# Patient Record
Sex: Female | Born: 1945
Health system: Southern US, Community
[De-identification: ages and names within clinical notes are randomized; demographics above are authoritative.]

## PROBLEM LIST (undated history)

## (undated) DIAGNOSIS — M1990 Unspecified osteoarthritis, unspecified site: Secondary | ICD-10-CM

## (undated) DIAGNOSIS — T8859XA Other complications of anesthesia, initial encounter: Secondary | ICD-10-CM

## (undated) DIAGNOSIS — G4730 Sleep apnea, unspecified: Secondary | ICD-10-CM

## (undated) DIAGNOSIS — F419 Anxiety disorder, unspecified: Secondary | ICD-10-CM

## (undated) DIAGNOSIS — N183 Chronic kidney disease, stage 3 unspecified: Secondary | ICD-10-CM

## (undated) DIAGNOSIS — I10 Essential (primary) hypertension: Secondary | ICD-10-CM

## (undated) DIAGNOSIS — Z923 Personal history of irradiation: Secondary | ICD-10-CM

## (undated) DIAGNOSIS — M545 Low back pain, unspecified: Secondary | ICD-10-CM

## (undated) DIAGNOSIS — M109 Gout, unspecified: Secondary | ICD-10-CM

## (undated) DIAGNOSIS — K219 Gastro-esophageal reflux disease without esophagitis: Secondary | ICD-10-CM

## (undated) DIAGNOSIS — H409 Unspecified glaucoma: Secondary | ICD-10-CM

## (undated) DIAGNOSIS — E785 Hyperlipidemia, unspecified: Secondary | ICD-10-CM

## (undated) DIAGNOSIS — R82998 Other abnormal findings in urine: Secondary | ICD-10-CM

## (undated) DIAGNOSIS — T4145XA Adverse effect of unspecified anesthetic, initial encounter: Secondary | ICD-10-CM

## (undated) DIAGNOSIS — E119 Type 2 diabetes mellitus without complications: Secondary | ICD-10-CM

## (undated) DIAGNOSIS — Z87442 Personal history of urinary calculi: Secondary | ICD-10-CM

## (undated) DIAGNOSIS — Z9289 Personal history of other medical treatment: Secondary | ICD-10-CM

## (undated) DIAGNOSIS — L0390 Cellulitis, unspecified: Secondary | ICD-10-CM

## (undated) HISTORY — DX: Personal history of irradiation: Z92.3

## (undated) HISTORY — PX: KIDNEY STONE SURGERY: SHX686

---

## 1970-04-01 HISTORY — PX: APPENDECTOMY: SHX54

## 1970-04-01 HISTORY — PX: CHOLECYSTECTOMY: SHX55

## 1998-12-25 ENCOUNTER — Encounter: Admission: RE | Admit: 1998-12-25 | Discharge: 1999-03-25 | Payer: Self-pay | Admitting: Family Medicine

## 2000-03-21 ENCOUNTER — Encounter: Admission: RE | Admit: 2000-03-21 | Discharge: 2000-06-19 | Payer: Self-pay | Admitting: Family Medicine

## 2003-02-03 ENCOUNTER — Ambulatory Visit (HOSPITAL_COMMUNITY): Admission: RE | Admit: 2003-02-03 | Discharge: 2003-02-03 | Payer: Self-pay | Admitting: Gastroenterology

## 2003-02-03 ENCOUNTER — Encounter (INDEPENDENT_AMBULATORY_CARE_PROVIDER_SITE_OTHER): Payer: Self-pay | Admitting: *Deleted

## 2003-11-29 ENCOUNTER — Other Ambulatory Visit: Admission: RE | Admit: 2003-11-29 | Discharge: 2003-11-29 | Payer: Self-pay | Admitting: Family Medicine

## 2005-01-16 ENCOUNTER — Other Ambulatory Visit: Admission: RE | Admit: 2005-01-16 | Discharge: 2005-01-16 | Payer: Self-pay | Admitting: Family Medicine

## 2007-02-02 ENCOUNTER — Other Ambulatory Visit: Admission: RE | Admit: 2007-02-02 | Discharge: 2007-02-02 | Payer: Self-pay | Admitting: Family Medicine

## 2010-01-11 ENCOUNTER — Encounter: Admission: RE | Admit: 2010-01-11 | Discharge: 2010-01-11 | Payer: Self-pay | Admitting: Family Medicine

## 2010-01-15 ENCOUNTER — Encounter: Admission: RE | Admit: 2010-01-15 | Discharge: 2010-01-15 | Payer: Self-pay | Admitting: Family Medicine

## 2010-01-26 ENCOUNTER — Encounter: Admission: RE | Admit: 2010-01-26 | Discharge: 2010-01-26 | Payer: Self-pay | Admitting: Family Medicine

## 2010-08-17 NOTE — Op Note (Signed)
NAME:  Elizabeth Crosby, Elizabeth Crosby                          ACCOUNT NO.:  [id number]   MEDICAL RECORD NO.:  [id number]                   PATIENT TYPE:  AMB   LOCATION:  ENDO                                 FACILITY:  [facility]   PHYSICIAN:  Earle Gell, M.D.                DATE OF BIRTH:  June 16, 1945   DATE OF PROCEDURE:  02/03/2003  DATE OF DISCHARGE:                                 OPERATIVE REPORT   PROCEDURE PERFORMED:  Screening colonoscopy.   ENDOSCOPIST:  Garlan Fair, M.D.   INDICATIONS FOR PROCEDURE:  Ms. [name] is a 65 year old female born  Dec 21, 1945.  Ms. [name] is scheduled to undergo her first screening  colonoscopy with polypectomy to prevent colon cancer.   PREMEDICATION:  Versed 7.5 mg, Demerol 75 mg.   DESCRIPTION OF PROCEDURE:  After obtaining informed consent, Ms. [name] was  placed in the left lateral decubitus position.  I administered intravenous  Demerol and intravenous Versed to achieve conscious sedation for the  procedure.  The patient's blood pressure, oxygen saturations and cardiac  rhythm were monitored throughout the procedure and documented in the medical  record.   Anal inspection was normal.  Digital rectal exam was normal.  The Olympus  adjustable pediatric colonoscope was introduced into the rectum and advanced  to the cecum.  Colonic preparation for the exam today was excellent.   Rectum:  Normal.   Sigmoid colon and descending colon:  From the distal sigmoid colon at  approximately 20 to 25 cm from the anal verge, a 2 cm pedunculated polyp was  removed with the electrocautery snare and two 1 mm sessile polyps were  removed with the hot biopsy forceps.  At the pedunculated polypectomy site,  two Endoclips were applied to prevent bleeding.   Splenic flexure:  Normal.   Transverse colon:  Normal.   Hepatic flexure:  Normal.   Ascending colon:  Normal.   Cecum and ileocecal valve:  From the proximal cecum, adjacent to the  appendiceal orifice, a 2 to 3 mm sessile polyp was removed with the  electrocautery snare and submitted for pathological interpretation.    ASSESSMENT:  1. From the proximal cecum, a 2 to 3 mm sessile polyp was removed with     electrocautery snare.  2. From the distal sigmoid colon, a large pedunculated polyp was removed     with the electrocautery snare and two small sessile polyps were removed     with the hot biopsy forceps.                                               Earle Gell, M.D.    MJ/MEDQ  D:  02/03/2003  T:  02/03/2003  Job:  [id number]   cc:   Lenon Curt  Roderick Pee, M.D.  [address] [place]  [place] [address]  Fax: [phone]

## 2010-09-04 ENCOUNTER — Ambulatory Visit: Payer: Self-pay | Admitting: Physical Therapy

## 2010-12-17 ENCOUNTER — Other Ambulatory Visit: Payer: Self-pay | Admitting: Family Medicine

## 2010-12-17 ENCOUNTER — Ambulatory Visit
Admission: RE | Admit: 2010-12-17 | Discharge: 2010-12-17 | Disposition: A | Discharge status: Home / Self Care | Payer: Managed Care, Other (non HMO) | Source: Ambulatory Visit | Attending: Family Medicine | Admitting: Family Medicine

## 2010-12-17 DIAGNOSIS — M25579 Pain in unspecified ankle and joints of unspecified foot: Secondary | ICD-10-CM

## 2011-05-16 DIAGNOSIS — E782 Mixed hyperlipidemia: Secondary | ICD-10-CM | POA: Diagnosis not present

## 2011-05-16 DIAGNOSIS — R252 Cramp and spasm: Secondary | ICD-10-CM | POA: Diagnosis not present

## 2011-05-16 DIAGNOSIS — E119 Type 2 diabetes mellitus without complications: Secondary | ICD-10-CM | POA: Diagnosis not present

## 2011-05-20 DIAGNOSIS — S93409A Sprain of unspecified ligament of unspecified ankle, initial encounter: Secondary | ICD-10-CM | POA: Diagnosis not present

## 2011-06-04 DIAGNOSIS — J309 Allergic rhinitis, unspecified: Secondary | ICD-10-CM | POA: Diagnosis not present

## 2011-08-23 DIAGNOSIS — J209 Acute bronchitis, unspecified: Secondary | ICD-10-CM | POA: Diagnosis not present

## 2011-11-11 DIAGNOSIS — E782 Mixed hyperlipidemia: Secondary | ICD-10-CM | POA: Diagnosis not present

## 2011-11-11 DIAGNOSIS — IMO0001 Reserved for inherently not codable concepts without codable children: Secondary | ICD-10-CM | POA: Diagnosis not present

## 2011-11-11 DIAGNOSIS — I10 Essential (primary) hypertension: Secondary | ICD-10-CM | POA: Diagnosis not present

## 2011-12-10 DIAGNOSIS — J209 Acute bronchitis, unspecified: Secondary | ICD-10-CM | POA: Diagnosis not present

## 2012-02-17 DIAGNOSIS — Z1331 Encounter for screening for depression: Secondary | ICD-10-CM | POA: Diagnosis not present

## 2012-02-17 DIAGNOSIS — E1129 Type 2 diabetes mellitus with other diabetic kidney complication: Secondary | ICD-10-CM | POA: Diagnosis not present

## 2012-02-17 DIAGNOSIS — R809 Proteinuria, unspecified: Secondary | ICD-10-CM | POA: Diagnosis not present

## 2012-02-17 DIAGNOSIS — R071 Chest pain on breathing: Secondary | ICD-10-CM | POA: Diagnosis not present

## 2012-02-17 DIAGNOSIS — N183 Chronic kidney disease, stage 3 unspecified: Secondary | ICD-10-CM | POA: Diagnosis not present

## 2012-07-22 ENCOUNTER — Other Ambulatory Visit: Payer: Self-pay | Admitting: Gastroenterology

## 2012-08-13 ENCOUNTER — Encounter (HOSPITAL_COMMUNITY): Payer: Self-pay | Admitting: *Deleted

## 2012-08-17 ENCOUNTER — Encounter (HOSPITAL_COMMUNITY): Payer: Self-pay | Admitting: Pharmacy Technician

## 2012-08-18 DIAGNOSIS — R350 Frequency of micturition: Secondary | ICD-10-CM | POA: Diagnosis not present

## 2012-09-01 ENCOUNTER — Encounter (HOSPITAL_COMMUNITY): Payer: Self-pay | Admitting: Anesthesiology

## 2012-09-01 ENCOUNTER — Ambulatory Visit (HOSPITAL_COMMUNITY): Payer: BC Managed Care – PPO | Admitting: Anesthesiology

## 2012-09-01 ENCOUNTER — Encounter (HOSPITAL_COMMUNITY): Payer: Self-pay

## 2012-09-01 ENCOUNTER — Encounter (HOSPITAL_COMMUNITY)
Admission: RE | Disposition: A | Discharge status: Home / Self Care | Payer: Self-pay | Source: Ambulatory Visit | Attending: Gastroenterology

## 2012-09-01 ENCOUNTER — Ambulatory Visit (HOSPITAL_COMMUNITY)
Admission: RE | Admit: 2012-09-01 | Discharge: 2012-09-01 | Disposition: A | Discharge status: Home / Self Care | Payer: BC Managed Care – PPO | Source: Ambulatory Visit | Attending: Gastroenterology | Admitting: Gastroenterology

## 2012-09-01 DIAGNOSIS — R197 Diarrhea, unspecified: Secondary | ICD-10-CM | POA: Diagnosis not present

## 2012-09-01 DIAGNOSIS — G4733 Obstructive sleep apnea (adult) (pediatric): Secondary | ICD-10-CM | POA: Insufficient documentation

## 2012-09-01 DIAGNOSIS — D126 Benign neoplasm of colon, unspecified: Secondary | ICD-10-CM | POA: Diagnosis not present

## 2012-09-01 DIAGNOSIS — Z79899 Other long term (current) drug therapy: Secondary | ICD-10-CM | POA: Diagnosis not present

## 2012-09-01 DIAGNOSIS — E119 Type 2 diabetes mellitus without complications: Secondary | ICD-10-CM | POA: Insufficient documentation

## 2012-09-01 DIAGNOSIS — K5730 Diverticulosis of large intestine without perforation or abscess without bleeding: Secondary | ICD-10-CM | POA: Insufficient documentation

## 2012-09-01 DIAGNOSIS — I10 Essential (primary) hypertension: Secondary | ICD-10-CM | POA: Insufficient documentation

## 2012-09-01 DIAGNOSIS — E7800 Pure hypercholesterolemia, unspecified: Secondary | ICD-10-CM | POA: Diagnosis not present

## 2012-09-01 DIAGNOSIS — K219 Gastro-esophageal reflux disease without esophagitis: Secondary | ICD-10-CM | POA: Insufficient documentation

## 2012-09-01 HISTORY — DX: Personal history of other medical treatment: Z92.89

## 2012-09-01 HISTORY — DX: Type 2 diabetes mellitus without complications: E11.9

## 2012-09-01 HISTORY — DX: Gastro-esophageal reflux disease without esophagitis: K21.9

## 2012-09-01 HISTORY — PX: COLONOSCOPY WITH PROPOFOL: SHX5780

## 2012-09-01 HISTORY — DX: Essential (primary) hypertension: I10

## 2012-09-01 SURGERY — COLONOSCOPY WITH PROPOFOL
Anesthesia: Monitor Anesthesia Care

## 2012-09-01 MED ORDER — SODIUM CHLORIDE 0.9 % IV SOLN
INTRAVENOUS | Status: DC
  Administered 2012-09-01: 1000 mL via INTRAVENOUS

## 2012-09-01 MED ORDER — KETAMINE HCL 50 MG/ML IJ SOLN
INTRAMUSCULAR | Status: DC | PRN
  Administered 2012-09-01: 10 mg via INTRAMUSCULAR
  Administered 2012-09-01: 15 mg via INTRAMUSCULAR

## 2012-09-01 MED ORDER — PROPOFOL INFUSION 10 MG/ML OPTIME
INTRAVENOUS | Status: DC | PRN
  Administered 2012-09-01: 75 ug/kg/min via INTRAVENOUS

## 2012-09-01 MED ORDER — PROPOFOL 10 MG/ML IV BOLUS
INTRAVENOUS | Status: DC | PRN
  Administered 2012-09-01 (×4): 20 mg via INTRAVENOUS

## 2012-09-01 SURGICAL SUPPLY — 21 items

## 2012-09-01 NOTE — Anesthesia Preprocedure Evaluation (Addendum)
Anesthesia Evaluation  Patient identified by MRN, date of birth, ID band Patient awake    Reviewed: Allergy & Precautions, H&P , NPO status , Patient's Chart, lab work & pertinent test results  Airway Mallampati: II TM Distance: >3 FB Neck ROM: Full    Dental  (+) Teeth Intact, Caps and Dental Advisory Given   Pulmonary neg pulmonary ROS,  breath sounds clear to auscultation        Cardiovascular hypertension, Pt. on medications negative cardio ROS  Rhythm:Regular Rate:Normal + Systolic murmurs    Neuro/Psych negative neurological ROS  negative psych ROS   GI/Hepatic negative GI ROS, Neg liver ROS, GERD-  Medicated,  Endo/Other  negative endocrine ROSdiabetes, Type 2, Insulin Dependent and Oral Hypoglycemic Agents  Renal/GU negative Renal ROS  negative genitourinary   Musculoskeletal negative musculoskeletal ROS (+)   Abdominal   Peds negative pediatric ROS (+)  Hematology negative hematology ROS (+)   Anesthesia Other Findings   Reproductive/Obstetrics negative OB ROS                          Anesthesia Physical Anesthesia Plan  ASA: III  Anesthesia Plan: MAC   Post-op Pain Management:    Induction: Intravenous  Airway Management Planned: Simple Face Mask  Additional Equipment:   Intra-op Plan:   Post-operative Plan:   Informed Consent: I have reviewed the patients History and Physical, chart, labs and discussed the procedure including the risks, benefits and alternatives for the proposed anesthesia with the patient or authorized representative who has indicated his/her understanding and acceptance.   Dental advisory given  Plan Discussed with: CRNA  Anesthesia Plan Comments:         Anesthesia Quick Evaluation

## 2012-09-01 NOTE — Op Note (Signed)
Procedure: Surveillance colonoscopy with random colon biopsies to rule out microscopic colitis to evaluate chronic diarrhea on metformin. A  1.5 cm tubulovillous adenomatous polyp removed from the sigmoid colon 2004.  Endoscopist: Earle Gell  Premedication: Propofol administered by anesthesia  Procedure: The patient was placed in the left lateral decubitus position. Anal inspection and digital rectal exam were normal. The Pentax pediatric colonoscope was introduced into the rectum and advanced to the cecum. A normal-appearing ileocecal valve and appendiceal orifice were identified. Colonic preparation for the exam today was good.  Rectum. Normal. Retroflex view of the distal rectum normal.  Sigmoid colon and descending colon. Left colonic diverticulosis. From the proximal descending colon, a 4 mm sessile polyp was removed with the cold snare and cold biopsy forceps and submitted for pathologic interpretation.  Splenic flexure. Normal.  Transverse colon. Normal.  Hepatic flexure. Normal.  Ascending colon. Normal.  Cecum and ileocecal valve. Normal.  Random colon biopsies. Biopsies were performed from the right colon and left colon to look for microscopic colitis.  Assessment:  #1. From the proximal descending colon, a 4 mm sessile polyp was removed with the cold snare and cold biopsy forceps.  #2. Random colon biopsies were performed from the right colon and left colon to rule out microscopic colitis.  #3. Schedule repeat surveillance colonoscopy in 5 years.

## 2012-09-01 NOTE — H&P (Signed)
  Procedure: Surveillance colonoscopy.  History: The patient is a 67 year old female born 08-11-1945. In November 2004, a 1.5 cm tubulovillous adenomatous polyp was removed from the sigmoid colon colonoscopically. In December 2008, the patient underwent a normal surveillance colonoscopy.  The patient is scheduled to undergo a surveillance colonoscopy today.  The patient has intermittent watery, nonbloody diarrhea. I will perform random colon biopsies to rule out microscopic colitis.  Chronic medications: Atorvastatin. Metformin. Lantus insulin. NovoLog insulin. Ventolin. Zyrtec. Fluticasone. Fenofibrate. Omeprazole. Losartan. Hydrochlorothiazide. Aspirin. Amlodipine.  Past medical and surgical history: Appendectomy. Cholecystectomy. Type 2 diabetes mellitus with proteinuria. Hypertension. Hypercholesterolemia. Gastroesophageal reflux. Obesity. Anxiety. Glaucoma. Obstructive sleep apnea syndrome.  Medication allergies: Sulfa. Lipitor. Lexapro.  Exam: The patient is alert and lying comfortably on the endoscopy stretcher. Abdomen is soft and nontender to palpation. Lungs are clear to auscultation. Cardiac exam reveals a regular rhythm.  Plan: Proceed with surveillance colonoscopy.

## 2012-09-01 NOTE — Transfer of Care (Signed)
Immediate Anesthesia Transfer of Care Note  Patient: Elizabeth Crosby  Procedure(s) Performed: Procedure(s) (LRB): COLONOSCOPY WITH PROPOFOL (N/A)  Patient Location: PACU  Anesthesia Type: MAC  Level of Consciousness: sedated, patient cooperative and responds to stimulaton  Airway & Oxygen Therapy: Patient Spontanous Breathing and Patient connected to face mask oxgen  Post-op Assessment: Report given to PACU RN and Post -op Vital signs reviewed and stable  Post vital signs: Reviewed and stable  Complications: No apparent anesthesia complications

## 2012-09-02 ENCOUNTER — Encounter (HOSPITAL_COMMUNITY): Payer: Self-pay | Admitting: Gastroenterology

## 2012-09-02 NOTE — Anesthesia Postprocedure Evaluation (Signed)
Anesthesia Post Note  Patient: Elizabeth Crosby  Procedure(s) Performed: Procedure(s) (LRB): COLONOSCOPY WITH PROPOFOL (N/A)  Anesthesia type: MAC  Patient location: PACU  Post pain: Pain level controlled  Post assessment: Post-op Vital signs reviewed  Last Vitals:  Filed Vitals:   09/01/12 1030  BP: 134/62  Temp:   Resp: 14    Post vital signs: Reviewed  Level of consciousness: sedated  Complications: No apparent anesthesia complications

## 2012-12-17 DIAGNOSIS — E782 Mixed hyperlipidemia: Secondary | ICD-10-CM | POA: Diagnosis not present

## 2012-12-17 DIAGNOSIS — N183 Chronic kidney disease, stage 3 unspecified: Secondary | ICD-10-CM | POA: Diagnosis not present

## 2012-12-17 DIAGNOSIS — I129 Hypertensive chronic kidney disease with stage 1 through stage 4 chronic kidney disease, or unspecified chronic kidney disease: Secondary | ICD-10-CM | POA: Diagnosis not present

## 2012-12-17 DIAGNOSIS — Z6841 Body Mass Index (BMI) 40.0 and over, adult: Secondary | ICD-10-CM | POA: Diagnosis not present

## 2012-12-17 DIAGNOSIS — F411 Generalized anxiety disorder: Secondary | ICD-10-CM | POA: Diagnosis not present

## 2012-12-17 DIAGNOSIS — E1129 Type 2 diabetes mellitus with other diabetic kidney complication: Secondary | ICD-10-CM | POA: Diagnosis not present

## 2012-12-17 DIAGNOSIS — G5600 Carpal tunnel syndrome, unspecified upper limb: Secondary | ICD-10-CM | POA: Diagnosis not present

## 2012-12-30 DIAGNOSIS — Z23 Encounter for immunization: Secondary | ICD-10-CM | POA: Diagnosis not present

## 2013-02-09 ENCOUNTER — Other Ambulatory Visit: Payer: Self-pay | Admitting: Physician Assistant

## 2013-02-09 ENCOUNTER — Ambulatory Visit
Admission: RE | Admit: 2013-02-09 | Discharge: 2013-02-09 | Disposition: A | Discharge status: Home / Self Care | Payer: BC Managed Care – PPO | Source: Ambulatory Visit | Attending: Physician Assistant | Admitting: Physician Assistant

## 2013-02-09 DIAGNOSIS — J40 Bronchitis, not specified as acute or chronic: Secondary | ICD-10-CM | POA: Diagnosis not present

## 2013-02-09 DIAGNOSIS — R05 Cough: Secondary | ICD-10-CM | POA: Diagnosis not present

## 2013-02-09 DIAGNOSIS — R059 Cough, unspecified: Secondary | ICD-10-CM

## 2013-05-06 ENCOUNTER — Encounter: Payer: Self-pay | Admitting: Internal Medicine

## 2013-05-06 ENCOUNTER — Ambulatory Visit (INDEPENDENT_AMBULATORY_CARE_PROVIDER_SITE_OTHER): Payer: BC Managed Care – PPO | Admitting: Internal Medicine

## 2013-05-06 ENCOUNTER — Other Ambulatory Visit: Payer: BC Managed Care – PPO

## 2013-05-06 VITALS — BP 126/78 | HR 91 | Ht 62.0 in | Wt 230.6 lb

## 2013-05-06 DIAGNOSIS — J40 Bronchitis, not specified as acute or chronic: Secondary | ICD-10-CM | POA: Diagnosis not present

## 2013-05-06 DIAGNOSIS — J209 Acute bronchitis, unspecified: Secondary | ICD-10-CM | POA: Diagnosis not present

## 2013-05-06 DIAGNOSIS — K219 Gastro-esophageal reflux disease without esophagitis: Secondary | ICD-10-CM

## 2013-05-06 MED ORDER — FLUTICASONE FUROATE-VILANTEROL 100-25 MCG/INH IN AEPB: 1.0000 | INHALATION_SPRAY | Freq: Once | RESPIRATORY_TRACT | Status: DC

## 2013-05-06 NOTE — Progress Notes (Signed)
05/06/13- 59 yoF never smoker referred courtesy of Dr Earle Gell to rule out asthmatic bronchitis Patient describes bronchitis almost every year. This past year has been unusual in that cough with bronchitis began in November and has persisted. Describes dry cough with little wheeze. She took Z-Pak and amoxicillin in December. No fever or purulent sputum. Takes Zyrtec every day around the year for sneezing and watery eyes. Albuterol inhaler not much help. Dyspnea on exertion with hills and stairs. Never diagnosed asthma. Has had pneumonia and pneumonia vaccine.  Much reflux in the past now treated with omeprazole twice daily. Dr. Wynetta Emery does not feel reflux is likely to be causing the cough. Dexilant poorly tolerated. 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 CXR 02/09/13 IMPRESSION:  Slight bronchitic changes.  Electronically Signed  By: Rozetta Nunnery M.D.  On: 02/09/2013 15:49  Prior to Admission medications   Medication Sig Start Date End Date Taking? Authorizing Provider  acetaminophen (TYLENOL) 500 MG tablet Take 1,000 mg by mouth every 6 (six) hours as needed for pain.   Yes Historical Provider, MD  amLODipine (NORVASC) 5 MG tablet Take 5 mg by mouth every morning.    Yes Historical Provider, MD  aspirin EC 81 MG tablet Take 81 mg by mouth every morning.   Yes Historical Provider, MD  atorvastatin (LIPITOR) 80 MG tablet Take 80 mg by mouth every morning.   Yes Historical Provider, MD  Canagliflozin (INVOKANA) 300 MG TABS Take 300 mg by mouth every morning.    Yes Historical Provider, MD  cetirizine (ZYRTEC) 10 MG tablet Take 10 mg by mouth at bedtime.   Yes Historical Provider, MD  Cholecalciferol (VITAMIN D3) 2000 UNITS TABS Take 2,000 Units by mouth every morning.   Yes Historical Provider, MD  diazepam (VALIUM) 2 MG tablet Take 2 mg by mouth every 8 (eight) hours as needed for anxiety.   Yes Historical Provider, MD  fenofibrate 160 MG  tablet Take 160 mg by mouth every morning.    Yes Historical Provider, MD  insulin aspart (NOVOLOG) 100 UNIT/ML injection Inject into the skin 3 (three) times daily before meals. Sliding scale based on 1/3 of toal carbs eaten with meals   Yes Historical Provider, MD  insulin glargine (LANTUS) 100 UNIT/ML injection Inject 40 Units into the skin at bedtime.   Yes Historical Provider, MD  Liraglutide (VICTOZA) 18 MG/3ML SOPN Inject 0.6 Units into the skin daily.   Yes Historical Provider, MD  losartan-hydrochlorothiazide (HYZAAR) 100-25 MG per tablet Take 0.5 tablets by mouth every morning.   Yes Historical Provider, MD  metFORMIN (GLUCOPHAGE) 850 MG tablet Take 850 mg by mouth daily with breakfast.    Yes Historical Provider, MD  Multiple Vitamin (MULTIVITAMIN) tablet Take 1 tablet by mouth every morning.    Yes Historical Provider, MD  omeprazole (PRILOSEC) 20 MG capsule Take 20 mg by mouth 2 (two) times daily.   Yes Historical Provider, MD  Polyethyl Glycol-Propyl Glycol (SYSTANE) 0.4-0.3 % SOLN Place 1 drop into both eyes daily as needed (For dry eyes.).   Yes Historical Provider, MD  SYMBICORT 80-4.5 MCG/ACT inhaler Inhale 2 puffs into the lungs 2 (two) times daily. 03/03/13  Yes Historical Provider, MD  vitamin C (ASCORBIC ACID) 500 MG tablet Take 500 mg by mouth every morning.    Yes Historical Provider, MD  albuterol (PROVENTIL HFA;VENTOLIN HFA) 108 (90 BASE) MCG/ACT inhaler Inhale 2 puffs into the lungs every 4 (four) hours  as needed for wheezing.    Historical Provider, MD  Fluticasone Furoate-Vilanterol (BREO ELLIPTA) 100-25 MCG/INH AEPB Inhale 1 puff into the lungs once. 05/06/13   Elizabeth Lever, MD   Past Medical History  Diagnosis Date  . Hypertension   . GERD (gastroesophageal reflux disease)   . History of blood transfusion     as child after accident, after childbirth/hemorroid surgery  . Diabetes mellitus without complication    Past Surgical History  Procedure Laterality Date   . Cholecystectomy    . Appendectomy    . Colonoscopy with propofol N/A 09/01/2012    Procedure: COLONOSCOPY WITH PROPOFOL;  Surgeon: Garlan Fair, MD;  Location: [facility];  Service: Endoscopy;  Laterality: N/A;   Family History  Problem Relation Age of Onset  . Stroke Mother   . Asthma Father   . Colon cancer Father    History   Social History  . Marital Status: Married    Spouse Name: N/A    Number of Children: N/A  . Years of Education: N/A   Occupational History  . Not on file.   Social History Main Topics  . Smoking status: Never Smoker   . Smokeless tobacco: Never Used  . Alcohol Use: No  . Drug Use: No  . Sexual Activity: Not on file   Other Topics Concern  . Not on file   Social History Narrative  . No narrative on file   ROS-see HPI Constitutional:   No-   weight loss, night sweats, fevers, chills, fatigue, lassitude. HEENT:   No-  headaches, difficulty swallowing, tooth/dental problems, sore throat,       No-  sneezing, itching, ear ache, nasal congestion, post nasal drip,  CV:  No-   chest pain, orthopnea, PND, swelling in lower extremities, anasarca,                                  dizziness, palpitations Resp: +  shortness of breath with exertion or at rest.              + productive cough,  + non-productive cough,  No- coughing up of blood.              No-   change in color of mucus.  No- wheezing.   Skin: No-   rash or lesions. GI:  +  heartburn, +indigestion, no-abdominal pain, nausea, vomiting, diarrhea,                 change in bowel habits, loss of appetite GU: No-   dysuria, change in color of urine, no urgency or frequency.  No- flank pain. MS:  + joint pain or swelling.  No- decreased range of motion.  No- back pain. Neuro-     nothing unusual Psych:  No- change in mood or affect. No depression or anxiety.  No memory loss.  OBJ- Physical Exam General- Alert, Oriented, Affect-appropriate, Distress- none acute, overweight Skin-  rash-none, lesions- none, excoriation- none Lymphadenopathy- none Head- atraumatic            Eyes- Gross vision intact, PERRLA, conjunctivae and secretions clear            Ears- Hearing, canals-normal            Nose- Clear, no-Septal dev, mucus, polyps, erosion, perforation             Throat- Mallampati III , mucosa  clear , drainage- none, tonsils- atrophic Neck- flexible , trachea midline, no stridor , thyroid nl, carotid no bruit Chest - symmetrical excursion , unlabored           Heart/CV- RRR , no murmur heard today , no gallop  , no rub, nl s1 s2                           - JVD- none , edema- none, stasis changes- none, varices- none           Lung- clear to P&A, wheeze- none, cough+dry with deep breath , dullness-none, rub- none           Chest wall-  Abd- tender-no, distended-no, bowel sounds-present, HSM- no Br/ Gen/ Rectal- Not done, not indicated Extrem- cyanosis- none, clubbing, none, atrophy- none, strength- nl Neuro- grossly intact to observation

## 2013-05-06 NOTE — Patient Instructions (Signed)
Order- schedule PFT dx Bronchitis  Order lab- Allergy profile  Sample Breo Ellipta inhaler  1 puff, then rinse mouth, twice daily

## 2013-05-07 LAB — ALLERGY FULL PROFILE
Allergen, D pternoyssinus,d7: <0.1 kU/L
Bahia Grass: <0.1 kU/L
Box Elder IgE: <0.1 kU/L
Cat Dander: 0.22 kU/L — ABNORMAL HIGH
Common Ragweed: <0.1 kU/L
Dog Dander: <0.1 kU/L
G005 Rye, Perennial: <0.1 kU/L
Goldenrod: <0.1 kU/L
Helminthosporium halodes: <0.1 kU/L
House Dust Hollister: <0.1 kU/L
IGE (IMMUNOGLOBULIN E), SERUM: 14.5 [IU]/mL (ref 0.0–180.0)
Sycamore Tree: <0.1 kU/L

## 2013-05-29 ENCOUNTER — Encounter: Payer: Self-pay | Admitting: Internal Medicine

## 2013-05-29 DIAGNOSIS — K219 Gastro-esophageal reflux disease without esophagitis: Secondary | ICD-10-CM | POA: Insufficient documentation

## 2013-05-29 DIAGNOSIS — J209 Acute bronchitis, unspecified: Secondary | ICD-10-CM | POA: Insufficient documentation

## 2013-05-29 NOTE — Assessment & Plan Note (Signed)
History of recurrent acute bronchitis, more persistent this year. Known reflux symptomatically controlled. Relation to cough not clear. Plan-sample Breo Ellipta, schedule PFT, lab allergy profile

## 2013-05-29 NOTE — Assessment & Plan Note (Signed)
controlled 

## 2013-06-17 ENCOUNTER — Ambulatory Visit (INDEPENDENT_AMBULATORY_CARE_PROVIDER_SITE_OTHER): Payer: BC Managed Care – PPO | Admitting: Internal Medicine

## 2013-06-17 ENCOUNTER — Encounter: Payer: Self-pay | Admitting: Internal Medicine

## 2013-06-17 VITALS — BP 140/72 | HR 102 | Ht 61.0 in | Wt 232.0 lb

## 2013-06-17 DIAGNOSIS — J40 Bronchitis, not specified as acute or chronic: Secondary | ICD-10-CM | POA: Diagnosis not present

## 2013-06-17 DIAGNOSIS — J00 Acute nasopharyngitis [common cold]: Secondary | ICD-10-CM

## 2013-06-17 DIAGNOSIS — J209 Acute bronchitis, unspecified: Secondary | ICD-10-CM | POA: Diagnosis not present

## 2013-06-17 LAB — PULMONARY FUNCTION TEST
DL/VA % pred: 127 %
DL/VA: 5.63 ml/min/mmHg/L
DLCO UNC: 17.27 ml/min/mmHg
DLCO unc % pred: 85 %
FEF 25-75 Post: 2.98 L/sec
FEF 25-75 Pre: 3.1 L/sec
FEF2575-%Change-Post: -3 %
FEF2575-%Pred-Post: 160 %
FEF2575-%Pred-Pre: 167 %
FEV1-%Change-Post: 2 %
FEV1-%PRED-POST: 90 %
FEV1-%Pred-Pre: 88 %
FEV1-POST: 1.88 L
FEV1-Pre: 1.82 L
FEV1FVC-%CHANGE-POST: 2 %
FEV1FVC-%Pred-Pre: 117 %
FEV6-%Change-Post: 0 %
FEV6-%PRED-PRE: 78 %
FEV6-%Pred-Post: 79 %
FEV6-POST: 2.06 L
FEV6-PRE: 2.04 L
FEV6FVC-%CHANGE-POST: 0 %
FEV6FVC-%PRED-POST: 104 %
FEV6FVC-%PRED-PRE: 103 %
FVC-%Change-Post: 0 %
FVC-%PRED-PRE: 75 %
FVC-%Pred-Post: 75 %
FVC-PRE: 2.05 L
FVC-Post: 2.06 L
POST FEV6/FVC RATIO: 100 %
PRE FEV6/FVC RATIO: 100 %
Post FEV1/FVC ratio: 91 %
Pre FEV1/FVC ratio: 89 %
RV % PRED: 54 %
RV: 1.09 L
TLC % PRED: 72 %
TLC: 3.36 L

## 2013-06-17 MED ORDER — ALBUTEROL SULFATE HFA 108 (90 BASE) MCG/ACT IN AERS: 2.0000 | INHALATION_SPRAY | RESPIRATORY_TRACT | Status: DC | PRN

## 2013-06-17 NOTE — Patient Instructions (Signed)
Ok to take an otc antihistamine for allergic nose symptoms if needed  Script sent for albuterol rescue inhaler to use if needed  Ok to continue Symbicort while needed  Consider the environmental dust and pollen precaution information we gave you, including possibly trying a HEPA filter air-cleaner in your bedroom.

## 2013-06-17 NOTE — Progress Notes (Signed)
PFT done today. 

## 2013-06-17 NOTE — Progress Notes (Signed)
05/06/13- 40 yoF never smoker referred courtesy of Dr Earle Gell to rule out asthmatic bronchitis Patient describes bronchitis almost every year. This past year has been unusual in that cough with bronchitis began in November and has persisted. Describes dry cough with little wheeze. She took Z-Pak and amoxicillin in December. No fever or purulent sputum. Takes Zyrtec every day around the year for sneezing and watery eyes. Albuterol inhaler not much help. Dyspnea on exertion with hills and stairs. Never diagnosed asthma. Has had pneumonia and pneumonia vaccine.  Much reflux in the past now treated with omeprazole twice daily. Dr. Wynetta Emery does not feel reflux is likely to be causing the cough. Dexilant poorly tolerated. 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 CXR 02/09/13 IMPRESSION:  Slight bronchitic changes.  Electronically Signed  By: Rozetta Nunnery M.D.  On: 02/09/2013 15:49  06/17/13- 54 yoF never smoker followed for recurrent bronchitis, complicated by GERD FOLLOWS FOR: review PFT and allergy lab results with patient. Pollen causing spring allergy complaints with watery eyes, periorbital edema, sneezing, minor cough. Allergy Profile 05/06/13- total IgE 14.5, elevation only for cat (has 2 cats) PFT: 06/17/2013- minimal restriction, normal flows within significant response to bronchodilator, normal diffusion. FEC 2.06/75%, FEV1 1.88/90%, FEV1/FVC 0.91, FEF 25-75% 2.98/160%, TLC 72%, DLCO 85%.  ROS-see HPI Constitutional:   No-   weight loss, night sweats, fevers, chills, fatigue, lassitude. HEENT:   No-  headaches, difficulty swallowing, tooth/dental problems, sore throat,       + sneezing, itching, ear ache, +nasal congestion, post nasal drip,  CV:  No-   chest pain, orthopnea, PND, swelling in lower extremities, anasarca,                                  dizziness, palpitations Resp: +  shortness of breath with exertion or at rest.         No- productive cough,  + non-productive cough,  No- coughing up of blood.              No-   change in color of mucus.  No- wheezing.   Skin: No-   rash or lesions. GI:  +  heartburn, no-indigestion, no-abdominal pain, nausea, vomiting,  GU: No-   dysuria, change in color of urine, no urgency or frequency.  No- flank pain. MS:  + joint pain or swelling.  No- decreased range of motion.  No- back pain. Neuro-     nothing unusual Psych:  No- change in mood or affect. No depression or anxiety.  No memory loss.  OBJ- Physical Exam General- Alert, Oriented, Affect-appropriate, Distress- none acute, +overweight Skin- rash-none, lesions- none, excoriation- none Lymphadenopathy- none Head- atraumatic            Eyes- Gross vision intact, PERRLA, conjunctivae and secretions clear            Ears- Hearing, canals-normal            Nose- Clear, no-Septal dev, mucus, polyps, erosion, perforation             Throat- Mallampati III , mucosa clear , drainage- none, tonsils- atrophic Neck- flexible , trachea midline, no stridor , thyroid nl, carotid no bruit Chest - symmetrical excursion , unlabored           Heart/CV- RRR , no murmur heard today , no gallop  , no rub, nl s1  s2                           - JVD- none , edema- none, stasis changes- none, varices- none           Lung- clear to P&A, wheeze- none, cough+dry with deep breath , dullness-none, rub- none           Chest wall-  Abd-  Br/ Gen/ Rectal- Not done, not indicated Extrem- cyanosis- none, clubbing, none, atrophy- none, +cane Neuro- grossly intact to observation

## 2013-07-04 DIAGNOSIS — J00 Acute nasopharyngitis [common cold]: Secondary | ICD-10-CM | POA: Insufficient documentation

## 2013-07-04 NOTE — Assessment & Plan Note (Signed)
Mild allergic asthmatic bronchitis at most

## 2013-07-04 NOTE — Assessment & Plan Note (Signed)
She has 2 cats but we would expect this to be a perennial trigger. Acute symptoms now suggest she is responding to current tree pollens, although these were not elevated on her allergy profile. Plan-antihistamines, discussed available nasal sprays, consider HEPA air cleaner

## 2013-12-13 ENCOUNTER — Ambulatory Visit: Payer: BC Managed Care – PPO | Admitting: Internal Medicine

## 2013-12-29 DIAGNOSIS — Z6841 Body Mass Index (BMI) 40.0 and over, adult: Secondary | ICD-10-CM | POA: Diagnosis not present

## 2013-12-29 DIAGNOSIS — E782 Mixed hyperlipidemia: Secondary | ICD-10-CM | POA: Diagnosis not present

## 2013-12-29 DIAGNOSIS — N183 Chronic kidney disease, stage 3 unspecified: Secondary | ICD-10-CM | POA: Diagnosis not present

## 2013-12-29 DIAGNOSIS — I129 Hypertensive chronic kidney disease with stage 1 through stage 4 chronic kidney disease, or unspecified chronic kidney disease: Secondary | ICD-10-CM | POA: Diagnosis not present

## 2013-12-29 DIAGNOSIS — F411 Generalized anxiety disorder: Secondary | ICD-10-CM | POA: Diagnosis not present

## 2013-12-29 DIAGNOSIS — R55 Syncope and collapse: Secondary | ICD-10-CM | POA: Diagnosis not present

## 2013-12-29 DIAGNOSIS — Z23 Encounter for immunization: Secondary | ICD-10-CM | POA: Diagnosis not present

## 2013-12-29 DIAGNOSIS — N058 Unspecified nephritic syndrome with other morphologic changes: Secondary | ICD-10-CM | POA: Diagnosis not present

## 2013-12-29 DIAGNOSIS — E1129 Type 2 diabetes mellitus with other diabetic kidney complication: Secondary | ICD-10-CM | POA: Diagnosis not present

## 2013-12-30 ENCOUNTER — Ambulatory Visit (HOSPITAL_COMMUNITY): Payer: BC Managed Care – PPO | Attending: Family Medicine | Admitting: Radiology

## 2013-12-30 ENCOUNTER — Other Ambulatory Visit (HOSPITAL_COMMUNITY): Payer: Self-pay | Admitting: Family Medicine

## 2013-12-30 DIAGNOSIS — I10 Essential (primary) hypertension: Secondary | ICD-10-CM | POA: Insufficient documentation

## 2013-12-30 DIAGNOSIS — R55 Syncope and collapse: Secondary | ICD-10-CM

## 2013-12-30 DIAGNOSIS — E119 Type 2 diabetes mellitus without complications: Secondary | ICD-10-CM | POA: Diagnosis not present

## 2013-12-30 DIAGNOSIS — E785 Hyperlipidemia, unspecified: Secondary | ICD-10-CM | POA: Diagnosis not present

## 2013-12-30 NOTE — Progress Notes (Signed)
Echocardiogram performed.  

## 2014-01-03 DIAGNOSIS — E119 Type 2 diabetes mellitus without complications: Secondary | ICD-10-CM | POA: Diagnosis not present

## 2014-01-03 DIAGNOSIS — H25813 Combined forms of age-related cataract, bilateral: Secondary | ICD-10-CM | POA: Diagnosis not present

## 2014-01-04 ENCOUNTER — Encounter (HOSPITAL_COMMUNITY): Payer: Self-pay | Admitting: Pharmacy Technician

## 2014-01-05 ENCOUNTER — Other Ambulatory Visit: Payer: Self-pay

## 2014-01-05 ENCOUNTER — Encounter (HOSPITAL_COMMUNITY): Payer: Self-pay

## 2014-01-05 ENCOUNTER — Encounter (HOSPITAL_COMMUNITY)
Admission: RE | Admit: 2014-01-05 | Discharge: 2014-01-05 | Disposition: A | Discharge status: Home / Self Care | Payer: BC Managed Care – PPO | Source: Ambulatory Visit | Attending: Ophthalmology | Admitting: Ophthalmology

## 2014-01-05 DIAGNOSIS — E785 Hyperlipidemia, unspecified: Secondary | ICD-10-CM | POA: Insufficient documentation

## 2014-01-05 DIAGNOSIS — H268 Other specified cataract: Secondary | ICD-10-CM | POA: Insufficient documentation

## 2014-01-05 DIAGNOSIS — Z888 Allergy status to other drugs, medicaments and biological substances status: Secondary | ICD-10-CM | POA: Diagnosis not present

## 2014-01-05 DIAGNOSIS — I10 Essential (primary) hypertension: Secondary | ICD-10-CM | POA: Insufficient documentation

## 2014-01-05 DIAGNOSIS — K219 Gastro-esophageal reflux disease without esophagitis: Secondary | ICD-10-CM | POA: Insufficient documentation

## 2014-01-05 DIAGNOSIS — E119 Type 2 diabetes mellitus without complications: Secondary | ICD-10-CM | POA: Insufficient documentation

## 2014-01-05 DIAGNOSIS — F419 Anxiety disorder, unspecified: Secondary | ICD-10-CM | POA: Diagnosis not present

## 2014-01-05 DIAGNOSIS — Z882 Allergy status to sulfonamides status: Secondary | ICD-10-CM | POA: Insufficient documentation

## 2014-01-05 DIAGNOSIS — Z0000 Encounter for general adult medical examination without abnormal findings: Secondary | ICD-10-CM | POA: Insufficient documentation

## 2014-01-05 HISTORY — DX: Hyperlipidemia, unspecified: E78.5

## 2014-01-05 HISTORY — DX: Anxiety disorder, unspecified: F41.9

## 2014-01-05 LAB — BASIC METABOLIC PANEL
ANION GAP: 16 — AB (ref 5–15)
BUN: 26 mg/dL — ABNORMAL HIGH (ref 6–23)
CHLORIDE: 106 meq/L (ref 96–112)
CO2: 23 mEq/L (ref 19–32)
CREATININE: 1.02 mg/dL (ref 0.50–1.10)
Calcium: 10 mg/dL (ref 8.4–10.5)
GFR calc Af Amer: 64 mL/min — ABNORMAL LOW (ref >90)
GFR calc non Af Amer: 55 mL/min — ABNORMAL LOW (ref >90)
GLUCOSE: 112 mg/dL — AB (ref 70–99)
Potassium: 4.2 mEq/L (ref 3.7–5.3)
Sodium: 145 mEq/L (ref 137–147)

## 2014-01-05 LAB — HEMOGLOBIN AND HEMATOCRIT, BLOOD
HEMATOCRIT: 37.2 % (ref 36.0–46.0)
Hemoglobin: 12.3 g/dL (ref 12.0–15.0)

## 2014-01-05 NOTE — Patient Instructions (Signed)
Elizabeth Crosby  01/05/2014   Your procedure is scheduled on:  01/10/2014  Report to [facility] at 9:00 AM.  Call this number if you have problems the morning of surgery: [phone]   Remember:   Do not eat food or drink liquids after midnight.   Take these medicines the morning of surgery with A SIP OF WATER:   Albuterol & Symbicort inhalers (bring with you am of procedure, as well), Norvasc, Valium,  Zyrtec, Prilosec   NO METFORMIN DAY OF PROCEDURE             1/2 OF LANTUS DOSE NIGHT BEFORE AND YOU MAY COVER WITH NIGHT DOSE OF  HUMALOG     Do not wear jewelry, make-up or nail polish.  Do not wear lotions, powders, or perfumes. You may wear deodorant.  Do not shave 48 hours prior to surgery. Men may shave face and neck.  Do not bring valuables to the hospital.  [facility] is not responsible for any belongings or valuables.               Contacts, dentures or bridgework may not be worn into surgery.  Leave suitcase in the car. After surgery it may be brought to your room.  For patients admitted to the hospital, discharge time is determined by your treatment team.               Patients discharged the day of surgery will not be allowed to drive home.  Name and phone number of your driver:   Special Instructions: N/A   Please read over the following fact sheets that you were given: Anesthesia Post-op Instructions   PATIENT INSTRUCTIONS POST-ANESTHESIA  IMMEDIATELY FOLLOWING SURGERY:  Do not drive or operate machinery for the first twenty four hours after surgery.  Do not make any important decisions for twenty four hours after surgery or while taking narcotic pain medications or sedatives.  If you develop intractable nausea and vomiting or a severe headache please notify your doctor immediately.  FOLLOW-UP:  Please make an appointment with your surgeon as instructed. You do not need to follow up with anesthesia unless specifically instructed to do so.  WOUND CARE INSTRUCTIONS  (if applicable):  Keep a dry clean dressing on the anesthesia/puncture wound site if there is drainage.  Once the wound has quit draining you may leave it open to air.  Generally you should leave the bandage intact for twenty four hours unless there is drainage.  If the epidural site drains for more than 36-48 hours please call the anesthesia department.  QUESTIONS?:  Please feel free to call your physician or the hospital operator if you have any questions, and they will be happy to assist you.      Cataract A cataract is a clouding of the lens of the eye. When a lens becomes cloudy, vision is reduced based on the degree and nature of the clouding. Many cataracts reduce vision to some degree. Some cataracts make people more near-sighted as they develop. Other cataracts increase glare. Cataracts that are ignored and become worse can sometimes look white. The white color can be seen through the pupil. CAUSES   Aging. However, cataracts may occur at any age, even in newborns.  Certain drugs.  Trauma to the eye.  Certain diseases such as diabetes.  Specific eye diseases such as chronic inflammation inside the eye or a sudden attack of a rare form of glaucoma.  Inherited or acquired medical problems. SYMPTOMS  Gradual, progressive drop in vision in the affected eye.  Severe, rapid visual loss. This most often happens when trauma is the cause. DIAGNOSIS  To detect a cataract, an eye doctor examines the lens. Cataracts are best diagnosed with an exam of the eyes with the pupils enlarged (dilated) by drops.  TREATMENT  For an early cataract, vision may improve by using different eyeglasses or stronger lighting. If that does not help your vision, surgery is the only effective treatment. A cataract needs to be surgically removed when vision loss interferes with your everyday activities, such as driving, reading, or watching TV. A cataract may also have to be removed if it prevents examination or  treatment of another eye problem. Surgery removes the cloudy lens and usually replaces it with a substitute lens (intraocular lens, IOL).  At a time when both you and your doctor agree, the cataract will be surgically removed. If you have cataracts in both eyes, only one is usually removed at a time. This allows the operated eye to heal and be out of danger from any possible problems after surgery (such as infection or poor wound healing). In rare cases, a cataract may be doing damage to your eye. In these cases, your caregiver may advise surgical removal right away. The vast majority of people who have cataract surgery have better vision afterward. HOME CARE INSTRUCTIONS  If you are not planning surgery, you may be asked to do the following:  Use different eyeglasses.  Use stronger or brighter lighting.  Ask your eye doctor about reducing your medicine dose or changing medicines if it is thought that a medicine caused your cataract. Changing medicines does not make the cataract go away on its own.  Become familiar with your surroundings. Poor vision can lead to injury. Avoid bumping into things on the affected side. You are at a higher risk for tripping or falling.  Exercise extreme care when driving or operating machinery.  Wear sunglasses if you are sensitive to bright light or experiencing problems with glare. SEEK IMMEDIATE MEDICAL CARE IF:   You have a worsening or sudden vision loss.  You notice redness, swelling, or increasing pain in the eye.  You have a fever. Document Released: 03/18/2005 Document Revised: 06/10/2011 Document Reviewed: 11/09/2010 [facility] Patient Information 2015 [place], [place]. This information is not intended to replace advice given to you by your health care provider. Make sure you discuss any questions you have with your health care provider.

## 2014-01-07 MED ORDER — LIDOCAINE HCL 3.5 % OP GEL
OPHTHALMIC | Status: AC
  Filled 2014-01-07: qty 1

## 2014-01-07 MED ORDER — PHENYLEPHRINE HCL 2.5 % OP SOLN
OPHTHALMIC | Status: AC
  Filled 2014-01-07: qty 15

## 2014-01-07 MED ORDER — TETRACAINE HCL 0.5 % OP SOLN
OPHTHALMIC | Status: AC
  Filled 2014-01-07: qty 2

## 2014-01-07 MED ORDER — NEOMYCIN-POLYMYXIN-DEXAMETH 3.5-10000-0.1 OP SUSP
OPHTHALMIC | Status: AC
  Filled 2014-01-07: qty 5

## 2014-01-07 MED ORDER — CYCLOPENTOLATE-PHENYLEPHRINE OP SOLN OPTIME - NO CHARGE
OPHTHALMIC | Status: AC
  Filled 2014-01-07: qty 2

## 2014-01-07 MED ORDER — LIDOCAINE HCL (PF) 1 % IJ SOLN
INTRAMUSCULAR | Status: AC
Start: 2014-01-07 — End: 2014-01-07
  Filled 2014-01-07: qty 2

## 2014-01-10 ENCOUNTER — Ambulatory Visit (HOSPITAL_COMMUNITY): Payer: BC Managed Care – PPO | Admitting: Anesthesiology

## 2014-01-10 ENCOUNTER — Encounter (HOSPITAL_COMMUNITY): Payer: Self-pay

## 2014-01-10 ENCOUNTER — Encounter (HOSPITAL_COMMUNITY)
Admission: RE | Disposition: A | Discharge status: Home / Self Care | Payer: Self-pay | Source: Ambulatory Visit | Attending: Ophthalmology

## 2014-01-10 ENCOUNTER — Encounter (HOSPITAL_COMMUNITY): Payer: BC Managed Care – PPO | Admitting: Anesthesiology

## 2014-01-10 ENCOUNTER — Ambulatory Visit (HOSPITAL_COMMUNITY)
Admission: RE | Admit: 2014-01-10 | Discharge: 2014-01-10 | Disposition: A | Discharge status: Home / Self Care | Payer: BC Managed Care – PPO | Source: Ambulatory Visit | Attending: Ophthalmology | Admitting: Ophthalmology

## 2014-01-10 DIAGNOSIS — E119 Type 2 diabetes mellitus without complications: Secondary | ICD-10-CM | POA: Insufficient documentation

## 2014-01-10 DIAGNOSIS — F419 Anxiety disorder, unspecified: Secondary | ICD-10-CM | POA: Diagnosis not present

## 2014-01-10 DIAGNOSIS — H25811 Combined forms of age-related cataract, right eye: Secondary | ICD-10-CM | POA: Insufficient documentation

## 2014-01-10 DIAGNOSIS — I10 Essential (primary) hypertension: Secondary | ICD-10-CM | POA: Diagnosis not present

## 2014-01-10 DIAGNOSIS — K219 Gastro-esophageal reflux disease without esophagitis: Secondary | ICD-10-CM | POA: Diagnosis not present

## 2014-01-10 DIAGNOSIS — Z794 Long term (current) use of insulin: Secondary | ICD-10-CM | POA: Insufficient documentation

## 2014-01-10 DIAGNOSIS — Z79899 Other long term (current) drug therapy: Secondary | ICD-10-CM | POA: Diagnosis not present

## 2014-01-10 DIAGNOSIS — Z7982 Long term (current) use of aspirin: Secondary | ICD-10-CM | POA: Insufficient documentation

## 2014-01-10 DIAGNOSIS — H269 Unspecified cataract: Secondary | ICD-10-CM | POA: Diagnosis not present

## 2014-01-10 HISTORY — DX: Other complications of anesthesia, initial encounter: T88.59XA

## 2014-01-10 HISTORY — PX: CATARACT EXTRACTION W/PHACO: SHX586

## 2014-01-10 HISTORY — DX: Adverse effect of unspecified anesthetic, initial encounter: T41.45XA

## 2014-01-10 LAB — GLUCOSE, CAPILLARY: Glucose-Capillary: 119 mg/dL — ABNORMAL HIGH (ref 70–99)

## 2014-01-10 SURGERY — PHACOEMULSIFICATION, CATARACT, WITH IOL INSERTION
Anesthesia: Monitor Anesthesia Care | Site: Eye | Laterality: Right

## 2014-01-10 MED ORDER — KETOROLAC TROMETHAMINE 0.5 % OP SOLN: 1.0000 [drp] | OPHTHALMIC | Status: AC

## 2014-01-10 MED ORDER — CYCLOPENTOLATE-PHENYLEPHRINE 0.2-1 % OP SOLN
1.0000 [drp] | OPHTHALMIC | Status: AC
  Administered 2014-01-10 (×3): 1 [drp] via OPHTHALMIC

## 2014-01-10 MED ORDER — NEOMYCIN-POLYMYXIN-DEXAMETH 3.5-10000-0.1 OP SUSP
OPHTHALMIC | Status: DC | PRN
  Administered 2014-01-10: 2 [drp] via OPHTHALMIC

## 2014-01-10 MED ORDER — ONDANSETRON HCL 4 MG/2ML IJ SOLN
INTRAMUSCULAR | Status: AC
  Filled 2014-01-10: qty 2

## 2014-01-10 MED ORDER — FENTANYL CITRATE 0.05 MG/ML IJ SOLN
25.0000 ug | INTRAMUSCULAR | Status: AC
  Administered 2014-01-10 (×2): 25 ug via INTRAVENOUS

## 2014-01-10 MED ORDER — SODIUM CHLORIDE 0.9 % IV SOLN
INTRAVENOUS | Status: DC
  Administered 2014-01-10: 10:00:00 via INTRAVENOUS

## 2014-01-10 MED ORDER — MIDAZOLAM HCL 2 MG/2ML IJ SOLN
INTRAMUSCULAR | Status: AC
  Filled 2014-01-10: qty 2

## 2014-01-10 MED ORDER — FENTANYL CITRATE 0.05 MG/ML IJ SOLN: 25.0000 ug | INTRAMUSCULAR | Status: DC | PRN

## 2014-01-10 MED ORDER — LIDOCAINE HCL (PF) 1 % IJ SOLN
INTRAMUSCULAR | Status: DC | PRN
  Administered 2014-01-10: .6 mL

## 2014-01-10 MED ORDER — LACTATED RINGERS IV SOLN: INTRAVENOUS | Status: DC | PRN

## 2014-01-10 MED ORDER — EPINEPHRINE HCL 1 MG/ML IJ SOLN
INTRAMUSCULAR | Status: AC
  Filled 2014-01-10: qty 1

## 2014-01-10 MED ORDER — FENTANYL CITRATE 0.05 MG/ML IJ SOLN
INTRAMUSCULAR | Status: AC
  Filled 2014-01-10: qty 2

## 2014-01-10 MED ORDER — LIDOCAINE HCL 3.5 % OP GEL: 1.0000 "application " | OPHTHALMIC | Status: AC

## 2014-01-10 MED ORDER — SODIUM CHLORIDE 0.9 % IV SOLN
INTRAVENOUS | Status: DC | PRN
  Administered 2014-01-10: 09:00:00 via INTRAVENOUS

## 2014-01-10 MED ORDER — ONDANSETRON HCL 4 MG/2ML IJ SOLN
4.0000 mg | Freq: Once | INTRAMUSCULAR | Status: AC
  Administered 2014-01-10: 4 mg via INTRAVENOUS

## 2014-01-10 MED ORDER — MIDAZOLAM HCL 2 MG/2ML IJ SOLN
1.0000 mg | INTRAMUSCULAR | Status: DC | PRN
  Administered 2014-01-10 (×2): 2 mg via INTRAVENOUS
  Filled 2014-01-10: qty 2

## 2014-01-10 MED ORDER — POVIDONE-IODINE 5 % OP SOLN
OPHTHALMIC | Status: DC | PRN
  Administered 2014-01-10: 1 via OPHTHALMIC

## 2014-01-10 MED ORDER — TETRACAINE HCL 0.5 % OP SOLN
1.0000 [drp] | OPHTHALMIC | Status: AC
  Administered 2014-01-10 (×3): 1 [drp] via OPHTHALMIC

## 2014-01-10 MED ORDER — ONDANSETRON HCL 4 MG/2ML IJ SOLN: 4.0000 mg | Freq: Once | INTRAMUSCULAR | Status: DC | PRN

## 2014-01-10 MED ORDER — PROVISC 10 MG/ML IO SOLN
INTRAOCULAR | Status: DC | PRN
  Administered 2014-01-10: 0.85 mL via INTRAOCULAR

## 2014-01-10 MED ORDER — EPINEPHRINE HCL 1 MG/ML IJ SOLN
INTRAOCULAR | Status: DC | PRN
  Administered 2014-01-10: 10:00:00

## 2014-01-10 MED ORDER — PHENYLEPHRINE HCL 2.5 % OP SOLN
1.0000 [drp] | OPHTHALMIC | Status: AC
  Administered 2014-01-10 (×3): 1 [drp] via OPHTHALMIC

## 2014-01-10 MED ORDER — BSS IO SOLN
INTRAOCULAR | Status: DC | PRN
  Administered 2014-01-10: 15 mL via INTRAOCULAR

## 2014-01-10 SURGICAL SUPPLY — 33 items
CAPSULAR TENSION RING-AMO (OPHTHALMIC RELATED) IMPLANT
CLOTH BEACON ORANGE TIMEOUT ST (SAFETY) ×2 IMPLANT
EYE SHIELD UNIVERSAL CLEAR (GAUZE/BANDAGES/DRESSINGS) ×2 IMPLANT
GLOVE BIO SURGEON STRL SZ 6.5 (GLOVE) IMPLANT
GLOVE BIOGEL PI IND STRL 6.5 (GLOVE) IMPLANT
GLOVE BIOGEL PI IND STRL 7.0 (GLOVE) ×1 IMPLANT
GLOVE BIOGEL PI IND STRL 7.5 (GLOVE) IMPLANT
GLOVE BIOGEL PI INDICATOR 6.5 (GLOVE)
GLOVE BIOGEL PI INDICATOR 7.0 (GLOVE) ×1
GLOVE BIOGEL PI INDICATOR 7.5 (GLOVE)
GLOVE ECLIPSE 6.5 STRL STRAW (GLOVE) IMPLANT
GLOVE ECLIPSE 7.0 STRL STRAW (GLOVE) IMPLANT
GLOVE ECLIPSE 7.5 STRL STRAW (GLOVE) IMPLANT
GLOVE EXAM NITRILE LRG STRL (GLOVE) IMPLANT
GLOVE EXAM NITRILE MD LF STRL (GLOVE) ×2 IMPLANT
GLOVE SKINSENSE NS SZ6.5 (GLOVE)
GLOVE SKINSENSE NS SZ7.0 (GLOVE)
GLOVE SKINSENSE STRL SZ6.5 (GLOVE) IMPLANT
GLOVE SKINSENSE STRL SZ7.0 (GLOVE) IMPLANT
KIT VITRECTOMY (OPHTHALMIC RELATED) IMPLANT
PAD ARMBOARD 7.5X6 YLW CONV (MISCELLANEOUS) ×2 IMPLANT
PROC W NO LENS (INTRAOCULAR LENS)
PROC W SPEC LENS (INTRAOCULAR LENS)
PROCESS W NO LENS (INTRAOCULAR LENS) IMPLANT
PROCESS W SPEC LENS (INTRAOCULAR LENS) IMPLANT
RETRACTOR IRIS SIGHTPATH (OPHTHALMIC RELATED) IMPLANT
RING MALYGIN (MISCELLANEOUS) IMPLANT
SIGHTPATH CAT PROC W REG LENS (Ophthalmic Related) ×2 IMPLANT
SYRINGE LUER LOK 1CC (MISCELLANEOUS) ×2 IMPLANT
TAPE SURG TRANSPORE 1 IN (GAUZE/BANDAGES/DRESSINGS) ×1 IMPLANT
TAPE SURGICAL TRANSPORE 1 IN (GAUZE/BANDAGES/DRESSINGS) ×1
VISCOELASTIC ADDITIONAL (OPHTHALMIC RELATED) IMPLANT
WATER STERILE IRR 250ML POUR (IV SOLUTION) ×2 IMPLANT

## 2014-01-10 NOTE — Anesthesia Preprocedure Evaluation (Signed)
Anesthesia Evaluation  Patient identified by MRN, date of birth, ID band Patient awake    Reviewed: Allergy & Precautions, H&P , NPO status , Patient's Chart, lab work & pertinent test results  History of Anesthesia Complications (+) PONV and history of anesthetic complications  Airway Mallampati: II TM Distance: >3 FB Neck ROM: Full    Dental  (+) Teeth Intact, Caps, Dental Advisory Given   Pulmonary neg pulmonary ROS,  breath sounds clear to auscultation        Cardiovascular hypertension, Pt. on medications negative cardio ROS  Rhythm:Regular Rate:Normal + Systolic murmurs    Neuro/Psych PSYCHIATRIC DISORDERS Anxiety negative neurological ROS     GI/Hepatic negative GI ROS, Neg liver ROS, GERD-  Medicated,  Endo/Other  negative endocrine ROSdiabetes, Type 2, Insulin Dependent, Oral Hypoglycemic Agents  Renal/GU negative Renal ROS  negative genitourinary   Musculoskeletal negative musculoskeletal ROS (+)   Abdominal   Peds negative pediatric ROS (+)  Hematology negative hematology ROS (+)   Anesthesia Other Findings   Reproductive/Obstetrics negative OB ROS                           Anesthesia Physical Anesthesia Plan  ASA: III  Anesthesia Plan: MAC   Post-op Pain Management:    Induction: Intravenous  Airway Management Planned: Nasal Cannula  Additional Equipment:   Intra-op Plan:   Post-operative Plan:   Informed Consent: I have reviewed the patients History and Physical, chart, labs and discussed the procedure including the risks, benefits and alternatives for the proposed anesthesia with the patient or authorized representative who has indicated his/her understanding and acceptance.   Dental advisory given  Plan Discussed with: CRNA  Anesthesia Plan Comments:         Anesthesia Quick Evaluation

## 2014-01-10 NOTE — H&P (Signed)
I have reviewed the H&P, the patient was re-examined, and I have identified no interval changes in medical condition and plan of care since the history and physical of record  

## 2014-01-10 NOTE — Discharge Instructions (Signed)

## 2014-01-10 NOTE — Transfer of Care (Signed)
Immediate Anesthesia Transfer of Care Note  Patient: Elizabeth Crosby  Procedure(s) Performed: Procedure(s) with comments: CATARACT EXTRACTION PHACO AND INTRAOCULAR LENS PLACEMENT (IOC) (Right) - CDE:5.78  Patient Location: PACU  Anesthesia Type:MAC  Level of Consciousness: awake  Airway & Oxygen Therapy: Patient Spontanous Breathing  Post-op Assessment: Report given to PACU RN  Post vital signs: Reviewed  Complications: No apparent anesthesia complications

## 2014-01-10 NOTE — Op Note (Signed)
Date of Admission: 01/10/2014  Date of Surgery: 01/10/2014   Pre-Op Dx: Cataract Right Eye  Post-Op Dx: Senile Combined Cataract Right  Eye,  Dx Code [id number]  Surgeon: Tonny Branch, M.D.  Assistants: None  Anesthesia: Topical with MAC  Indications: Painless, progressive loss of vision with compromise of daily activities.  Surgery: Cataract Extraction with Intraocular lens Implant Right Eye  Discription: The patient had dilating drops and viscous lidocaine placed into the Right eye in the pre-op holding area. After transfer to the operating room, a time out was performed. The patient was then prepped and draped. Beginning with a 34 degree blade a paracentesis port was made at the surgeon's 2 o'clock position. The anterior chamber was then filled with 1% non-preserved lidocaine. This was followed by filling the anterior chamber with Provisc.  A 2.73mm keratome blade was used to make a clear corneal incision at the temporal limbus.  A bent cystatome needle was used to create a continuous tear capsulotomy. Hydrodissection was performed with balanced salt solution on a Fine canula. The lens nucleus was then removed using the phacoemulsification handpiece. Residual cortex was removed with the I&A handpiece. The anterior chamber and capsular bag were refilled with Provisc. A posterior chamber intraocular lens was placed into the capsular bag with it's injector. The implant was positioned with the Kuglan hook. The Provisc was then removed from the anterior chamber and capsular bag with the I&A handpiece. Stromal hydration of the main incision and paracentesis port was performed with BSS on a Fine canula. The wounds were tested for leak which was negative. The patient tolerated the procedure well. There were no operative complications. The patient was then transferred to the recovery room in stable condition.  Complications: None  Specimen: None  EBL: None  Prosthetic device: Hoya iSert 250, power 20.0  D, SN [id number].

## 2014-01-10 NOTE — Anesthesia Procedure Notes (Signed)
Procedure Name: MAC Date/Time: 01/10/2014 10:17 AM Performed by: Andree Elk, Ariauna Farabee A Pre-anesthesia Checklist: Patient identified, Timeout performed, Emergency Drugs available, Suction available and Patient being monitored Oxygen Delivery Method: Nasal cannula

## 2014-01-10 NOTE — Anesthesia Postprocedure Evaluation (Signed)
  Anesthesia Post-op Note  Patient: Elizabeth Crosby  Procedure(s) Performed: Procedure(s) with comments: CATARACT EXTRACTION PHACO AND INTRAOCULAR LENS PLACEMENT (IOC) (Right) - CDE:5.78  Patient Location: PACU  Anesthesia Type:MAC  Level of Consciousness: awake, alert  and oriented  Airway and Oxygen Therapy: Patient Spontanous Breathing  Post-op Pain: none  Post-op Assessment: Post-op Vital signs reviewed, Patient's Cardiovascular Status Stable, Respiratory Function Stable, Patent Airway and No signs of Nausea or vomiting  Post-op Vital Signs: Reviewed and stable  Last Vitals:  Filed Vitals:   01/10/14 0955  BP: 169/67  Pulse:   Temp:   Resp: 23    Complications: No apparent anesthesia complications

## 2014-01-11 ENCOUNTER — Encounter (HOSPITAL_COMMUNITY): Payer: Self-pay | Admitting: Ophthalmology

## 2014-01-20 ENCOUNTER — Encounter (HOSPITAL_COMMUNITY): Payer: Self-pay | Admitting: Pharmacy Technician

## 2014-01-20 DIAGNOSIS — H25812 Combined forms of age-related cataract, left eye: Secondary | ICD-10-CM | POA: Diagnosis not present

## 2014-01-21 ENCOUNTER — Encounter (HOSPITAL_COMMUNITY)
Admission: RE | Admit: 2014-01-21 | Discharge: 2014-01-21 | Disposition: A | Discharge status: Home / Self Care | Payer: BC Managed Care – PPO | Source: Ambulatory Visit | Attending: Ophthalmology | Admitting: Ophthalmology

## 2014-01-26 MED ORDER — LIDOCAINE HCL (PF) 1 % IJ SOLN
INTRAMUSCULAR | Status: AC
  Filled 2014-01-26: qty 2

## 2014-01-26 MED ORDER — TETRACAINE HCL 0.5 % OP SOLN
OPHTHALMIC | Status: AC
  Filled 2014-01-26: qty 2

## 2014-01-26 MED ORDER — NEOMYCIN-POLYMYXIN-DEXAMETH 3.5-10000-0.1 OP SUSP
OPHTHALMIC | Status: AC
  Filled 2014-01-26: qty 5

## 2014-01-26 MED ORDER — LIDOCAINE HCL 3.5 % OP GEL
OPHTHALMIC | Status: AC
  Filled 2014-01-26: qty 1

## 2014-01-26 MED ORDER — CYCLOPENTOLATE-PHENYLEPHRINE OP SOLN OPTIME - NO CHARGE
OPHTHALMIC | Status: AC
  Filled 2014-01-26: qty 2

## 2014-01-26 MED ORDER — PHENYLEPHRINE HCL 2.5 % OP SOLN
OPHTHALMIC | Status: AC
Start: 2014-01-26 — End: 2014-01-26
  Filled 2014-01-26: qty 15

## 2014-01-27 ENCOUNTER — Encounter (HOSPITAL_COMMUNITY): Payer: BC Managed Care – PPO | Admitting: Anesthesiology

## 2014-01-27 ENCOUNTER — Ambulatory Visit (HOSPITAL_COMMUNITY)
Admission: RE | Admit: 2014-01-27 | Discharge: 2014-01-27 | Disposition: A | Discharge status: Home / Self Care | Payer: BC Managed Care – PPO | Source: Ambulatory Visit | Attending: Ophthalmology | Admitting: Ophthalmology

## 2014-01-27 ENCOUNTER — Encounter (HOSPITAL_COMMUNITY)
Admission: RE | Disposition: A | Discharge status: Home / Self Care | Payer: Self-pay | Source: Ambulatory Visit | Attending: Ophthalmology

## 2014-01-27 ENCOUNTER — Encounter (HOSPITAL_COMMUNITY): Payer: Self-pay | Admitting: *Deleted

## 2014-01-27 ENCOUNTER — Ambulatory Visit (HOSPITAL_COMMUNITY): Payer: BC Managed Care – PPO | Admitting: Anesthesiology

## 2014-01-27 DIAGNOSIS — F419 Anxiety disorder, unspecified: Secondary | ICD-10-CM | POA: Diagnosis not present

## 2014-01-27 DIAGNOSIS — I10 Essential (primary) hypertension: Secondary | ICD-10-CM | POA: Diagnosis not present

## 2014-01-27 DIAGNOSIS — K219 Gastro-esophageal reflux disease without esophagitis: Secondary | ICD-10-CM | POA: Diagnosis not present

## 2014-01-27 DIAGNOSIS — Z7951 Long term (current) use of inhaled steroids: Secondary | ICD-10-CM | POA: Insufficient documentation

## 2014-01-27 DIAGNOSIS — Z79899 Other long term (current) drug therapy: Secondary | ICD-10-CM | POA: Insufficient documentation

## 2014-01-27 DIAGNOSIS — Z7982 Long term (current) use of aspirin: Secondary | ICD-10-CM | POA: Diagnosis not present

## 2014-01-27 DIAGNOSIS — Z794 Long term (current) use of insulin: Secondary | ICD-10-CM | POA: Diagnosis not present

## 2014-01-27 DIAGNOSIS — H25812 Combined forms of age-related cataract, left eye: Secondary | ICD-10-CM | POA: Diagnosis not present

## 2014-01-27 DIAGNOSIS — E119 Type 2 diabetes mellitus without complications: Secondary | ICD-10-CM | POA: Diagnosis not present

## 2014-01-27 DIAGNOSIS — H259 Unspecified age-related cataract: Secondary | ICD-10-CM | POA: Diagnosis not present

## 2014-01-27 HISTORY — PX: CATARACT EXTRACTION W/PHACO: SHX586

## 2014-01-27 LAB — GLUCOSE, CAPILLARY: Glucose-Capillary: 120 mg/dL — ABNORMAL HIGH (ref 70–99)

## 2014-01-27 SURGERY — PHACOEMULSIFICATION, CATARACT, WITH IOL INSERTION
Anesthesia: Monitor Anesthesia Care | Site: Eye | Laterality: Left

## 2014-01-27 MED ORDER — FENTANYL CITRATE 0.05 MG/ML IJ SOLN
25.0000 ug | INTRAMUSCULAR | Status: AC
  Administered 2014-01-27 (×2): 25 ug via INTRAVENOUS

## 2014-01-27 MED ORDER — CYCLOPENTOLATE-PHENYLEPHRINE 0.2-1 % OP SOLN
1.0000 [drp] | OPHTHALMIC | Status: AC
  Administered 2014-01-27 (×3): 1 [drp] via OPHTHALMIC

## 2014-01-27 MED ORDER — MIDAZOLAM HCL 2 MG/2ML IJ SOLN
INTRAMUSCULAR | Status: AC
  Filled 2014-01-27: qty 2

## 2014-01-27 MED ORDER — LIDOCAINE 3.5 % OP GEL OPTIME - NO CHARGE
OPHTHALMIC | Status: DC | PRN
  Administered 2014-01-27: 2 [drp] via OPHTHALMIC

## 2014-01-27 MED ORDER — PHENYLEPHRINE HCL 2.5 % OP SOLN
1.0000 [drp] | OPHTHALMIC | Status: AC
  Administered 2014-01-27 (×3): 1 [drp] via OPHTHALMIC

## 2014-01-27 MED ORDER — NEOMYCIN-POLYMYXIN-DEXAMETH 3.5-10000-0.1 OP SUSP
OPHTHALMIC | Status: DC | PRN
  Administered 2014-01-27: 2 [drp] via OPHTHALMIC

## 2014-01-27 MED ORDER — FENTANYL CITRATE 0.05 MG/ML IJ SOLN
INTRAMUSCULAR | Status: AC
  Filled 2014-01-27: qty 2

## 2014-01-27 MED ORDER — PROVISC 10 MG/ML IO SOLN
INTRAOCULAR | Status: DC | PRN
  Administered 2014-01-27: 0.85 mL via INTRAOCULAR

## 2014-01-27 MED ORDER — EPINEPHRINE HCL 1 MG/ML IJ SOLN
INTRAMUSCULAR | Status: AC
  Filled 2014-01-27: qty 1

## 2014-01-27 MED ORDER — POVIDONE-IODINE 5 % OP SOLN
OPHTHALMIC | Status: DC | PRN
  Administered 2014-01-27: 1 via OPHTHALMIC

## 2014-01-27 MED ORDER — TETRACAINE HCL 0.5 % OP SOLN
1.0000 [drp] | OPHTHALMIC | Status: AC
  Administered 2014-01-27 (×3): 1 [drp] via OPHTHALMIC

## 2014-01-27 MED ORDER — MIDAZOLAM HCL 2 MG/2ML IJ SOLN
1.0000 mg | INTRAMUSCULAR | Status: AC | PRN
  Administered 2014-01-27: 2 mg via INTRAVENOUS
  Administered 2014-01-27 (×2): 1 mg via INTRAVENOUS
  Filled 2014-01-27: qty 2

## 2014-01-27 MED ORDER — LIDOCAINE HCL 3.5 % OP GEL
1.0000 "application " | Freq: Once | OPHTHALMIC | Status: AC
  Administered 2014-01-27: 1 via OPHTHALMIC

## 2014-01-27 MED ORDER — EPINEPHRINE HCL 1 MG/ML IJ SOLN
INTRAOCULAR | Status: DC | PRN
  Administered 2014-01-27: 10:00:00

## 2014-01-27 MED ORDER — BSS IO SOLN
INTRAOCULAR | Status: DC | PRN
  Administered 2014-01-27: 15 mL

## 2014-01-27 MED ORDER — SODIUM CHLORIDE 0.9 % IV SOLN
INTRAVENOUS | Status: DC
  Administered 2014-01-27: 09:00:00 via INTRAVENOUS

## 2014-01-27 MED ORDER — LIDOCAINE HCL (PF) 1 % IJ SOLN
INTRAMUSCULAR | Status: DC | PRN
  Administered 2014-01-27: .8 mL

## 2014-01-27 SURGICAL SUPPLY — 11 items
CLOTH BEACON ORANGE TIMEOUT ST (SAFETY) ×2 IMPLANT
EYE SHIELD UNIVERSAL CLEAR (GAUZE/BANDAGES/DRESSINGS) ×2 IMPLANT
GLOVE BIOGEL PI IND STRL 7.0 (GLOVE) ×1 IMPLANT
GLOVE BIOGEL PI INDICATOR 7.0 (GLOVE) ×1
GLOVE EXAM NITRILE MD LF STRL (GLOVE) ×2 IMPLANT
PAD ARMBOARD 7.5X6 YLW CONV (MISCELLANEOUS) ×2 IMPLANT
SIGHTPATH CAT PROC W REG LENS (Ophthalmic Related) ×2 IMPLANT
SYRINGE LUER LOK 1CC (MISCELLANEOUS) ×2 IMPLANT
TAPE SURG TRANSPORE 1 IN (GAUZE/BANDAGES/DRESSINGS) ×1 IMPLANT
TAPE SURGICAL TRANSPORE 1 IN (GAUZE/BANDAGES/DRESSINGS) ×1
WATER STERILE IRR 250ML POUR (IV SOLUTION) ×2 IMPLANT

## 2014-01-27 NOTE — Anesthesia Preprocedure Evaluation (Signed)
Anesthesia Evaluation  Patient identified by MRN, date of birth, ID band Patient awake    Reviewed: Allergy & Precautions, H&P , NPO status , Patient's Chart, lab work & pertinent test results  History of Anesthesia Complications (+) PONV and history of anesthetic complications  Airway Mallampati: II  TM Distance: >3 FB Neck ROM: Full    Dental  (+) Teeth Intact, Caps, Dental Advisory Given   Pulmonary neg pulmonary ROS,  breath sounds clear to auscultation        Cardiovascular hypertension, Pt. on medications negative cardio ROS  Rhythm:Regular Rate:Normal + Systolic murmurs    Neuro/Psych Anxiety negative neurological ROS  negative psych ROS   GI/Hepatic negative GI ROS, Neg liver ROS, GERD-  Medicated,  Endo/Other  negative endocrine ROSdiabetes, Type 2, Insulin Dependent, Oral Hypoglycemic Agents  Renal/GU negative Renal ROS  negative genitourinary   Musculoskeletal negative musculoskeletal ROS (+)   Abdominal   Peds negative pediatric ROS (+)  Hematology negative hematology ROS (+)   Anesthesia Other Findings   Reproductive/Obstetrics negative OB ROS                             Anesthesia Physical Anesthesia Plan  ASA: III  Anesthesia Plan: MAC   Post-op Pain Management:    Induction: Intravenous  Airway Management Planned: Nasal Cannula  Additional Equipment:   Intra-op Plan:   Post-operative Plan:   Informed Consent: I have reviewed the patients History and Physical, chart, labs and discussed the procedure including the risks, benefits and alternatives for the proposed anesthesia with the patient or authorized representative who has indicated his/her understanding and acceptance.     Plan Discussed with:   Anesthesia Plan Comments:         Anesthesia Quick Evaluation

## 2014-01-27 NOTE — Transfer of Care (Signed)
Immediate Anesthesia Transfer of Care Note  Patient: Elizabeth Crosby  Procedure(s) Performed: Procedure(s) (LRB): CATARACT EXTRACTION PHACO AND INTRAOCULAR LENS PLACEMENT LEFT EYE CDE=5.75 (Left)  Patient Location: Shortstay  Anesthesia Type: MAC  Level of Consciousness: awake  Airway & Oxygen Therapy: Patient Spontanous Breathing   Post-op Assessment: Report given to PACU RN, Post -op Vital signs reviewed and stable and Patient moving all extremities  Post vital signs: Reviewed and stable  Complications: No apparent anesthesia complications

## 2014-01-27 NOTE — Op Note (Signed)
Date of Admission: 01/27/2014  Date of Surgery: 01/27/2014   Pre-Op Dx: Cataract Left Eye  Post-Op Dx: Senile Combined Cataract Left  Eye,  Dx Code [id number]  Surgeon: Tonny Branch, M.D.  Assistants: None  Anesthesia: Topical with MAC  Indications: Painless, progressive loss of vision with compromise of daily activities.  Surgery: Cataract Extraction with Intraocular lens Implant Left Eye  Discription: The patient had dilating drops and viscous lidocaine placed into the Left eye in the pre-op holding area. After transfer to the operating room, a time out was performed. The patient was then prepped and draped. Beginning with a 36 degree blade a paracentesis port was made at the surgeon's 2 o'clock position. The anterior chamber was then filled with 1% non-preserved lidocaine. This was followed by filling the anterior chamber with Provisc.  A 2.45mm keratome blade was used to make a clear corneal incision at the temporal limbus.  A bent cystatome needle was used to create a continuous tear capsulotomy. Hydrodissection was performed with balanced salt solution on a Fine canula. The lens nucleus was then removed using the phacoemulsification handpiece. Residual cortex was removed with the I&A handpiece. The anterior chamber and capsular bag were refilled with Provisc. A posterior chamber intraocular lens was placed into the capsular bag with it's injector. The implant was positioned with the Kuglan hook. The Provisc was then removed from the anterior chamber and capsular bag with the I&A handpiece. Stromal hydration of the main incision and paracentesis port was performed with BSS on a Fine canula. The wounds were tested for leak which was negative. The patient tolerated the procedure well. There were no operative complications. The patient was then transferred to the recovery room in stable condition.  Complications: None  Specimen: None  EBL: None  Prosthetic device: Hoya iSert 250, power 19.5 D,  SN [id number].

## 2014-01-27 NOTE — Anesthesia Procedure Notes (Signed)
Procedure Name: MAC Date/Time: 01/27/2014 9:25 AM Performed by: Vista Deck Pre-anesthesia Checklist: Patient identified, Emergency Drugs available, Suction available, Timeout performed and Patient being monitored Patient Re-evaluated:Patient Re-evaluated prior to inductionOxygen Delivery Method: Nasal Cannula

## 2014-01-27 NOTE — H&P (Signed)
I have reviewed the H&P, the patient was re-examined, and I have identified no interval changes in medical condition and plan of care since the history and physical of record  

## 2014-01-27 NOTE — Discharge Instructions (Signed)

## 2014-01-27 NOTE — Anesthesia Postprocedure Evaluation (Signed)
  Anesthesia Post-op Note  Patient: Elizabeth Crosby  Procedure(s) Performed: Procedure(s) (LRB): CATARACT EXTRACTION PHACO AND INTRAOCULAR LENS PLACEMENT LEFT EYE CDE=5.75 (Left)  Patient Location:  Short Stay  Anesthesia Type: MAC  Level of Consciousness: awake  Airway and Oxygen Therapy: Patient Spontanous Breathing  Post-op Pain: none  Post-op Assessment: Post-op Vital signs reviewed, Patient's Cardiovascular Status Stable, Respiratory Function Stable, Patent Airway, No signs of Nausea or vomiting and Pain level controlled  Post-op Vital Signs: Reviewed and stable  Complications: No apparent anesthesia complications

## 2014-01-28 ENCOUNTER — Encounter (HOSPITAL_COMMUNITY): Payer: Self-pay | Admitting: Ophthalmology

## 2014-02-22 DIAGNOSIS — J019 Acute sinusitis, unspecified: Secondary | ICD-10-CM | POA: Diagnosis not present

## 2014-02-22 DIAGNOSIS — R05 Cough: Secondary | ICD-10-CM | POA: Diagnosis not present

## 2014-03-04 ENCOUNTER — Encounter (HOSPITAL_BASED_OUTPATIENT_CLINIC_OR_DEPARTMENT_OTHER): Payer: Self-pay

## 2014-03-04 ENCOUNTER — Emergency Department (HOSPITAL_BASED_OUTPATIENT_CLINIC_OR_DEPARTMENT_OTHER)
Admission: EM | Admit: 2014-03-04 | Discharge: 2014-03-05 | Disposition: A | Discharge status: Home / Self Care | Payer: BC Managed Care – PPO | Attending: Emergency Medicine | Admitting: Emergency Medicine

## 2014-03-04 DIAGNOSIS — Z79899 Other long term (current) drug therapy: Secondary | ICD-10-CM | POA: Insufficient documentation

## 2014-03-04 DIAGNOSIS — Z7952 Long term (current) use of systemic steroids: Secondary | ICD-10-CM | POA: Diagnosis not present

## 2014-03-04 DIAGNOSIS — E785 Hyperlipidemia, unspecified: Secondary | ICD-10-CM | POA: Insufficient documentation

## 2014-03-04 DIAGNOSIS — Z792 Long term (current) use of antibiotics: Secondary | ICD-10-CM | POA: Insufficient documentation

## 2014-03-04 DIAGNOSIS — E119 Type 2 diabetes mellitus without complications: Secondary | ICD-10-CM | POA: Diagnosis not present

## 2014-03-04 DIAGNOSIS — I10 Essential (primary) hypertension: Secondary | ICD-10-CM | POA: Insufficient documentation

## 2014-03-04 DIAGNOSIS — L03115 Cellulitis of right lower limb: Secondary | ICD-10-CM | POA: Diagnosis not present

## 2014-03-04 DIAGNOSIS — J019 Acute sinusitis, unspecified: Secondary | ICD-10-CM | POA: Diagnosis not present

## 2014-03-04 DIAGNOSIS — K219 Gastro-esophageal reflux disease without esophagitis: Secondary | ICD-10-CM | POA: Insufficient documentation

## 2014-03-04 DIAGNOSIS — Z794 Long term (current) use of insulin: Secondary | ICD-10-CM | POA: Diagnosis not present

## 2014-03-04 DIAGNOSIS — R509 Fever, unspecified: Secondary | ICD-10-CM | POA: Diagnosis not present

## 2014-03-04 DIAGNOSIS — Z7982 Long term (current) use of aspirin: Secondary | ICD-10-CM | POA: Insufficient documentation

## 2014-03-04 DIAGNOSIS — M10071 Idiopathic gout, right ankle and foot: Secondary | ICD-10-CM | POA: Diagnosis not present

## 2014-03-04 DIAGNOSIS — M25571 Pain in right ankle and joints of right foot: Secondary | ICD-10-CM | POA: Diagnosis present

## 2014-03-04 MED ORDER — HYDROCODONE-ACETAMINOPHEN 5-325 MG PO TABS: 1.0000 | ORAL_TABLET | Freq: Four times a day (QID) | ORAL | Status: DC | PRN

## 2014-03-04 MED ORDER — LEVOFLOXACIN 750 MG PO TABS
750.0000 mg | ORAL_TABLET | Freq: Once | ORAL | Status: AC
  Administered 2014-03-04: 750 mg via ORAL

## 2014-03-04 MED ORDER — HYDROCODONE-ACETAMINOPHEN 5-325 MG PO TABS
1.0000 | ORAL_TABLET | Freq: Once | ORAL | Status: AC
  Administered 2014-03-04: 1 via ORAL

## 2014-03-04 MED ORDER — LEVOFLOXACIN 750 MG PO TABS: 750.0000 mg | ORAL_TABLET | Freq: Every day | ORAL | Status: DC

## 2014-03-04 NOTE — ED Notes (Signed)
Pt reports has had some cough and congestion, started on antibiotics but got a red swollen area on L ankle area.  [facility] and told she may be having gout flare.  Area on exam appears slightly cellulitic with small dark spot at center. Warm to touch.

## 2014-03-04 NOTE — ED Provider Notes (Signed)
CSN: [id number]     Arrival date & time 03/04/14  2058 History  This chart was scribed for Wynetta Fines, MD by Chester Holstein, ED Scribe. This patient was seen in room MH02/MH02 and the patient's care was started at 11:20 PM.    Chief Complaint  Patient presents with  . Ankle Pain     The history is provided by the patient. No language interpreter was used.    HPI Comments: Elizabeth Crosby is a 68 y.o. female with a PMHx of gout who presents to the Emergency Department complaining of right ankle pain with onset 2 days ago. Pt notes associated subjective fever, swelling, redness, and warmth. Pt notes pain is 3/10 but worsens to 8/10 when standing. Pt was seen for a sore throat and cough on 02/26/2014 and was administered Augmentin, Benzonatate, fluticasone nasal spray, and an albuterol inhaler.  Pt stopped taking the medications for her cold with concern of an adverse reaction to the medication. Pt was seen earlier today and told she may have gout. Pt has not had a flare up of gout in 4 years. Pt denies vomiting or nausea. She has had some occasional shortness of breath but has not been using her albuterol inhaler. Pt's PCP is Dr. Brigitte Pulse.   Past Medical History  Diagnosis Date  . Hypertension   . GERD (gastroesophageal reflux disease)   . History of blood transfusion     as child after accident, after childbirth/hemorroid surgery  . Diabetes mellitus without complication   . Anxiety   . Hyperlipidemia   . Complication of anesthesia    Past Surgical History  Procedure Laterality Date  . Cholecystectomy    . Appendectomy    . Colonoscopy with propofol N/A 09/01/2012    Procedure: COLONOSCOPY WITH PROPOFOL;  Surgeon: Garlan Fair, MD;  Location: [facility];  Service: Endoscopy;  Laterality: N/A;  . Cataract extraction w/phaco Right 01/10/2014    Procedure: CATARACT EXTRACTION PHACO AND INTRAOCULAR LENS PLACEMENT ([facility]);  Surgeon: Tonny Branch, MD;  Location: [facility];  Service:  Ophthalmology;  Laterality: Right;  CDE:5.78  . Cataract extraction w/phaco Left 01/27/2014    Procedure: CATARACT EXTRACTION PHACO AND INTRAOCULAR LENS PLACEMENT LEFT EYE CDE=5.75;  Surgeon: Tonny Branch, MD;  Location: [facility];  Service: Ophthalmology;  Laterality: Left;   Family History  Problem Relation Age of Onset  . Stroke Mother   . Asthma Father   . Colon cancer Father    History  Substance Use Topics  . Smoking status: Never Smoker   . Smokeless tobacco: Never Used  . Alcohol Use: No   OB History    No data available     Review of Systems  All other systems reviewed and are negative.   Allergies  Lipitor; Sulfa antibiotics; and Potassium-containing compounds  Home Medications   Prior to Admission medications   Medication Sig Start Date End Date Taking? Authorizing Provider  benzonatate (TESSALON) 100 MG capsule Take by mouth 3 (three) times daily as needed for cough.   Yes Historical Provider, MD  fluticasone (FLONASE) 50 MCG/ACT nasal spray Place into both nostrils daily.   Yes Historical Provider, MD  loratadine (CLARITIN) 10 MG tablet Take 10 mg by mouth daily.   Yes Historical Provider, MD  acetaminophen (TYLENOL) 500 MG tablet Take 1,000 mg by mouth every 6 (six) hours as needed for pain.    Historical Provider, MD  albuterol (PROVENTIL HFA;VENTOLIN HFA) 108 (90 BASE) MCG/ACT inhaler Inhale 2 puffs  into the lungs every 4 (four) hours as needed for wheezing. 06/17/13   Deneise Lever, MD  amLODipine (NORVASC) 5 MG tablet Take 5 mg by mouth every morning.     Historical Provider, MD  aspirin EC 81 MG tablet Take 81 mg by mouth every morning.    Historical Provider, MD  atorvastatin (LIPITOR) 80 MG tablet Take 80 mg by mouth every morning.    Historical Provider, MD  cetirizine (ZYRTEC) 10 MG tablet Take 10 mg by mouth at bedtime.    Historical Provider, MD  Cholecalciferol (VITAMIN D3) 2000 UNITS TABS Take 2,000 Units by mouth every morning.    Historical Provider,  MD  diazepam (VALIUM) 2 MG tablet Take 2 mg by mouth every 8 (eight) hours as needed for anxiety.    Historical Provider, MD  Dulaglutide (TRULICITY) 1.5 0000000 SOPN Inject 1.5 Units into the skin once a week.    Historical Provider, MD  fenofibrate 160 MG tablet Take 160 mg by mouth every morning.     Historical Provider, MD  HYDROcodone-acetaminophen (NORCO) 5-325 MG per tablet Take 1-2 tablets by mouth every 6 (six) hours as needed (for pain). 03/04/14   Merril Nagy L Ulices Maack, MD  insulin aspart (NOVOLOG) 100 UNIT/ML injection Inject into the skin 3 (three) times daily before meals. Sliding scale based on 1/3 of toal carbs eaten with meals    Historical Provider, MD  insulin glargine (LANTUS) 100 UNIT/ML injection Inject 40 Units into the skin 2 (two) times daily.     Historical Provider, MD  levofloxacin (LEVAQUIN) 750 MG tablet Take 1 tablet (750 mg total) by mouth daily. X 7 days 03/04/14   Karen Chafe Ricki Vanhandel, MD  losartan-hydrochlorothiazide (HYZAAR) 100-25 MG per tablet Take 0.5 tablets by mouth every morning.    Historical Provider, MD  metFORMIN (GLUCOPHAGE) 850 MG tablet Take 850 mg by mouth daily with breakfast.     Historical Provider, MD  Multiple Vitamin (MULTIVITAMIN) tablet Take 1 tablet by mouth every morning.     Historical Provider, MD  omeprazole (PRILOSEC) 20 MG capsule Take 20 mg by mouth 2 (two) times daily.    Historical Provider, MD  Polyethyl Glycol-Propyl Glycol (SYSTANE) 0.4-0.3 % SOLN Place 1 drop into both eyes daily as needed (For dry eyes.).    Historical Provider, MD  SYMBICORT 80-4.5 MCG/ACT inhaler Inhale 2 puffs into the lungs 2 (two) times daily. 03/03/13   Historical Provider, MD  vitamin C (ASCORBIC ACID) 500 MG tablet Take 500 mg by mouth every morning.     Historical Provider, MD   BP 148/93 mmHg  Pulse 93  Temp(Src) 98.4 F (36.9 C)  Resp 20  Ht 5\' 1"  (1.549 m)  Wt 227 lb (102.967 kg)  BMI 42.91 kg/m2  SpO2 96% Physical Exam  Nursing note and vitals reviewed.     General: Well-developed, well-nourished female in no acute distress; appearance consistent with age of record HENT: normocephalic; atraumatic Eyes: pupils equal, round and reactive to light; extraocular muscles intact Neck: supple Heart: regular rate and rhythm; no murmurs, rubs or gallops Lungs: clear to auscultation bilaterally Abdomen: soft; obese; nontender; bowel sounds present Extremities: No deformity; full range of motion; pulses normal; erythema and tenderness to the distal aspect of right lower leg and ankle, worse with ROM, TTP:   Neurologic: Awake, alert and oriented; motor function intact in all extremities and symmetric; no facial droop Skin: Warm and dry Psychiatric: Normal mood and affect    ED Course  Procedures (including critical care time) DIAGNOSTIC STUDIES: Oxygen Saturation is 96% on room air, normal by my interpretation.    COORDINATION OF CARE: 11:35 PM Discussed treatment plan with patient at beside, the patient agrees with the plan and has no further questions at this time.     MDM  Examination consistent with cellulitis not gout. There is not severe pain with movement of the right ankle joint. The erythema and warmth and located proximal to the joint. We will have her discontinue her Augmentin and started on Levaquin. She is already taking yogurt as a probiotic.   Final diagnoses:  Cellulitis of right lower leg   I personally performed the services described in this documentation, which was scribed in my presence. The recorded information has been reviewed and is accurate.    Wynetta Fines, MD 03/04/14 2350

## 2014-04-20 DIAGNOSIS — M109 Gout, unspecified: Secondary | ICD-10-CM | POA: Diagnosis not present

## 2014-04-20 DIAGNOSIS — F411 Generalized anxiety disorder: Secondary | ICD-10-CM | POA: Diagnosis not present

## 2014-04-20 DIAGNOSIS — J309 Allergic rhinitis, unspecified: Secondary | ICD-10-CM | POA: Diagnosis not present

## 2014-04-20 DIAGNOSIS — E1122 Type 2 diabetes mellitus with diabetic chronic kidney disease: Secondary | ICD-10-CM | POA: Diagnosis not present

## 2014-04-20 DIAGNOSIS — I129 Hypertensive chronic kidney disease with stage 1 through stage 4 chronic kidney disease, or unspecified chronic kidney disease: Secondary | ICD-10-CM | POA: Diagnosis not present

## 2014-04-20 DIAGNOSIS — G4733 Obstructive sleep apnea (adult) (pediatric): Secondary | ICD-10-CM | POA: Diagnosis not present

## 2014-04-20 DIAGNOSIS — H409 Unspecified glaucoma: Secondary | ICD-10-CM | POA: Diagnosis not present

## 2014-04-20 DIAGNOSIS — E781 Pure hyperglyceridemia: Secondary | ICD-10-CM | POA: Diagnosis not present

## 2014-04-20 DIAGNOSIS — Z6841 Body Mass Index (BMI) 40.0 and over, adult: Secondary | ICD-10-CM | POA: Diagnosis not present

## 2014-04-20 DIAGNOSIS — N183 Chronic kidney disease, stage 3 (moderate): Secondary | ICD-10-CM | POA: Diagnosis not present

## 2014-04-20 DIAGNOSIS — I517 Cardiomegaly: Secondary | ICD-10-CM | POA: Diagnosis not present

## 2014-06-29 DIAGNOSIS — Z0001 Encounter for general adult medical examination with abnormal findings: Secondary | ICD-10-CM | POA: Diagnosis not present

## 2014-06-29 DIAGNOSIS — G4733 Obstructive sleep apnea (adult) (pediatric): Secondary | ICD-10-CM | POA: Diagnosis not present

## 2014-06-29 DIAGNOSIS — E782 Mixed hyperlipidemia: Secondary | ICD-10-CM | POA: Diagnosis not present

## 2014-06-29 DIAGNOSIS — E1122 Type 2 diabetes mellitus with diabetic chronic kidney disease: Secondary | ICD-10-CM | POA: Diagnosis not present

## 2014-06-29 DIAGNOSIS — Z6841 Body Mass Index (BMI) 40.0 and over, adult: Secondary | ICD-10-CM | POA: Diagnosis not present

## 2014-06-29 DIAGNOSIS — F411 Generalized anxiety disorder: Secondary | ICD-10-CM | POA: Diagnosis not present

## 2014-06-29 DIAGNOSIS — M109 Gout, unspecified: Secondary | ICD-10-CM | POA: Diagnosis not present

## 2014-06-29 DIAGNOSIS — I129 Hypertensive chronic kidney disease with stage 1 through stage 4 chronic kidney disease, or unspecified chronic kidney disease: Secondary | ICD-10-CM | POA: Diagnosis not present

## 2014-06-29 DIAGNOSIS — N183 Chronic kidney disease, stage 3 (moderate): Secondary | ICD-10-CM | POA: Diagnosis not present

## 2014-06-29 DIAGNOSIS — H409 Unspecified glaucoma: Secondary | ICD-10-CM | POA: Diagnosis not present

## 2014-07-08 DIAGNOSIS — M25561 Pain in right knee: Secondary | ICD-10-CM | POA: Diagnosis not present

## 2014-07-08 DIAGNOSIS — M25562 Pain in left knee: Secondary | ICD-10-CM | POA: Diagnosis not present

## 2014-07-08 DIAGNOSIS — M170 Bilateral primary osteoarthritis of knee: Secondary | ICD-10-CM | POA: Diagnosis not present

## 2014-07-08 DIAGNOSIS — M1711 Unilateral primary osteoarthritis, right knee: Secondary | ICD-10-CM | POA: Diagnosis not present

## 2014-07-08 DIAGNOSIS — M1712 Unilateral primary osteoarthritis, left knee: Secondary | ICD-10-CM | POA: Diagnosis not present

## 2014-07-29 DIAGNOSIS — N183 Chronic kidney disease, stage 3 (moderate): Secondary | ICD-10-CM | POA: Diagnosis not present

## 2014-07-29 DIAGNOSIS — E1122 Type 2 diabetes mellitus with diabetic chronic kidney disease: Secondary | ICD-10-CM | POA: Diagnosis not present

## 2014-07-29 DIAGNOSIS — R195 Other fecal abnormalities: Secondary | ICD-10-CM | POA: Diagnosis not present

## 2014-07-29 DIAGNOSIS — R1013 Epigastric pain: Secondary | ICD-10-CM | POA: Diagnosis not present

## 2014-07-29 DIAGNOSIS — R142 Eructation: Secondary | ICD-10-CM | POA: Diagnosis not present

## 2014-07-29 DIAGNOSIS — M1990 Unspecified osteoarthritis, unspecified site: Secondary | ICD-10-CM | POA: Diagnosis not present

## 2014-07-29 DIAGNOSIS — R110 Nausea: Secondary | ICD-10-CM | POA: Diagnosis not present

## 2014-07-29 DIAGNOSIS — K219 Gastro-esophageal reflux disease without esophagitis: Secondary | ICD-10-CM | POA: Diagnosis not present

## 2014-09-29 DIAGNOSIS — I129 Hypertensive chronic kidney disease with stage 1 through stage 4 chronic kidney disease, or unspecified chronic kidney disease: Secondary | ICD-10-CM | POA: Diagnosis not present

## 2014-09-29 DIAGNOSIS — E1122 Type 2 diabetes mellitus with diabetic chronic kidney disease: Secondary | ICD-10-CM | POA: Diagnosis not present

## 2014-09-29 DIAGNOSIS — Z794 Long term (current) use of insulin: Secondary | ICD-10-CM | POA: Diagnosis not present

## 2014-09-29 DIAGNOSIS — N183 Chronic kidney disease, stage 3 (moderate): Secondary | ICD-10-CM | POA: Diagnosis not present

## 2014-09-29 DIAGNOSIS — Z6841 Body Mass Index (BMI) 40.0 and over, adult: Secondary | ICD-10-CM | POA: Diagnosis not present

## 2014-09-29 DIAGNOSIS — E782 Mixed hyperlipidemia: Secondary | ICD-10-CM | POA: Diagnosis not present

## 2014-09-29 DIAGNOSIS — J309 Allergic rhinitis, unspecified: Secondary | ICD-10-CM | POA: Diagnosis not present

## 2014-10-08 DIAGNOSIS — L03115 Cellulitis of right lower limb: Secondary | ICD-10-CM | POA: Diagnosis not present

## 2014-10-12 DIAGNOSIS — E1122 Type 2 diabetes mellitus with diabetic chronic kidney disease: Secondary | ICD-10-CM | POA: Diagnosis not present

## 2014-10-12 DIAGNOSIS — R55 Syncope and collapse: Secondary | ICD-10-CM | POA: Diagnosis not present

## 2014-10-12 DIAGNOSIS — Z794 Long term (current) use of insulin: Secondary | ICD-10-CM | POA: Diagnosis not present

## 2014-10-26 DIAGNOSIS — M1711 Unilateral primary osteoarthritis, right knee: Secondary | ICD-10-CM | POA: Diagnosis not present

## 2014-11-29 DIAGNOSIS — M1711 Unilateral primary osteoarthritis, right knee: Secondary | ICD-10-CM | POA: Diagnosis not present

## 2014-12-06 DIAGNOSIS — M1711 Unilateral primary osteoarthritis, right knee: Secondary | ICD-10-CM | POA: Diagnosis not present

## 2014-12-13 DIAGNOSIS — M1711 Unilateral primary osteoarthritis, right knee: Secondary | ICD-10-CM | POA: Diagnosis not present

## 2014-12-19 DIAGNOSIS — Z23 Encounter for immunization: Secondary | ICD-10-CM | POA: Diagnosis not present

## 2015-03-01 DIAGNOSIS — J209 Acute bronchitis, unspecified: Secondary | ICD-10-CM | POA: Diagnosis not present

## 2015-03-01 DIAGNOSIS — E1122 Type 2 diabetes mellitus with diabetic chronic kidney disease: Secondary | ICD-10-CM | POA: Diagnosis not present

## 2015-03-01 DIAGNOSIS — E782 Mixed hyperlipidemia: Secondary | ICD-10-CM | POA: Diagnosis not present

## 2015-03-01 DIAGNOSIS — I129 Hypertensive chronic kidney disease with stage 1 through stage 4 chronic kidney disease, or unspecified chronic kidney disease: Secondary | ICD-10-CM | POA: Diagnosis not present

## 2015-03-01 DIAGNOSIS — N183 Chronic kidney disease, stage 3 (moderate): Secondary | ICD-10-CM | POA: Diagnosis not present

## 2015-03-01 DIAGNOSIS — Z7984 Long term (current) use of oral hypoglycemic drugs: Secondary | ICD-10-CM | POA: Diagnosis not present

## 2015-03-01 DIAGNOSIS — Z794 Long term (current) use of insulin: Secondary | ICD-10-CM | POA: Diagnosis not present

## 2015-03-01 DIAGNOSIS — Z6841 Body Mass Index (BMI) 40.0 and over, adult: Secondary | ICD-10-CM | POA: Diagnosis not present

## 2015-03-15 DIAGNOSIS — R8290 Unspecified abnormal findings in urine: Secondary | ICD-10-CM | POA: Diagnosis not present

## 2015-07-12 DIAGNOSIS — E1122 Type 2 diabetes mellitus with diabetic chronic kidney disease: Secondary | ICD-10-CM | POA: Diagnosis not present

## 2015-07-12 DIAGNOSIS — Z0000 Encounter for general adult medical examination without abnormal findings: Secondary | ICD-10-CM | POA: Diagnosis not present

## 2015-07-17 DIAGNOSIS — Z1231 Encounter for screening mammogram for malignant neoplasm of breast: Secondary | ICD-10-CM | POA: Diagnosis not present

## 2015-07-21 DIAGNOSIS — E870 Hyperosmolality and hypernatremia: Secondary | ICD-10-CM | POA: Diagnosis not present

## 2015-08-03 DIAGNOSIS — Z780 Asymptomatic menopausal state: Secondary | ICD-10-CM | POA: Diagnosis not present

## 2015-08-06 DIAGNOSIS — M79671 Pain in right foot: Secondary | ICD-10-CM | POA: Diagnosis not present

## 2015-10-26 DIAGNOSIS — L0390 Cellulitis, unspecified: Secondary | ICD-10-CM | POA: Diagnosis not present

## 2015-10-26 DIAGNOSIS — M792 Neuralgia and neuritis, unspecified: Secondary | ICD-10-CM | POA: Diagnosis not present

## 2015-11-22 ENCOUNTER — Ambulatory Visit
Admission: RE | Admit: 2015-11-22 | Discharge: 2015-11-22 | Disposition: A | Discharge status: Home / Self Care | Payer: BLUE CROSS/BLUE SHIELD | Source: Ambulatory Visit | Attending: Family Medicine | Admitting: Family Medicine

## 2015-11-22 ENCOUNTER — Other Ambulatory Visit: Payer: Self-pay | Admitting: Family Medicine

## 2015-11-22 DIAGNOSIS — E1122 Type 2 diabetes mellitus with diabetic chronic kidney disease: Secondary | ICD-10-CM | POA: Diagnosis not present

## 2015-11-22 DIAGNOSIS — S92352D Displaced fracture of fifth metatarsal bone, left foot, subsequent encounter for fracture with routine healing: Secondary | ICD-10-CM | POA: Diagnosis not present

## 2015-11-22 DIAGNOSIS — Z7984 Long term (current) use of oral hypoglycemic drugs: Secondary | ICD-10-CM | POA: Diagnosis not present

## 2015-11-22 DIAGNOSIS — R52 Pain, unspecified: Secondary | ICD-10-CM

## 2015-11-22 DIAGNOSIS — M79672 Pain in left foot: Secondary | ICD-10-CM | POA: Diagnosis not present

## 2015-11-23 DIAGNOSIS — M1711 Unilateral primary osteoarthritis, right knee: Secondary | ICD-10-CM | POA: Diagnosis not present

## 2015-12-26 DIAGNOSIS — M1711 Unilateral primary osteoarthritis, right knee: Secondary | ICD-10-CM | POA: Diagnosis not present

## 2015-12-27 DIAGNOSIS — J309 Allergic rhinitis, unspecified: Secondary | ICD-10-CM | POA: Diagnosis not present

## 2016-01-02 DIAGNOSIS — M1711 Unilateral primary osteoarthritis, right knee: Secondary | ICD-10-CM | POA: Diagnosis not present

## 2016-01-09 DIAGNOSIS — M1711 Unilateral primary osteoarthritis, right knee: Secondary | ICD-10-CM | POA: Diagnosis not present

## 2016-01-12 DIAGNOSIS — E782 Mixed hyperlipidemia: Secondary | ICD-10-CM | POA: Diagnosis not present

## 2016-01-12 DIAGNOSIS — E1122 Type 2 diabetes mellitus with diabetic chronic kidney disease: Secondary | ICD-10-CM | POA: Diagnosis not present

## 2016-01-12 DIAGNOSIS — I129 Hypertensive chronic kidney disease with stage 1 through stage 4 chronic kidney disease, or unspecified chronic kidney disease: Secondary | ICD-10-CM | POA: Diagnosis not present

## 2016-01-12 DIAGNOSIS — Z7984 Long term (current) use of oral hypoglycemic drugs: Secondary | ICD-10-CM | POA: Diagnosis not present

## 2016-01-12 DIAGNOSIS — Z23 Encounter for immunization: Secondary | ICD-10-CM | POA: Diagnosis not present

## 2016-01-12 DIAGNOSIS — G4733 Obstructive sleep apnea (adult) (pediatric): Secondary | ICD-10-CM | POA: Diagnosis not present

## 2016-01-12 DIAGNOSIS — N183 Chronic kidney disease, stage 3 (moderate): Secondary | ICD-10-CM | POA: Diagnosis not present

## 2016-01-26 DIAGNOSIS — I129 Hypertensive chronic kidney disease with stage 1 through stage 4 chronic kidney disease, or unspecified chronic kidney disease: Secondary | ICD-10-CM | POA: Diagnosis not present

## 2016-02-15 DIAGNOSIS — R350 Frequency of micturition: Secondary | ICD-10-CM | POA: Diagnosis not present

## 2016-03-18 DIAGNOSIS — E119 Type 2 diabetes mellitus without complications: Secondary | ICD-10-CM | POA: Diagnosis not present

## 2016-03-18 DIAGNOSIS — H40033 Anatomical narrow angle, bilateral: Secondary | ICD-10-CM | POA: Diagnosis not present

## 2016-05-06 DIAGNOSIS — B349 Viral infection, unspecified: Secondary | ICD-10-CM | POA: Diagnosis not present

## 2016-05-22 DIAGNOSIS — K529 Noninfective gastroenteritis and colitis, unspecified: Secondary | ICD-10-CM | POA: Diagnosis not present

## 2016-05-22 DIAGNOSIS — J069 Acute upper respiratory infection, unspecified: Secondary | ICD-10-CM | POA: Diagnosis not present

## 2016-08-13 ENCOUNTER — Other Ambulatory Visit: Payer: Self-pay | Admitting: Family Medicine

## 2016-08-13 DIAGNOSIS — E1122 Type 2 diabetes mellitus with diabetic chronic kidney disease: Secondary | ICD-10-CM | POA: Diagnosis not present

## 2016-08-13 DIAGNOSIS — R1011 Right upper quadrant pain: Secondary | ICD-10-CM

## 2016-08-13 DIAGNOSIS — Z0000 Encounter for general adult medical examination without abnormal findings: Secondary | ICD-10-CM | POA: Diagnosis not present

## 2016-08-13 DIAGNOSIS — R8299 Other abnormal findings in urine: Secondary | ICD-10-CM | POA: Diagnosis not present

## 2016-08-13 DIAGNOSIS — Z1231 Encounter for screening mammogram for malignant neoplasm of breast: Secondary | ICD-10-CM | POA: Diagnosis not present

## 2016-08-19 ENCOUNTER — Other Ambulatory Visit: Payer: Self-pay | Admitting: Family Medicine

## 2016-08-19 ENCOUNTER — Ambulatory Visit
Admission: RE | Admit: 2016-08-19 | Discharge: 2016-08-19 | Disposition: A | Discharge status: Home / Self Care | Payer: BLUE CROSS/BLUE SHIELD | Source: Ambulatory Visit | Attending: Family Medicine | Admitting: Family Medicine

## 2016-08-19 DIAGNOSIS — M25511 Pain in right shoulder: Secondary | ICD-10-CM | POA: Diagnosis not present

## 2016-08-19 DIAGNOSIS — L0390 Cellulitis, unspecified: Secondary | ICD-10-CM | POA: Diagnosis not present

## 2016-08-19 DIAGNOSIS — K8020 Calculus of gallbladder without cholecystitis without obstruction: Secondary | ICD-10-CM | POA: Diagnosis not present

## 2016-08-23 ENCOUNTER — Other Ambulatory Visit: Payer: BLUE CROSS/BLUE SHIELD

## 2016-08-28 ENCOUNTER — Ambulatory Visit
Admission: RE | Admit: 2016-08-28 | Discharge: 2016-08-28 | Disposition: A | Discharge status: Home / Self Care | Payer: BLUE CROSS/BLUE SHIELD | Source: Ambulatory Visit | Attending: Family Medicine | Admitting: Family Medicine

## 2016-08-28 DIAGNOSIS — Z9049 Acquired absence of other specified parts of digestive tract: Secondary | ICD-10-CM | POA: Diagnosis not present

## 2016-08-28 DIAGNOSIS — R1011 Right upper quadrant pain: Secondary | ICD-10-CM

## 2016-12-04 DIAGNOSIS — I129 Hypertensive chronic kidney disease with stage 1 through stage 4 chronic kidney disease, or unspecified chronic kidney disease: Secondary | ICD-10-CM | POA: Diagnosis not present

## 2016-12-04 DIAGNOSIS — Z794 Long term (current) use of insulin: Secondary | ICD-10-CM | POA: Diagnosis not present

## 2016-12-04 DIAGNOSIS — G4733 Obstructive sleep apnea (adult) (pediatric): Secondary | ICD-10-CM | POA: Diagnosis not present

## 2016-12-04 DIAGNOSIS — N183 Chronic kidney disease, stage 3 (moderate): Secondary | ICD-10-CM | POA: Diagnosis not present

## 2016-12-04 DIAGNOSIS — F411 Generalized anxiety disorder: Secondary | ICD-10-CM | POA: Diagnosis not present

## 2016-12-04 DIAGNOSIS — E1122 Type 2 diabetes mellitus with diabetic chronic kidney disease: Secondary | ICD-10-CM | POA: Diagnosis not present

## 2016-12-04 DIAGNOSIS — M109 Gout, unspecified: Secondary | ICD-10-CM | POA: Diagnosis not present

## 2016-12-04 DIAGNOSIS — E782 Mixed hyperlipidemia: Secondary | ICD-10-CM | POA: Diagnosis not present

## 2016-12-04 DIAGNOSIS — J069 Acute upper respiratory infection, unspecified: Secondary | ICD-10-CM | POA: Diagnosis not present

## 2016-12-10 DIAGNOSIS — M170 Bilateral primary osteoarthritis of knee: Secondary | ICD-10-CM | POA: Diagnosis not present

## 2016-12-10 DIAGNOSIS — M1712 Unilateral primary osteoarthritis, left knee: Secondary | ICD-10-CM | POA: Diagnosis not present

## 2016-12-10 DIAGNOSIS — M1711 Unilateral primary osteoarthritis, right knee: Secondary | ICD-10-CM | POA: Diagnosis not present

## 2017-01-16 DIAGNOSIS — Z23 Encounter for immunization: Secondary | ICD-10-CM | POA: Diagnosis not present

## 2017-01-21 DIAGNOSIS — M1712 Unilateral primary osteoarthritis, left knee: Secondary | ICD-10-CM | POA: Diagnosis not present

## 2017-01-21 DIAGNOSIS — M1711 Unilateral primary osteoarthritis, right knee: Secondary | ICD-10-CM | POA: Diagnosis not present

## 2017-01-21 DIAGNOSIS — M170 Bilateral primary osteoarthritis of knee: Secondary | ICD-10-CM | POA: Diagnosis not present

## 2017-01-28 DIAGNOSIS — M170 Bilateral primary osteoarthritis of knee: Secondary | ICD-10-CM | POA: Diagnosis not present

## 2017-02-04 DIAGNOSIS — M170 Bilateral primary osteoarthritis of knee: Secondary | ICD-10-CM | POA: Diagnosis not present

## 2017-02-04 DIAGNOSIS — M1711 Unilateral primary osteoarthritis, right knee: Secondary | ICD-10-CM | POA: Diagnosis not present

## 2017-02-07 DIAGNOSIS — J069 Acute upper respiratory infection, unspecified: Secondary | ICD-10-CM | POA: Diagnosis not present

## 2017-02-07 DIAGNOSIS — R05 Cough: Secondary | ICD-10-CM | POA: Diagnosis not present

## 2017-02-07 DIAGNOSIS — R062 Wheezing: Secondary | ICD-10-CM | POA: Diagnosis not present

## 2017-03-14 DIAGNOSIS — E782 Mixed hyperlipidemia: Secondary | ICD-10-CM | POA: Diagnosis not present

## 2017-03-14 DIAGNOSIS — Z6841 Body Mass Index (BMI) 40.0 and over, adult: Secondary | ICD-10-CM | POA: Diagnosis not present

## 2017-03-14 DIAGNOSIS — N183 Chronic kidney disease, stage 3 (moderate): Secondary | ICD-10-CM | POA: Diagnosis not present

## 2017-03-14 DIAGNOSIS — E1122 Type 2 diabetes mellitus with diabetic chronic kidney disease: Secondary | ICD-10-CM | POA: Diagnosis not present

## 2017-03-14 DIAGNOSIS — I129 Hypertensive chronic kidney disease with stage 1 through stage 4 chronic kidney disease, or unspecified chronic kidney disease: Secondary | ICD-10-CM | POA: Diagnosis not present

## 2017-03-14 DIAGNOSIS — Z794 Long term (current) use of insulin: Secondary | ICD-10-CM | POA: Diagnosis not present

## 2017-03-14 DIAGNOSIS — Z7984 Long term (current) use of oral hypoglycemic drugs: Secondary | ICD-10-CM | POA: Diagnosis not present

## 2017-04-15 DIAGNOSIS — H1130 Conjunctival hemorrhage, unspecified eye: Secondary | ICD-10-CM | POA: Diagnosis not present

## 2017-05-13 ENCOUNTER — Ambulatory Visit: Payer: Self-pay

## 2017-05-13 ENCOUNTER — Other Ambulatory Visit: Payer: Self-pay | Admitting: Occupational Medicine

## 2017-05-13 DIAGNOSIS — M79671 Pain in right foot: Secondary | ICD-10-CM

## 2017-07-23 DIAGNOSIS — I8310 Varicose veins of unspecified lower extremity with inflammation: Secondary | ICD-10-CM | POA: Diagnosis not present

## 2017-07-23 DIAGNOSIS — R609 Edema, unspecified: Secondary | ICD-10-CM | POA: Diagnosis not present

## 2017-08-19 DIAGNOSIS — E1122 Type 2 diabetes mellitus with diabetic chronic kidney disease: Secondary | ICD-10-CM | POA: Diagnosis not present

## 2017-08-19 DIAGNOSIS — Z0000 Encounter for general adult medical examination without abnormal findings: Secondary | ICD-10-CM | POA: Diagnosis not present

## 2017-08-20 DIAGNOSIS — N939 Abnormal uterine and vaginal bleeding, unspecified: Secondary | ICD-10-CM | POA: Diagnosis not present

## 2017-08-20 DIAGNOSIS — N950 Postmenopausal bleeding: Secondary | ICD-10-CM | POA: Diagnosis not present

## 2017-09-03 ENCOUNTER — Other Ambulatory Visit: Payer: Self-pay | Admitting: Obstetrics and Gynecology

## 2017-09-03 ENCOUNTER — Other Ambulatory Visit (HOSPITAL_COMMUNITY)
Admission: RE | Admit: 2017-09-03 | Discharge: 2017-09-03 | Disposition: A | Discharge status: Home / Self Care | Payer: BLUE CROSS/BLUE SHIELD | Source: Ambulatory Visit | Attending: Obstetrics and Gynecology | Admitting: Obstetrics and Gynecology

## 2017-09-03 DIAGNOSIS — N858 Other specified noninflammatory disorders of uterus: Secondary | ICD-10-CM | POA: Diagnosis not present

## 2017-09-03 DIAGNOSIS — Z01411 Encounter for gynecological examination (general) (routine) with abnormal findings: Secondary | ICD-10-CM | POA: Insufficient documentation

## 2017-09-03 DIAGNOSIS — N83291 Other ovarian cyst, right side: Secondary | ICD-10-CM | POA: Diagnosis not present

## 2017-09-03 DIAGNOSIS — R9389 Abnormal findings on diagnostic imaging of other specified body structures: Secondary | ICD-10-CM | POA: Diagnosis not present

## 2017-09-03 DIAGNOSIS — N8501 Benign endometrial hyperplasia: Secondary | ICD-10-CM | POA: Diagnosis not present

## 2017-09-03 DIAGNOSIS — Z01419 Encounter for gynecological examination (general) (routine) without abnormal findings: Secondary | ICD-10-CM | POA: Diagnosis not present

## 2017-09-09 LAB — CYTOLOGY - PAP: HPV (WINDOPATH): NOT DETECTED

## 2017-09-15 ENCOUNTER — Telehealth: Payer: Self-pay | Admitting: *Deleted

## 2017-09-15 NOTE — Telephone Encounter (Signed)
Called and left the patient a message to call the office back. Need to try and move appt to this week

## 2017-09-16 ENCOUNTER — Telehealth: Payer: Self-pay | Admitting: *Deleted

## 2017-09-16 NOTE — Telephone Encounter (Signed)
Returned the patient's call and moved her new patient appt from 6/28 to 6/19

## 2017-09-17 ENCOUNTER — Telehealth: Payer: Self-pay

## 2017-09-17 ENCOUNTER — Encounter: Payer: Self-pay | Admitting: Gynecologic Oncology

## 2017-09-17 ENCOUNTER — Inpatient Hospital Stay: Payer: BLUE CROSS/BLUE SHIELD

## 2017-09-17 ENCOUNTER — Inpatient Hospital Stay: Payer: BLUE CROSS/BLUE SHIELD | Attending: Gynecologic Oncology | Admitting: Gynecologic Oncology

## 2017-09-17 VITALS — BP 135/56 | HR 93 | Temp 98.4°F | Resp 20 | Ht 61.0 in | Wt 228.0 lb

## 2017-09-17 DIAGNOSIS — N9489 Other specified conditions associated with female genital organs and menstrual cycle: Secondary | ICD-10-CM

## 2017-09-17 DIAGNOSIS — R971 Elevated cancer antigen 125 [CA 125]: Secondary | ICD-10-CM | POA: Diagnosis not present

## 2017-09-17 DIAGNOSIS — N950 Postmenopausal bleeding: Secondary | ICD-10-CM | POA: Diagnosis not present

## 2017-09-17 DIAGNOSIS — R1909 Other intra-abdominal and pelvic swelling, mass and lump: Secondary | ICD-10-CM

## 2017-09-17 DIAGNOSIS — D4959 Neoplasm of unspecified behavior of other genitourinary organ: Secondary | ICD-10-CM | POA: Diagnosis not present

## 2017-09-17 DIAGNOSIS — N949 Unspecified condition associated with female genital organs and menstrual cycle: Secondary | ICD-10-CM

## 2017-09-17 LAB — BASIC METABOLIC PANEL
ANION GAP: 11 (ref 3–11)
BUN: 36 mg/dL — ABNORMAL HIGH (ref 7–26)
CALCIUM: 10 mg/dL (ref 8.4–10.4)
CHLORIDE: 112 mmol/L — AB (ref 98–109)
CO2: 23 mmol/L (ref 22–29)
Creatinine, Ser: 1.55 mg/dL — ABNORMAL HIGH (ref 0.60–1.10)
GFR calc non Af Amer: 33 mL/min — ABNORMAL LOW (ref >60)
GFR, EST AFRICAN AMERICAN: 38 mL/min — AB (ref >60)
Glucose, Bld: 191 mg/dL — ABNORMAL HIGH (ref 70–140)
Potassium: 4.8 mmol/L (ref 3.5–5.1)
Sodium: 146 mmol/L — ABNORMAL HIGH (ref 136–145)

## 2017-09-17 NOTE — Telephone Encounter (Signed)
Pt walked over to lab for appt- Joylene John NP wanted me to remind pt "No Asprin-stop taking".  Pt voiced understanding. I let pt know after her surgery, we would let her know when she could resume.

## 2017-09-17 NOTE — Progress Notes (Signed)
Consult Note: Gyn-Onc  Consult was requested by Dr. Simona Huh for the evaluation of SEAIRRA OTANI 72 y.o. female  CC:  Chief Complaint  Patient presents with  . Adnexal mass    Assessment/Plan:  Ms. ZAHIRA BRUMMOND  is a 72 y.o.  year old with a 4cm complex right adnexal cystic mass, elevated CA 125 and postmenopausal bleeding with AGUS pap (HPV negative).  I discussed with Ms Horkey that there is some concerning data for possible malignancy of the ovary or uterus. It is reassuring that the endometrial biopsy was negative, however, this can be sampling error.   I am recommending CEA given the right sided nature of the cystic mass (though she has a history of appendectomy).  I am ordering a CT abd/pelvis to evaluate the upper abdomen and other visceral structures given the elevation in CA 125.  I gave Ms Natzke the option of either close follow-up with repeat imaging and labs in 3 months, or proceeding with definitive surgery (robotic hysterectomy, BSO, possible staging) now in order to obtain definitive pathology. I explained that she is at a particularly high surgical risk given her morbid obesity which she carries predominantly in her abdomen. I explained surgical risks are  bleeding, infection, damage to internal organs (such as bladder,ureters, bowels), blood clot, reoperation and rehospitalization. However, these risks are higher for her given her obesity.  After hearing her options she is electing to proceed with surgery. We have this scheduled for July 2nd. I explained the procedure, its risks and anticipated recovery.    HPI: Ms Nissa Stannard is a 72 year old P2 who is seen in consultation at the request of Dr Simona Huh for a right adnexal mass, elevated CA 125, postmenopausal bleeding, AGUS pap.  The patient is morbidly obese and has a history of 2 prior vaginal deliveries remotely. She is postmenopausal for approximately 20 years. She has always had normal paps (though last pap was  more than 5 years ago).  In May, 2019 she developed vaginal bleeding. She was seen by Dr Simona Huh on 09/03/17 and a TVUS was performed which revealed a uterus measuring 7.1 x 4.4x3.1cm with a thickened endometrium on 2cm. The endometrium was heterogeneous and had internal areas of associated vascularity.  The right ovary was not well delineated and contacted a complex cystic lesion measuring 4x3.7x3.3cm. There were a few scattered internal septations without appreciable vascularity.   Pap smear from 09/03/17 showed AGUS with HPV not detected.  CA 125 on 6/4//19 was 74.1  Endometrial biopsy on 09/03/17 showed fragments of benign endometrial tissue with eosinophilic change and papillary metaplasia and no atypia, hyperplasia or malignancy.    She reports a history of morbid obesity, hypertension (with associated LVH, last echo in 2015 showed EF of 60%). She has hypertensive nephropathy with stage 3 CKD. She hs severe lower extremity arthritis. She cannot walk up a flight of stairs due to knee pain and shortness of breath. She has no chest pain.  She has had an open cholecystectomy and appendectomy.  She takes baby ASA daily.   Her father and brother both died from pancreatic cancer. Her maternal aunt has a history of breast cancer.   Current Meds:  Outpatient Encounter Medications as of 09/17/2017  Medication Sig  . albuterol (PROVENTIL HFA;VENTOLIN HFA) 108 (90 BASE) MCG/ACT inhaler Inhale 2 puffs into the lungs every 4 (four) hours as needed for wheezing.  Marland Kitchen amLODipine (NORVASC) 5 MG tablet Take 5 mg by mouth every morning.   Marland Kitchen  aspirin EC 81 MG tablet Take 81 mg by mouth every morning.  . cetirizine (ZYRTEC) 10 MG tablet Take 10 mg by mouth at bedtime.  . fenofibrate 160 MG tablet Take 160 mg by mouth every morning.   . insulin aspart (NOVOLOG) 100 UNIT/ML injection Inject into the skin 3 (three) times daily before meals. Sliding scale based on 1/3 of toal carbs eaten with meals  . Insulin  Degludec (TRESIBA FLEXTOUCH) 200 UNIT/ML SOPN Inject into the skin. 200unit/ml 85 units as directed Subcutaneous once a day in am  . loratadine (CLARITIN) 10 MG tablet Take 10 mg by mouth daily.  Marland Kitchen losartan-hydrochlorothiazide (HYZAAR) 100-25 MG per tablet Take 0.5 tablets by mouth every morning.  . metFORMIN (GLUCOPHAGE) 850 MG tablet Take 850 mg by mouth daily with breakfast.   . omeprazole (PRILOSEC) 20 MG capsule Take 20 mg by mouth 2 (two) times daily.  . SYMBICORT 80-4.5 MCG/ACT inhaler Inhale 2 puffs into the lungs 2 (two) times daily.  . [DISCONTINUED] acetaminophen (TYLENOL) 500 MG tablet Take 1,000 mg by mouth every 6 (six) hours as needed for pain.  . [DISCONTINUED] atorvastatin (LIPITOR) 80 MG tablet Take 80 mg by mouth every morning.  . [DISCONTINUED] benzonatate (TESSALON) 100 MG capsule Take by mouth 3 (three) times daily as needed for cough.  . [DISCONTINUED] Cholecalciferol (VITAMIN D3) 2000 UNITS TABS Take 2,000 Units by mouth every morning.  . [DISCONTINUED] diazepam (VALIUM) 2 MG tablet Take 2 mg by mouth every 8 (eight) hours as needed for anxiety.  . [DISCONTINUED] Dulaglutide (TRULICITY) 1.5 YQ/6.5HQ SOPN Inject 1.5 Units into the skin once a week.  . [DISCONTINUED] fluticasone (FLONASE) 50 MCG/ACT nasal spray Place into both nostrils daily.  . [DISCONTINUED] HYDROcodone-acetaminophen (NORCO) 5-325 MG per tablet Take 1-2 tablets by mouth every 6 (six) hours as needed (for pain). (Patient not taking: Reported on 09/17/2017)  . [DISCONTINUED] insulin glargine (LANTUS) 100 UNIT/ML injection Inject 40 Units into the skin 2 (two) times daily.   . [DISCONTINUED] levofloxacin (LEVAQUIN) 750 MG tablet Take 1 tablet (750 mg total) by mouth daily. X 7 days (Patient not taking: Reported on 09/17/2017)  . [DISCONTINUED] Multiple Vitamin (MULTIVITAMIN) tablet Take 1 tablet by mouth every morning.   . [DISCONTINUED] Polyethyl Glycol-Propyl Glycol (SYSTANE) 0.4-0.3 % SOLN Place 1 drop into  both eyes daily as needed (For dry eyes.).  . [DISCONTINUED] vitamin C (ASCORBIC ACID) 500 MG tablet Take 500 mg by mouth every morning.    No facility-administered encounter medications on file as of 09/17/2017.     Allergy:  Allergies  Allergen Reactions  . Lipitor [Atorvastatin] Other (See Comments)    Leg cramps  . Sulfa Antibiotics     diarrhea  . Potassium-Containing Compounds Rash    Social Hx:   Social History   Socioeconomic History  . Marital status: Married    Spouse name: Not on file  . Number of children: Not on file  . Years of education: Not on file  . Highest education level: Not on file  Occupational History  . Not on file  Social Needs  . Financial resource strain: Not on file  . Food insecurity:    Worry: Not on file    Inability: Not on file  . Transportation needs:    Medical: Not on file    Non-medical: Not on file  Tobacco Use  . Smoking status: Never Smoker  . Smokeless tobacco: Never Used  Substance and Sexual Activity  . Alcohol use: No  .  Drug use: No  . Sexual activity: Yes  Lifestyle  . Physical activity:    Days per week: Not on file    Minutes per session: Not on file  . Stress: Not on file  Relationships  . Social connections:    Talks on phone: Not on file    Gets together: Not on file    Attends religious service: Not on file    Active member of club or organization: Not on file    Attends meetings of clubs or organizations: Not on file    Relationship status: Not on file  . Intimate partner violence:    Fear of current or ex partner: Not on file    Emotionally abused: Not on file    Physically abused: Not on file    Forced sexual activity: Not on file  Other Topics Concern  . Not on file  Social History Narrative  . Not on file    Past Surgical Hx:  Past Surgical History:  Procedure Laterality Date  . APPENDECTOMY    . CATARACT EXTRACTION W/PHACO Right 01/10/2014   Procedure: CATARACT EXTRACTION PHACO AND  INTRAOCULAR LENS PLACEMENT ([place]);  Surgeon: Tonny Branch, MD;  Location: [facility];  Service: Ophthalmology;  Laterality: Right;  CDE:5.78  . CATARACT EXTRACTION W/PHACO Left 01/27/2014   Procedure: CATARACT EXTRACTION PHACO AND INTRAOCULAR LENS PLACEMENT LEFT EYE CDE=5.75;  Surgeon: Tonny Branch, MD;  Location: [facility];  Service: Ophthalmology;  Laterality: Left;  . CHOLECYSTECTOMY    . COLONOSCOPY WITH PROPOFOL N/A 09/01/2012   Procedure: COLONOSCOPY WITH PROPOFOL;  Surgeon: Garlan Fair, MD;  Location: [facility];  Service: Endoscopy;  Laterality: N/A;    Past Medical Hx:  Past Medical History:  Diagnosis Date  . Anxiety   . Complication of anesthesia   . Diabetes mellitus without complication ([place])   . GERD (gastroesophageal reflux disease)   . History of blood transfusion    as child after accident, after childbirth/hemorroid surgery  . Hyperlipidemia   . Hypertension     Past Gynecological History:  SVD x 2 No LMP recorded. Patient is postmenopausal.  Family Hx:  Family History  Problem Relation Age of Onset  . Stroke Mother   . Asthma Father   . Colon cancer Father     Review of Systems:  Constitutional  Feels well,    ENT Normal appearing ears and nares bilaterally Skin/Breast  No rash, sores, jaundice, itching, dryness Cardiovascular  No chest pain, shortness of breath, or edema  Pulmonary  + SOB on exertion Gastro Intestinal  No nausea, vomitting, or diarrhoea. No bright red blood per rectum, no abdominal pain, change in bowel movement, or constipation.  Genito Urinary  No frequency, urgency, dysuria, + postmenopausal bleeding Musculo Skeletal  + lower extremity joint pains Neurologic   No weakness, numbness, change in gait,  Psychology  No depression, anxiety, insomnia.   Vitals:  Blood pressure (!) 135/56, pulse 93, temperature 98.4 F (36.9 C), temperature source Oral, resp. rate 20, height 5\' 1"  (1.549 m), weight 228 lb (103.4 kg), SpO2 98  %.  Physical Exam: WD in NAD Neck  Supple NROM, without any enlargements.  Lymph Node Survey No cervical supraclavicular or inguinal adenopathy Cardiovascular  Pulse normal rate, regularity and rhythm. S1 and S2 normal.  Lungs  Clear to auscultation bilateraly, without wheezes/crackles/rhonchi. Good air movement.  Skin  No rash/lesions/breakdown  Psychiatry  Alert and oriented to person, place, and time  Abdomen  Normoactive bowel sounds,  abdomen soft, non-tender and obese without evidence of hernia.  Back No CVA tenderness Genito Urinary  Vulva/vagina: Normal external female genitalia.  No lesions. No discharge or bleeding.  Bladder/urethra:  No lesions or masses, well supported bladder  Vagina: no lesions  Cervix: Normal appearing, no lesions. Atrophic and small and flush with vagina.  Uterus:  Small, mobile, no parametrial involvement or nodularity.  Adnexa: no palpable masses though exam limited by body habitus. Rectal  Good tone, no masses no cul de sac nodularity.  Extremities  No bilateral cyanosis, clubbing or edema.   Thereasa Solo, MD  09/17/2017, 3:46 PM

## 2017-09-17 NOTE — H&P (View-Only) (Signed)
Consult Note: Gyn-Onc  Consult was requested by Dr. Simona Huh for the evaluation of SERENAH MILL 72 y.o. female  CC:  Chief Complaint  Patient presents with  . Adnexal mass    Assessment/Plan:  Ms. INDYA OLIVERIA  is a 72 y.o.  year old with a 4cm complex right adnexal cystic mass, elevated CA 125 and postmenopausal bleeding with AGUS pap (HPV negative).  I discussed with Ms Lint that there is some concerning data for possible malignancy of the ovary or uterus. It is reassuring that the endometrial biopsy was negative, however, this can be sampling error.   I am recommending CEA given the right sided nature of the cystic mass (though she has a history of appendectomy).  I am ordering a CT abd/pelvis to evaluate the upper abdomen and other visceral structures given the elevation in CA 125.  I gave Ms Holik the option of either close follow-up with repeat imaging and labs in 3 months, or proceeding with definitive surgery (robotic hysterectomy, BSO, possible staging) now in order to obtain definitive pathology. I explained that she is at a particularly high surgical risk given her morbid obesity which she carries predominantly in her abdomen. I explained surgical risks are  bleeding, infection, damage to internal organs (such as bladder,ureters, bowels), blood clot, reoperation and rehospitalization. However, these risks are higher for her given her obesity.  After hearing her options she is electing to proceed with surgery. We have this scheduled for July 2nd. I explained the procedure, its risks and anticipated recovery.    HPI: Ms Janita Camberos is a 72 year old P2 who is seen in consultation at the request of Dr Simona Huh for a right adnexal mass, elevated CA 125, postmenopausal bleeding, AGUS pap.  The patient is morbidly obese and has a history of 2 prior vaginal deliveries remotely. She is postmenopausal for approximately 20 years. She has always had normal paps (though last pap was  more than 5 years ago).  In May, 2019 she developed vaginal bleeding. She was seen by Dr Simona Huh on 09/03/17 and a TVUS was performed which revealed a uterus measuring 7.1 x 4.4x3.1cm with a thickened endometrium on 2cm. The endometrium was heterogeneous and had internal areas of associated vascularity.  The right ovary was not well delineated and contacted a complex cystic lesion measuring 4x3.7x3.3cm. There were a few scattered internal septations without appreciable vascularity.   Pap smear from 09/03/17 showed AGUS with HPV not detected.  CA 125 on 6/4//19 was 74.1  Endometrial biopsy on 09/03/17 showed fragments of benign endometrial tissue with eosinophilic change and papillary metaplasia and no atypia, hyperplasia or malignancy.    She reports a history of morbid obesity, hypertension (with associated LVH, last echo in 2015 showed EF of 60%). She has hypertensive nephropathy with stage 3 CKD. She hs severe lower extremity arthritis. She cannot walk up a flight of stairs due to knee pain and shortness of breath. She has no chest pain.  She has had an open cholecystectomy and appendectomy.  She takes baby ASA daily.   Her father and brother both died from pancreatic cancer. Her maternal aunt has a history of breast cancer.   Current Meds:  Outpatient Encounter Medications as of 09/17/2017  Medication Sig  . albuterol (PROVENTIL HFA;VENTOLIN HFA) 108 (90 BASE) MCG/ACT inhaler Inhale 2 puffs into the lungs every 4 (four) hours as needed for wheezing.  Marland Kitchen amLODipine (NORVASC) 5 MG tablet Take 5 mg by mouth every morning.   Marland Kitchen  aspirin EC 81 MG tablet Take 81 mg by mouth every morning.  . cetirizine (ZYRTEC) 10 MG tablet Take 10 mg by mouth at bedtime.  . fenofibrate 160 MG tablet Take 160 mg by mouth every morning.   . insulin aspart (NOVOLOG) 100 UNIT/ML injection Inject into the skin 3 (three) times daily before meals. Sliding scale based on 1/3 of toal carbs eaten with meals  . Insulin  Degludec (TRESIBA FLEXTOUCH) 200 UNIT/ML SOPN Inject into the skin. 200unit/ml 85 units as directed Subcutaneous once a day in am  . loratadine (CLARITIN) 10 MG tablet Take 10 mg by mouth daily.  Marland Kitchen losartan-hydrochlorothiazide (HYZAAR) 100-25 MG per tablet Take 0.5 tablets by mouth every morning.  . metFORMIN (GLUCOPHAGE) 850 MG tablet Take 850 mg by mouth daily with breakfast.   . omeprazole (PRILOSEC) 20 MG capsule Take 20 mg by mouth 2 (two) times daily.  . SYMBICORT 80-4.5 MCG/ACT inhaler Inhale 2 puffs into the lungs 2 (two) times daily.  . [DISCONTINUED] acetaminophen (TYLENOL) 500 MG tablet Take 1,000 mg by mouth every 6 (six) hours as needed for pain.  . [DISCONTINUED] atorvastatin (LIPITOR) 80 MG tablet Take 80 mg by mouth every morning.  . [DISCONTINUED] benzonatate (TESSALON) 100 MG capsule Take by mouth 3 (three) times daily as needed for cough.  . [DISCONTINUED] Cholecalciferol (VITAMIN D3) 2000 UNITS TABS Take 2,000 Units by mouth every morning.  . [DISCONTINUED] diazepam (VALIUM) 2 MG tablet Take 2 mg by mouth every 8 (eight) hours as needed for anxiety.  . [DISCONTINUED] Dulaglutide (TRULICITY) 1.5 WF/0.9NA SOPN Inject 1.5 Units into the skin once a week.  . [DISCONTINUED] fluticasone (FLONASE) 50 MCG/ACT nasal spray Place into both nostrils daily.  . [DISCONTINUED] HYDROcodone-acetaminophen (NORCO) 5-325 MG per tablet Take 1-2 tablets by mouth every 6 (six) hours as needed (for pain). (Patient not taking: Reported on 09/17/2017)  . [DISCONTINUED] insulin glargine (LANTUS) 100 UNIT/ML injection Inject 40 Units into the skin 2 (two) times daily.   . [DISCONTINUED] levofloxacin (LEVAQUIN) 750 MG tablet Take 1 tablet (750 mg total) by mouth daily. X 7 days (Patient not taking: Reported on 09/17/2017)  . [DISCONTINUED] Multiple Vitamin (MULTIVITAMIN) tablet Take 1 tablet by mouth every morning.   . [DISCONTINUED] Polyethyl Glycol-Propyl Glycol (SYSTANE) 0.4-0.3 % SOLN Place 1 drop into  both eyes daily as needed (For dry eyes.).  . [DISCONTINUED] vitamin C (ASCORBIC ACID) 500 MG tablet Take 500 mg by mouth every morning.    No facility-administered encounter medications on file as of 09/17/2017.     Allergy:  Allergies  Allergen Reactions  . Lipitor [Atorvastatin] Other (See Comments)    Leg cramps  . Sulfa Antibiotics     diarrhea  . Potassium-Containing Compounds Rash    Social Hx:   Social History   Socioeconomic History  . Marital status: Married    Spouse name: Not on file  . Number of children: Not on file  . Years of education: Not on file  . Highest education level: Not on file  Occupational History  . Not on file  Social Needs  . Financial resource strain: Not on file  . Food insecurity:    Worry: Not on file    Inability: Not on file  . Transportation needs:    Medical: Not on file    Non-medical: Not on file  Tobacco Use  . Smoking status: Never Smoker  . Smokeless tobacco: Never Used  Substance and Sexual Activity  . Alcohol use: No  .  Drug use: No  . Sexual activity: Yes  Lifestyle  . Physical activity:    Days per week: Not on file    Minutes per session: Not on file  . Stress: Not on file  Relationships  . Social connections:    Talks on phone: Not on file    Gets together: Not on file    Attends religious service: Not on file    Active member of club or organization: Not on file    Attends meetings of clubs or organizations: Not on file    Relationship status: Not on file  . Intimate partner violence:    Fear of current or ex partner: Not on file    Emotionally abused: Not on file    Physically abused: Not on file    Forced sexual activity: Not on file  Other Topics Concern  . Not on file  Social History Narrative  . Not on file    Past Surgical Hx:  Past Surgical History:  Procedure Laterality Date  . APPENDECTOMY    . CATARACT EXTRACTION W/PHACO Right 01/10/2014   Procedure: CATARACT EXTRACTION PHACO AND  INTRAOCULAR LENS PLACEMENT ([place]);  Surgeon: Tonny Branch, MD;  Location: [facility];  Service: Ophthalmology;  Laterality: Right;  CDE:5.78  . CATARACT EXTRACTION W/PHACO Left 01/27/2014   Procedure: CATARACT EXTRACTION PHACO AND INTRAOCULAR LENS PLACEMENT LEFT EYE CDE=5.75;  Surgeon: Tonny Branch, MD;  Location: [facility];  Service: Ophthalmology;  Laterality: Left;  . CHOLECYSTECTOMY    . COLONOSCOPY WITH PROPOFOL N/A 09/01/2012   Procedure: COLONOSCOPY WITH PROPOFOL;  Surgeon: Garlan Fair, MD;  Location: [facility];  Service: Endoscopy;  Laterality: N/A;    Past Medical Hx:  Past Medical History:  Diagnosis Date  . Anxiety   . Complication of anesthesia   . Diabetes mellitus without complication ([place])   . GERD (gastroesophageal reflux disease)   . History of blood transfusion    as child after accident, after childbirth/hemorroid surgery  . Hyperlipidemia   . Hypertension     Past Gynecological History:  SVD x 2 No LMP recorded. Patient is postmenopausal.  Family Hx:  Family History  Problem Relation Age of Onset  . Stroke Mother   . Asthma Father   . Colon cancer Father     Review of Systems:  Constitutional  Feels well,    ENT Normal appearing ears and nares bilaterally Skin/Breast  No rash, sores, jaundice, itching, dryness Cardiovascular  No chest pain, shortness of breath, or edema  Pulmonary  + SOB on exertion Gastro Intestinal  No nausea, vomitting, or diarrhoea. No bright red blood per rectum, no abdominal pain, change in bowel movement, or constipation.  Genito Urinary  No frequency, urgency, dysuria, + postmenopausal bleeding Musculo Skeletal  + lower extremity joint pains Neurologic   No weakness, numbness, change in gait,  Psychology  No depression, anxiety, insomnia.   Vitals:  Blood pressure (!) 135/56, pulse 93, temperature 98.4 F (36.9 C), temperature source Oral, resp. rate 20, height 5\' 1"  (1.549 m), weight 228 lb (103.4 kg), SpO2 98  %.  Physical Exam: WD in NAD Neck  Supple NROM, without any enlargements.  Lymph Node Survey No cervical supraclavicular or inguinal adenopathy Cardiovascular  Pulse normal rate, regularity and rhythm. S1 and S2 normal.  Lungs  Clear to auscultation bilateraly, without wheezes/crackles/rhonchi. Good air movement.  Skin  No rash/lesions/breakdown  Psychiatry  Alert and oriented to person, place, and time  Abdomen  Normoactive bowel sounds,  abdomen soft, non-tender and obese without evidence of hernia.  Back No CVA tenderness Genito Urinary  Vulva/vagina: Normal external female genitalia.  No lesions. No discharge or bleeding.  Bladder/urethra:  No lesions or masses, well supported bladder  Vagina: no lesions  Cervix: Normal appearing, no lesions. Atrophic and small and flush with vagina.  Uterus:  Small, mobile, no parametrial involvement or nodularity.  Adnexa: no palpable masses though exam limited by body habitus. Rectal  Good tone, no masses no cul de sac nodularity.  Extremities  No bilateral cyanosis, clubbing or edema.   Thereasa Solo, MD  09/17/2017, 3:46 PM

## 2017-09-17 NOTE — Patient Instructions (Signed)
Preparing for your Surgery  Plan for surgery on September 30, 2017 with Dr. [name] at [place] will be scheduled for a robotic assisted total hysterectomy, bilateral salpingo-oophorectomy, possible staging.   Pre-operative Testing -You will receive a phone call from presurgical testing at [facility] to arrange for a pre-operative testing appointment before your surgery.  This appointment normally occurs one to two weeks before your scheduled surgery.   -Bring your insurance card, copy of an advanced directive if applicable, medication list  -At that visit, you will be asked to sign a consent for a possible blood transfusion in case a transfusion becomes necessary during surgery.  The need for a blood transfusion is rare but having consent is a necessary part of your care.     -You should not be taking blood thinners or aspirin at least ten days prior to surgery unless instructed by your surgeon.  Day Before Surgery at [facility] will be asked to take in a light diet the day before surgery.  Avoid carbonated beverages.  You will be advised to have nothing to eat or drink after midnight the evening before.    Eat a light diet the day before surgery.  Examples including soups, broths, toast, yogurt, mashed potatoes.  Things to avoid include carbonated beverages (fizzy beverages), raw fruits and raw vegetables, or beans.   If your bowels are filled with gas, your surgeon will have difficulty visualizing your pelvic organs which increases your surgical risks.  Your role in recovery Your role is to become active as soon as directed by your doctor, while still giving yourself time to heal.  Rest when you feel tired. You will be asked to do the following in order to speed your recovery:  - Cough and breathe deeply. This helps toclear and expand your lungs and can prevent pneumonia. You may be given a spirometer to practice deep breathing. A staff member will show  you how to use the spirometer. - Do mild physical activity. Walking or moving your legs help your circulation and body functions return to normal. A staff member will help you when you try to walk and will provide you with simple exercises. Do not try to get up or walk alone the first time. - Actively manage your pain. Managing your pain lets you move in comfort. We will ask you to rate your pain on a scale of zero to 10. It is your responsibility to tell your doctor or nurse where and how much you hurt so your pain can be treated.  Special Considerations -If you are diabetic, you may be placed on insulin after surgery to have closer control over your blood sugars to promote healing and recovery.  This does not mean that you will be discharged on insulin.  If applicable, your oral antidiabetics will be resumed when you are tolerating a solid diet.  -Your final pathology results from surgery should be available by the Friday after surgery and the results will be relayed to you when available.  -Dr. [name] is the Surgeon that assists your GYN Oncologist with surgery.  The next day after your surgery you will either see your GYN Oncologist or Dr. [name].   Blood Transfusion Information WHAT IS A BLOOD TRANSFUSION? A transfusion is the replacement of blood or some of its parts. Blood is made up of multiple cells which provide different functions.  Red blood cells carry oxygen and are used for blood loss  replacement.  White blood cells fight against infection.  Platelets control bleeding.  Plasma helps clot blood.  Other blood products are available for specialized needs, such as hemophilia or other clotting disorders. BEFORE THE TRANSFUSION  Who gives blood for transfusions?   You may be able to donate blood to be used at a later date on yourself (autologous donation).  Relatives can be asked to donate blood. This is generally not any safer than if you have received  blood from a stranger. The same precautions are taken to ensure safety when a relative's blood is donated.  Healthy volunteers who are fully evaluated to make sure their blood is safe. This is blood bank blood. Transfusion therapy is the safest it has ever been in the practice of medicine. Before blood is taken from a donor, a complete history is taken to make sure that person has no history of diseases nor engages in risky social behavior (examples are intravenous drug use or sexual activity with multiple partners). The donor's travel history is screened to minimize risk of transmitting infections, such as malaria. The donated blood is tested for signs of infectious diseases, such as HIV and hepatitis. The blood is then tested to be sure it is compatible with you in order to minimize the chance of a transfusion reaction. If you or a relative donates blood, this is often done in anticipation of surgery and is not appropriate for emergency situations. It takes many days to process the donated blood. RISKS AND COMPLICATIONS Although transfusion therapy is very safe and saves many lives, the main dangers of transfusion include:   Getting an infectious disease.  Developing a transfusion reaction. This is an allergic reaction to something in the blood you were given. Every precaution is taken to prevent this. The decision to have a blood transfusion has been considered carefully by your caregiver before blood is given. Blood is not given unless the benefits outweigh the risks.

## 2017-09-18 ENCOUNTER — Telehealth: Payer: Self-pay

## 2017-09-18 LAB — CEA (IN HOUSE-CHCC): CEA (CHCC-IN HOUSE): 2.05 ng/mL (ref 0.00–5.00)

## 2017-09-18 NOTE — Telephone Encounter (Signed)
Outgoing call to patient per Joylene John NP regarding pt's recent CEA tumor marker results are "normal", pt voiced understanding. And I let her know per Lenna Sciara NP - that her other blood work 'BMET' results would be routed to her PCP- pt said PCP is Dr Serita Grammes at [facility] at [place]. No other needs per pt at this time. Called [facility] "Santiago Glad faxed BMET to their office per [facility] NP.

## 2017-09-22 ENCOUNTER — Ambulatory Visit (HOSPITAL_COMMUNITY)
Admission: RE | Admit: 2017-09-22 | Discharge: 2017-09-22 | Disposition: A | Discharge status: Home / Self Care | Payer: BLUE CROSS/BLUE SHIELD | Source: Ambulatory Visit | Attending: Gynecologic Oncology | Admitting: Gynecologic Oncology

## 2017-09-22 DIAGNOSIS — K5730 Diverticulosis of large intestine without perforation or abscess without bleeding: Secondary | ICD-10-CM | POA: Insufficient documentation

## 2017-09-22 DIAGNOSIS — R1909 Other intra-abdominal and pelvic swelling, mass and lump: Secondary | ICD-10-CM | POA: Diagnosis not present

## 2017-09-22 DIAGNOSIS — K760 Fatty (change of) liver, not elsewhere classified: Secondary | ICD-10-CM | POA: Diagnosis not present

## 2017-09-22 MED ORDER — IOHEXOL 300 MG/ML  SOLN
75.0000 mL | Freq: Once | INTRAMUSCULAR | Status: AC | PRN
  Administered 2017-09-22: 75 mL via INTRAVENOUS

## 2017-09-23 ENCOUNTER — Encounter (HOSPITAL_COMMUNITY): Payer: Self-pay

## 2017-09-24 NOTE — Patient Instructions (Addendum)
Elizabeth Crosby  09/24/2017   Your procedure is scheduled on: 09-30-17   Report to [facility] Main  Entrance    Report to admitting at 7:00AM    Call this number if you have problems the morning of surgery [phone]     Remember: Eat a light diet the day before surgery. Examples including soups, broths, toast, yogurt, mashed potatoes. Things to avoid include carbonated beverages (fizzy beverages), raw fruits and raw vegetables, or beans. If your bowels are filled with gas, your surgeon will have difficulty visualizing your pelvic organs which increases your surgical risks.  Do not eat food or drink liquids :After Midnight.     Take these medicines the morning of surgery with A SIP OF WATER: AMLODIPINE, CETRIZINE, FENOFIBRATE, OMEPRAZOLE                                 You may not have any metal on your body including hair pins and              piercings  Do not wear jewelry, make-up, lotions, powders or perfumes, deodorant             Do not wear nail polish.  Do not shave  48 hours prior to surgery.     Do not bring valuables to the hospital. [place].  Contacts, dentures or bridgework may not be worn into surgery.  Leave suitcase in the car. After surgery it may be brought to your room.                   Please read over the following fact sheets you were given: _____________________________________________________________________             [facility] - Preparing for Surgery Before surgery, you can play an important role.  Because skin is not sterile, your skin needs to be as free of germs as possible.  You can reduce the number of germs on your skin by washing with CHG (chlorahexidine gluconate) soap before surgery.  CHG is an antiseptic cleaner which kills germs and bonds with the skin to continue killing germs even after washing. Please DO NOT use if you have an allergy to CHG or  antibacterial soaps.  If your skin becomes reddened/irritated stop using the CHG and inform your nurse when you arrive at Short Stay. Do not shave (including legs and underarms) for at least 48 hours prior to the first CHG shower.  You may shave your face/neck. Please follow these instructions carefully:  1.  Shower with CHG Soap the night before surgery and the  morning of Surgery.  2.  If you choose to wash your hair, wash your hair first as usual with your  normal  shampoo.  3.  After you shampoo, rinse your hair and body thoroughly to remove the  shampoo.                           4.  Use CHG as you would any other liquid soap.  You can apply chg directly  to the skin and wash  Gently with a scrungie or clean washcloth.  5.  Apply the CHG Soap to your body ONLY FROM THE NECK DOWN.   Do not use on face/ open                           Wound or open sores. Avoid contact with eyes, ears mouth and genitals (private parts).                       Wash face,  Genitals (private parts) with your normal soap.             6.  Wash thoroughly, paying special attention to the area where your surgery  will be performed.  7.  Thoroughly rinse your body with warm water from the neck down.  8.  DO NOT shower/wash with your normal soap after using and rinsing off  the CHG Soap.                9.  Pat yourself dry with a clean towel.            10.  Wear clean pajamas.            11.  Place clean sheets on your bed the night of your first shower and do not  sleep with pets. Day of Surgery : Do not apply any lotions/deodorants the morning of surgery.  Please wear clean clothes to the hospital/surgery center.  FAILURE TO FOLLOW THESE INSTRUCTIONS MAY RESULT IN THE CANCELLATION OF YOUR SURGERY PATIENT SIGNATURE_________________________________  NURSE SIGNATURE__________________________________  ________________________________________________________________________   Adam Phenix  An incentive spirometer is a tool that can help keep your lungs clear and active. This tool measures how well you are filling your lungs with each breath. Taking long deep breaths may help reverse or decrease the chance of developing breathing (pulmonary) problems (especially infection) following:  A long period of time when you are unable to move or be active. BEFORE THE PROCEDURE   If the spirometer includes an indicator to show your best effort, your nurse or respiratory therapist will set it to a desired goal.  If possible, sit up straight or lean slightly forward. Try not to slouch.  Hold the incentive spirometer in an upright position. INSTRUCTIONS FOR USE  1. Sit on the edge of your bed if possible, or sit up as far as you can in bed or on a chair. 2. Hold the incentive spirometer in an upright position. 3. Breathe out normally. 4. Place the mouthpiece in your mouth and seal your lips tightly around it. 5. Breathe in slowly and as deeply as possible, raising the piston or the ball toward the top of the column. 6. Hold your breath for 3-5 seconds or for as long as possible. Allow the piston or ball to fall to the bottom of the column. 7. Remove the mouthpiece from your mouth and breathe out normally. 8. Rest for a few seconds and repeat Steps 1 through 7 at least 10 times every 1-2 hours when you are awake. Take your time and take a few normal breaths between deep breaths. 9. The spirometer may include an indicator to show your best effort. Use the indicator as a goal to work toward during each repetition. 10. After each set of 10 deep breaths, practice coughing to be sure your lungs are clear. If you have an incision (the cut made at the time of surgery),  support your incision when coughing by placing a pillow or rolled up towels firmly against it. Once you are able to get out of bed, walk around indoors and cough well. You may stop using the incentive spirometer when  instructed by your caregiver.  RISKS AND COMPLICATIONS  Take your time so you do not get dizzy or light-headed.  If you are in pain, you may need to take or ask for pain medication before doing incentive spirometry. It is harder to take a deep breath if you are having pain. AFTER USE  Rest and breathe slowly and easily.  It can be helpful to keep track of a log of your progress. Your caregiver can provide you with a simple table to help with this. If you are using the spirometer at home, follow these instructions: [place] IF:   You are having difficultly using the spirometer.  You have trouble using the spirometer as often as instructed.  Your pain medication is not giving enough relief while using the spirometer.  You develop fever of 100.5 F (38.1 C) or higher. SEEK IMMEDIATE MEDICAL CARE IF:   You cough up bloody sputum that had not been present before.  You develop fever of 102 F (38.9 C) or greater.  You develop worsening pain at or near the incision site. MAKE SURE YOU:   Understand these instructions.  Will watch your condition.  Will get help right away if you are not doing well or get worse. Document Released: 07/29/2006 Document Revised: 06/10/2011 Document Reviewed: 09/29/2006 ExitCare Patient Information 2014 ExitCare, [place].   ________________________________________________________________________  WHAT IS A BLOOD TRANSFUSION? Blood Transfusion Information  A transfusion is the replacement of blood or some of its parts. Blood is made up of multiple cells which provide different functions.  Red blood cells carry oxygen and are used for blood loss replacement.  White blood cells fight against infection.  Platelets control bleeding.  Plasma helps clot blood.  Other blood products are available for specialized needs, such as hemophilia or other clotting disorders. BEFORE THE TRANSFUSION  Who gives blood for transfusions?   Healthy  volunteers who are fully evaluated to make sure their blood is safe. This is blood bank blood. Transfusion therapy is the safest it has ever been in the practice of medicine. Before blood is taken from a donor, a complete history is taken to make sure that person has no history of diseases nor engages in risky social behavior (examples are intravenous drug use or sexual activity with multiple partners). The donor's travel history is screened to minimize risk of transmitting infections, such as malaria. The donated blood is tested for signs of infectious diseases, such as HIV and hepatitis. The blood is then tested to be sure it is compatible with you in order to minimize the chance of a transfusion reaction. If you or a relative donates blood, this is often done in anticipation of surgery and is not appropriate for emergency situations. It takes many days to process the donated blood. RISKS AND COMPLICATIONS Although transfusion therapy is very safe and saves many lives, the main dangers of transfusion include:   Getting an infectious disease.  Developing a transfusion reaction. This is an allergic reaction to something in the blood you were given. Every precaution is taken to prevent this. The decision to have a blood transfusion has been considered carefully by your caregiver before blood is given. Blood is not given unless the benefits outweigh the risks. AFTER THE TRANSFUSION  Right after receiving a blood transfusion, you will usually feel much better and more energetic. This is especially true if your red blood cells have gotten low (anemic). The transfusion raises the level of the red blood cells which carry oxygen, and this usually causes an energy increase.  The nurse administering the transfusion will monitor you carefully for complications. HOME CARE INSTRUCTIONS  No special instructions are needed after a transfusion. You may find your energy is better. Speak with your caregiver about any  limitations on activity for underlying diseases you may have. SEEK MEDICAL CARE IF:   Your condition is not improving after your transfusion.  You develop redness or irritation at the intravenous (IV) site. SEEK IMMEDIATE MEDICAL CARE IF:  Any of the following symptoms occur over the next 12 hours:  Shaking chills.  You have a temperature by mouth above 102 F (38.9 C), not controlled by medicine.  Chest, back, or muscle pain.  People around you feel you are not acting correctly or are confused.  Shortness of breath or difficulty breathing.  Dizziness and fainting.  You get a rash or develop hives.  You have a decrease in urine output.  Your urine turns a dark color or changes to pink, red, or brown. Any of the following symptoms occur over the next 10 days:  You have a temperature by mouth above 102 F (38.9 C), not controlled by medicine.  Shortness of breath.  Weakness after normal activity.  The white part of the eye turns yellow (jaundice).  You have a decrease in the amount of urine or are urinating less often.  Your urine turns a dark color or changes to pink, red, or brown. Document Released: 03/15/2000 Document Revised: 06/10/2011 Document Reviewed: 11/02/2007 [facility] Patient Information 2014 [place], [place].  _______________________________________________________________________

## 2017-09-24 NOTE — Progress Notes (Signed)
LOV DR. Maudie Mercury SHAW 08-19-17 ON [facility]

## 2017-09-25 ENCOUNTER — Encounter (HOSPITAL_COMMUNITY): Payer: Self-pay

## 2017-09-25 ENCOUNTER — Other Ambulatory Visit: Payer: Self-pay

## 2017-09-25 ENCOUNTER — Encounter (HOSPITAL_COMMUNITY)
Admission: RE | Admit: 2017-09-25 | Discharge: 2017-09-25 | Disposition: A | Discharge status: Home / Self Care | Payer: BLUE CROSS/BLUE SHIELD | Source: Ambulatory Visit | Attending: Gynecologic Oncology | Admitting: Gynecologic Oncology

## 2017-09-25 DIAGNOSIS — I10 Essential (primary) hypertension: Secondary | ICD-10-CM | POA: Insufficient documentation

## 2017-09-25 DIAGNOSIS — E119 Type 2 diabetes mellitus without complications: Secondary | ICD-10-CM | POA: Diagnosis not present

## 2017-09-25 DIAGNOSIS — Z01810 Encounter for preprocedural cardiovascular examination: Secondary | ICD-10-CM | POA: Diagnosis not present

## 2017-09-25 DIAGNOSIS — Z01812 Encounter for preprocedural laboratory examination: Secondary | ICD-10-CM | POA: Diagnosis not present

## 2017-09-25 HISTORY — DX: Chronic kidney disease, stage 3 unspecified: N18.30

## 2017-09-25 HISTORY — DX: Gout, unspecified: M10.9

## 2017-09-25 HISTORY — DX: Other abnormal findings in urine: R82.998

## 2017-09-25 HISTORY — DX: Low back pain: M54.5

## 2017-09-25 HISTORY — DX: Unspecified osteoarthritis, unspecified site: M19.90

## 2017-09-25 HISTORY — DX: Cellulitis, unspecified: L03.90

## 2017-09-25 HISTORY — DX: Sleep apnea, unspecified: G47.30

## 2017-09-25 HISTORY — DX: Low back pain, unspecified: M54.50

## 2017-09-25 HISTORY — DX: Personal history of urinary calculi: Z87.442

## 2017-09-25 HISTORY — DX: Chronic kidney disease, stage 3 (moderate): N18.3

## 2017-09-25 LAB — COMPREHENSIVE METABOLIC PANEL
ALBUMIN: 3.7 g/dL (ref 3.5–5.0)
ALT: 17 U/L (ref 0–44)
ANION GAP: 6 (ref 5–15)
AST: 29 U/L (ref 15–41)
Alkaline Phosphatase: 43 U/L (ref 38–126)
BUN: 30 mg/dL — AB (ref 8–23)
CO2: 28 mmol/L (ref 22–32)
Calcium: 8.9 mg/dL (ref 8.9–10.3)
Chloride: 112 mmol/L — ABNORMAL HIGH (ref 98–111)
Creatinine, Ser: 1.25 mg/dL — ABNORMAL HIGH (ref 0.44–1.00)
GFR calc Af Amer: 49 mL/min — ABNORMAL LOW (ref >60)
GFR calc non Af Amer: 42 mL/min — ABNORMAL LOW (ref >60)
GLUCOSE: 268 mg/dL — AB (ref 70–99)
POTASSIUM: 4.9 mmol/L (ref 3.5–5.1)
SODIUM: 146 mmol/L — AB (ref 135–145)
TOTAL PROTEIN: 7.1 g/dL (ref 6.5–8.1)
Total Bilirubin: 0.5 mg/dL (ref 0.3–1.2)

## 2017-09-25 LAB — GLUCOSE, CAPILLARY: Glucose-Capillary: 244 mg/dL — ABNORMAL HIGH (ref 70–99)

## 2017-09-25 LAB — CBC
HCT: 37.8 % (ref 36.0–46.0)
Hemoglobin: 12.1 g/dL (ref 12.0–15.0)
MCH: 28.5 pg (ref 26.0–34.0)
MCHC: 32 g/dL (ref 30.0–36.0)
MCV: 89.2 fL (ref 78.0–100.0)
Platelets: 220 10*3/uL (ref 150–400)
RBC: 4.24 MIL/uL (ref 3.87–5.11)
RDW: 14.2 % (ref 11.5–15.5)
WBC: 6 10*3/uL (ref 4.0–10.5)

## 2017-09-25 LAB — ABO/RH: ABO/RH(D): A POS

## 2017-09-25 LAB — HEMOGLOBIN A1C
HEMOGLOBIN A1C: 8.2 % — AB (ref 4.8–5.6)
MEAN PLASMA GLUCOSE: 188.64 mg/dL

## 2017-09-25 NOTE — Progress Notes (Signed)
Patient pre-op EKG today abnormal. RN took today's EKG and prior EKG to DR Hatchett anesthesia for consult as well as prior ECHO study . Per Dr Jillyn Hidden, apparent changes noted in EKG when compared with prior EKG; recommends patient been seen by cardiology before proceeding with surgery , suggests a Stress Test would be satisfactory.   RN called and spoke with surgeon DR [name] to make aware. Dr [name] request RN contact patient to notify that they will reach out to her if they can secure a cardiology appt and if not , surgery may be rescheduled.   RN LVMM on patient mobile phone to notify that Dr [name] office will be in touch to with more information about  Any cardiology appts or if surgery will be rescheduled and advised patient to call Dr [name] office with any further questions .

## 2017-09-25 NOTE — Progress Notes (Addendum)
CHART LEFT WITH VERNA, RN TO F/U AND CHECK LABS.

## 2017-09-26 ENCOUNTER — Ambulatory Visit: Payer: Self-pay | Admitting: Gynecologic Oncology

## 2017-09-26 ENCOUNTER — Other Ambulatory Visit: Payer: Self-pay | Admitting: Oncology

## 2017-09-26 ENCOUNTER — Ambulatory Visit (HOSPITAL_COMMUNITY)
Admission: RE | Admit: 2017-09-26 | Discharge: 2017-09-26 | Disposition: A | Discharge status: Home / Self Care | Payer: BLUE CROSS/BLUE SHIELD | Source: Ambulatory Visit | Attending: Cardiovascular Disease | Admitting: Cardiovascular Disease

## 2017-09-26 ENCOUNTER — Telehealth: Payer: Self-pay | Admitting: Oncology

## 2017-09-26 DIAGNOSIS — R9431 Abnormal electrocardiogram [ECG] [EKG]: Secondary | ICD-10-CM | POA: Insufficient documentation

## 2017-09-26 DIAGNOSIS — I4510 Unspecified right bundle-branch block: Secondary | ICD-10-CM | POA: Diagnosis not present

## 2017-09-26 DIAGNOSIS — N9489 Other specified conditions associated with female genital organs and menstrual cycle: Secondary | ICD-10-CM

## 2017-09-26 LAB — EXERCISE TOLERANCE TEST
CHL RATE OF PERCEIVED EXERTION: 19
CSEPED: 2 min
CSEPEW: 4.6 METS
CSEPPHR: 141 {beats}/min
Exercise duration (sec): 8 s
MPHR: 149 {beats}/min
Percent HR: 94 %
Rest HR: 88 {beats}/min

## 2017-09-26 NOTE — Progress Notes (Signed)
09-25-17 CMP result routed to Dr. Harrington Challenger for review.

## 2017-09-26 NOTE — Telephone Encounter (Signed)
Called patient and asked if she has a cardiologist.  She said she did have one, Dr. Chase Picket but he has retired.  Advised her that we have placed a referral to cardiology for a stress test and that we will be calling her back for an appointment.    Referral placed to [place].  Called the [facility] location and left a message for the scheduler.

## 2017-09-26 NOTE — Telephone Encounter (Signed)
Appointment scheduled for [facility] for exercise stress test at 3:30.  Notified Patient of time and location.

## 2017-09-29 ENCOUNTER — Telehealth: Payer: Self-pay | Admitting: Oncology

## 2017-09-29 NOTE — Telephone Encounter (Signed)
Called Elizabeth Crosby and advised her that she has been cleared for surgery tomorrow and that her stress test was negative.  She verbalized understanding and agreement.

## 2017-09-29 NOTE — Progress Notes (Signed)
Stress Test Result brought to PAT unit by Joylene John with note per Dr [name] requesting that case be reviewed by anesthesia to "see if ok to do surgery.  RN took Stress test results to anesthesia for review. Results reviewed by Dr Candida Peeling. Per Dr Sabra Heck patient ok to proceed. Rn called and spoke with NP Joylene John to make aware of anesthesia's conclusion

## 2017-09-29 NOTE — Progress Notes (Signed)
F/u to this RN progress note on 09-25-17.  Patient underwent  Treadmill Stress Test on 09-26-17. See result in epic under cv procedures.

## 2017-09-30 ENCOUNTER — Encounter (HOSPITAL_COMMUNITY): Payer: Self-pay | Admitting: Gynecologic Oncology

## 2017-09-30 ENCOUNTER — Other Ambulatory Visit: Payer: Self-pay

## 2017-09-30 ENCOUNTER — Ambulatory Visit (HOSPITAL_COMMUNITY): Payer: BLUE CROSS/BLUE SHIELD | Admitting: Certified Registered Nurse Anesthetist

## 2017-09-30 ENCOUNTER — Ambulatory Visit (HOSPITAL_COMMUNITY)
Admission: RE | Admit: 2017-09-30 | Discharge: 2017-10-01 | Disposition: A | Discharge status: Home / Self Care | Payer: BLUE CROSS/BLUE SHIELD | Source: Ambulatory Visit | Attending: Gynecologic Oncology | Admitting: Gynecologic Oncology

## 2017-09-30 ENCOUNTER — Encounter (HOSPITAL_COMMUNITY)
Admission: RE | Disposition: A | Discharge status: Home / Self Care | Payer: Self-pay | Source: Ambulatory Visit | Attending: Gynecologic Oncology

## 2017-09-30 DIAGNOSIS — Z794 Long term (current) use of insulin: Secondary | ICD-10-CM | POA: Diagnosis not present

## 2017-09-30 DIAGNOSIS — Z888 Allergy status to other drugs, medicaments and biological substances status: Secondary | ICD-10-CM | POA: Insufficient documentation

## 2017-09-30 DIAGNOSIS — C541 Malignant neoplasm of endometrium: Secondary | ICD-10-CM | POA: Diagnosis not present

## 2017-09-30 DIAGNOSIS — E785 Hyperlipidemia, unspecified: Secondary | ICD-10-CM | POA: Insufficient documentation

## 2017-09-30 DIAGNOSIS — N736 Female pelvic peritoneal adhesions (postinfective): Secondary | ICD-10-CM | POA: Insufficient documentation

## 2017-09-30 DIAGNOSIS — F419 Anxiety disorder, unspecified: Secondary | ICD-10-CM | POA: Insufficient documentation

## 2017-09-30 DIAGNOSIS — R971 Elevated cancer antigen 125 [CA 125]: Secondary | ICD-10-CM | POA: Insufficient documentation

## 2017-09-30 DIAGNOSIS — Z800 Family history of malignant neoplasm of digestive organs: Secondary | ICD-10-CM | POA: Insufficient documentation

## 2017-09-30 DIAGNOSIS — Z882 Allergy status to sulfonamides status: Secondary | ICD-10-CM | POA: Insufficient documentation

## 2017-09-30 DIAGNOSIS — Z803 Family history of malignant neoplasm of breast: Secondary | ICD-10-CM | POA: Insufficient documentation

## 2017-09-30 DIAGNOSIS — N950 Postmenopausal bleeding: Secondary | ICD-10-CM

## 2017-09-30 DIAGNOSIS — G4730 Sleep apnea, unspecified: Secondary | ICD-10-CM | POA: Insufficient documentation

## 2017-09-30 DIAGNOSIS — D271 Benign neoplasm of left ovary: Secondary | ICD-10-CM | POA: Insufficient documentation

## 2017-09-30 DIAGNOSIS — Z7982 Long term (current) use of aspirin: Secondary | ICD-10-CM | POA: Diagnosis not present

## 2017-09-30 DIAGNOSIS — R9389 Abnormal findings on diagnostic imaging of other specified body structures: Secondary | ICD-10-CM | POA: Diagnosis not present

## 2017-09-30 DIAGNOSIS — Z79899 Other long term (current) drug therapy: Secondary | ICD-10-CM | POA: Insufficient documentation

## 2017-09-30 DIAGNOSIS — Z6841 Body Mass Index (BMI) 40.0 and over, adult: Secondary | ICD-10-CM | POA: Diagnosis not present

## 2017-09-30 DIAGNOSIS — K219 Gastro-esophageal reflux disease without esophagitis: Secondary | ICD-10-CM | POA: Insufficient documentation

## 2017-09-30 DIAGNOSIS — I129 Hypertensive chronic kidney disease with stage 1 through stage 4 chronic kidney disease, or unspecified chronic kidney disease: Secondary | ICD-10-CM | POA: Insufficient documentation

## 2017-09-30 DIAGNOSIS — E1121 Type 2 diabetes mellitus with diabetic nephropathy: Secondary | ICD-10-CM | POA: Insufficient documentation

## 2017-09-30 DIAGNOSIS — R1900 Intra-abdominal and pelvic swelling, mass and lump, unspecified site: Secondary | ICD-10-CM | POA: Diagnosis present

## 2017-09-30 DIAGNOSIS — N83201 Unspecified ovarian cyst, right side: Secondary | ICD-10-CM

## 2017-09-30 DIAGNOSIS — N183 Chronic kidney disease, stage 3 (moderate): Secondary | ICD-10-CM | POA: Diagnosis not present

## 2017-09-30 DIAGNOSIS — D270 Benign neoplasm of right ovary: Secondary | ICD-10-CM | POA: Diagnosis not present

## 2017-09-30 DIAGNOSIS — I10 Essential (primary) hypertension: Secondary | ICD-10-CM | POA: Diagnosis not present

## 2017-09-30 HISTORY — PX: OMENTECTOMY: SHX5985

## 2017-09-30 HISTORY — DX: Unspecified glaucoma: H40.9

## 2017-09-30 HISTORY — PX: ROBOTIC ASSISTED TOTAL HYSTERECTOMY WITH BILATERAL SALPINGO OOPHERECTOMY: SHX6086

## 2017-09-30 LAB — GLUCOSE, CAPILLARY
GLUCOSE-CAPILLARY: 95 mg/dL (ref 70–99)
GLUCOSE-CAPILLARY: 160 mg/dL — AB (ref 70–99)
GLUCOSE-CAPILLARY: 184 mg/dL — AB (ref 70–99)
GLUCOSE-CAPILLARY: 225 mg/dL — AB (ref 70–99)
Glucose-Capillary: 149 mg/dL — ABNORMAL HIGH (ref 70–99)

## 2017-09-30 LAB — TYPE AND SCREEN
ABO/RH(D): A POS
Antibody Screen: NEGATIVE

## 2017-09-30 SURGERY — ROBOTIC ASSISTED TOTAL HYSTERECTOMY WITH BILATERAL SALPINGO OOPHORECTOMY
Anesthesia: General | Laterality: Bilateral

## 2017-09-30 MED ORDER — ENOXAPARIN SODIUM 40 MG/0.4ML ~~LOC~~ SOLN
40.0000 mg | SUBCUTANEOUS | Status: DC
  Administered 2017-10-01: 40 mg via SUBCUTANEOUS
  Filled 2017-09-30: qty 0.4

## 2017-09-30 MED ORDER — FENTANYL CITRATE (PF) 250 MCG/5ML IJ SOLN
INTRAMUSCULAR | Status: AC
  Filled 2017-09-30: qty 5

## 2017-09-30 MED ORDER — ONDANSETRON HCL 4 MG/2ML IJ SOLN
INTRAMUSCULAR | Status: AC
  Filled 2017-09-30: qty 2

## 2017-09-30 MED ORDER — KETOROLAC TROMETHAMINE 15 MG/ML IJ SOLN
INTRAMUSCULAR | Status: AC
  Administered 2017-09-30: 15 mg
  Filled 2017-09-30: qty 1

## 2017-09-30 MED ORDER — EPHEDRINE SULFATE 50 MG/ML IJ SOLN
INTRAMUSCULAR | Status: DC | PRN
  Administered 2017-09-30: 10 mg via INTRAVENOUS

## 2017-09-30 MED ORDER — PROPOFOL 10 MG/ML IV BOLUS
INTRAVENOUS | Status: DC | PRN
  Administered 2017-09-30: 140 mg via INTRAVENOUS

## 2017-09-30 MED ORDER — DIPHENHYDRAMINE HCL 50 MG/ML IJ SOLN
INTRAMUSCULAR | Status: DC | PRN
  Administered 2017-09-30: 12.5 mg via INTRAVENOUS

## 2017-09-30 MED ORDER — KETOROLAC TROMETHAMINE 30 MG/ML IJ SOLN
15.0000 mg | Freq: Once | INTRAMUSCULAR | Status: AC
  Administered 2017-09-30: 15 mg via INTRAVENOUS

## 2017-09-30 MED ORDER — ROCURONIUM BROMIDE 50 MG/5ML IV SOSY
PREFILLED_SYRINGE | INTRAVENOUS | Status: DC | PRN
  Administered 2017-09-30: 100 mg via INTRAVENOUS

## 2017-09-30 MED ORDER — PHENYLEPHRINE 40 MCG/ML (10ML) SYRINGE FOR IV PUSH (FOR BLOOD PRESSURE SUPPORT)
PREFILLED_SYRINGE | INTRAVENOUS | Status: AC
  Filled 2017-09-30: qty 20

## 2017-09-30 MED ORDER — SUGAMMADEX SODIUM 500 MG/5ML IV SOLN
INTRAVENOUS | Status: AC
  Filled 2017-09-30: qty 5

## 2017-09-30 MED ORDER — CEFAZOLIN SODIUM-DEXTROSE 2-4 GM/100ML-% IV SOLN
2.0000 g | INTRAVENOUS | Status: AC
  Administered 2017-09-30: 2 g via INTRAVENOUS
  Filled 2017-09-30: qty 100

## 2017-09-30 MED ORDER — SUGAMMADEX SODIUM 500 MG/5ML IV SOLN
INTRAVENOUS | Status: DC | PRN
  Administered 2017-09-30: 400 mg via INTRAVENOUS

## 2017-09-30 MED ORDER — PHENYLEPHRINE HCL 10 MG/ML IJ SOLN
INTRAMUSCULAR | Status: DC | PRN
  Administered 2017-09-30 (×2): 120 ug via INTRAVENOUS
  Administered 2017-09-30 (×2): 80 ug via INTRAVENOUS

## 2017-09-30 MED ORDER — ASPIRIN EC 81 MG PO TBEC
81.0000 mg | DELAYED_RELEASE_TABLET | Freq: Every morning | ORAL | Status: DC
  Administered 2017-10-01: 81 mg via ORAL
  Filled 2017-09-30: qty 1

## 2017-09-30 MED ORDER — STERILE WATER FOR IRRIGATION IR SOLN
Status: DC | PRN
  Administered 2017-09-30: 1000 mL

## 2017-09-30 MED ORDER — IBUPROFEN 400 MG PO TABS
600.0000 mg | ORAL_TABLET | Freq: Four times a day (QID) | ORAL | Status: DC
  Administered 2017-10-01: 600 mg via ORAL
  Filled 2017-09-30: qty 1

## 2017-09-30 MED ORDER — SCOPOLAMINE 1 MG/3DAYS TD PT72
1.0000 | MEDICATED_PATCH | TRANSDERMAL | Status: DC
  Filled 2017-09-30: qty 1

## 2017-09-30 MED ORDER — HYDROMORPHONE HCL 1 MG/ML IJ SOLN
0.2000 mg | INTRAMUSCULAR | Status: DC | PRN
  Administered 2017-09-30: 0.6 mg via INTRAVENOUS

## 2017-09-30 MED ORDER — ONDANSETRON HCL 4 MG/2ML IJ SOLN: 4.0000 mg | Freq: Four times a day (QID) | INTRAMUSCULAR | Status: DC | PRN

## 2017-09-30 MED ORDER — INSULIN ASPART 100 UNIT/ML ~~LOC~~ SOLN
0.0000 [IU] | Freq: Three times a day (TID) | SUBCUTANEOUS | Status: DC
  Administered 2017-09-30: 4 [IU] via SUBCUTANEOUS
  Administered 2017-10-01 (×2): 7 [IU] via SUBCUTANEOUS

## 2017-09-30 MED ORDER — OXYCODONE HCL 5 MG PO TABS
ORAL_TABLET | ORAL | Status: AC
  Filled 2017-09-30: qty 1

## 2017-09-30 MED ORDER — DEXAMETHASONE SODIUM PHOSPHATE 10 MG/ML IJ SOLN
INTRAMUSCULAR | Status: AC
  Filled 2017-09-30: qty 1

## 2017-09-30 MED ORDER — LIDOCAINE 2% (20 MG/ML) 5 ML SYRINGE
INTRAMUSCULAR | Status: AC
  Filled 2017-09-30: qty 5

## 2017-09-30 MED ORDER — ACETAMINOPHEN 500 MG PO TABS
1000.0000 mg | ORAL_TABLET | Freq: Two times a day (BID) | ORAL | Status: DC
  Administered 2017-09-30 – 2017-10-01 (×2): 1000 mg via ORAL
  Filled 2017-09-30 (×2): qty 2

## 2017-09-30 MED ORDER — OXYCODONE HCL 5 MG PO TABS
5.0000 mg | ORAL_TABLET | ORAL | Status: DC | PRN
  Administered 2017-09-30 – 2017-10-01 (×2): 5 mg via ORAL
  Filled 2017-09-30: qty 1

## 2017-09-30 MED ORDER — HYDROMORPHONE HCL 1 MG/ML IJ SOLN
INTRAMUSCULAR | Status: AC
  Administered 2017-09-30: 0.6 mg via INTRAVENOUS
  Filled 2017-09-30: qty 1

## 2017-09-30 MED ORDER — SCOPOLAMINE 1 MG/3DAYS TD PT72: 1.0000 | MEDICATED_PATCH | TRANSDERMAL | Status: DC

## 2017-09-30 MED ORDER — GABAPENTIN 300 MG PO CAPS
300.0000 mg | ORAL_CAPSULE | ORAL | Status: AC
  Administered 2017-09-30: 300 mg via ORAL
  Filled 2017-09-30: qty 1

## 2017-09-30 MED ORDER — INSULIN GLARGINE 100 UNIT/ML ~~LOC~~ SOLN
30.0000 [IU] | Freq: Every day | SUBCUTANEOUS | Status: DC
  Administered 2017-09-30: 30 [IU] via SUBCUTANEOUS
  Filled 2017-09-30 (×2): qty 0.3

## 2017-09-30 MED ORDER — LACTATED RINGERS IR SOLN
Status: DC | PRN
  Administered 2017-09-30: 1000 mL

## 2017-09-30 MED ORDER — LOSARTAN POTASSIUM-HCTZ 100-25 MG PO TABS: 1.0000 | ORAL_TABLET | Freq: Every day | ORAL | Status: DC

## 2017-09-30 MED ORDER — FENTANYL CITRATE (PF) 100 MCG/2ML IJ SOLN
INTRAMUSCULAR | Status: AC
  Filled 2017-09-30: qty 2

## 2017-09-30 MED ORDER — PANTOPRAZOLE SODIUM 40 MG PO TBEC
40.0000 mg | DELAYED_RELEASE_TABLET | Freq: Every day | ORAL | Status: DC
  Administered 2017-10-01: 40 mg via ORAL
  Filled 2017-09-30: qty 1

## 2017-09-30 MED ORDER — FENTANYL CITRATE (PF) 100 MCG/2ML IJ SOLN
25.0000 ug | INTRAMUSCULAR | Status: DC | PRN
  Administered 2017-09-30: 50 ug via INTRAVENOUS
  Administered 2017-09-30 (×2): 25 ug via INTRAVENOUS

## 2017-09-30 MED ORDER — ACETAMINOPHEN 500 MG PO TABS
1000.0000 mg | ORAL_TABLET | ORAL | Status: AC
  Administered 2017-09-30: 1000 mg via ORAL
  Filled 2017-09-30: qty 2

## 2017-09-30 MED ORDER — DEXTROSE 5 % IV SOLN
INTRAVENOUS | Status: DC | PRN
  Administered 2017-09-30: 50 ug/min via INTRAVENOUS

## 2017-09-30 MED ORDER — SENNOSIDES-DOCUSATE SODIUM 8.6-50 MG PO TABS
2.0000 | ORAL_TABLET | Freq: Every day | ORAL | Status: DC
  Administered 2017-09-30: 2 via ORAL
  Filled 2017-09-30: qty 2

## 2017-09-30 MED ORDER — LACTATED RINGERS IV SOLN
INTRAVENOUS | Status: DC
  Administered 2017-09-30: 08:00:00 via INTRAVENOUS

## 2017-09-30 MED ORDER — TRAMADOL HCL 50 MG PO TABS: 100.0000 mg | ORAL_TABLET | Freq: Two times a day (BID) | ORAL | Status: DC | PRN

## 2017-09-30 MED ORDER — KCL IN DEXTROSE-NACL 20-5-0.45 MEQ/L-%-% IV SOLN
INTRAVENOUS | Status: DC
  Administered 2017-09-30: 18:00:00 via INTRAVENOUS
  Filled 2017-09-30: qty 1000

## 2017-09-30 MED ORDER — LIDOCAINE 2% (20 MG/ML) 5 ML SYRINGE
INTRAMUSCULAR | Status: DC | PRN
  Administered 2017-09-30: 100 mg via INTRAVENOUS

## 2017-09-30 MED ORDER — ONDANSETRON HCL 4 MG PO TABS: 4.0000 mg | ORAL_TABLET | Freq: Four times a day (QID) | ORAL | Status: DC | PRN

## 2017-09-30 MED ORDER — ONDANSETRON HCL 4 MG/2ML IJ SOLN
INTRAMUSCULAR | Status: DC | PRN
  Administered 2017-09-30: 4 mg via INTRAVENOUS

## 2017-09-30 MED ORDER — DEXAMETHASONE SODIUM PHOSPHATE 10 MG/ML IJ SOLN
INTRAMUSCULAR | Status: DC | PRN
  Administered 2017-09-30: 10 mg via INTRAVENOUS

## 2017-09-30 MED ORDER — DEXAMETHASONE SODIUM PHOSPHATE 4 MG/ML IJ SOLN: 4.0000 mg | INTRAMUSCULAR | Status: DC

## 2017-09-30 MED ORDER — INSULIN ASPART 100 UNIT/ML ~~LOC~~ SOLN
10.0000 [IU] | Freq: Three times a day (TID) | SUBCUTANEOUS | Status: DC
  Administered 2017-09-30 – 2017-10-01 (×3): 10 [IU] via SUBCUTANEOUS

## 2017-09-30 MED ORDER — HYDROCHLOROTHIAZIDE 25 MG PO TABS
25.0000 mg | ORAL_TABLET | Freq: Every day | ORAL | Status: DC
  Administered 2017-09-30: 25 mg via ORAL
  Filled 2017-09-30 (×2): qty 1

## 2017-09-30 MED ORDER — FENTANYL CITRATE (PF) 100 MCG/2ML IJ SOLN
INTRAMUSCULAR | Status: DC | PRN
  Administered 2017-09-30: 50 ug via INTRAVENOUS

## 2017-09-30 MED ORDER — PROMETHAZINE HCL 25 MG/ML IJ SOLN: 6.2500 mg | INTRAMUSCULAR | Status: DC | PRN

## 2017-09-30 MED ORDER — AMLODIPINE BESYLATE 5 MG PO TABS
5.0000 mg | ORAL_TABLET | Freq: Every morning | ORAL | Status: DC
  Filled 2017-09-30: qty 1

## 2017-09-30 MED ORDER — PROPOFOL 10 MG/ML IV BOLUS
INTRAVENOUS | Status: AC
  Filled 2017-09-30: qty 20

## 2017-09-30 MED ORDER — LOSARTAN POTASSIUM 50 MG PO TABS
100.0000 mg | ORAL_TABLET | Freq: Every day | ORAL | Status: DC
  Administered 2017-09-30: 100 mg via ORAL
  Filled 2017-09-30 (×2): qty 2

## 2017-09-30 MED ORDER — ENOXAPARIN SODIUM 40 MG/0.4ML ~~LOC~~ SOLN
40.0000 mg | SUBCUTANEOUS | Status: AC
  Administered 2017-09-30: 40 mg via SUBCUTANEOUS
  Filled 2017-09-30: qty 0.4

## 2017-09-30 SURGICAL SUPPLY — 47 items
APPLICATOR SURGIFLO ENDO (HEMOSTASIS) IMPLANT
BAG LAPAROSCOPIC 12 15 PORT 16 (BASKET) IMPLANT
BAG RETRIEVAL 12/15 (BASKET)
COVER BACK TABLE 60X90IN (DRAPES) ×3 IMPLANT
COVER TIP SHEARS 8 DVNC (MISCELLANEOUS) ×2 IMPLANT
COVER TIP SHEARS 8MM DA VINCI (MISCELLANEOUS) ×1
DERMABOND ADVANCED (GAUZE/BANDAGES/DRESSINGS) ×1
DERMABOND ADVANCED .7 DNX12 (GAUZE/BANDAGES/DRESSINGS) ×2 IMPLANT
DRAPE ARM DVNC X/XI (DISPOSABLE) ×8 IMPLANT
DRAPE COLUMN DVNC XI (DISPOSABLE) ×2 IMPLANT
DRAPE DA VINCI XI ARM (DISPOSABLE) ×4
DRAPE DA VINCI XI COLUMN (DISPOSABLE) ×1
DRAPE SHEET LG 3/4 BI-LAMINATE (DRAPES) ×3 IMPLANT
DRAPE SURG IRRIG POUCH 19X23 (DRAPES) ×3 IMPLANT
DRSG TEGADERM 8X12 (GAUZE/BANDAGES/DRESSINGS) ×3 IMPLANT
ELECT REM PT RETURN 15FT ADLT (MISCELLANEOUS) ×3 IMPLANT
GLOVE BIO SURGEON STRL SZ 6 (GLOVE) ×12 IMPLANT
GLOVE BIO SURGEON STRL SZ 6.5 (GLOVE) ×6 IMPLANT
GOWN STRL REUS W/ TWL LRG LVL3 (GOWN DISPOSABLE) ×6 IMPLANT
GOWN STRL REUS W/TWL LRG LVL3 (GOWN DISPOSABLE) ×3
HOLDER FOLEY CATH W/STRAP (MISCELLANEOUS) ×3 IMPLANT
IRRIG SUCT STRYKERFLOW 2 WTIP (MISCELLANEOUS) ×3
IRRIGATION SUCT STRKRFLW 2 WTP (MISCELLANEOUS) ×2 IMPLANT
KIT PROCEDURE DA VINCI SI (MISCELLANEOUS)
KIT PROCEDURE DVNC SI (MISCELLANEOUS) IMPLANT
MANIPULATOR UTERINE 4.5 ZUMI (MISCELLANEOUS) ×3 IMPLANT
NEEDLE SPNL 18GX3.5 QUINCKE PK (NEEDLE) IMPLANT
OBTURATOR OPTICAL STANDARD 8MM (TROCAR) ×1
OBTURATOR OPTICAL STND 8 DVNC (TROCAR) ×2
OBTURATOR OPTICALSTD 8 DVNC (TROCAR) ×2 IMPLANT
PACK ROBOT GYN CUSTOM WL (TRAY / TRAY PROCEDURE) ×3 IMPLANT
PAD POSITIONING PINK XL (MISCELLANEOUS) ×3 IMPLANT
PORT ACCESS TROCAR AIRSEAL 12 (TROCAR) ×2 IMPLANT
PORT ACCESS TROCAR AIRSEAL 5M (TROCAR) ×1
POUCH SPECIMEN RETRIEVAL 10MM (ENDOMECHANICALS) ×3 IMPLANT
SEAL CANN UNIV 5-8 DVNC XI (MISCELLANEOUS) ×8 IMPLANT
SEAL XI 5MM-8MM UNIVERSAL (MISCELLANEOUS) ×4
SET TRI-LUMEN FLTR TB AIRSEAL (TUBING) ×3 IMPLANT
SURGIFLO W/THROMBIN 8M KIT (HEMOSTASIS) IMPLANT
SUT VIC AB 0 CT1 27 (SUTURE)
SUT VIC AB 0 CT1 27XBRD ANTBC (SUTURE) IMPLANT
SYR 10ML LL (SYRINGE) IMPLANT
TOWEL OR NON WOVEN STRL DISP B (DISPOSABLE) ×3 IMPLANT
TRAP SPECIMEN MUCOUS 40CC (MISCELLANEOUS) ×3 IMPLANT
TRAY FOLEY MTR SLVR 16FR STAT (SET/KITS/TRAYS/PACK) ×3 IMPLANT
UNDERPAD 30X30 (UNDERPADS AND DIAPERS) ×3 IMPLANT
WATER STERILE IRR 1000ML POUR (IV SOLUTION) IMPLANT

## 2017-09-30 NOTE — Anesthesia Procedure Notes (Signed)
Procedure Name: Intubation Date/Time: 09/30/2017 9:47 AM Performed by: West Pugh, CRNA Pre-anesthesia Checklist: Patient identified, Emergency Drugs available, Suction available, Patient being monitored and Timeout performed Patient Re-evaluated:Patient Re-evaluated prior to induction Oxygen Delivery Method: Circle system utilized Preoxygenation: Pre-oxygenation with 100% oxygen Induction Type: IV induction Ventilation: Mask ventilation without difficulty Laryngoscope Size: Mac and 3 Grade View: Grade II Tube type: Oral Tube size: 7.0 mm Number of attempts: 1 Airway Equipment and Method: Stylet Placement Confirmation: ETT inserted through vocal cords under direct vision,  positive ETCO2,  CO2 detector and breath sounds checked- equal and bilateral Secured at: 22 cm Tube secured with: Tape Dental Injury: Teeth and Oropharynx as per pre-operative assessment  Comments: EMS student intubated. AOI. Mask ventilation throughout procedure

## 2017-09-30 NOTE — Transfer of Care (Signed)
Immediate Anesthesia Transfer of Care Note  Patient: Elizabeth Crosby  Procedure(s) Performed: ROBOTIC ASSISTED TOTAL HYSTERECTOMY WITH BILATERAL SALPINGO OOPHORECTOMY (Bilateral ) OMENTECTOMY  Patient Location: PACU  Anesthesia Type:General  Level of Consciousness: awake, alert , oriented and patient cooperative  Airway & Oxygen Therapy: Patient Spontanous Breathing and Patient connected to face mask oxygen  Post-op Assessment: Report given to RN and Post -op Vital signs reviewed and stable  Post vital signs: Reviewed and stable  Last Vitals:  Vitals Value Taken Time  BP    Temp    Pulse 68 09/30/2017 12:11 PM  Resp 18 09/30/2017 12:11 PM  SpO2 100 % 09/30/2017 12:11 PM  Vitals shown include unvalidated device data.  Last Pain:  Vitals:   09/30/17 0732  TempSrc:   PainSc: 3       Patients Stated Pain Goal: 4 (50/56/97 9480)  Complications: No apparent anesthesia complications

## 2017-09-30 NOTE — Anesthesia Postprocedure Evaluation (Signed)
Anesthesia Post Note  Patient: Elizabeth Crosby  Procedure(s) Performed: ROBOTIC ASSISTED TOTAL HYSTERECTOMY WITH BILATERAL SALPINGO OOPHORECTOMY (Bilateral ) OMENTECTOMY     Patient location during evaluation: PACU Anesthesia Type: General Level of consciousness: sedated Pain management: pain level controlled Vital Signs Assessment: post-procedure vital signs reviewed and stable Respiratory status: spontaneous breathing and respiratory function stable Cardiovascular status: stable Postop Assessment: no apparent nausea or vomiting Anesthetic complications: no    Last Vitals:  Vitals:   09/30/17 1206 09/30/17 1300  BP: (!) 121/51 (!) 110/48  Pulse: 68 70  Resp: 15 11  Temp: 36.7 C   SpO2: 100% 99%    Last Pain:  Vitals:   09/30/17 1206  TempSrc:   PainSc: 5                  Dhamar Gregory DANIEL

## 2017-09-30 NOTE — Anesthesia Preprocedure Evaluation (Addendum)
Anesthesia Evaluation  Patient identified by MRN, date of birth, ID band Patient awake    Reviewed: Allergy & Precautions, NPO status , Patient's Chart, lab work & pertinent test results  History of Anesthesia Complications (+) PONV  Airway Mallampati: II  TM Distance: >3 FB Neck ROM: Full    Dental no notable dental hx. (+) Poor Dentition, Dental Advisory Given   Pulmonary sleep apnea ,    Pulmonary exam normal        Cardiovascular hypertension, Pt. on medications Normal cardiovascular exam     Neuro/Psych Anxiety negative neurological ROS     GI/Hepatic Neg liver ROS, GERD  ,  Endo/Other  diabetes, Insulin Dependent  Renal/GU Renal InsufficiencyRenal disease     Musculoskeletal   Abdominal   Peds  Hematology   Anesthesia Other Findings   Reproductive/Obstetrics                            Anesthesia Physical Anesthesia Plan  ASA: III  Anesthesia Plan: General   Post-op Pain Management:    Induction: Intravenous  PONV Risk Score and Plan: 4 or greater and Ondansetron, Dexamethasone, Diphenhydramine and Treatment may vary due to age or medical condition  Airway Management Planned: Oral ETT  Additional Equipment:   Intra-op Plan:   Post-operative Plan: Extubation in OR  Informed Consent: I have reviewed the patients History and Physical, chart, labs and discussed the procedure including the risks, benefits and alternatives for the proposed anesthesia with the patient or authorized representative who has indicated his/her understanding and acceptance.   Dental advisory given  Plan Discussed with: CRNA and Anesthesiologist  Anesthesia Plan Comments:       Anesthesia Quick Evaluation

## 2017-09-30 NOTE — Progress Notes (Signed)
Inpatient Diabetes Program Recommendations  AACE/ADA: New Consensus Statement on Inpatient Glycemic Control (2015)  Target Ranges:  Prepandial:   less than 140 mg/dL      Peak postprandial:   less than 180 mg/dL (1-2 hours)      Critically ill patients:  140 - 180 mg/dL   Results for Elizabeth Crosby, Elizabeth Crosby (MRN [id number]) as of 09/30/2017 12:47  Ref. Range 09/30/2017 06:59 09/30/2017 11:34 09/30/2017 12:09  Glucose-Capillary Latest Ref Range: 70 - 99 mg/dL 95 149 (H)  10 mg Decadron at 10am 160 (H)     Admit For Hysterectomy/Oopherectomy.   History: DM  Home DM Meds: Tresiba 90 units daily        Novolog 10 units Breakfast/ 10 units Lunch/ 20 units Dinner        Metformin 850 mg daily  Current Orders: Lantus 30 units QHS (Signed and Held)      Novolog Resistant Correction Scale/ SSI (0-20 units) TID AC (Signed and Held)      Novolog 10 units TID with meals (Signed and Held)       Note patient received 10 mg Decadron X 1 dose this AM.  Has signed and held orders for Lantus and Novolog to start once patient transferred to room.  Agree with current Insulin Orders.     --Will follow patient during hospitalization--  Wyn Quaker RN, MSN, CDE Diabetes Coordinator Inpatient Glycemic Control Team Team Pager: [phone] (8a-5p)

## 2017-09-30 NOTE — Interval H&P Note (Signed)
History and Physical Interval Note:  09/30/2017 7:01 AM  Elizabeth Crosby  has presented today for surgery, with the diagnosis of RIGHT OVARIAN CYST AND THICKENED ENDOMETRIUM  The various methods of treatment have been discussed with the patient and family. After consideration of risks, benefits and other options for treatment, the patient has consented to  Procedure(s): ROBOTIC ASSISTED TOTAL HYSTERECTOMY WITH BILATERAL SALPINGO OOPHORECTOMY WITH POSSIBLE STAGING (Bilateral) as a surgical intervention .  The patient's history has been reviewed, patient examined, no change in status, stable for surgery.  I have reviewed the patient's chart and labs.  Questions were answered to the patient's satisfaction.     Thereasa Solo

## 2017-09-30 NOTE — Op Note (Signed)
OPERATIVE NOTE 09/30/17  Surgeon: Donaciano Eva   Assistants: Dr [name] (an MD assistant was necessary for tissue manipulation, management of robotic instrumentation, retraction and positioning due to the complexity of the case and hospital policies).   Anesthesia: General endotracheal anesthesia  ASA Class: 3   Pre-operative Diagnosis: postmenopausal bleeding, elevated CA 125, right ovarian mass, extreme morbid obesity (BMI >60kg/m2)  Post-operative Diagnosis: grade 1 endometrial cancer, benign right ovarian cyst, pelvic adhesions.  Operation: Robotic-assisted laparoscopic total hysterectomy with bilateral salpingoophorectomy, omentectomy.    Surgeon: Donaciano Eva  Assistant Surgeon: [name] MD  Anesthesia: GET  Urine Output: 300cc  Operative Findings:  : extreme morbid obesity (BMI >60) which increased the duration of the procedure by 1 hour and increased the number of personnel necessary for patient positioning and visualization assistance.  Adhesions between omentum and uterus, tubes and ovaries. FIrm, slightly enlarged right ovary, grossly normal omentum (no palpable masses). Frozen section revealed deeply invasive grade 1 endometrial cancer. Unable to perform lymphadenectomy due to extreme adiposiy and retroperitoneal adiposity.   Estimated Blood Loss:  less than 100 mL      Total IV Fluids: 800 ml         Specimens: omentum, washings, uterus, cervix bilateral tubes and ovaries.          Complications:  None; patient tolerated the procedure well.         Disposition: PACU - hemodynamically stable.  Procedure Details  The patient was seen in the Holding Room. The risks, benefits, complications, treatment options, and expected outcomes were discussed with the patient.  The patient concurred with the proposed plan, giving informed consent.  The site of surgery properly noted/marked. The patient was identified as Elizabeth Crosby and the  procedure verified as a Robotic-assisted hysterectomy with bilateral salpingo oophorectomy. A Time Out was held and the above information confirmed.  After induction of anesthesia, the patient was draped and prepped in the usual sterile manner. Additional personnel was necessary for this step. The abdominal pannus needed to be taped down to prevent shifting during the procedure. The patient was placed in supine position after anesthesia and draped and prepped in the usual sterile manner. The abdominal drape was placed after the CholoraPrep had been allowed to dry for 3 minutes.  Her arms were tucked to her side with all appropriate precautions.  The shoulders were stabilized with padded shoulder blocks applied to the acromium processes.  The patient was placed in the semi-lithotomy position in [place].  The perineum was prepped with Betadine. The patient was then prepped. Foley catheter was placed.  A sterile speculum was placed in the vagina.  The cervix was grasped with a single-tooth tenaculum and dilated with Kennon Rounds dilators.  The ZUMI uterine manipulator with a small colpotomizer ring was placed with difficulty due to a narrow vagina and extreme adiposity.  A pneum occluder balloon was placed over the manipulator.  OG tube placement was confirmed and to suction.   Next, a 5 mm skin incision was made 1 cm below the subcostal margin in the midclavicular line.  The 5 mm Optiview port and scope was used for direct entry.  Opening pressure was under 10 mm CO2.  The abdomen was insufflated and the findings were noted as above.   At this point and all points during the procedure, the patient's intra-abdominal pressure did not exceed 15 mmHg. Next, a 10 mm skin incision was made a handspan above the umbilicus and  a right and left port was placed about 10 cm lateral to the robot port on the right and left side.  A fourth arm was placed in the left lower quadrant 2 cm above and superior and medial to the  anterior superior iliac spine.  All ports were placed under direct visualization.  The patient was placed in steep Trendelenburg.  Bowel was folded away into the upper abdomen.  The robot was docked in the normal manner.  Sharp and bipolar dissection was employed to liberate the distal pedicle of omentum from its attachments to the uterus. It was transected and removed and placed in an endocatch bag. The omentum was highly vascular but hemostatic. During placement of other instruments during the procedure there was some abrasion of the omentum whch was unavoidable due to the size of the omentum.This was made hemostatic with careful use of bipolar cautery.  Additional adhesions between the sigmoid colon and left tube and ovary and uterine fundus were then carefully dissected.  The hysterectomy was started after the round ligament on the right side was incised and the retroperitoneum was entered and the pararectal space was developed.  The ureter was noted to be on the medial leaf of the broad ligament.  The peritoneum above the ureter was incised and stretched and the infundibulopelvic ligament was skeletonized, cauterized and cut.  The posterior peritoneum was taken down to the level of the KOH ring.  The anterior peritoneum was also taken down.  The bladder flap was created to the level of the KOH ring.  The uterine artery on the right side was skeletonized, cauterized and cut in the normal manner.  A similar procedure was performed on the left.  The colpotomy was made and the uterus, cervix, bilateral ovaries and tubes were amputated and delivered through the vagina.  Pedicles were inspected and excellent hemostasis was achieved.    The colpotomy at the vaginal cuff was closed with Vicryl on a CT1 needle in a running manner.  There were sulcal extensions of the colpotomy bilaterally which were also closed with 0-vicryl suture and made hemostatic. Irrigation was used and excellent hemostasis was achieved.   At this point in the procedure was completed.  Robotic instruments were removed under direct visulaization.  Bipolar sealing was employed at the peritoneum of the right lateral robotic port as this appeared to traverse the epigastric vessels. No bleeding was identified. The robot was undocked. The 10 mm ports were closed with Vicryl on a UR-5 needle and the fascia was closed with 0 Vicryl on a UR-5 needle.  The skin was closed with 4-0 Vicryl in a subcuticular manner.  Dermabond was applied.  Sponge, lap and needle counts correct x 2.  The patient was taken to the recovery room in stable condition.  The vagina was swabbed with  minimal bleeding noted.   All instrument and needle counts were correct x  3.   The patient was transferred to the recovery room in a stable condition.  Donaciano Eva, MD

## 2017-10-01 ENCOUNTER — Encounter (HOSPITAL_COMMUNITY): Payer: Self-pay | Admitting: Gynecologic Oncology

## 2017-10-01 DIAGNOSIS — R971 Elevated cancer antigen 125 [CA 125]: Secondary | ICD-10-CM | POA: Diagnosis not present

## 2017-10-01 DIAGNOSIS — Z7982 Long term (current) use of aspirin: Secondary | ICD-10-CM | POA: Diagnosis not present

## 2017-10-01 DIAGNOSIS — I129 Hypertensive chronic kidney disease with stage 1 through stage 4 chronic kidney disease, or unspecified chronic kidney disease: Secondary | ICD-10-CM | POA: Diagnosis not present

## 2017-10-01 DIAGNOSIS — N736 Female pelvic peritoneal adhesions (postinfective): Secondary | ICD-10-CM | POA: Diagnosis not present

## 2017-10-01 DIAGNOSIS — D271 Benign neoplasm of left ovary: Secondary | ICD-10-CM | POA: Diagnosis not present

## 2017-10-01 DIAGNOSIS — F419 Anxiety disorder, unspecified: Secondary | ICD-10-CM | POA: Diagnosis not present

## 2017-10-01 DIAGNOSIS — N950 Postmenopausal bleeding: Secondary | ICD-10-CM | POA: Diagnosis not present

## 2017-10-01 DIAGNOSIS — E1121 Type 2 diabetes mellitus with diabetic nephropathy: Secondary | ICD-10-CM | POA: Diagnosis not present

## 2017-10-01 DIAGNOSIS — N183 Chronic kidney disease, stage 3 (moderate): Secondary | ICD-10-CM | POA: Diagnosis not present

## 2017-10-01 DIAGNOSIS — G4730 Sleep apnea, unspecified: Secondary | ICD-10-CM | POA: Diagnosis not present

## 2017-10-01 DIAGNOSIS — C541 Malignant neoplasm of endometrium: Secondary | ICD-10-CM | POA: Diagnosis not present

## 2017-10-01 DIAGNOSIS — Z794 Long term (current) use of insulin: Secondary | ICD-10-CM | POA: Diagnosis not present

## 2017-10-01 DIAGNOSIS — K219 Gastro-esophageal reflux disease without esophagitis: Secondary | ICD-10-CM | POA: Diagnosis not present

## 2017-10-01 DIAGNOSIS — Z6841 Body Mass Index (BMI) 40.0 and over, adult: Secondary | ICD-10-CM | POA: Diagnosis not present

## 2017-10-01 DIAGNOSIS — D270 Benign neoplasm of right ovary: Secondary | ICD-10-CM | POA: Diagnosis not present

## 2017-10-01 LAB — BASIC METABOLIC PANEL
Anion gap: 6 (ref 5–15)
BUN: 28 mg/dL — AB (ref 8–23)
CHLORIDE: 107 mmol/L (ref 98–111)
CO2: 24 mmol/L (ref 22–32)
Calcium: 8.5 mg/dL — ABNORMAL LOW (ref 8.9–10.3)
Creatinine, Ser: 1.28 mg/dL — ABNORMAL HIGH (ref 0.44–1.00)
GFR, EST AFRICAN AMERICAN: 48 mL/min — AB (ref >60)
GFR, EST NON AFRICAN AMERICAN: 41 mL/min — AB (ref >60)
Glucose, Bld: 284 mg/dL — ABNORMAL HIGH (ref 70–99)
Potassium: 5.4 mmol/L — ABNORMAL HIGH (ref 3.5–5.1)
SODIUM: 137 mmol/L (ref 135–145)

## 2017-10-01 LAB — CBC
HCT: 31.3 % — ABNORMAL LOW (ref 36.0–46.0)
HEMOGLOBIN: 10.3 g/dL — AB (ref 12.0–15.0)
MCH: 29 pg (ref 26.0–34.0)
MCHC: 32.9 g/dL (ref 30.0–36.0)
MCV: 88.2 fL (ref 78.0–100.0)
PLATELETS: 185 10*3/uL (ref 150–400)
RBC: 3.55 MIL/uL — AB (ref 3.87–5.11)
RDW: 14 % (ref 11.5–15.5)
WBC: 10.6 10*3/uL — ABNORMAL HIGH (ref 4.0–10.5)

## 2017-10-01 LAB — GLUCOSE, CAPILLARY
GLUCOSE-CAPILLARY: 244 mg/dL — AB (ref 70–99)
GLUCOSE-CAPILLARY: 283 mg/dL — AB (ref 70–99)
Glucose-Capillary: 201 mg/dL — ABNORMAL HIGH (ref 70–99)

## 2017-10-01 MED ORDER — SENNOSIDES-DOCUSATE SODIUM 8.6-50 MG PO TABS: 2.0000 | ORAL_TABLET | Freq: Every day | ORAL | 0 refills | Status: DC

## 2017-10-01 MED ORDER — IBUPROFEN 600 MG PO TABS: 600.0000 mg | ORAL_TABLET | Freq: Four times a day (QID) | ORAL | 0 refills | Status: DC

## 2017-10-01 MED ORDER — ACETAMINOPHEN 500 MG PO TABS: 1000.0000 mg | ORAL_TABLET | Freq: Two times a day (BID) | ORAL | 0 refills | Status: DC

## 2017-10-01 MED ORDER — OXYCODONE HCL 5 MG PO TABS: 5.0000 mg | ORAL_TABLET | ORAL | 0 refills | Status: DC | PRN

## 2017-10-01 NOTE — Progress Notes (Signed)
Patient discharged to home with family. Given all belongings, instructions, prescriptions, equipment. Patient verbalized understanding of instructions. Escorted to pov via w/c.

## 2017-10-01 NOTE — Discharge Instructions (Signed)
10/01/2017  Return to work: 4 weeks  Activity: 1. Be up and out of the bed during the day.  Take a nap if needed.  You may walk up steps but be careful and use the hand rail.  Stair climbing will tire you more than you think, you may need to stop part way and rest.   2. No lifting or straining for 6 weeks.  3. No driving for 1 weeks.  Do Not drive if you are taking narcotic pain medicine.  4. Shower daily.  Use soap and water on your incision and pat dry; don't rub.   5. No sexual activity and nothing in the vagina for 8 weeks.  Medications:  - Take ibuprofen and tylenol first line for pain control. Take these regularly (every 6 hours) to decrease the build up of pain.  - If necessary, for severe pain not relieved by ibuprofen, take percocet.  - While taking percocet you should take sennakot every night to reduce the likelihood of constipation. If this causes diarrhea, stop its use.  Diet: 1. Low sodium Heart Healthy Diet is recommended.  2. It is safe to use a laxative if you have difficulty moving your bowels.   Wound Care: 1. Keep clean and dry.  Shower daily.  Reasons to call the Doctor:   Fever - Oral temperature greater than 100.4 degrees Fahrenheit  Foul-smelling vaginal discharge  Difficulty urinating  Nausea and vomiting  Increased pain at the site of the incision that is unrelieved with pain medicine.  Difficulty breathing with or without chest pain  New calf pain especially if only on one side  Sudden, continuing increased vaginal bleeding with or without clots.   Follow-up: 1. See [name] in 3 weeks.  Contacts: For questions or concerns you should contact:  Dr. [name] at [phone] After hours and on week-ends call [phone] and ask to speak to the physician on call for Gynecologic Oncology

## 2017-10-01 NOTE — Progress Notes (Signed)
Inpatient Diabetes Program Recommendations  AACE/ADA: New Consensus Statement on Inpatient Glycemic Control (2015)  Target Ranges:  Prepandial:   less than 140 mg/dL      Peak postprandial:   less than 180 mg/dL (1-2 hours)      Critically ill patients:  140 - 180 mg/dL   Results for Elizabeth Crosby, Elizabeth Crosby (MRN [id number]) as of 10/01/2017 09:00  Ref. Range 09/30/2017 06:59 09/30/2017 11:34 09/30/2017 12:09 09/30/2017 16:58 09/30/2017 20:24  Glucose-Capillary Latest Ref Range: 70 - 99 mg/dL 95 149 (H) 160 (H) 184 (H)  14 units NOVOLOG  225 (H)    30 units LANTUS   Results for Elizabeth Crosby, Elizabeth Crosby (MRN [id number]) as of 10/01/2017 09:00  Ref. Range 10/01/2017 00:08 10/01/2017 07:43  Glucose-Capillary Latest Ref Range: 70 - 99 mg/dL 283 (H) 244 (H)  17 units NOVOLOG     Admit For Hysterectomy/Oopherectomy.   History: DM  Home DM Meds: Tresiba 90 units daily                              Novolog 10 units Breakfast/ 10 units Lunch/ 20 units Dinner                              Metformin 850 mg daily  Current Orders: Lantus 30 units QHS                            Novolog Resistant Correction Scale/ SSI (0-20 units) TID AC       Novolog 10 units TID with meals       Note Lantus and Novolog started last PM.  Patient received 10 mg Decadron X 1 dose yesterday at ~10am.    MD- Please consider the following in-hospital insulin adjustments:  1. Increase Lantus to 45 units QHS (50% total home dose)  2. Increase Novolog Meal Coverage to: Novolog 12 units TID with meals  (Please add the following Hold Parameters: Hold if pt eats <50% of meal, Hold if pt NPO)      --Will follow patient during hospitalization--  Wyn Quaker RN, MSN, CDE Diabetes Coordinator Inpatient Glycemic Control Team Team Pager: [phone] (8a-5p)

## 2017-10-01 NOTE — Discharge Summary (Signed)
Physician Discharge Summary  Patient ID: Elizabeth Crosby MRN: [id number] DOB/AGE: 06-20-1945 72 y.o.  Admit date: 09/30/2017 Discharge date: 10/01/2017  Admission Diagnoses: Endometrial cancer [facility])  Discharge Diagnoses:  Principal Problem:   Endometrial cancer [facility]) Active Problems:   Postmenopausal bleeding   Pelvic mass   Morbid obesity ([place])   Pelvic mass in female   Discharged Condition: good  Hospital Course:  1/ patient was admitted on 09/30/17 for robotic assisted total hysterectomy, BSO for ovarian mass and postmenopausal bleeding.    2/ surgery was uncomplicated  3/ on postoperative day 1 the patient was meeting discharge criteria: tolerating PO, voiding urine, ambulating, pain well controlled on oral medications.  4/ new medications on discharge include ibuprofen, tylenol, senakot and percocet.  Aspirin is held for 10 days. She has hyperglycemia (known to have poor glycemic control preop). Not DKA levels. Counseled patient regarding the importance of glycemic control.  Consults: None  Significant Diagnostic Studies: labs:  CBC    Component Value Date/Time   WBC 10.6 (H) 10/01/2017 0527   RBC 3.55 (L) 10/01/2017 0527   HGB 10.3 (L) 10/01/2017 0527   HCT 31.3 (L) 10/01/2017 0527   PLT 185 10/01/2017 0527   MCV 88.2 10/01/2017 0527   MCH 29.0 10/01/2017 0527   MCHC 32.9 10/01/2017 0527   RDW 14.0 10/01/2017 0527   BMET    Component Value Date/Time   NA 137 10/01/2017 0527   K 5.4 (H) 10/01/2017 0527   CL 107 10/01/2017 0527   CO2 24 10/01/2017 0527   GLUCOSE 284 (H) 10/01/2017 0527   BUN 28 (H) 10/01/2017 0527   CREATININE 1.28 (H) 10/01/2017 0527   CALCIUM 8.5 (L) 10/01/2017 0527   GFRNONAA 41 (L) 10/01/2017 0527   GFRAA 48 (L) 10/01/2017 0527     Treatments: surgery: see above  Discharge Exam: Blood pressure 99/60, pulse 80, temperature 97.9 F (36.6 C), temperature source Oral, resp. rate 18, height 5\' 1"  (1.549 m), weight 228 lb (103.4 kg), SpO2  99 %. General appearance: alert and cooperative GI: soft, non-tender; bowel sounds normal; no masses,  no organomegaly Incision/Wound: clean and intact x 5 (mild surrounding erythema)   Disposition:    Allergies as of 10/01/2017      Reactions   Lipitor [atorvastatin] Other (See Comments)   Leg cramps   Other Nausea And Vomiting   Pt states does not know drug but anesthesia drugs have made her have nausea and vomiting.    Sulfa Antibiotics    diarrhea   Potassium-containing Compounds Rash      Medication List    TAKE these medications   acetaminophen 500 MG tablet Commonly known as:  TYLENOL Take 2 tablets (1,000 mg total) by mouth every 12 (twelve) hours.   amLODipine 5 MG tablet Commonly known as:  NORVASC Take 5 mg by mouth every morning.   aspirin EC 81 MG tablet Take 81 mg by mouth every morning.   cetirizine 10 MG tablet Commonly known as:  ZYRTEC Take 10 mg by mouth daily.   fenofibrate 160 MG tablet Take 160 mg by mouth every morning.   hydrocortisone cream 1 % Apply 1 application topically daily as needed for itching.   ibuprofen 600 MG tablet Commonly known as:  ADVIL,MOTRIN Take 1 tablet (600 mg total) by mouth every 6 (six) hours.   insulin aspart 100 UNIT/ML injection Commonly known as:  novoLOG Inject 10-20 Units into the skin See admin instructions. Inject 10 units at breakfast  and lunch and 20 units with supper   loperamide 2 MG tablet Commonly known as:  IMODIUM A-D Take 4 mg by mouth as needed for diarrhea or loose stools.   losartan-hydrochlorothiazide 100-25 MG tablet Commonly known as:  HYZAAR Take 1 tablet by mouth daily.   LUBRICATING EYE DROPS OP Place 1 drop into both eyes daily as needed (irritation).   Melatonin 5 MG Tabs Take 5-10 mg by mouth at bedtime as needed (sleep).   metFORMIN 850 MG tablet Commonly known as:  GLUCOPHAGE Take 850 mg by mouth every evening.   omeprazole 20 MG capsule Commonly known as:   PRILOSEC Take 20 mg by mouth daily.   oxyCODONE 5 MG immediate release tablet Commonly known as:  Oxy IR/ROXICODONE Take 1 tablet (5 mg total) by mouth every 4 (four) hours as needed for severe pain or breakthrough pain.   senna-docusate 8.6-50 MG tablet Commonly known as:  Senokot-S Take 2 tablets by mouth at bedtime.   simvastatin 20 MG tablet Commonly known as:  ZOCOR Take 20 mg by mouth every evening.   traMADol 50 MG tablet Commonly known as:  ULTRAM Take 25-50 mg by mouth 2 (two) times daily as needed for severe pain.   TRESIBA FLEXTOUCH 200 UNIT/ML Sopn Generic drug:  Insulin Degludec Inject 90 Units into the skin daily with breakfast. On a normal day I take 90 units, if it gets in the 70s , I take 5 or 6 units less than the normal        Signed: Thereasa Solo 10/01/2017, 9:09 AM

## 2017-10-06 ENCOUNTER — Telehealth: Payer: Self-pay

## 2017-10-06 NOTE — Telephone Encounter (Signed)
Told Ms Heffernan that the surgical path showed the endometrial cancer as a Grade 1.  Dr. [name] said that no further follow up is needed.  Keep the post op appointment on 10-24-17 as scheduled.

## 2017-10-24 ENCOUNTER — Encounter: Payer: Self-pay | Admitting: Gynecologic Oncology

## 2017-10-24 ENCOUNTER — Encounter: Payer: Self-pay | Admitting: Oncology

## 2017-10-24 ENCOUNTER — Inpatient Hospital Stay: Payer: BLUE CROSS/BLUE SHIELD | Attending: Gynecologic Oncology | Admitting: Gynecologic Oncology

## 2017-10-24 ENCOUNTER — Encounter: Payer: Self-pay | Admitting: Radiation Oncology

## 2017-10-24 VITALS — BP 143/51 | HR 80 | Temp 98.5°F | Resp 20 | Ht 61.5 in | Wt 228.3 lb

## 2017-10-24 DIAGNOSIS — Z90710 Acquired absence of both cervix and uterus: Secondary | ICD-10-CM

## 2017-10-24 DIAGNOSIS — C541 Malignant neoplasm of endometrium: Secondary | ICD-10-CM | POA: Insufficient documentation

## 2017-10-24 DIAGNOSIS — R0602 Shortness of breath: Secondary | ICD-10-CM | POA: Diagnosis not present

## 2017-10-24 DIAGNOSIS — Z8679 Personal history of other diseases of the circulatory system: Secondary | ICD-10-CM

## 2017-10-24 DIAGNOSIS — Z7189 Other specified counseling: Secondary | ICD-10-CM

## 2017-10-24 DIAGNOSIS — Z90722 Acquired absence of ovaries, bilateral: Secondary | ICD-10-CM | POA: Diagnosis not present

## 2017-10-24 NOTE — Patient Instructions (Addendum)
Dr. Serita Grit office will call you with the appointments for radiation and cardiology referrals.

## 2017-10-24 NOTE — Progress Notes (Signed)
Follow-up Note: Gyn-Onc  Consult was initially requested by Dr. Simona Huh for the evaluation of Elizabeth Crosby 72 y.o. female  CC:  Chief Complaint  Patient presents with  . Endometrial cancer [facility])    Assessment/Plan:  Elizabeth Crosby  is a 72 y.o.  year old a stage IB grade 1endometrioid endometrial adenocarcinoma (MSI stable, MMR normal).  1/ endometrial cancer Pathology revealed low risk factors for recurrence, therefore no adjuvant therapy is recommended according to NCCN guidelines.  I discussed risk for recurrence and typical symptoms encouraged her to notify us of these should they develop between visits.  I recommend she have follow-up every 6 months for 5 years in accordance with NCCN guidelines. Those visits should include symptom assessment, physical exam and pelvic examination. Pap smears are not indicated or recommended in the routine surveillance of endometrial cancer  2/ SOB on exertion. Given history of "valve disease" and significant cardiac risk factors, will order echo and have her seen by cardiology. Her former cardiologist, Dr Rex Kras, has retired.   HPI: Elizabeth Crosby is a 72 year old P2 who is seen in consultation at the request of Dr Simona Huh for a right adnexal mass, elevated CA 125, postmenopausal bleeding, AGUS pap.  The patient is morbidly obese and has a history of 2 prior vaginal deliveries remotely. She is postmenopausal for approximately 20 years. She has always had normal paps (though last pap was more than 5 years ago).  In May, 2019 she developed vaginal bleeding. She was seen by Dr Simona Huh on 09/03/17 and a TVUS was performed which revealed a uterus measuring 7.1 x 4.4x3.1cm with a thickened endometrium on 2cm. The endometrium was heterogeneous and had internal areas of associated vascularity.  The right ovary was not well delineated and contacted a complex cystic lesion measuring 4x3.7x3.3cm. There were a few scattered internal septations without  appreciable vascularity.   Pap smear from 09/03/17 showed AGUS with HPV not detected.  CA 125 on 6/4//19 was 74.1  Endometrial biopsy on 09/03/17 showed fragments of benign endometrial tissue with eosinophilic change and papillary metaplasia and no atypia, hyperplasia or malignancy.    She reports a history of morbid obesity, hypertension (with associated LVH, last echo in 2015 showed EF of 60%). She has hypertensive nephropathy with stage 3 CKD. She hs severe lower extremity arthritis. She cannot walk up a flight of stairs due to knee pain and shortness of breath. She has no chest pain.  She has had an open cholecystectomy and appendectomy.  She takes baby ASA daily.   Her father and brother both died from pancreatic cancer. Her maternal aunt has a history of breast cancer.   Interval Hx: On 09/30/17 she underwent robotic assisted total hysterectomy, BSO. Staging was not performed due to extreme morbid obesity.  Intraoperative frozen section of the ovary revealed a benign ovarian cyst and low grade endometrial cancer.   Final pathology revealed a benign ovarian cyst, however the endometrium contained a 4.5cm grade 1 endometrioid tumor with 16 of 50m (outer half) myometrial invasion. There was no LVSI. The tumor was MMR normal on IHC and MSI stable.  Due to her age (>70), this single high risk feature of deep myo invasion meant that she met high/intermediate risk factor criteria, and therefore adjuvant therapy with vaginal brachytherapy was recommended in accordance with NCCN guidelines.   Since surgery she reports persistent SOB on exertion which was not present prior to surgery. She has minimal pain. She has no chest  pain.   Current Meds:  Outpatient Encounter Medications as of 10/24/2017  Medication Sig  . acetaminophen (TYLENOL) 500 MG tablet Take 2 tablets (1,000 mg total) by mouth every 12 (twelve) hours.  Marland Kitchen amLODipine (NORVASC) 5 MG tablet Take 5 mg by mouth every morning.   Marland Kitchen  aspirin EC 81 MG tablet Take 81 mg by mouth every morning.  . Carboxymethylcellul-Glycerin (LUBRICATING EYE DROPS OP) Place 1 drop into both eyes daily as needed (irritation).  . cetirizine (ZYRTEC) 10 MG tablet Take 10 mg by mouth daily.   . fenofibrate 160 MG tablet Take 160 mg by mouth every morning.   . hydrocortisone cream 1 % Apply 1 application topically daily as needed for itching.  Marland Kitchen ibuprofen (ADVIL,MOTRIN) 600 MG tablet Take 1 tablet (600 mg total) by mouth every 6 (six) hours.  . insulin aspart (NOVOLOG) 100 UNIT/ML injection Inject 10-20 Units into the skin See admin instructions. Inject 10 units at breakfast and lunch and 20 units with supper  . Insulin Degludec (TRESIBA FLEXTOUCH) 200 UNIT/ML SOPN Inject 90 Units into the skin daily with breakfast. On a normal day I take 90 units, if it gets in the 70s , I take 5 or 6 units less than the normal  . loperamide (IMODIUM A-D) 2 MG tablet Take 4 mg by mouth as needed for diarrhea or loose stools.  Marland Kitchen losartan-hydrochlorothiazide (HYZAAR) 100-25 MG per tablet Take 1 tablet by mouth daily.   . Melatonin 5 MG TABS Take 5-10 mg by mouth at bedtime as needed (sleep).  . metFORMIN (GLUCOPHAGE) 850 MG tablet Take 850 mg by mouth every evening.   . senna-docusate (SENOKOT-S) 8.6-50 MG tablet Take 2 tablets by mouth at bedtime.  . simvastatin (ZOCOR) 20 MG tablet Take 20 mg by mouth every evening.  . traMADol (ULTRAM) 50 MG tablet Take 25-50 mg by mouth 2 (two) times daily as needed for severe pain.  . [DISCONTINUED] omeprazole (PRILOSEC) 20 MG capsule Take 20 mg by mouth daily.   . [DISCONTINUED] oxyCODONE (OXY IR/ROXICODONE) 5 MG immediate release tablet Take 1 tablet (5 mg total) by mouth every 4 (four) hours as needed for severe pain or breakthrough pain. (Patient not taking: Reported on 10/24/2017)   No facility-administered encounter medications on file as of 10/24/2017.     Allergy:  Allergies  Allergen Reactions  . Lipitor  [Atorvastatin] Other (See Comments)    Leg cramps  . Other Nausea And Vomiting    Pt states does not know drug but anesthesia drugs have made her have nausea and vomiting.   . Sulfa Antibiotics     diarrhea  . Potassium-Containing Compounds Rash    Social Hx:   Social History   Socioeconomic History  . Marital status: Married    Spouse name: Not on file  . Number of children: Not on file  . Years of education: Not on file  . Highest education level: Not on file  Occupational History  . Not on file  Social Needs  . Financial resource strain: Not on file  . Food insecurity:    Worry: Not on file    Inability: Not on file  . Transportation needs:    Medical: Not on file    Non-medical: Not on file  Tobacco Use  . Smoking status: Never Smoker  . Smokeless tobacco: Never Used  Substance and Sexual Activity  . Alcohol use: No  . Drug use: No  . Sexual activity: Yes  Lifestyle  .  Physical activity:    Days per week: Not on file    Minutes per session: Not on file  . Stress: Not on file  Relationships  . Social connections:    Talks on phone: Not on file    Gets together: Not on file    Attends religious service: Not on file    Active member of club or organization: Not on file    Attends meetings of clubs or organizations: Not on file    Relationship status: Not on file  . Intimate partner violence:    Fear of current or ex partner: Not on file    Emotionally abused: Not on file    Physically abused: Not on file    Forced sexual activity: Not on file  Other Topics Concern  . Not on file  Social History Narrative  . Not on file    Past Surgical Hx:  Past Surgical History:  Procedure Laterality Date  . APPENDECTOMY  1972  . CATARACT EXTRACTION W/PHACO Right 01/10/2014   Procedure: CATARACT EXTRACTION PHACO AND INTRAOCULAR LENS PLACEMENT ([place]);  Surgeon: Tonny Branch, MD;  Location: [facility];  Service: Ophthalmology;  Laterality: Right;  CDE:5.78  . CATARACT  EXTRACTION W/PHACO Left 01/27/2014   Procedure: CATARACT EXTRACTION PHACO AND INTRAOCULAR LENS PLACEMENT LEFT EYE CDE=5.75;  Surgeon: Tonny Branch, MD;  Location: [facility];  Service: Ophthalmology;  Laterality: Left;  . CHOLECYSTECTOMY  1972  . COLONOSCOPY WITH PROPOFOL N/A 09/01/2012   Procedure: COLONOSCOPY WITH PROPOFOL;  Surgeon: Garlan Fair, MD;  Location: [facility];  Service: Endoscopy;  Laterality: N/A;  . KIDNEY STONE SURGERY    . OMENTECTOMY  09/30/2017   Procedure: OMENTECTOMY;  Surgeon: [name], MD;  Location: [facility];  Service: Gynecology;;  . ROBOTIC ASSISTED TOTAL HYSTERECTOMY WITH BILATERAL SALPINGO OOPHERECTOMY Bilateral 09/30/2017   Procedure: ROBOTIC ASSISTED TOTAL HYSTERECTOMY WITH BILATERAL SALPINGO OOPHORECTOMY;  Surgeon: [name], MD;  Location: [facility];  Service: Gynecology;  Laterality: Bilateral;    Past Medical Hx:  Past Medical History:  Diagnosis Date  . Anxiety   . Arthritis    "knees are bone on bone and i see dr. Tonita Cong for the cortisone injections "   . Cellulitis    hx  of in the legs   . Chronic kidney disease (CKD), stage III (moderate) (HCC)   . Complication of anesthesia    doesnt know the name of the anesthesia , but i was vomiting while i was still under   . Diabetes mellitus without complication ([place])    type 2   . GERD (gastroesophageal reflux disease)   . Glaucoma   . Gout   . History of blood transfusion    as child after accident, after childbirth/hemorroid surgery  . History of kidney stones   . Hyperlipidemia   . Hypertension   . Leukocytes in urine   . Lumbar back pain   . Sleep apnea    non adherant with cpap device     Past Gynecological History:  SVD x 2 No LMP recorded. Patient is postmenopausal.  Family Hx:  Family History  Problem Relation Age of Onset  . Stroke Mother   . Asthma Father   . Colon cancer Father     Review of Systems:  Constitutional  Feels well,    ENT Normal appearing ears and nares  bilaterally Skin/Breast  No rash, sores, jaundice, itching, dryness Cardiovascular  No chest pain, shortness of breath, or edema  Pulmonary  +  SOB on exertion Gastro Intestinal  No nausea, vomitting, or diarrhoea. No bright red blood per rectum, no abdominal pain, change in bowel movement, or constipation.  Genito Urinary  No frequency, urgency, dysuria, no bleeding Musculo Skeletal  + lower extremity joint pains Neurologic   No weakness, numbness, change in gait,  Psychology  No depression, anxiety, insomnia.   Vitals:  Blood pressure (!) 143/51, pulse 80, temperature 98.5 F (36.9 C), temperature source Oral, resp. rate 20, height 5' 1.5" (1.562 m), weight 228 lb 4.8 oz (103.6 kg), SpO2 100 %.  Physical Exam: WD in NAD Neck  Supple NROM, without any enlargements.  Lymph Node Survey No cervical supraclavicular or inguinal adenopathy Cardiovascular  Pulse normal rate, regularity and rhythm. S1 and S2 normal.  Lungs  Clear to auscultation bilateraly, without wheezes/crackles/rhonchi. Good air movement.  Skin  No rash/lesions/breakdown  Psychiatry  Alert and oriented to person, place, and time  Abdomen  Normoactive bowel sounds, abdomen soft, non-tender and obese without evidence of hernia. Incisions with some crusting but no infection or cellulitis or drainage.  Back No CVA tenderness Genito Urinary  Vaginal cuff healing, in tact, no blood.  Rectal  deferred Extremities  No bilateral cyanosis, clubbing or edema.   Thereasa Solo, MD  10/24/2017, 5:00 PM

## 2017-10-27 NOTE — Progress Notes (Signed)
GYN Location of Tumor / Histology:stage IB grade 1endometrioid endometrial adenocarcinoma     Elizabeth Crosby presented with symptoms of:  In May, 2019 she developed vaginal bleeding.    Biopsies revealed: Uterus +/- tubes/ovaries, neoplastic, and distal omentum - ENDOMETRIOID ADENOCARCINOMA, 4.5 CM, FIGO GRADE I, VILLOGLANDULAR VARIANT. - TUMOR INVADES FOR A DEPTH OF 1.6 CM WHERE MYOMETRIAL THICKNESS IS 2.4 CM (GREATER THAN 50%).  Past/Anticipated interventions by Gyn/Onc surgery, if any: 09/30/17:  Operation: Robotic-assisted laparoscopic total hysterectomy with bilateral salpingoophorectomy, omentectomy.    Surgeon: Donaciano Eva    Past/Anticipated interventions by medical oncology, if any: None planned at this time.  Weight changes, if any: No weight changes.   Bowel/Bladder complaints, if any: Pt has chronic diarrhea. Pt denies dysuria/hematuria. Pt denies vaginal bleeding/discharge. Pt denies rectal bleeding.   Nausea/Vomiting, if any: Pt denies N/V. Pt denies abdominal bloating.   Pain issues, if any: Pt states has occasional movement-related pain at surgical/incision site.   SAFETY ISSUES:  Prior radiation? No  Pacemaker/ICD? No   Possible current pregnancy? No  Is the patient on methotrexate? No   Current Complaints / other details:  Pt presents today for initial consult with Dr. Sondra Come with Radiation Oncology. Pt is anxious about Radiation Oncology and knowledge deficit associated with treatments. Pt is accompanied by husband. Per pt, surgical incision is still healing with some scabbing.   BP (!) 163/65 (BP Location: Right Wrist, Patient Position: Sitting, Cuff Size: Small)   Pulse 91   Temp 99.3 F (37.4 C) (Oral)   Resp 20   SpO2 99%   Wt Readings from Last 3 Encounters:  10/24/17 228 lb 4.8 oz (103.6 kg)  09/30/17 228 lb (103.4 kg)  09/17/17 228 lb (103.4 kg)   Loma Sousa, RN BSN

## 2017-10-29 ENCOUNTER — Ambulatory Visit (HOSPITAL_COMMUNITY)
Admission: RE | Admit: 2017-10-29 | Discharge: 2017-10-29 | Disposition: A | Discharge status: Home / Self Care | Payer: BLUE CROSS/BLUE SHIELD | Source: Ambulatory Visit | Attending: Gynecologic Oncology | Admitting: Gynecologic Oncology

## 2017-10-29 DIAGNOSIS — R0602 Shortness of breath: Secondary | ICD-10-CM | POA: Diagnosis not present

## 2017-10-29 DIAGNOSIS — Z8679 Personal history of other diseases of the circulatory system: Secondary | ICD-10-CM | POA: Insufficient documentation

## 2017-10-29 DIAGNOSIS — R011 Cardiac murmur, unspecified: Secondary | ICD-10-CM | POA: Insufficient documentation

## 2017-10-30 ENCOUNTER — Ambulatory Visit
Admission: RE | Admit: 2017-10-30 | Discharge: 2017-10-30 | Disposition: A | Discharge status: Home / Self Care | Payer: BLUE CROSS/BLUE SHIELD | Source: Ambulatory Visit | Attending: Radiation Oncology | Admitting: Radiation Oncology

## 2017-10-30 ENCOUNTER — Encounter: Payer: Self-pay | Admitting: Radiation Oncology

## 2017-10-30 ENCOUNTER — Other Ambulatory Visit: Payer: Self-pay

## 2017-10-30 ENCOUNTER — Encounter: Payer: Self-pay | Admitting: Oncology

## 2017-10-30 VITALS — BP 163/65 | HR 91 | Temp 99.3°F | Resp 20

## 2017-10-30 DIAGNOSIS — Z794 Long term (current) use of insulin: Secondary | ICD-10-CM | POA: Insufficient documentation

## 2017-10-30 DIAGNOSIS — Z7982 Long term (current) use of aspirin: Secondary | ICD-10-CM | POA: Diagnosis not present

## 2017-10-30 DIAGNOSIS — E785 Hyperlipidemia, unspecified: Secondary | ICD-10-CM | POA: Diagnosis not present

## 2017-10-30 DIAGNOSIS — Z882 Allergy status to sulfonamides status: Secondary | ICD-10-CM | POA: Insufficient documentation

## 2017-10-30 DIAGNOSIS — Z79899 Other long term (current) drug therapy: Secondary | ICD-10-CM | POA: Diagnosis not present

## 2017-10-30 DIAGNOSIS — M109 Gout, unspecified: Secondary | ICD-10-CM | POA: Insufficient documentation

## 2017-10-30 DIAGNOSIS — Z90710 Acquired absence of both cervix and uterus: Secondary | ICD-10-CM | POA: Insufficient documentation

## 2017-10-30 DIAGNOSIS — N183 Chronic kidney disease, stage 3 (moderate): Secondary | ICD-10-CM | POA: Insufficient documentation

## 2017-10-30 DIAGNOSIS — C541 Malignant neoplasm of endometrium: Secondary | ICD-10-CM | POA: Insufficient documentation

## 2017-10-30 DIAGNOSIS — I129 Hypertensive chronic kidney disease with stage 1 through stage 4 chronic kidney disease, or unspecified chronic kidney disease: Secondary | ICD-10-CM | POA: Diagnosis not present

## 2017-10-30 DIAGNOSIS — H409 Unspecified glaucoma: Secondary | ICD-10-CM | POA: Diagnosis not present

## 2017-10-30 NOTE — Progress Notes (Signed)
Radiation Oncology         [phone] ________________________________  Initial Outpatient Consultation  Name: [name] MRN: [id number]  Date: 10/30/2017  DOB: March 11, 1946  FB:XUXY, Nathen May, MD  [name], MD   REFERRING PHYSICIAN: [name], MD  DIAGNOSIS: Stage IB grade 1 Endometrioid Endometrial Adenocarcinoma (MSI stable, MMR normal)  HISTORY OF PRESENT ILLNESS::Elizabeth Crosby is a 72 y.o. female who is accompanied by her husband. She presented to her OBGYN, Dr. Simona Huh, on 09/03/17 with vaginal bleeding. She reports feeling vaginal cysts bursting, leading to vaginal bleeding. A pap smear and biopsy were completed that day. The pap smear came back abnormal. The endometrial biopsy revealed fragments of benign endometrial tissue and eosinophilic change and papillary metaplasia, with no atypia, hyperplasia, or malignancy identified. She was then referred to Dr. [name], who she saw on 09/17/17.  An abdomen/pelvic CT scan on 09/23/17 revealed: ill-defined soft tissue mass in central uterus, which may represent a fibroid or endometrial hyperplasia/carcinoma, recommend pelvic MRI without and with contrast for further evaluation; 4 cm soft tissue density in the right adnexa may represent a prominent right ovary, however, ovarian or adnexal mass cannot definitely be excluded, this could also be evaluated by MRI; colonic diverticulosis, without radiographic evidence of diverticulitis; mild hepatic steatosis.  On 09/30/17, the patient underwent a total hysterectomy and oophorectomy by Dr. [name]. The biopsy showed: endometrioid adenocarcinoma, 4.5 cm, FIGO grade 1, villoglandular variant; tumor invades for a depth of 1.6 cm where myometrial thickness is 2.4 cm (greater than 50%). The patient was able unable to have a lymphadenectomy due to extreme adiposity and retroperitoneal adiposity.  Dr. [name] performed an internal exam on 10/24/17 which was normal with vaginal cuff healing well. She also  ordered an echocardiogram.  She notes tiring when walking up steps, and scabbing to abdominal scars.   PREVIOUS RADIATION THERAPY: No  PAST MEDICAL HISTORY:  has a past medical history of Anxiety, Arthritis, Cellulitis, Chronic kidney disease (CKD), stage III (moderate) ([place]), Complication of anesthesia, Diabetes mellitus without complication ([place]), GERD (gastroesophageal reflux disease), Glaucoma, Gout, History of blood transfusion, History of kidney stones, Hyperlipidemia, Hypertension, Leukocytes in urine, Lumbar back pain, and Sleep apnea.    PAST SURGICAL HISTORY: Past Surgical History:  Procedure Laterality Date  . APPENDECTOMY  1972  . CATARACT EXTRACTION W/PHACO Right 01/10/2014   Procedure: CATARACT EXTRACTION PHACO AND INTRAOCULAR LENS PLACEMENT ([place]);  Surgeon: Tonny Branch, MD;  Location: [facility];  Service: Ophthalmology;  Laterality: Right;  CDE:5.78  . CATARACT EXTRACTION W/PHACO Left 01/27/2014   Procedure: CATARACT EXTRACTION PHACO AND INTRAOCULAR LENS PLACEMENT LEFT EYE CDE=5.75;  Surgeon: Tonny Branch, MD;  Location: [facility];  Service: Ophthalmology;  Laterality: Left;  . CHOLECYSTECTOMY  1972  . COLONOSCOPY WITH PROPOFOL N/A 09/01/2012   Procedure: COLONOSCOPY WITH PROPOFOL;  Surgeon: Garlan Fair, MD;  [facility];  Service: Endoscopy;  Laterality: N/A;  . KIDNEY STONE SURGERY    . OMENTECTOMY  09/30/2017   Procedure: OMENTECTOMY;  Surgeon: [name], MD;  [facility];  Service: Gynecology;;  . ROBOTIC ASSISTED TOTAL HYSTERECTOMY WITH BILATERAL SALPINGO OOPHERECTOMY Bilateral 09/30/2017   Procedure: ROBOTIC ASSISTED TOTAL HYSTERECTOMY WITH BILATERAL SALPINGO OOPHORECTOMY;  Surgeon: [name], MD;  [facility];  Service: Gynecology;  Laterality: Bilateral;    FAMILY HISTORY: family history includes Asthma in her father; Colon cancer in her father; Stroke in her mother.   SOCIAL HISTORY:  reports that she has never smoked. She has never  used smokeless  tobacco. She reports that she does not drink alcohol or use drugs.  ALLERGIES: Lipitor [atorvastatin]; Other; Sulfa antibiotics; and Potassium-containing compounds  MEDICATIONS:  Current Outpatient Medications  Medication Sig Dispense Refill  . acetaminophen (TYLENOL) 500 MG tablet Take 2 tablets (1,000 mg total) by mouth every 12 (twelve) hours. 30 tablet 0  . amLODipine (NORVASC) 5 MG tablet Take 5 mg by mouth every morning.     Marland Kitchen aspirin EC 81 MG tablet Take 81 mg by mouth every morning.    . Carboxymethylcellul-Glycerin (LUBRICATING EYE DROPS OP) Place 1 drop into both eyes daily as needed (irritation).    . cetirizine (ZYRTEC) 10 MG tablet Take 10 mg by mouth daily.     . fenofibrate 160 MG tablet Take 160 mg by mouth every morning.     . hydrocortisone cream 1 % Apply 1 application topically daily as needed for itching.    Marland Kitchen ibuprofen (ADVIL,MOTRIN) 600 MG tablet Take 1 tablet (600 mg total) by mouth every 6 (six) hours. 30 tablet 0  . insulin aspart (NOVOLOG) 100 UNIT/ML injection Inject 10-20 Units into the skin See admin instructions. Inject 10 units at breakfast and lunch and 20 units with supper    . Insulin Degludec (TRESIBA FLEXTOUCH) 200 UNIT/ML SOPN Inject 90 Units into the skin daily with breakfast. On a normal day I take 90 units, if it gets in the 70s , I take 5 or 6 units less than the normal    . loperamide (IMODIUM A-D) 2 MG tablet Take 4 mg by mouth as needed for diarrhea or loose stools.    Marland Kitchen losartan-hydrochlorothiazide (HYZAAR) 100-25 MG per tablet Take 1 tablet by mouth daily.     . Melatonin 5 MG TABS Take 5-10 mg by mouth at bedtime as needed (sleep).    . metFORMIN (GLUCOPHAGE) 850 MG tablet Take 850 mg by mouth every evening.     . senna-docusate (SENOKOT-S) 8.6-50 MG tablet Take 2 tablets by mouth at bedtime. 30 tablet 0  . simvastatin (ZOCOR) 20 MG tablet Take 20 mg by mouth every evening.    . traMADol (ULTRAM) 50 MG tablet Take 25-50 mg by mouth 2 (two) times  daily as needed for severe pain.     No current facility-administered medications for this encounter.     REVIEW OF SYSTEMS: REVIEW OF SYSTEMS: A 10+ POINT REVIEW OF SYSTEMS WAS OBTAINED including neurology, dermatology, psychiatry, cardiac, respiratory, lymph, extremities, GI, GU, musculoskeletal, constitutional, reproductive, HEENT. All pertinent positives are noted in the HPI. All others are negative.   PHYSICAL EXAM:  oral temperature is 99.3 F (37.4 C). Her blood pressure is 163/65 (abnormal) and her pulse is 91. Her respiration is 20 and oxygen saturation is 99%.   extreme morbid obesity (BMI >60kg/m2) General: Alert and oriented, in no acute distress. The patient ambulates with the assistance of a cane. She uses a wheelchair for longer distances HEENT: Head is normocephalic. Extraocular movements are intact.  Neck: Neck is supple, no palpable cervical or supraclavicular lymphadenopathy. Heart: Regular in rate and rhythm with no murmurs, rubs, or gallops. Chest: Clear to auscultation bilaterally, with no rhonchi, wheezes, or rales. Musculoskeletal: symmetric strength and muscle tone throughout. Neurologic: Cranial nerves II through XII are grossly intact. No obvious focalities. Speech is fluent. Coordination is intact. Psychiatric: Judgment and insight are intact. Affect is appropriate. Pelvic exam deferred until simulation and planning day  ECOG = 2  0 - Asymptomatic (Fully active, able to  carry on all predisease activities without restriction)  1 - Symptomatic but completely ambulatory (Restricted in physically strenuous activity but ambulatory and able to carry out work of a light or sedentary nature. For example, light housework, office work)  2 - Symptomatic, <50% in bed during the day (Ambulatory and capable of all self care but unable to carry out any work activities. Up and about more than 50% of waking hours)  3 - Symptomatic, >50% in bed, but not bedbound (Capable of only  limited self-care, confined to bed or chair 50% or more of waking hours)  4 - Bedbound (Completely disabled. Cannot carry on any self-care. Totally confined to bed or chair)  5 - Death   Eustace Pen MM, Creech RH, Tormey DC, et al. [phone]). "Toxicity and response criteria of the [facility]". [place] Oncol. 5 (6): 649-55  LABORATORY DATA:  Lab Results  Component Value Date   WBC 10.6 (H) 10/01/2017   HGB 10.3 (L) 10/01/2017   HCT 31.3 (L) 10/01/2017   MCV 88.2 10/01/2017   PLT 185 10/01/2017   Lab Results  Component Value Date   NA 137 10/01/2017   K 5.4 (H) 10/01/2017   CL 107 10/01/2017   CO2 24 10/01/2017   GLUCOSE 284 (H) 10/01/2017   CREATININE 1.28 (H) 10/01/2017   CALCIUM 8.5 (L) 10/01/2017      RADIOGRAPHY: No results found.    IMPRESSION: Stage IB grade 1 Endometrioid Endometrial Adenocarcinoma (MSI stable, MMR normal) Intake from Dr. Serita Grit note on 10/24/17:  "Final pathology revealed a benign ovarian cyst, however the endometrium contained a 4.5cm grade 1 endometrioid tumor with 16 of 66m (outer half) myometrial invasion. There was no LVSI. The tumor was MMR normal on IHC and MSI stable.  Due to her age (>70), this single high risk feature of deep myo invasion meant that she met high/intermediate risk factor criteria, and therefore adjuvant therapy with vaginal brachytherapy was recommended in accordance with NCCN guidelines." I would agree with Dr. RSerita Gritrecommendation for vaginal brachytherapy.   Today, I talked to the patient and her husband about the findings and work-up thus far.  We discussed the natural history of endometrial cancer and general treatment, highlighting the role of vaginal radiotherapy in the management.  We discussed the available radiation techniques, and focused on the details of logistics and delivery.  We reviewed the anticipated acute and late sequelae associated with radiation in this setting.  The patient was  encouraged to ask questions that I answered to the best of my ability.  A patient consent form was discussed and signed.  We retained a copy for our records.  The patient would like to proceed with radiation and will be scheduled for CT simulation.  PLAN: The patient will be scheduled to begin brachytherapy approximately 6-8 weeks after her surgery. I anticipate 5 high dose-rate treatments directed at the vaginal cuff.     ------------------------------------------------  JBlair Promise PhD, MD  This document serves as a record of services personally performed by JGery Pray MD. It was created on his behalf by KWilburn Mylar a trained medical scribe. The creation of this record is based on the scribe's personal observations and the provider's statements to them. This document has been checked and approved by the attending provider.

## 2017-11-03 ENCOUNTER — Ambulatory Visit (INDEPENDENT_AMBULATORY_CARE_PROVIDER_SITE_OTHER): Payer: BLUE CROSS/BLUE SHIELD | Admitting: Internal Medicine

## 2017-11-03 ENCOUNTER — Encounter: Payer: Self-pay | Admitting: Internal Medicine

## 2017-11-03 VITALS — BP 136/68 | HR 90 | Ht 61.5 in | Wt 228.1 lb

## 2017-11-03 DIAGNOSIS — I5032 Chronic diastolic (congestive) heart failure: Secondary | ICD-10-CM | POA: Diagnosis not present

## 2017-11-03 DIAGNOSIS — R0602 Shortness of breath: Secondary | ICD-10-CM | POA: Diagnosis not present

## 2017-11-03 DIAGNOSIS — E782 Mixed hyperlipidemia: Secondary | ICD-10-CM | POA: Diagnosis not present

## 2017-11-03 DIAGNOSIS — I10 Essential (primary) hypertension: Secondary | ICD-10-CM

## 2017-11-03 NOTE — Progress Notes (Signed)
Cardiology Office Note   Date:  11/03/2017   ID:  Ellice, [place] [birth year], MRN [id number]  PCP:  [name], MD  Cardiologist:   Dorris Carnes, MD     Pt referred by Dr Brigitte Pulse for f/u of cardiac testing  Hx of SOB  History of Present Illness: Elizabeth Crosby is a 72 y.o. female with a history of endometrial CA   During preop testing for GYN surgery she  Had EKG which showed SR and RBBB Echo showed LVEF normal   Mild diastolic dysfunction  Minimal aortic stenosis   She also underwent GXT   She walked 2 min 10 sec  Peak HR 141 (94% maximal)   No EKG changes   Negative for ischemia   Poor exercise tolerance   The pt underent gynecologic surgery without cardiac complications  The pt denies CP   She does get SOB with activity   Not new  ALso fatigued with stairs   Stops   Occasional dizziness  Pt currently recovering from surgery then will go on to have XRT/chemo  Current Meds  Medication Sig  . acetaminophen (TYLENOL) 500 MG tablet Take 2 tablets (1,000 mg total) by mouth every 12 (twelve) hours.  Marland Kitchen amLODipine (NORVASC) 5 MG tablet Take 5 mg by mouth every morning.   Marland Kitchen aspirin EC 81 MG tablet Take 81 mg by mouth every morning.  . Carboxymethylcellul-Glycerin (LUBRICATING EYE DROPS OP) Place 1 drop into both eyes daily as needed (irritation).  . cetirizine (ZYRTEC) 10 MG tablet Take 10 mg by mouth daily.   . fenofibrate 160 MG tablet Take 160 mg by mouth every morning.   . hydrocortisone cream 1 % Apply 1 application topically daily as needed for itching.  Marland Kitchen ibuprofen (ADVIL,MOTRIN) 600 MG tablet Take 1 tablet (600 mg total) by mouth every 6 (six) hours.  . insulin aspart (NOVOLOG) 100 UNIT/ML injection Inject 10-20 Units into the skin See admin instructions. Inject 10 units at breakfast and lunch and 20 units with supper  . Insulin Degludec (TRESIBA FLEXTOUCH) 200 UNIT/ML SOPN Inject 90 Units into the skin daily with breakfast. On a normal day I take 90 units, if it gets in the  70s , I take 5 or 6 units less than the normal  . loperamide (IMODIUM A-D) 2 MG tablet Take 4 mg by mouth as needed for diarrhea or loose stools.  Marland Kitchen losartan-hydrochlorothiazide (HYZAAR) 100-25 MG per tablet Take 1 tablet by mouth daily.   . Melatonin 5 MG TABS Take 5-10 mg by mouth at bedtime as needed (sleep).  . metFORMIN (GLUCOPHAGE) 850 MG tablet Take 850 mg by mouth every evening.   . senna-docusate (SENOKOT-S) 8.6-50 MG tablet Take 2 tablets by mouth at bedtime.  . simvastatin (ZOCOR) 20 MG tablet Take 20 mg by mouth every evening.  . traMADol (ULTRAM) 50 MG tablet Take 25-50 mg by mouth 2 (two) times daily as needed for severe pain.     Allergies:   Lipitor [atorvastatin]; Other; Sulfa antibiotics; and Potassium-containing compounds   Past Medical History:  Diagnosis Date  . Anxiety   . Arthritis    "knees are bone on bone and i see dr. Tonita Cong for the cortisone injections "   . Cellulitis    hx  of in the legs   . Chronic kidney disease (CKD), stage III (moderate) (HCC)   . Complication of anesthesia    doesnt know the name of the anesthesia , but i was  vomiting while i was still under   . Diabetes mellitus without complication ([place])    type 2   . GERD (gastroesophageal reflux disease)   . Glaucoma   . Gout   . History of blood transfusion    as child after accident, after childbirth/hemorroid surgery  . History of kidney stones   . Hyperlipidemia   . Hypertension   . Leukocytes in urine   . Lumbar back pain   . Sleep apnea    non adherant with cpap device     Past Surgical History:  Procedure Laterality Date  . APPENDECTOMY  1972  . CATARACT EXTRACTION W/PHACO Right 01/10/2014   Procedure: CATARACT EXTRACTION PHACO AND INTRAOCULAR LENS PLACEMENT ([place]);  Surgeon: Tonny Branch, MD;  Location: [facility];  Service: Ophthalmology;  Laterality: Right;  CDE:5.78  . CATARACT EXTRACTION W/PHACO Left 01/27/2014   Procedure: CATARACT EXTRACTION PHACO AND INTRAOCULAR LENS  PLACEMENT LEFT EYE CDE=5.75;  Surgeon: Tonny Branch, MD;  Location: [facility];  Service: Ophthalmology;  Laterality: Left;  . CHOLECYSTECTOMY  1972  . COLONOSCOPY WITH PROPOFOL N/A 09/01/2012   Procedure: COLONOSCOPY WITH PROPOFOL;  Surgeon: Garlan Fair, MD;  Location: [facility];  Service: Endoscopy;  Laterality: N/A;  . KIDNEY STONE SURGERY    . OMENTECTOMY  09/30/2017   Procedure: OMENTECTOMY;  Surgeon: [name], MD;  Location: [facility];  Service: Gynecology;;  . ROBOTIC ASSISTED TOTAL HYSTERECTOMY WITH BILATERAL SALPINGO OOPHERECTOMY Bilateral 09/30/2017   Procedure: ROBOTIC ASSISTED TOTAL HYSTERECTOMY WITH BILATERAL SALPINGO OOPHORECTOMY;  Surgeon: [name], MD;  Location: [facility];  Service: Gynecology;  Laterality: Bilateral;     Social History:  The patient  reports that she has never smoked. She has never used smokeless tobacco. She reports that she does not drink alcohol or use drugs.   Family History:  The patient's family history includes Asthma in her father; Colon cancer in her father; Stroke in her mother.    ROS:  Please see the history of present illness. All other systems are reviewed and  Negative to the above problem except as noted.    PHYSICAL EXAM: VS:  BP 136/68 (BP Location: Right Arm, Patient Position: Sitting, Cuff Size: Large)   Pulse 90   Ht 5' 1.5" (1.562 m)   Wt 228 lb 1.9 oz (103.5 kg)   SpO2 97%   BMI 42.40 kg/m   GEN: Morbidly obese 72 yo in no acute distress  HEENT: normal  Neck: no JVD, carotid bruits, or masses Cardiac: RRR; no murmurs, rubs, or gallops,no edema  Respiratory:  clear to auscultation bilaterally, normal work of breathing GI: soft, nontender, nondistended, + BS  No hepatomegaly  MS: no deformity Moving all extremities   Skin: warm and dry, no rash Neuro:  Strength and sensation are intact Psych: euthymic mood, full affect   EKG:  EKG is ordered on 6/277/19   SR 81 bpm    RBBB   LAFB     Lipid Panel No results found for:  CHOL, TRIG, HDL, CHOLHDL, VLDL, LDLCALC, LDLDIRECT    Wt Readings from Last 3 Encounters:  11/03/17 228 lb 1.9 oz (103.5 kg)  10/24/17 228 lb 4.8 oz (103.6 kg)  09/30/17 228 lb (103.4 kg)      ASSESSMENT AND PLAN:  1  Cardiac stratification.   Pt with   RBBB/LAFB on EKG   Notes no signif dizziness Preop had regular GXT    Poorly trained with rapid increase in HR and short exercise  duration.   But no changes to sugg ischemia.     I am not convinced DOE represents angina (abdominal CT does not show extensive plaquing) She is recovering and plans to go on for further treatment of CA Follow  Clinically   I do not expect problems  2   Dyslipidemi    Lipids in May 2019:   LDL 137   HDL 41  Trig 262   On fenofibrate and simvistatin  I would switch to another statin for tighter control  This is not emergent with upcoming cancer treatment Would follow in spring  3  HTN  COntinue meds   4  DM  On Insulin and oral agents  Will arrange for f/u in the spring to review   I would not make changes for now.    Current medicines are reviewed at length with the patient today.  The patient does not have concerns regarding medicines.  Signed, Dorris Carnes, MD  11/03/2017 4:38 PM    [address] [place], [place], [place]  [address] Phone: [phone]; Fax: [phone]

## 2017-11-10 ENCOUNTER — Telehealth: Payer: Self-pay | Admitting: *Deleted

## 2017-11-10 NOTE — Telephone Encounter (Signed)
RETURNED PATIENT'S PHONE CALL, LVM  

## 2017-11-14 ENCOUNTER — Encounter: Payer: Self-pay | Admitting: General Practice

## 2017-11-14 NOTE — Progress Notes (Signed)
[facility] Psychosocial Distress Screening Clinical Social Work  Clinical Social Work was referred by distress screening protocol.  The patient scored a 3 on the Psychosocial Distress Thermometer which indicates mild distress. Clinical Social Worker contacted patient by phone to assess for distress and other psychosocial needs. Unable to reach patient, left VM w my contact information and brief explanation of Support Center resources.  Encouraged her to call back if needed.    [facility] DISTRESS SCREENING 10/30/2017  Screening Type Initial Screening  Distress experienced in past week (1-10) 3  Practical problem type Insurance;Work/school  Emotional problem type Nervousness/Anxiety  Information Concerns Type Lack of info about treatment  Physical Problem type Pain;Sleep/insomnia;Constipation/diarrhea    Clinical Social Worker follow up needed: No.  If yes, follow up plan:  Beverely Pace, [place], LCSW Clinical Social Worker Phone:  [phone]

## 2017-11-17 ENCOUNTER — Telehealth: Payer: Self-pay | Admitting: Oncology

## 2017-11-17 NOTE — Telephone Encounter (Signed)
Called patient to see when she returned to work for her short term disability paperwork.  She said she returned to work on 11/07/17.

## 2017-11-18 ENCOUNTER — Telehealth: Payer: Self-pay | Admitting: *Deleted

## 2017-11-18 NOTE — Telephone Encounter (Signed)
Called patient to inform of [facility], lvm for a return call 

## 2017-11-21 DIAGNOSIS — N183 Chronic kidney disease, stage 3 (moderate): Secondary | ICD-10-CM | POA: Diagnosis not present

## 2017-11-21 DIAGNOSIS — I129 Hypertensive chronic kidney disease with stage 1 through stage 4 chronic kidney disease, or unspecified chronic kidney disease: Secondary | ICD-10-CM | POA: Diagnosis not present

## 2017-11-21 DIAGNOSIS — E782 Mixed hyperlipidemia: Secondary | ICD-10-CM | POA: Diagnosis not present

## 2017-11-21 DIAGNOSIS — E1122 Type 2 diabetes mellitus with diabetic chronic kidney disease: Secondary | ICD-10-CM | POA: Diagnosis not present

## 2017-11-24 ENCOUNTER — Telehealth: Payer: Self-pay | Admitting: *Deleted

## 2017-11-24 NOTE — Telephone Encounter (Signed)
CALLED PATIENT TO GIVE INFO., LVM FOR A RETURN CALL 

## 2017-11-26 ENCOUNTER — Telehealth: Payer: Self-pay | Admitting: *Deleted

## 2017-11-26 NOTE — Telephone Encounter (Signed)
CALLED PATIENT TO REMIND OF NEW [facility], LVM FOR A RETURN CALL ?

## 2017-11-27 ENCOUNTER — Ambulatory Visit
Admission: RE | Admit: 2017-11-27 | Discharge: 2017-11-27 | Disposition: A | Discharge status: Home / Self Care | Payer: BLUE CROSS/BLUE SHIELD | Source: Ambulatory Visit | Attending: Radiation Oncology | Admitting: Radiation Oncology

## 2017-11-27 ENCOUNTER — Encounter: Payer: Self-pay | Admitting: Oncology

## 2017-11-27 ENCOUNTER — Telehealth: Payer: Self-pay | Admitting: Oncology

## 2017-11-27 ENCOUNTER — Other Ambulatory Visit: Payer: Self-pay

## 2017-11-27 ENCOUNTER — Encounter: Payer: Self-pay | Admitting: Radiation Oncology

## 2017-11-27 VITALS — BP 181/59 | HR 80 | Temp 97.8°F | Resp 18 | Wt 234.6 lb

## 2017-11-27 DIAGNOSIS — Z794 Long term (current) use of insulin: Secondary | ICD-10-CM | POA: Insufficient documentation

## 2017-11-27 DIAGNOSIS — Z4689 Encounter for fitting and adjustment of other specified devices: Secondary | ICD-10-CM | POA: Diagnosis not present

## 2017-11-27 DIAGNOSIS — Z7982 Long term (current) use of aspirin: Secondary | ICD-10-CM | POA: Diagnosis not present

## 2017-11-27 DIAGNOSIS — C541 Malignant neoplasm of endometrium: Secondary | ICD-10-CM

## 2017-11-27 DIAGNOSIS — Z79899 Other long term (current) drug therapy: Secondary | ICD-10-CM | POA: Diagnosis not present

## 2017-11-27 DIAGNOSIS — Z882 Allergy status to sulfonamides status: Secondary | ICD-10-CM | POA: Diagnosis not present

## 2017-11-27 NOTE — Progress Notes (Signed)
  Radiation Oncology         [phone]) [phone] ________________________________  Name: Elizabeth Crosby MRN: [id number]  Date: 11/27/2017  DOB: September 06, 1945  CC: [name], MD  [name], MD  HDR BRACHYTHERAPY NOTE  DIAGNOSIS: Stage IB grade 1 Endometrioid Endometrial Adenocarcinoma (MSI stable, MMR normal)   Simple treatment device note: Patient had construction of her custom vaginal cylinder. She will be treated with a 3 cm diameter segmented cylinder. This conforms to her anatomy without undue discomfort.  Vaginal brachytherapy procedure node: The patient was brought to the [facility] suite. Identity was confirmed. All relevant records and images related to the planned course of therapy were reviewed. The patient freely provided informed written consent to proceed with treatment after reviewing the details related to the planned course of therapy. The consent form was witnessed and verified by the simulation staff. Then, the patient was set-up in a stable reproducible supine position for radiation therapy. Pelvic exam revealed the vaginal cuff to be intact. The patient's custom vaginal cylinder was placed in the proximal vagina. This was affixed to the CT/MR stabilization plate to prevent slippage. Patient tolerated the placement well.  Verification simulation note:  A fiducial marker was placed within the vaginal cylinder. An AP and lateral film was then obtained through the pelvis area. This documented accurate position of the vaginal cylinder for treatment.  HDR BRACHYTHERAPY TREATMENT  The remote afterloading device was affixed to the vaginal cylinder by catheter. Patient then proceeded to undergo her first high-dose-rate treatment directed at the proximal vagina. The patient was prescribed a dose of 6.0 gray to be delivered to the mucosal surface. Treatment length was 3 cm. Patient was treated with 1 channel using 7 dwell positions. Treatment time was 272.80 seconds. Iridium 192 was the  high-dose-rate source for treatment. The patient tolerated the treatment well. After completion of her therapy, a radiation survey was performed documenting return of the iridium source into the GammaMed safe.   PLAN: Patient will return next week for her second high-dose-rate treatment.  ________________________________  -----------------------------------  Blair Promise, PhD, MD  This document serves as a record of services personally performed by Gery Pray, MD. It was created on his behalf by Wilburn Mylar, a trained medical scribe. The creation of this record is based on the scribe's personal observations and the provider's statements to them. This document has been checked and approved by the attending provider.
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
## 2017-11-27 NOTE — Addendum Note (Signed)
Encounter addended by: Gery Pray, MD on: 11/27/2017 6:05 PM  Actions taken: LOS modified

## 2017-11-27 NOTE — Telephone Encounter (Signed)
Called [employer] regarding disability paperwork.  Advised them that she has been approved to be off from 09/17/17 to 11/07/17 from Dr. [name].  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  They verbalized agreement and asked that the original disability form to be faxed again. Faxed disability paperwork to [phone].

## 2017-11-27 NOTE — Progress Notes (Signed)
  Radiation Oncology         (336) [phone] ________________________________  Name: Elizabeth Crosby MRN: [id number]  Date: 11/27/2017  DOB: Apr 30, 1945  SIMULATION AND TREATMENT PLANNING NOTE HDR BRACHYTHERAPY  DIAGNOSIS:  Stage IB grade 1 Endometrioid Endometrial Adenocarcinoma (MSI stable, MMR normal)  NARRATIVE:  The patient was brought to the [facility].  Identity was confirmed.  All relevant records and images related to the planned course of therapy were reviewed.  The patient freely provided informed written consent to proceed with treatment after reviewing the details related to the planned course of therapy. The consent form was witnessed and verified by the simulation staff.  Then, the patient was set-up in a stable reproducible  supine position for radiation therapy.  CT images were obtained.  Surface markings were placed.  The CT images were loaded into the planning software.  Then the target and avoidance structures were contoured.  Treatment planning then occurred.  The radiation prescription was entered and confirmed.   I have requested : Brachytherapy Isodose Plan and Dosimetry Calculations to plan the radiation distribution.    PLAN:  The patient will receive 30 Gy in 5 fractions. Patient will be treated with a 3.0 cm diameter segmented cylinder with a treatment length of 3 cm. Prescription will be to the mucosal surface. Iridium 192 the high-dose-rate source.  -----------------------------------  Blair Promise, PhD, MD  This document serves as a record of services personally performed by Gery Pray, MD. It was created on his behalf by Wilburn Mylar, a trained medical scribe. The creation of this record is based on the scribe's personal observations and the provider's statements to them. This document has been checked and approved by the attending provider.

## 2017-12-03 ENCOUNTER — Telehealth: Payer: Self-pay | Admitting: *Deleted

## 2017-12-03 NOTE — Progress Notes (Signed)
  Radiation Oncology         [phone] ________________________________  Name: Elizabeth Crosby MRN: [id number]  Date: 12/04/2017  DOB: 1946/03/11  CC: [name], MD  [name], MD  HDR BRACHYTHERAPY NOTE  DIAGNOSIS: Stage IB grade 1 Endometrioid Endometrial Adenocarcinoma (MSI stable, MMR normal)   Simple treatment device note: Patient had construction of her custom vaginal cylinder. She will be treated with a 3 cm diameter segmented cylinder. This conforms to her anatomy without undue discomfort.  Vaginal brachytherapy procedure node: The patient was brought to the [facility] suite. Identity was confirmed. All relevant records and images related to the planned course of therapy were reviewed. The patient freely provided informed written consent to proceed with treatment after reviewing the details related to the planned course of therapy. The consent form was witnessed and verified by the simulation staff. Then, the patient was set-up in a stable reproducible supine position for radiation therapy. Pelvic exam revealed the vaginal cuff to be intact. The patient's custom vaginal cylinder was placed in the proximal vagina. This was affixed to the CT/MR stabilization plate to prevent slippage. Patient tolerated the placement well.  Verification simulation note:  A fiducial marker was placed within the vaginal cylinder. An AP and lateral film was then obtained through the pelvis area. This documented accurate position of the vaginal cylinder for treatment.  HDR BRACHYTHERAPY TREATMENT  The remote afterloading device was affixed to the vaginal cylinder by catheter. Patient then proceeded to undergo her second high-dose-rate treatment directed at the proximal vagina. The patient was prescribed a dose of 6.0 gray to be delivered to the mucosal surface. Treatment length was 3 cm. Patient was treated with 1 channel using 7 dwell positions. Treatment time was 291.30 seconds. Iridium 192 was the  high-dose-rate source for treatment. The patient tolerated the treatment well. After completion of her therapy, a radiation survey was performed documenting return of the iridium source into the [employer] safe.   PLAN: The patient will return next week for her third high-dose-rate treatment. ________________________________  Blair Promise, PhD, MD   This document serves as a record of services personally performed by Gery Pray, MD. It was created on his behalf by Wilburn Mylar, a trained medical scribe. The creation of this record is based on the scribe's personal observations and the provider's statements to them. This document has been checked and approved by the attending provider.

## 2017-12-03 NOTE — Telephone Encounter (Signed)
CALLED PATIENT TO REMIND OF HDR [place] 12-04-17 @ 9 AM, LVM FOR A RETURN CALL

## 2017-12-04 ENCOUNTER — Ambulatory Visit
Admission: RE | Admit: 2017-12-04 | Discharge: 2017-12-04 | Disposition: A | Discharge status: Home / Self Care | Payer: BLUE CROSS/BLUE SHIELD | Source: Ambulatory Visit | Attending: Radiation Oncology | Admitting: Radiation Oncology

## 2017-12-04 DIAGNOSIS — C541 Malignant neoplasm of endometrium: Secondary | ICD-10-CM | POA: Diagnosis not present

## 2017-12-04 DIAGNOSIS — Z510 Encounter for antineoplastic radiation therapy: Secondary | ICD-10-CM | POA: Diagnosis not present

## 2017-12-10 DIAGNOSIS — N179 Acute kidney failure, unspecified: Secondary | ICD-10-CM | POA: Diagnosis not present

## 2017-12-11 ENCOUNTER — Ambulatory Visit
Admission: RE | Admit: 2017-12-11 | Discharge: 2017-12-11 | Disposition: A | Discharge status: Home / Self Care | Payer: BLUE CROSS/BLUE SHIELD | Source: Ambulatory Visit | Attending: Radiation Oncology | Admitting: Radiation Oncology

## 2017-12-11 DIAGNOSIS — C541 Malignant neoplasm of endometrium: Secondary | ICD-10-CM | POA: Diagnosis not present

## 2017-12-11 DIAGNOSIS — Z510 Encounter for antineoplastic radiation therapy: Secondary | ICD-10-CM | POA: Diagnosis not present

## 2017-12-11 NOTE — Progress Notes (Signed)
  Radiation Oncology         [phone] ________________________________  Name: Elizabeth Crosby MRN: [id number]  Date: 12/11/2017  DOB: January 10, 1946  CC: [name], MD  [name], MD  HDR BRACHYTHERAPY NOTE  DIAGNOSIS: Stage IB grade 1 Endometrioid Endometrial Adenocarcinoma (MSI stable, MMR normal)   Simple treatment device note: Patient had construction of her custom vaginal cylinder. She will be treated with a 3 cm diameter segmented cylinder. This conforms to her anatomy without undue discomfort.  Vaginal brachytherapy procedure node: The patient was brought to the [place] suite. Identity was confirmed. All relevant records and images related to the planned course of therapy were reviewed. The patient freely provided informed written consent to proceed with treatment after reviewing the details related to the planned course of therapy. The consent form was witnessed and verified by the simulation staff. Then, the patient was set-up in a stable reproducible supine position for radiation therapy. Pelvic exam revealed the vaginal cuff to be intact. The patient's custom vaginal cylinder was placed in the proximal vagina. This was affixed to the CT/MR stabilization plate to prevent slippage. Patient tolerated the placement well.  Verification simulation note:  A fiducial marker was placed within the vaginal cylinder. An AP and lateral film was then obtained through the pelvis area. This documented accurate position of the vaginal cylinder for treatment.  HDR BRACHYTHERAPY TREATMENT  The remote afterloading device was affixed to the vaginal cylinder by catheter. Patient then proceeded to undergo her third high-dose-rate treatment directed at the proximal vagina. The patient was prescribed a dose of 6.0 gray to be delivered to the mucosal surface. Treatment length was 3 cm. Patient was treated with 1 channel using 7 dwell positions. Treatment time was 311.10 seconds. Iridium 192 was the  high-dose-rate source for treatment. The patient tolerated the treatment well. After completion of her therapy, a radiation survey was performed documenting return of the iridium source into the Gamma[employer] safe.   PLAN: The patient will return next week for her fourth high-dose-rate treatment. ________________________________  Blair Promise, PhD, MD   This document serves as a record of services personally performed by Gery Pray, MD. It was created on his behalf by Wilburn Mylar, a trained medical scribe. The creation of this record is based on the scribe's personal observations and the provider's statements to them. This document has been checked and approved by the attending provider.

## 2017-12-18 ENCOUNTER — Ambulatory Visit
Admission: RE | Admit: 2017-12-18 | Discharge: 2017-12-18 | Disposition: A | Discharge status: Home / Self Care | Payer: BLUE CROSS/BLUE SHIELD | Source: Ambulatory Visit | Attending: Radiation Oncology | Admitting: Radiation Oncology

## 2017-12-18 DIAGNOSIS — C541 Malignant neoplasm of endometrium: Secondary | ICD-10-CM | POA: Diagnosis not present

## 2017-12-18 DIAGNOSIS — Z510 Encounter for antineoplastic radiation therapy: Secondary | ICD-10-CM | POA: Diagnosis not present

## 2017-12-18 MED ORDER — PROCHLORPERAZINE MALEATE 10 MG PO TABS: 10.0000 mg | ORAL_TABLET | Freq: Four times a day (QID) | ORAL | 0 refills | Status: DC | PRN

## 2017-12-18 NOTE — Progress Notes (Signed)
  Radiation Oncology         [phone] ________________________________  Name: Elizabeth Crosby MRN: [id number]  Date: 12/18/2017  DOB: [birth year]  CC: [name], MD  [name], MD  HDR BRACHYTHERAPY NOTE  DIAGNOSIS: Stage IB grade 1 Endometrioid Endometrial Adenocarcinoma (MSI stable, MMR normal)   Simple treatment device note: Patient had construction of her custom vaginal cylinder. She will be treated with a 3 cm diameter segmented cylinder. This conforms to her anatomy without undue discomfort.  Vaginal brachytherapy procedure node: The patient was brought to the [place] suite. Identity was confirmed. All relevant records and images related to the planned course of therapy were reviewed. The patient freely provided informed written consent to proceed with treatment after reviewing the details related to the planned course of therapy. The consent form was witnessed and verified by the simulation staff. Then, the patient was set-up in a stable reproducible supine position for radiation therapy. Pelvic exam revealed the vaginal cuff to be intact. The patient's custom vaginal cylinder was placed in the proximal vagina. This was affixed to the CT/MR stabilization plate to prevent slippage. Patient tolerated the placement well.  Verification simulation note:  A fiducial marker was placed within the vaginal cylinder. An AP and lateral film was then obtained through the pelvis area. This documented accurate position of the vaginal cylinder for treatment.  HDR BRACHYTHERAPY TREATMENT  The remote afterloading device was affixed to the vaginal cylinder by catheter. Patient then proceeded to undergo her fourth high-dose-rate treatment directed at the proximal vagina. The patient was prescribed a dose of 6.0 gray to be delivered to the mucosal surface. Treatment length was 3 cm. Patient was treated with 1 channel using 7 dwell positions. Treatment time was 332.20 seconds. Total [place] seconds was  1549.71. Iridium 192 was the high-dose-rate source for treatment. The patient tolerated the treatment well. After completion of her therapy, a radiation survey was performed documenting return of the iridium source into the Gamma[employer] safe.   PLAN: The patient will return next week for her fifth and final high-dose-rate treatment. ________________________________  Blair Promise, PhD, MD   This document serves as a record of services personally performed by Gery Pray, MD. It was created on his behalf by Wilburn Mylar, a trained medical scribe. The creation of this record is based on the scribe's personal observations and the provider's statements to them. This document has been checked and approved by the attending provider.

## 2017-12-23 ENCOUNTER — Telehealth: Payer: Self-pay

## 2017-12-24 ENCOUNTER — Telehealth: Payer: Self-pay | Admitting: *Deleted

## 2017-12-24 NOTE — Telephone Encounter (Signed)
error 

## 2017-12-24 NOTE — Telephone Encounter (Signed)
CALLED PATIENT TO REMIND OF [facility] [place] 12-25-17 @ 2:30 PM, SPOKE WITH PATIENT'S HUSBAND AND HE IS AWARE OF THIS TX.

## 2017-12-25 ENCOUNTER — Encounter: Payer: Self-pay | Admitting: Radiation Oncology

## 2017-12-25 ENCOUNTER — Ambulatory Visit
Admission: RE | Admit: 2017-12-25 | Discharge: 2017-12-25 | Disposition: A | Discharge status: Home / Self Care | Payer: BLUE CROSS/BLUE SHIELD | Source: Ambulatory Visit | Attending: Radiation Oncology | Admitting: Radiation Oncology

## 2017-12-25 DIAGNOSIS — C541 Malignant neoplasm of endometrium: Secondary | ICD-10-CM | POA: Diagnosis not present

## 2017-12-25 DIAGNOSIS — Z510 Encounter for antineoplastic radiation therapy: Secondary | ICD-10-CM | POA: Diagnosis not present

## 2017-12-25 NOTE — Progress Notes (Signed)
  Radiation Oncology         [phone] ________________________________  Name: Elizabeth Crosby MRN: [id number]  Date: 12/25/2017  DOB: October 22, 1945  CC: [name], MD  [name], MD  HDR BRACHYTHERAPY NOTE  DIAGNOSIS: 72 y.o. female with Stage IB grade 1 Endometrioid Endometrial Adenocarcinoma (MSI stable, MMR normal)   Simple treatment device note: Patient had construction of her custom vaginal cylinder. She will be treated with a 3 cm diameter segmented cylinder. This conforms to her anatomy without undue discomfort.  Vaginal brachytherapy procedure node: The patient was brought to the [place] suite. Identity was confirmed. All relevant records and images related to the planned course of therapy were reviewed. The patient freely provided informed written consent to proceed with treatment after reviewing the details related to the planned course of therapy. The consent form was witnessed and verified by the simulation staff. Then, the patient was set-up in a stable reproducible supine position for radiation therapy. Pelvic exam revealed the vaginal cuff to be intact. The patient's custom vaginal cylinder was placed in the proximal vagina. This was affixed to the CT/MR stabilization plate to prevent slippage. Patient tolerated the placement well.  Verification simulation note:  A fiducial marker was placed within the vaginal cylinder. An AP and lateral film was then obtained through the pelvis area. This documented accurate position of the vaginal cylinder for treatment.  HDR BRACHYTHERAPY TREATMENT  The remote afterloading device was affixed to the vaginal cylinder by catheter. Patient then proceeded to undergo her fifth and final high-dose-rate treatment directed at the proximal vagina. The patient was prescribed a dose of 6.0 gray to be delivered to the mucosal surface. Treatment length was 3 cm. Patient was treated with 1 channel using 7 dwell positions. Treatment time was 355.0 seconds.  Iridium 192 was the high-dose-rate source for treatment. The patient tolerated the treatment well. After completion of her therapy, a radiation survey was performed documenting return of the iridium source into the Gamma[employer] safe.   PLAN: The patient will return in one month for follow-up. ________________________________   Blair Promise, PhD, MD  This document serves as a record of services personally performed by Gery Pray, MD. It was created on his behalf by Rae Lips, a trained medical scribe. The creation of this record is based on the scribe's personal observations and the provider's statements to them. This document has been checked and approved by the attending provider.

## 2017-12-25 NOTE — Progress Notes (Signed)
  Radiation Oncology         [phone] ________________________________  Name: Elizabeth Crosby MRN: [id number]  Date: 12/25/2017  DOB: [birth year]  End of Treatment Note  Diagnosis:   72 y.o. female with Stage IB grade 1 Endometrioid Endometrial Adenocarcinoma (MSI stable, MMR normal)     Indication for treatment:  Curative       Radiation treatment dates:   11/27/2017, 12/04/2017, 12/11/2017, 12/18/2017, 12/25/2017  Site/dose:   Vaginal Cuff / 30 Gy in 5 fractions  Beams/energy:   HDR Ir-192 Vaginal Brachytherapy, 3.0 cm cylinder, 3.0 cm tx length  Narrative: The patient tolerated radiation treatment relatively well.   No significant issues or effects.  Plan: The patient has completed radiation treatment. The patient will return to radiation oncology clinic for routine followup in one month. I advised them to call or return sooner if they have any questions or concerns related to their recovery or treatment.  -----------------------------------  Blair Promise, PhD, MD  This document serves as a record of services personally performed by Gery Pray, MD. It was created on his behalf by Rae Lips, a trained medical scribe. The creation of this record is based on the scribe's personal observations and the provider's statements to them. This document has been checked and approved by the attending provider.

## 2017-12-26 DIAGNOSIS — N179 Acute kidney failure, unspecified: Secondary | ICD-10-CM | POA: Diagnosis not present

## 2018-01-01 DIAGNOSIS — R350 Frequency of micturition: Secondary | ICD-10-CM | POA: Diagnosis not present

## 2018-01-01 DIAGNOSIS — R609 Edema, unspecified: Secondary | ICD-10-CM | POA: Diagnosis not present

## 2018-01-01 DIAGNOSIS — Z23 Encounter for immunization: Secondary | ICD-10-CM | POA: Diagnosis not present

## 2018-01-22 ENCOUNTER — Ambulatory Visit: Payer: BLUE CROSS/BLUE SHIELD | Admitting: Radiation Oncology

## 2018-01-29 ENCOUNTER — Other Ambulatory Visit: Payer: Self-pay

## 2018-01-29 ENCOUNTER — Ambulatory Visit
Admission: RE | Admit: 2018-01-29 | Discharge: 2018-01-29 | Disposition: A | Discharge status: Home / Self Care | Payer: BLUE CROSS/BLUE SHIELD | Source: Ambulatory Visit | Attending: Radiation Oncology | Admitting: Radiation Oncology

## 2018-01-29 ENCOUNTER — Encounter: Payer: Self-pay | Admitting: Radiation Oncology

## 2018-01-29 VITALS — BP 155/45 | HR 85 | Temp 97.8°F | Resp 20 | Ht 61.5 in | Wt 238.1 lb

## 2018-01-29 DIAGNOSIS — Z79899 Other long term (current) drug therapy: Secondary | ICD-10-CM | POA: Insufficient documentation

## 2018-01-29 DIAGNOSIS — C541 Malignant neoplasm of endometrium: Secondary | ICD-10-CM | POA: Insufficient documentation

## 2018-01-29 DIAGNOSIS — Z08 Encounter for follow-up examination after completed treatment for malignant neoplasm: Secondary | ICD-10-CM | POA: Diagnosis not present

## 2018-01-29 DIAGNOSIS — Z7982 Long term (current) use of aspirin: Secondary | ICD-10-CM | POA: Insufficient documentation

## 2018-01-29 DIAGNOSIS — Z882 Allergy status to sulfonamides status: Secondary | ICD-10-CM | POA: Diagnosis not present

## 2018-01-29 DIAGNOSIS — Z794 Long term (current) use of insulin: Secondary | ICD-10-CM | POA: Insufficient documentation

## 2018-01-29 NOTE — Progress Notes (Signed)
Radiation Oncology         (336) [phone] ________________________________  Name: Elizabeth Crosby MRN: [id number]  Date: 01/29/2018  DOB: Mar 21, 1946  Follow-Up Visit Note  CC: [name], MD  [name], MD    ICD-10-CM   1. Endometrial cancer ([place]) C54.1     Diagnosis:   72 y.o. female with Stage IB grade 1 Endometrioid Endometrial Adenocarcinoma (MSI stable, MMR normal)   Interval Since Last Radiation:  5 weeks  Radiation treatment dates:   11/27/2017, 12/04/2017, 12/11/2017, 12/18/2017, 12/25/2017 Site/dose:   Vaginal Cuff / 30 Gy in 5 fractions  Narrative:  The patient returns today for routine follow-up.  She is doing well overall. She reports her energy level is improving. She denies dysuria or hematuria. She denies any vaginal bleeding or discharge. She denies rectal bleeding or constipation. She reports occasional diarrhea that is relieved by Imodium.                        ALLERGIES:  is allergic to lipitor [atorvastatin]; other; sulfa antibiotics; and potassium-containing compounds.  Meds: Current Outpatient Medications  Medication Sig Dispense Refill  . acetaminophen (TYLENOL) 500 MG tablet Take 2 tablets (1,000 mg total) by mouth every 12 (twelve) hours. 30 tablet 0  . amLODipine (NORVASC) 5 MG tablet Take 5 mg by mouth every morning.     Marland Kitchen aspirin EC 81 MG tablet Take 81 mg by mouth every morning.    . Carboxymethylcellul-Glycerin (LUBRICATING EYE DROPS OP) Place 1 drop into both eyes daily as needed (irritation).    . cetirizine (ZYRTEC) 10 MG tablet Take 10 mg by mouth daily.     . fenofibrate 160 MG tablet Take 160 mg by mouth every morning.     . hydrocortisone cream 1 % Apply 1 application topically daily as needed for itching.    Marland Kitchen ibuprofen (ADVIL,MOTRIN) 600 MG tablet Take 1 tablet (600 mg total) by mouth every 6 (six) hours. 30 tablet 0  . insulin aspart (NOVOLOG) 100 UNIT/ML injection Inject 10-20 Units into the skin See admin instructions. Inject 10  units at breakfast and lunch and 20 units with supper    . Insulin Degludec (TRESIBA FLEXTOUCH) 200 UNIT/ML SOPN Inject 90 Units into the skin daily with breakfast. On a normal day I take 90 units, if it gets in the 70s , I take 5 or 6 units less than the normal    . loperamide (IMODIUM A-D) 2 MG tablet Take 4 mg by mouth as needed for diarrhea or loose stools.    . Melatonin 5 MG TABS Take 5-10 mg by mouth at bedtime as needed (sleep).    . metFORMIN (GLUCOPHAGE) 850 MG tablet Take 850 mg by mouth every evening.     . prochlorperazine (COMPAZINE) 10 MG tablet Take 1 tablet (10 mg total) by mouth every 6 (six) hours as needed for nausea or vomiting. 30 tablet 0  . senna-docusate (SENOKOT-S) 8.6-50 MG tablet Take 2 tablets by mouth at bedtime. 30 tablet 0  . simvastatin (ZOCOR) 20 MG tablet Take 20 mg by mouth every evening.    Marland Kitchen telmisartan-hydrochlorothiazide (MICARDIS HCT) 80-25 MG tablet Take 1 tablet by mouth daily.  0  . traMADol (ULTRAM) 50 MG tablet Take 25-50 mg by mouth 2 (two) times daily as needed for severe pain.    Marland Kitchen losartan-hydrochlorothiazide (HYZAAR) 100-25 MG per tablet Take 1 tablet by mouth daily.      No current facility-administered  medications for this encounter.     Physical Findings: The patient is in no acute distress. Patient is alert and oriented.  height is 5' 1.5" (1.562 m) and weight is 238 lb 2 oz (108 kg). Her oral temperature is 97.8 F (36.6 C). Her blood pressure is 155/45 (abnormal) and her pulse is 85. Her respiration is 20 and oxygen saturation is 98%.   Lungs are clear to auscultation bilaterally. Heart has regular rate and rhythm. No palpable cervical, supraclavicular, or axillary adenopathy. Abdomen soft, non-tender, normal bowel sounds.  Pelvic exam deferred today in light of recent completion of treatment.  Lab Findings: Lab Results  Component Value Date   WBC 10.6 (H) 10/01/2017   HGB 10.3 (L) 10/01/2017   HCT 31.3 (L) 10/01/2017   MCV 88.2  10/01/2017   PLT 185 10/01/2017    Radiographic Findings: No results found.  Impression:  She tolerated her vaginal brachytherapy well without any side effects.Vaginal dilator given today with instructions on its use.  Plan:  Patient will follow up with Dr. [name] in 2 months. Follow up in radiation oncology in 5 months.   ____________________________________  Blair Promise, PhD, MD  This document serves as a record of services personally performed by Gery Pray, MD. It was created on his behalf by Rae Lips, a trained medical scribe. The creation of this record is based on the scribe's personal observations and the provider's statements to them. This document has been checked and approved by the attending provider.

## 2018-01-29 NOTE — Progress Notes (Signed)
Pt presents today for one month f/u appt with Dr. Sondra Come. Pt is unaccompanied. Pt is in good spirits. Pt reports fatigue is improving. Pt denies dysuria/hematuria. Pt denies vaginal bleeding/discharge. Pt denies rectal bleeding and constipation. Pt reports occasional diarrhea that is relieved by Imodium.   Vaginal dilator teaching given with good understanding. Loma Sousa, RN BSN   Wt Readings from Last 3 Encounters:  01/29/18 238 lb 2 oz (108 kg)  11/27/17 234 lb 9.6 oz (106.4 kg)  11/03/17 228 lb 1.9 oz (103.5 kg)   BP (!) 155/45 (BP Location: Left Arm, Patient Position: Sitting)   Pulse 85   Temp 97.8 F (36.6 C) (Oral)   Resp 20   Ht 5' 1.5" (1.562 m)   Wt 238 lb 2 oz (108 kg)   SpO2 98%   BMI 44.26 kg/m

## 2018-02-05 DIAGNOSIS — M25562 Pain in left knee: Secondary | ICD-10-CM | POA: Diagnosis not present

## 2018-02-05 DIAGNOSIS — M25561 Pain in right knee: Secondary | ICD-10-CM | POA: Diagnosis not present

## 2018-02-05 DIAGNOSIS — M170 Bilateral primary osteoarthritis of knee: Secondary | ICD-10-CM | POA: Diagnosis not present

## 2018-02-25 DIAGNOSIS — N183 Chronic kidney disease, stage 3 (moderate): Secondary | ICD-10-CM | POA: Diagnosis not present

## 2018-02-25 DIAGNOSIS — R39198 Other difficulties with micturition: Secondary | ICD-10-CM | POA: Diagnosis not present

## 2018-02-25 DIAGNOSIS — I129 Hypertensive chronic kidney disease with stage 1 through stage 4 chronic kidney disease, or unspecified chronic kidney disease: Secondary | ICD-10-CM | POA: Diagnosis not present

## 2018-02-25 DIAGNOSIS — E782 Mixed hyperlipidemia: Secondary | ICD-10-CM | POA: Diagnosis not present

## 2018-02-25 DIAGNOSIS — E1122 Type 2 diabetes mellitus with diabetic chronic kidney disease: Secondary | ICD-10-CM | POA: Diagnosis not present

## 2018-03-01 DIAGNOSIS — S064X0A Epidural hemorrhage without loss of consciousness, initial encounter: Secondary | ICD-10-CM | POA: Diagnosis not present

## 2018-03-01 DIAGNOSIS — W19XXXA Unspecified fall, initial encounter: Secondary | ICD-10-CM | POA: Diagnosis not present

## 2018-03-01 DIAGNOSIS — R5381 Other malaise: Secondary | ICD-10-CM | POA: Diagnosis not present

## 2018-03-04 ENCOUNTER — Telehealth: Payer: Self-pay | Admitting: Oncology

## 2018-03-04 ENCOUNTER — Other Ambulatory Visit: Payer: Self-pay | Admitting: Family Medicine

## 2018-03-04 DIAGNOSIS — G629 Polyneuropathy, unspecified: Secondary | ICD-10-CM

## 2018-03-04 NOTE — Telephone Encounter (Signed)
Elizabeth Crosby called and said she is having trouble using the dilator.  Discussed options for use and also a referral for pelvic floor physical therapy.  She said she would be interested in the referral.

## 2018-03-05 ENCOUNTER — Telehealth: Payer: Self-pay | Admitting: Oncology

## 2018-03-05 NOTE — Telephone Encounter (Signed)
Elizabeth Crosby and discussed that Dr. Sondra Come does not think pelvic floor physcial therapy would be helpful and that the she will be assessed at her next exam to see if she does need to use the dilator. Also advised her of appointments with Dr. [name] on 04/03/18 and Dr. Sondra Come on 06/29/18.  She verbalized understanding and agreement.

## 2018-03-16 ENCOUNTER — Ambulatory Visit
Admission: RE | Admit: 2018-03-16 | Discharge: 2018-03-16 | Disposition: A | Discharge status: Home / Self Care | Payer: BLUE CROSS/BLUE SHIELD | Source: Ambulatory Visit | Attending: Family Medicine | Admitting: Family Medicine

## 2018-03-16 DIAGNOSIS — G629 Polyneuropathy, unspecified: Secondary | ICD-10-CM

## 2018-03-16 DIAGNOSIS — M170 Bilateral primary osteoarthritis of knee: Secondary | ICD-10-CM | POA: Diagnosis not present

## 2018-03-16 DIAGNOSIS — M48061 Spinal stenosis, lumbar region without neurogenic claudication: Secondary | ICD-10-CM | POA: Diagnosis not present

## 2018-03-23 DIAGNOSIS — M170 Bilateral primary osteoarthritis of knee: Secondary | ICD-10-CM | POA: Diagnosis not present

## 2018-03-30 DIAGNOSIS — Z6841 Body Mass Index (BMI) 40.0 and over, adult: Secondary | ICD-10-CM | POA: Diagnosis not present

## 2018-03-30 DIAGNOSIS — M170 Bilateral primary osteoarthritis of knee: Secondary | ICD-10-CM | POA: Diagnosis not present

## 2018-03-30 DIAGNOSIS — M47816 Spondylosis without myelopathy or radiculopathy, lumbar region: Secondary | ICD-10-CM | POA: Diagnosis not present

## 2018-03-30 DIAGNOSIS — M545 Low back pain: Secondary | ICD-10-CM | POA: Diagnosis not present

## 2018-03-30 DIAGNOSIS — G8929 Other chronic pain: Secondary | ICD-10-CM | POA: Diagnosis not present

## 2018-04-03 ENCOUNTER — Encounter: Payer: Self-pay | Admitting: Gynecologic Oncology

## 2018-04-03 ENCOUNTER — Inpatient Hospital Stay: Payer: BLUE CROSS/BLUE SHIELD | Attending: Gynecologic Oncology | Admitting: Gynecologic Oncology

## 2018-04-03 VITALS — BP 128/68 | HR 88 | Temp 97.8°F | Resp 18 | Ht 61.5 in | Wt 228.0 lb

## 2018-04-03 DIAGNOSIS — C541 Malignant neoplasm of endometrium: Secondary | ICD-10-CM | POA: Insufficient documentation

## 2018-04-03 DIAGNOSIS — Z90710 Acquired absence of both cervix and uterus: Secondary | ICD-10-CM

## 2018-04-03 DIAGNOSIS — Z90722 Acquired absence of ovaries, bilateral: Secondary | ICD-10-CM | POA: Insufficient documentation

## 2018-04-03 DIAGNOSIS — Z923 Personal history of irradiation: Secondary | ICD-10-CM

## 2018-04-03 NOTE — Progress Notes (Signed)
Follow-up Note: Gyn-Onc  Consult was initially requested by Dr. Simona Huh for the evaluation of Elizabeth Crosby 73 y.o. female  CC:  Chief Complaint  Patient presents with  . history of endometrial cancer    Assessment/Plan:  [name]  is a 73 y.o.  year old a stage IB grade 1endometrioid endometrial adenocarcinoma (MSI stable, MMR normal).  Pathology revealed high/intermediate risk factors for recurrence, therefore vaginal brachytherapy adjuvant therapy was recommended according to NCCN guidelines. It was completed in September, 2019.  I discussed risk for recurrence and typical symptoms encouraged her to notify us of these should they develop between visits.  I recommend she have follow-up every 6 months for 5 years in accordance with NCCN guidelines. Those visits should include symptom assessment, physical exam and pelvic examination. Pap smears are not indicated or recommended in the routine surveillance of endometrial cancer  HPI: [name] is a 73 year old P2 who is seen in consultation at the request of Dr Simona Huh for a right adnexal mass, elevated CA 125, postmenopausal bleeding, AGUS pap.  The patient is morbidly obese and has a history of 2 prior vaginal deliveries remotely. She is postmenopausal for approximately 20 years. She has always had normal paps (though last pap was more than 5 years ago).  In May, 2019 she developed vaginal bleeding. She was seen by Dr Simona Huh on 09/03/17 and a TVUS was performed which revealed a uterus measuring 7.1 x 4.4x3.1cm with a thickened endometrium on 2cm. The endometrium was heterogeneous and had internal areas of associated vascularity.  The right ovary was not well delineated and contacted a complex cystic lesion measuring 4x3.7x3.3cm. There were a few scattered internal septations without appreciable vascularity.   Pap smear from 09/03/17 showed AGUS with HPV not detected.  CA 125 on 6/4//19 was 74.1  Endometrial biopsy on  09/03/17 showed fragments of benign endometrial tissue with eosinophilic change and papillary metaplasia and no atypia, hyperplasia or malignancy.    She reports a history of morbid obesity, hypertension (with associated LVH, last echo in 2015 showed EF of 60%). She has hypertensive nephropathy with stage 3 CKD. She hs severe lower extremity arthritis. She cannot walk up a flight of stairs due to knee pain and shortness of breath. She has no chest pain.  She has had an open cholecystectomy and appendectomy.  She takes baby ASA daily.   Her father and brother both died from pancreatic cancer. Her maternal aunt has a history of breast cancer.   On 09/30/17 she underwent robotic assisted total hysterectomy, BSO. Staging was not performed due to extreme morbid obesity.  Intraoperative frozen section of the ovary revealed a benign ovarian cyst and low grade endometrial cancer.   Final pathology revealed a benign ovarian cyst, however the endometrium contained a 4.5cm grade 1 endometrioid tumor with 16 of 57m (outer half) myometrial invasion. There was no LVSI. The tumor was MMR normal on IHC and MSI stable.  Due to her age (>70), this single high risk feature of deep myo invasion meant that she met high/intermediate risk factor criteria, and therefore adjuvant therapy with vaginal brachytherapy was recommended in accordance with NCCN guidelines.   She received vaginal cuff brachytherapy with 5 fractions of a total of 30Gy delivered between 11/27/17 and 12/25/17.   Interval Hx: Since had SOB on exertion after surgery and was seen by her cardiologist, Dr PDorris Carneswho felt this was not cardiac related (normal cardiac testing).  She has issues  with arthritis and is unable to use the dilator due to physical limitations.   Current Meds:  Outpatient Encounter Medications as of 04/03/2018  Medication Sig  . acetaminophen (TYLENOL) 500 MG tablet Take 2 tablets (1,000 mg total) by mouth every 12 (twelve)  hours.  Marland Kitchen amLODipine (NORVASC) 5 MG tablet Take 5 mg by mouth every morning.   Marland Kitchen aspirin EC 81 MG tablet Take 81 mg by mouth every morning.  . Carboxymethylcellul-Glycerin (LUBRICATING EYE DROPS OP) Place 1 drop into both eyes daily as needed (irritation).  . cetirizine (ZYRTEC) 10 MG tablet Take 10 mg by mouth daily.   . fenofibrate 160 MG tablet Take 160 mg by mouth every morning.   . hydrocortisone cream 1 % Apply 1 application topically daily as needed for itching.  Marland Kitchen ibuprofen (ADVIL,MOTRIN) 600 MG tablet Take 1 tablet (600 mg total) by mouth every 6 (six) hours.  . insulin aspart (NOVOLOG) 100 UNIT/ML injection Inject 10-20 Units into the skin See admin instructions. Inject 10 units at breakfast and lunch and 20 units with supper  . Insulin Degludec (TRESIBA FLEXTOUCH) 200 UNIT/ML SOPN Inject 90 Units into the skin daily with breakfast. On a normal day I take 90 units, if it gets in the 70s , I take 5 or 6 units less than the normal  . loperamide (IMODIUM A-D) 2 MG tablet Take 4 mg by mouth as needed for diarrhea or loose stools.  Marland Kitchen losartan-hydrochlorothiazide (HYZAAR) 100-25 MG per tablet Take 1 tablet by mouth daily.   . Melatonin 5 MG TABS Take 5-10 mg by mouth at bedtime as needed (sleep).  . metFORMIN (GLUCOPHAGE) 850 MG tablet Take 850 mg by mouth every evening.   . prochlorperazine (COMPAZINE) 10 MG tablet Take 1 tablet (10 mg total) by mouth every 6 (six) hours as needed for nausea or vomiting.  . senna-docusate (SENOKOT-S) 8.6-50 MG tablet Take 2 tablets by mouth at bedtime.  . simvastatin (ZOCOR) 20 MG tablet Take 20 mg by mouth every evening.  Marland Kitchen telmisartan-hydrochlorothiazide (MICARDIS HCT) 80-25 MG tablet Take 1 tablet by mouth daily.  . traMADol (ULTRAM) 50 MG tablet Take 25-50 mg by mouth 2 (two) times daily as needed for severe pain.   No facility-administered encounter medications on file as of 04/03/2018.     Allergy:  Allergies  Allergen Reactions  . Lipitor  [Atorvastatin] Other (See Comments)    Leg cramps  . Other Nausea And Vomiting    Pt states does not know drug but anesthesia drugs have made her have nausea and vomiting.   . Sulfa Antibiotics     diarrhea  . Potassium-Containing Compounds Rash    Social Hx:   Social History   Socioeconomic History  . Marital status: Married    Spouse name: Not on file  . Number of children: Not on file  . Years of education: Not on file  . Highest education level: Not on file  Occupational History  . Not on file  Social Needs  . Financial resource strain: Not on file  . Food insecurity:    Worry: Not on file    Inability: Not on file  . Transportation needs:    Medical: Not on file    Non-medical: Not on file  Tobacco Use  . Smoking status: Never Smoker  . Smokeless tobacco: Never Used  Substance and Sexual Activity  . Alcohol use: No  . Drug use: No  . Sexual activity: Yes  Lifestyle  . Physical  activity:    Days per week: Not on file    Minutes per session: Not on file  . Stress: Not on file  Relationships  . Social connections:    Talks on phone: Not on file    Gets together: Not on file    Attends religious service: Not on file    Active member of club or organization: Not on file    Attends meetings of clubs or organizations: Not on file    Relationship status: Not on file  . Intimate partner violence:    Fear of current or ex partner: Not on file    Emotionally abused: Not on file    Physically abused: Not on file    Forced sexual activity: Not on file  Other Topics Concern  . Not on file  Social History Narrative  . Not on file    Past Surgical Hx:  Past Surgical History:  Procedure Laterality Date  . APPENDECTOMY  1972  . CATARACT EXTRACTION W/PHACO Right 01/10/2014   Procedure: CATARACT EXTRACTION PHACO AND INTRAOCULAR LENS PLACEMENT ([place]);  Surgeon: Tonny Branch, MD;  Location: [facility];  Service: Ophthalmology;  Laterality: Right;  CDE:5.78  . CATARACT  EXTRACTION W/PHACO Left 01/27/2014   Procedure: CATARACT EXTRACTION PHACO AND INTRAOCULAR LENS PLACEMENT LEFT EYE CDE=5.75;  Surgeon: Tonny Branch, MD;  Location: [facility];  Service: Ophthalmology;  Laterality: Left;  . CHOLECYSTECTOMY  1972  . COLONOSCOPY WITH PROPOFOL N/A 09/01/2012   Procedure: COLONOSCOPY WITH PROPOFOL;  Surgeon: Garlan Fair, MD;  Location: [facility];  Service: Endoscopy;  Laterality: N/A;  . KIDNEY STONE SURGERY    . OMENTECTOMY  09/30/2017   Procedure: OMENTECTOMY;  Surgeon: [name], MD;  Location: [facility];  Service: Gynecology;;  . ROBOTIC ASSISTED TOTAL HYSTERECTOMY WITH BILATERAL SALPINGO OOPHERECTOMY Bilateral 09/30/2017   Procedure: ROBOTIC ASSISTED TOTAL HYSTERECTOMY WITH BILATERAL SALPINGO OOPHORECTOMY;  Surgeon: [name], MD;  Location: [facility];  Service: Gynecology;  Laterality: Bilateral;    Past Medical Hx:  Past Medical History:  Diagnosis Date  . Anxiety   . Arthritis    "knees are bone on bone and i see dr. Tonita Cong for the cortisone injections "   . Cellulitis    hx  of in the legs   . Chronic kidney disease (CKD), stage III (moderate) (HCC)   . Complication of anesthesia    doesnt know the name of the anesthesia , but i was vomiting while i was still under   . Diabetes mellitus without complication ([place])    type 2   . GERD (gastroesophageal reflux disease)   . Glaucoma   . Gout   . History of blood transfusion    as child after accident, after childbirth/hemorroid surgery  . History of kidney stones   . Hyperlipidemia   . Hypertension   . Leukocytes in urine   . Lumbar back pain   . Sleep apnea    non adherant with cpap device     Past Gynecological History:  SVD x 2 No LMP recorded. Patient is postmenopausal.  Family Hx:  Family History  Problem Relation Age of Onset  . Stroke Mother   . Asthma Father   . Colon cancer Father     Review of Systems:  Constitutional  Feels well,    ENT Normal appearing ears and nares  bilaterally Skin/Breast  No rash, sores, jaundice, itching, dryness Cardiovascular  No chest pain, shortness of breath, or edema  Pulmonary  +  SOB on exertion Gastro Intestinal  No nausea, vomitting, or diarrhoea. No bright red blood per rectum, no abdominal pain, change in bowel movement, or constipation.  Genito Urinary  No frequency, urgency, dysuria, no bleeding Musculo Skeletal  + lower extremity joint pains Neurologic   No weakness, numbness, change in gait,  Psychology  No depression, anxiety, insomnia.   Vitals:  Blood pressure 128/68, pulse 88, temperature 97.8 F (36.6 C), temperature source Oral, resp. rate 18, height 5' 1.5" (1.562 m), weight 228 lb (103.4 kg), SpO2 98 %.  Physical Exam: WD in NAD Neck  Supple NROM, without any enlargements.  Lymph Node Survey No cervical supraclavicular or inguinal adenopathy Cardiovascular  Pulse normal rate, regularity and rhythm. S1 and S2 normal.  Lungs  Clear to auscultation bilateraly, without wheezes/crackles/rhonchi. Good air movement.  Skin  No rash/lesions/breakdown  Psychiatry  Alert and oriented to person, place, and time  Abdomen  Normoactive bowel sounds, abdomen soft, non-tender and obese without evidence of hernia. Incisions with some crusting but no infection or cellulitis or drainage.  Back No CVA tenderness Genito Urinary  Vaginal cuff smooth and normal, no lesions or masses, in tact, no blood.  Rectal  deferred Extremities  No bilateral cyanosis, clubbing or edema.   Thereasa Solo, MD  04/03/2018, 12:10 PM

## 2018-04-03 NOTE — Patient Instructions (Signed)
Please notify Dr [name] at phone number [phone] if you notice vaginal bleeding, new pelvic or abdominal pains, bloating, feeling full easy, or a change in bladder or bowel function.   Please return to see Dr [name] in June, 2020.

## 2018-05-18 DIAGNOSIS — J069 Acute upper respiratory infection, unspecified: Secondary | ICD-10-CM | POA: Diagnosis not present

## 2018-05-18 DIAGNOSIS — R0989 Other specified symptoms and signs involving the circulatory and respiratory systems: Secondary | ICD-10-CM | POA: Diagnosis not present

## 2018-05-20 DIAGNOSIS — L02419 Cutaneous abscess of limb, unspecified: Secondary | ICD-10-CM | POA: Diagnosis not present

## 2018-05-20 DIAGNOSIS — B353 Tinea pedis: Secondary | ICD-10-CM | POA: Diagnosis not present

## 2018-05-28 DIAGNOSIS — Z6841 Body Mass Index (BMI) 40.0 and over, adult: Secondary | ICD-10-CM | POA: Diagnosis not present

## 2018-05-28 DIAGNOSIS — Z794 Long term (current) use of insulin: Secondary | ICD-10-CM | POA: Diagnosis not present

## 2018-05-28 DIAGNOSIS — N183 Chronic kidney disease, stage 3 (moderate): Secondary | ICD-10-CM | POA: Diagnosis not present

## 2018-05-28 DIAGNOSIS — C541 Malignant neoplasm of endometrium: Secondary | ICD-10-CM | POA: Diagnosis not present

## 2018-05-28 DIAGNOSIS — I129 Hypertensive chronic kidney disease with stage 1 through stage 4 chronic kidney disease, or unspecified chronic kidney disease: Secondary | ICD-10-CM | POA: Diagnosis not present

## 2018-05-28 DIAGNOSIS — M545 Low back pain: Secondary | ICD-10-CM | POA: Diagnosis not present

## 2018-05-28 DIAGNOSIS — E782 Mixed hyperlipidemia: Secondary | ICD-10-CM | POA: Diagnosis not present

## 2018-05-28 DIAGNOSIS — E1122 Type 2 diabetes mellitus with diabetic chronic kidney disease: Secondary | ICD-10-CM | POA: Diagnosis not present

## 2018-05-28 DIAGNOSIS — G629 Polyneuropathy, unspecified: Secondary | ICD-10-CM | POA: Diagnosis not present

## 2018-05-28 DIAGNOSIS — L0390 Cellulitis, unspecified: Secondary | ICD-10-CM | POA: Diagnosis not present

## 2018-05-29 DIAGNOSIS — E1122 Type 2 diabetes mellitus with diabetic chronic kidney disease: Secondary | ICD-10-CM | POA: Diagnosis not present

## 2018-06-29 ENCOUNTER — Ambulatory Visit: Payer: Self-pay | Admitting: Radiation Oncology

## 2018-06-30 ENCOUNTER — Telehealth: Payer: Self-pay | Admitting: Radiation Oncology

## 2018-06-30 NOTE — Telephone Encounter (Signed)
New message:     Canceled patients appt per her request.

## 2018-07-02 ENCOUNTER — Ambulatory Visit: Payer: BLUE CROSS/BLUE SHIELD | Admitting: Radiation Oncology

## 2018-08-26 DIAGNOSIS — Z0000 Encounter for general adult medical examination without abnormal findings: Secondary | ICD-10-CM | POA: Diagnosis not present

## 2018-09-09 ENCOUNTER — Ambulatory Visit: Payer: Self-pay | Admitting: Gynecologic Oncology

## 2018-09-25 ENCOUNTER — Encounter: Payer: Self-pay | Admitting: Gynecologic Oncology

## 2018-09-25 ENCOUNTER — Telehealth: Payer: Self-pay | Admitting: *Deleted

## 2018-09-25 ENCOUNTER — Other Ambulatory Visit: Payer: Self-pay

## 2018-09-25 ENCOUNTER — Inpatient Hospital Stay: Payer: BC Managed Care – PPO | Attending: Gynecologic Oncology | Admitting: Gynecologic Oncology

## 2018-09-25 VITALS — BP 132/43 | HR 80 | Temp 98.7°F | Resp 18 | Ht 61.5 in | Wt 221.8 lb

## 2018-09-25 DIAGNOSIS — Z923 Personal history of irradiation: Secondary | ICD-10-CM | POA: Diagnosis not present

## 2018-09-25 DIAGNOSIS — Z90710 Acquired absence of both cervix and uterus: Secondary | ICD-10-CM | POA: Diagnosis not present

## 2018-09-25 DIAGNOSIS — Z90722 Acquired absence of ovaries, bilateral: Secondary | ICD-10-CM | POA: Diagnosis not present

## 2018-09-25 DIAGNOSIS — C541 Malignant neoplasm of endometrium: Secondary | ICD-10-CM | POA: Diagnosis not present

## 2018-09-25 NOTE — Telephone Encounter (Signed)
CALLED PATIENT TO INFORM OF FU WITH DR. KINARD ON 12-21-18 @ 3:30 PM, LVM FOR A RETURN CALL

## 2018-09-25 NOTE — Patient Instructions (Signed)
Please notify Dr [name] at phone number [phone] if you notice vaginal bleeding, new pelvic or abdominal pains, bloating, feeling full easy, or a change in bladder or bowel function.   Please follow-up with Dr Sondra Come in 3 months as scheduled.  Please have Dr Clabe Seal office call up to contact Dr Serita Grit office (at [phone]) in September when you see him to request an appointment with her for December, 2020.

## 2018-09-25 NOTE — Progress Notes (Signed)
Follow-up Note: Gyn-Onc  Consult was initially requested by Dr. Simona Huh for the evaluation of Elizabeth Crosby 73 y.o. female  CC:  Chief Complaint  Patient presents with  . endometrial cancer    follow-up    Assessment/Plan:  Elizabeth Crosby  is a 73 y.o.  year old a stage IB grade 1endometrioid endometrial adenocarcinoma (MSI stable, MMR normal).  Pathology revealed high/intermediate risk factors for recurrence, therefore vaginal brachytherapy adjuvant therapy was recommended according to NCCN guidelines. It was completed in September, 2019.  I discussed risk for recurrence and typical symptoms encouraged her to notify us of these should they develop between visits.  I recommend she continue to have follow-up every 3 months for 2 years (until September, 2021) in accordance with NCCN guidelines. If at that time she remains cancer free we will space visits to every 6 months until she is 5 years out from completion of therapy (September, 2024). Those visits should include symptom assessment, physical exam and pelvic examination. Pap smears are not indicated or recommended in the routine surveillance of endometrial cancer  HPI: Elizabeth Crosby is a 73 year old P2 who is seen in consultation at the request of Dr Simona Huh for a right adnexal mass, elevated CA 125, postmenopausal bleeding, AGUS pap.  The patient is morbidly obese and has a history of 2 prior vaginal deliveries remotely. She is postmenopausal for approximately 20 years. She has always had normal paps.  In May, 2019 she developed vaginal bleeding. She was seen by Dr Simona Huh on 09/03/17 and a TVUS was performed which revealed a uterus measuring 7.1 x 4.4x3.1cm with a thickened endometrium on 2cm. The endometrium was heterogeneous and had internal areas of associated vascularity.  The right ovary was not well delineated and contacted a complex cystic lesion measuring 4x3.7x3.3cm. There were a few scattered internal septations  without appreciable vascularity.   Pap smear from 09/03/17 showed AGUS with HPV not detected.  CA 125 on 6/4//19 was 74.1  Endometrial biopsy on 09/03/17 showed fragments of benign endometrial tissue with eosinophilic change and papillary metaplasia and no atypia, hyperplasia or malignancy.    She reports a history of morbid obesity, hypertension (with associated LVH, last echo in 2015 showed EF of 60%). She has hypertensive nephropathy with stage 3 CKD. She hs severe lower extremity arthritis. She cannot walk up a flight of stairs due to knee pain and shortness of breath. She has no chest pain.  She has had an open cholecystectomy and appendectomy.  She takes baby ASA daily.   Her father and brother both died from pancreatic cancer. Her maternal aunt has a history of breast cancer.   On 09/30/17 she underwent robotic assisted total hysterectomy, BSO. Staging was not performed due to extreme morbid obesity.  Intraoperative frozen section of the ovary revealed a benign ovarian cyst and low grade endometrial cancer.   Final pathology revealed a benign ovarian cyst, however the endometrium contained a 4.5cm grade 1 endometrioid tumor with 16 of 47m (outer half) myometrial invasion. There was no LVSI. The tumor was MMR normal on IHC and MSI stable.  Due to her age (>70), this single high risk feature of deep myo invasion meant that she met high/intermediate risk factor criteria, and therefore adjuvant therapy with vaginal brachytherapy was recommended in accordance with NCCN guidelines.   She had SOB on exertion after surgery and was seen by her cardiologist, Dr PDorris Carneswho felt this was not cardiac related (normal cardiac testing).  She received vaginal cuff brachytherapy with 5 fractions of a total of 30Gy delivered between 11/27/17 and 12/25/17.   Interval Hx:  She has issues with arthritis and is unable to use the dilator due to physical limitations.  She has no symptoms concerning for  recurrence.   Current Meds:  Outpatient Encounter Medications as of 09/25/2018  Medication Sig  . acetaminophen (TYLENOL) 500 MG tablet Take 2 tablets (1,000 mg total) by mouth every 12 (twelve) hours.  Marland Kitchen amLODipine (NORVASC) 5 MG tablet Take 5 mg by mouth every morning.   Marland Kitchen aspirin EC 81 MG tablet Take 81 mg by mouth every morning.  . Carboxymethylcellul-Glycerin (LUBRICATING EYE DROPS OP) Place 1 drop into both eyes daily as needed (irritation).  . cetirizine (ZYRTEC) 10 MG tablet Take 10 mg by mouth daily.   . fenofibrate 160 MG tablet Take 160 mg by mouth every morning.   . hydrocortisone cream 1 % Apply 1 application topically daily as needed for itching.  . insulin aspart (NOVOLOG) 100 UNIT/ML injection Inject 10-20 Units into the skin See admin instructions. Inject 10 units at breakfast and lunch and 20 units with supper  . Insulin Degludec (TRESIBA FLEXTOUCH) 200 UNIT/ML SOPN Inject 80 Units into the skin daily with breakfast. On a normal day I take 90 units, if it gets in the 70s , I take 5 or 6 units less than the normal  . loperamide (IMODIUM A-D) 2 MG tablet Take 4 mg by mouth as needed for diarrhea or loose stools.  . Melatonin 5 MG TABS Take 5-10 mg by mouth at bedtime as needed (sleep).  . metFORMIN (GLUCOPHAGE) 850 MG tablet Take 850 mg by mouth every evening.   . prochlorperazine (COMPAZINE) 10 MG tablet Take 1 tablet (10 mg total) by mouth every 6 (six) hours as needed for nausea or vomiting.  . simvastatin (ZOCOR) 20 MG tablet Take 20 mg by mouth every evening.  Marland Kitchen telmisartan-hydrochlorothiazide (MICARDIS HCT) 80-25 MG tablet Take 1 tablet by mouth daily.  . traMADol (ULTRAM) 50 MG tablet Take 25-50 mg by mouth 2 (two) times daily as needed for severe pain.   No facility-administered encounter medications on file as of 09/25/2018.     Allergy:  Allergies  Allergen Reactions  . Lipitor [Atorvastatin] Other (See Comments)    Leg cramps  . Other Nausea And Vomiting    Pt  states does not know drug but anesthesia drugs have made her have nausea and vomiting.   . Sulfa Antibiotics     diarrhea  . Potassium-Containing Compounds Rash    Social Hx:   Social History   Socioeconomic History  . Marital status: Married    Spouse name: Not on file  . Number of children: Not on file  . Years of education: Not on file  . Highest education level: Not on file  Occupational History  . Not on file  Social Needs  . Financial resource strain: Not on file  . Food insecurity    Worry: Not on file    Inability: Not on file  . Transportation needs    Medical: Not on file    Non-medical: Not on file  Tobacco Use  . Smoking status: Never Smoker  . Smokeless tobacco: Never Used  Substance and Sexual Activity  . Alcohol use: No  . Drug use: No  . Sexual activity: Yes  Lifestyle  . Physical activity    Days per week: Not on file    Minutes  per session: Not on file  . Stress: Not on file  Relationships  . Social Herbalist on phone: Not on file    Gets together: Not on file    Attends religious service: Not on file    Active member of club or organization: Not on file    Attends meetings of clubs or organizations: Not on file    Relationship status: Not on file  . Intimate partner violence    Fear of current or ex partner: Not on file    Emotionally abused: Not on file    Physically abused: Not on file    Forced sexual activity: Not on file  Other Topics Concern  . Not on file  Social History Narrative  . Not on file    Past Surgical Hx:  Past Surgical History:  Procedure Laterality Date  . APPENDECTOMY  1972  . CATARACT EXTRACTION W/PHACO Right 01/10/2014   Procedure: CATARACT EXTRACTION PHACO AND INTRAOCULAR LENS PLACEMENT ([place]);  Surgeon: Tonny Branch, MD;  Location: [facility];  Service: Ophthalmology;  Laterality: Right;  CDE:5.78  . CATARACT EXTRACTION W/PHACO Left 01/27/2014   Procedure: CATARACT EXTRACTION PHACO AND INTRAOCULAR LENS  PLACEMENT LEFT EYE CDE=5.75;  Surgeon: Tonny Branch, MD;  Location: [facility];  Service: Ophthalmology;  Laterality: Left;  . CHOLECYSTECTOMY  1972  . COLONOSCOPY WITH PROPOFOL N/A 09/01/2012   Procedure: COLONOSCOPY WITH PROPOFOL;  Surgeon: Garlan Fair, MD;  [facility];  Service: Endoscopy;  Laterality: N/A;  . KIDNEY STONE SURGERY    . OMENTECTOMY  09/30/2017   Procedure: OMENTECTOMY;  Surgeon: [name], MD;  Location: [facility];  Service: Gynecology;;  . ROBOTIC ASSISTED TOTAL HYSTERECTOMY WITH BILATERAL SALPINGO OOPHERECTOMY Bilateral 09/30/2017   Procedure: ROBOTIC ASSISTED TOTAL HYSTERECTOMY WITH BILATERAL SALPINGO OOPHORECTOMY;  Surgeon: [name], MD;  Location: [facility];  Service: Gynecology;  Laterality: Bilateral;    Past Medical Hx:  Past Medical History:  Diagnosis Date  . Anxiety   . Arthritis    "knees are bone on bone and i see dr. Tonita Cong for the cortisone injections "   . Cellulitis    hx  of in the legs   . Chronic kidney disease (CKD), stage III (moderate) (HCC)   . Complication of anesthesia    doesnt know the name of the anesthesia , but i was vomiting while i was still under   . Diabetes mellitus without complication ([place])    type 2   . GERD (gastroesophageal reflux disease)   . Glaucoma   . Gout   . History of blood transfusion    as child after accident, after childbirth/hemorroid surgery  . History of kidney stones   . Hyperlipidemia   . Hypertension   . Leukocytes in urine   . Lumbar back pain   . Sleep apnea    non adherant with cpap device     Past Gynecological History:  SVD x 2 No LMP recorded. Patient is postmenopausal.  Family Hx:  Family History  Problem Relation Age of Onset  . Stroke Mother   . Asthma Father   . Colon cancer Father     Review of Systems:  Constitutional  Feels well,    ENT Normal appearing ears and nares bilaterally Skin/Breast  No rash, sores, jaundice, itching, dryness Cardiovascular  No chest pain,  shortness of breath, or edema  Pulmonary  + SOB on exertion Gastro Intestinal  No nausea, vomitting, or diarrhoea. No bright red  blood per rectum, no abdominal pain, change in bowel movement, or constipation.  Genito Urinary  No frequency, urgency, dysuria, no bleeding Musculo Skeletal  + lower extremity joint pains Neurologic   No weakness, numbness, change in gait,  Psychology  No depression, anxiety, insomnia.   Vitals:  There were no vitals taken for this visit.  Physical Exam: WD in NAD Neck  Supple NROM, without any enlargements.  Lymph Node Survey No cervical supraclavicular or inguinal adenopathy Cardiovascular  Pulse normal rate, regularity and rhythm. S1 and S2 normal.  Lungs  Clear to auscultation bilateraly, without wheezes/crackles/rhonchi. Good air movement.  Skin  No rash/lesions/breakdown  Psychiatry  Alert and oriented to person, place, and time  Abdomen  Normoactive bowel sounds, abdomen soft, non-tender and obese without evidence of hernia. Incisions with some crusting but no infection or cellulitis or drainage.  Back No CVA tenderness Genito Urinary  Vaginal cuff smooth and normal, no lesions or masses, in tact, no blood. + agglutination noted.  Rectal  deferred Extremities  No bilateral cyanosis, clubbing or edema.   Thereasa Solo, MD  09/25/2018, 3:11 PM

## 2018-12-03 DIAGNOSIS — I959 Hypotension, unspecified: Secondary | ICD-10-CM | POA: Diagnosis not present

## 2018-12-03 DIAGNOSIS — R55 Syncope and collapse: Secondary | ICD-10-CM | POA: Diagnosis not present

## 2018-12-03 DIAGNOSIS — I4510 Unspecified right bundle-branch block: Secondary | ICD-10-CM | POA: Diagnosis not present

## 2018-12-03 DIAGNOSIS — R42 Dizziness and giddiness: Secondary | ICD-10-CM | POA: Diagnosis not present

## 2018-12-15 ENCOUNTER — Ambulatory Visit: Payer: Self-pay

## 2018-12-15 ENCOUNTER — Other Ambulatory Visit: Payer: Self-pay | Admitting: Family Medicine

## 2018-12-15 ENCOUNTER — Other Ambulatory Visit: Payer: Self-pay

## 2018-12-15 DIAGNOSIS — M79671 Pain in right foot: Secondary | ICD-10-CM

## 2018-12-15 DIAGNOSIS — M25571 Pain in right ankle and joints of right foot: Secondary | ICD-10-CM

## 2018-12-15 DIAGNOSIS — M25561 Pain in right knee: Secondary | ICD-10-CM

## 2018-12-21 ENCOUNTER — Other Ambulatory Visit: Payer: Self-pay

## 2018-12-21 ENCOUNTER — Encounter: Payer: Self-pay | Admitting: Radiation Oncology

## 2018-12-21 ENCOUNTER — Ambulatory Visit
Admission: RE | Admit: 2018-12-21 | Discharge: 2018-12-21 | Disposition: A | Discharge status: Home / Self Care | Payer: BC Managed Care – PPO | Source: Ambulatory Visit | Attending: Radiation Oncology | Admitting: Radiation Oncology

## 2018-12-21 VITALS — BP 113/52 | HR 75 | Temp 98.2°F | Resp 18 | Ht 61.5 in

## 2018-12-21 DIAGNOSIS — C541 Malignant neoplasm of endometrium: Secondary | ICD-10-CM

## 2018-12-21 DIAGNOSIS — Z8542 Personal history of malignant neoplasm of other parts of uterus: Secondary | ICD-10-CM | POA: Diagnosis not present

## 2018-12-21 DIAGNOSIS — Z7984 Long term (current) use of oral hypoglycemic drugs: Secondary | ICD-10-CM | POA: Insufficient documentation

## 2018-12-21 DIAGNOSIS — Z79899 Other long term (current) drug therapy: Secondary | ICD-10-CM | POA: Insufficient documentation

## 2018-12-21 DIAGNOSIS — Z923 Personal history of irradiation: Secondary | ICD-10-CM | POA: Diagnosis not present

## 2018-12-21 DIAGNOSIS — Z794 Long term (current) use of insulin: Secondary | ICD-10-CM | POA: Diagnosis not present

## 2018-12-21 DIAGNOSIS — S8291XA Unspecified fracture of right lower leg, initial encounter for closed fracture: Secondary | ICD-10-CM | POA: Diagnosis not present

## 2018-12-21 DIAGNOSIS — Z7982 Long term (current) use of aspirin: Secondary | ICD-10-CM | POA: Insufficient documentation

## 2018-12-21 DIAGNOSIS — Z08 Encounter for follow-up examination after completed treatment for malignant neoplasm: Secondary | ICD-10-CM | POA: Diagnosis not present

## 2018-12-21 NOTE — Progress Notes (Signed)
Pt presents today for f/u with Dr. Sondra Come. Pt reports pain in right ankle attributed to recent fall and fracture, rated 4/10. Pt denies dysuria/hematuria. Pt denies vaginal bleeding/discharge. Pt denies rectal bleeding, diarrhea/constipation. Pt denies abdominal bloating, N/V.   BP (!) 113/52 (BP Location: Left Arm, Patient Position: Sitting)   Pulse 75   Temp 98.2 F (36.8 C) (Temporal)   Resp 18   Ht 5' 1.5" (1.562 m)   SpO2 100%   BMI 41.23 kg/m   Wt Readings from Last 3 Encounters:  09/25/18 221 lb 12.8 oz (100.6 kg)  04/03/18 228 lb (103.4 kg)  01/29/18 238 lb 2 oz (108 kg)   Loma Sousa, RN BSN

## 2018-12-21 NOTE — Patient Instructions (Signed)
Coronavirus (COVID-19) Are you at risk?  Are you at risk for the Coronavirus (COVID-19)?  To be considered HIGH RISK for Coronavirus (COVID-19), you have to meet the following criteria:  . Traveled to [place], [place], [place], [place] or [place]; or in the [place] to [place], [place], [place], or [place]; and have fever, cough, and shortness of breath within the last 2 weeks of travel OR . Been in close contact with a person diagnosed with COVID-19 within the last 2 weeks and have fever, cough, and shortness of breath . IF YOU DO NOT MEET THESE CRITERIA, YOU ARE CONSIDERED LOW RISK FOR COVID-19.  What to do if you are HIGH RISK for COVID-19?  . If you are having a medical emergency, call 911. . Seek medical care right away. Before you go to a doctor's office, urgent care or emergency department, call ahead and tell them about your recent travel, contact with someone diagnosed with COVID-19, and your symptoms. You should receive instructions from your physician's office regarding next steps of care.  . When you arrive at healthcare provider, tell the healthcare staff immediately you have returned from visiting [place], [place], [place], [place] or [place]; or traveled in the [place] to [place], [place], [place], or [place]; in the last two weeks or you have been in close contact with a person diagnosed with COVID-19 in the last 2 weeks.   . Tell the health care staff about your symptoms: fever, cough and shortness of breath. . After you have been seen by a medical provider, you will be either: o Tested for (COVID-19) and discharged home on quarantine except to seek medical care if symptoms worsen, and asked to  - Stay home and avoid contact with others until you get your results (4-5 days)  - Avoid travel on public transportation if possible (such as bus, train, or airplane) or o Sent to the Emergency Department by EMS for evaluation, COVID-19 testing, and possible  admission depending on your condition and test results.  What to do if you are LOW RISK for COVID-19?  Reduce your risk of any infection by using the same precautions used for avoiding the common cold or flu:  . Wash your hands often with soap and warm water for at least 20 seconds.  If soap and water are not readily available, use an alcohol-based hand sanitizer with at least 60% alcohol.  . If coughing or sneezing, cover your mouth and nose by coughing or sneezing into the elbow areas of your shirt or coat, into a tissue or into your sleeve (not your hands). . Avoid shaking hands with others and consider head nods or verbal greetings only. . Avoid touching your eyes, nose, or mouth with unwashed hands.  . Avoid close contact with people who are sick. . Avoid places or events with large numbers of people in one location, like concerts or sporting events. . Carefully consider travel plans you have or are making. . If you are planning any travel outside or inside the [place], visit the CDC's Travelers' Health webpage for the latest health notices. . If you have some symptoms but not all symptoms, continue to monitor at home and seek medical attention if your symptoms worsen. . If you are having a medical emergency, call 911.   ADDITIONAL HEALTHCARE OPTIONS FOR PATIENTS  [facility] / e-Visit: [email or web address]         [facility]: [phone]  [facility]: [phone]                   [facility]: [phone]   

## 2018-12-21 NOTE — Progress Notes (Signed)
Radiation Oncology         [phone] ________________________________  Name: Elizabeth Crosby MRN: [id number]  Date: 12/21/2018  DOB: [birth year]  Follow-Up Visit Note  CC: [name], MD  [name], MD    ICD-10-CM   1. Endometrial cancer ([place])  C54.1     Diagnosis:   73 y.o. female with Stage IB grade 1 Endometrioid Endometrial Adenocarcinoma (MSI stable, MMR normal)   Interval Since Last Radiation:  1 year  Radiation treatment dates:   11/27/2017, 12/04/2017, 12/11/2017, 12/18/2017, 12/25/2017  Site/dose:   Vaginal Cuff / 30 Gy in 5 fractions  Narrative:  The patient returns today for routine follow-up. She last saw Dr. [name] on 09/25/2018.  On review of systems, she is doing well overall. She experienced a fall recently and fractured her right ankle. She reports pain, rated 4/10. Otherwise, she denies dysuria or hematuria, vaginal discharge or bleeding, rectal bleeding, diarrhea or constipation, abdominal bloating, and nausea or vomiting.  She is unable to use her vaginal dilator due to arthritis  body habitus issues  ALLERGIES:  is allergic to lipitor [atorvastatin]; other; sulfa antibiotics; and potassium-containing compounds.  Meds: Current Outpatient Medications  Medication Sig Dispense Refill  . acetaminophen (TYLENOL) 500 MG tablet Take 2 tablets (1,000 mg total) by mouth every 12 (twelve) hours. 30 tablet 0  . amLODipine (NORVASC) 5 MG tablet Take 5 mg by mouth every morning.     Marland Kitchen aspirin EC 81 MG tablet Take 81 mg by mouth every morning.    . Carboxymethylcellul-Glycerin (LUBRICATING EYE DROPS OP) Place 1 drop into both eyes daily as needed (irritation).    . cetirizine (ZYRTEC) 10 MG tablet Take 10 mg by mouth daily.     . fenofibrate 160 MG tablet Take 160 mg by mouth every morning.     . hydrocortisone cream 1 % Apply 1 application topically daily as needed for itching.    . Insulin Degludec (TRESIBA FLEXTOUCH) 200 UNIT/ML SOPN Inject 35 Units into the  skin daily with breakfast. On a normal day I take 90 units, if it gets in the 70s , I take 5 or 6 units less than the normal    . loperamide (IMODIUM A-D) 2 MG tablet Take 4 mg by mouth as needed for diarrhea or loose stools.    . Melatonin 5 MG TABS Take 5-10 mg by mouth at bedtime as needed (sleep).    . metFORMIN (GLUCOPHAGE) 850 MG tablet Take 850 mg by mouth every evening.     Marland Kitchen omeprazole (PRILOSEC) 20 MG capsule Take 20 mg by mouth 2 (two) times daily.    . prochlorperazine (COMPAZINE) 10 MG tablet Take 1 tablet (10 mg total) by mouth every 6 (six) hours as needed for nausea or vomiting. 30 tablet 0  . RYBELSUS 7 MG TABS 3.5 mg.     . simvastatin (ZOCOR) 20 MG tablet Take 20 mg by mouth every evening.    Marland Kitchen telmisartan-hydrochlorothiazide (MICARDIS HCT) 80-25 MG tablet Take 1 tablet by mouth daily.  0  . traMADol (ULTRAM) 50 MG tablet Take 25-50 mg by mouth 2 (two) times daily as needed for severe pain.    Marland Kitchen insulin aspart (NOVOLOG) 100 UNIT/ML injection Inject 10-20 Units into the skin See admin instructions. Inject 10 units at breakfast and lunch and 20 units with supper     No current facility-administered medications for this encounter.     Physical Findings: The patient is in no acute distress. Patient  is alert and oriented.  height is 5' 1.5" (1.562 m). Her temporal temperature is 98.2 F (36.8 C). Her blood pressure is 113/52 (abnormal) and her pulse is 75. Her respiration is 18 and oxygen saturation is 100%.   Lungs are clear to auscultation bilaterally. Heart has regular rate and rhythm. No palpable cervical, supraclavicular, or axillary adenopathy. Abdomen soft, non-tender, normal bowel sounds.  On pelvic examination the external genitalia were unremarkable. A speculum exam was performed. There are no mucosal lesions noted in the vaginal vault. On bimanual  examination there were no pelvic masses appreciated.  Some agglutination noted.  Vaginal cuff intact.   Lab Findings: Lab  Results  Component Value Date   WBC 10.6 (H) 10/01/2017   HGB 10.3 (L) 10/01/2017   HCT 31.3 (L) 10/01/2017   MCV 88.2 10/01/2017   PLT 185 10/01/2017    Radiographic Findings: Dg Ankle Complete Right  Result Date: 12/15/2018 CLINICAL DATA:  Right foot and ankle pain after recent fall EXAM: RIGHT ANKLE - COMPLETE 3+ VIEW; RIGHT FOOT COMPLETE - 3+ VIEW COMPARISON:  05/13/2017, 12/17/2010 FINDINGS: Acute minimally displaced fracture of the lateral malleolus. Ankle mortise appears congruent without widening. Dedicated radiographs of the right foot demonstrate no acute fracture or dislocation. Mild-to-moderate degenerative changes throughout the foot and ankle. Prominent bidirectional calcaneal enthesophytes. Scattered soft tissue calcifications. IMPRESSION: Minimally displaced lateral malleolar fracture without dislocation. Electronically Signed   By: Davina Poke M.D.   On: 12/15/2018 16:33   Dg Knee Complete 4 Views Right  Result Date: 12/15/2018 CLINICAL DATA:  Knee pain after fall EXAM: RIGHT KNEE - COMPLETE 4+ VIEW COMPARISON:  None. FINDINGS: No acute fracture or dislocation. No knee joint effusion. Mild tricompartmental osteoarthritis, most pronounced within the medial compartment. Bidirectional patellar enthesophytes. Scattered soft tissue calcifications. IMPRESSION: No acute osseous abnormality, right knee. Electronically Signed   By: Davina Poke M.D.   On: 12/15/2018 16:34   Dg Foot Complete Right  Result Date: 12/15/2018 CLINICAL DATA:  Right foot and ankle pain after recent fall EXAM: RIGHT ANKLE - COMPLETE 3+ VIEW; RIGHT FOOT COMPLETE - 3+ VIEW COMPARISON:  05/13/2017, 12/17/2010 FINDINGS: Acute minimally displaced fracture of the lateral malleolus. Ankle mortise appears congruent without widening. Dedicated radiographs of the right foot demonstrate no acute fracture or dislocation. Mild-to-moderate degenerative changes throughout the foot and ankle. Prominent bidirectional  calcaneal enthesophytes. Scattered soft tissue calcifications. IMPRESSION: Minimally displaced lateral malleolar fracture without dislocation. Electronically Signed   By: Davina Poke M.D.   On: 12/15/2018 16:33    Impression: No evidence of recurrence on clinical exam.  Plan:  Patient will follow up with Dr. [name] in 3 months. Follow up in radiation oncology in 6 months.   ____________________________________  Blair Promise, PhD, MD  This document serves as a record of services personally performed by Gery Pray, MD. It was created on his behalf by Wilburn Mylar, a trained medical scribe. The creation of this record is based on the scribe's personal observations and the provider's statements to them. This document has been checked and approved by the attending provider.

## 2018-12-22 ENCOUNTER — Telehealth: Payer: Self-pay | Admitting: *Deleted

## 2018-12-22 NOTE — Telephone Encounter (Signed)
Enid Derry from radiation called and scheduled an appt for December. Enid Derry will contact the patient

## 2018-12-22 NOTE — Telephone Encounter (Signed)
Called patient to inform of fu with Dr. Sondra Come on 06/21/19 @ 3:30 pm, and her fu with Dr. [name] on 03/23/19 - arrival time- 1:30 pm, lvm for a return call

## 2019-02-03 DIAGNOSIS — Z86010 Personal history of colonic polyps: Secondary | ICD-10-CM | POA: Diagnosis not present

## 2019-02-24 DIAGNOSIS — E1122 Type 2 diabetes mellitus with diabetic chronic kidney disease: Secondary | ICD-10-CM | POA: Diagnosis not present

## 2019-02-24 DIAGNOSIS — N183 Chronic kidney disease, stage 3 unspecified: Secondary | ICD-10-CM | POA: Diagnosis not present

## 2019-02-24 DIAGNOSIS — E782 Mixed hyperlipidemia: Secondary | ICD-10-CM | POA: Diagnosis not present

## 2019-02-24 DIAGNOSIS — R8290 Unspecified abnormal findings in urine: Secondary | ICD-10-CM | POA: Diagnosis not present

## 2019-03-01 DIAGNOSIS — E1122 Type 2 diabetes mellitus with diabetic chronic kidney disease: Secondary | ICD-10-CM | POA: Diagnosis not present

## 2019-03-01 DIAGNOSIS — F411 Generalized anxiety disorder: Secondary | ICD-10-CM | POA: Diagnosis not present

## 2019-03-01 DIAGNOSIS — Z6837 Body mass index (BMI) 37.0-37.9, adult: Secondary | ICD-10-CM | POA: Diagnosis not present

## 2019-03-01 DIAGNOSIS — N390 Urinary tract infection, site not specified: Secondary | ICD-10-CM | POA: Diagnosis not present

## 2019-03-01 DIAGNOSIS — I129 Hypertensive chronic kidney disease with stage 1 through stage 4 chronic kidney disease, or unspecified chronic kidney disease: Secondary | ICD-10-CM | POA: Diagnosis not present

## 2019-03-01 DIAGNOSIS — S92901A Unspecified fracture of right foot, initial encounter for closed fracture: Secondary | ICD-10-CM | POA: Diagnosis not present

## 2019-03-01 DIAGNOSIS — H6120 Impacted cerumen, unspecified ear: Secondary | ICD-10-CM | POA: Diagnosis not present

## 2019-03-01 DIAGNOSIS — E782 Mixed hyperlipidemia: Secondary | ICD-10-CM | POA: Diagnosis not present

## 2019-03-01 DIAGNOSIS — Z7189 Other specified counseling: Secondary | ICD-10-CM | POA: Diagnosis not present

## 2019-03-01 DIAGNOSIS — N183 Chronic kidney disease, stage 3 unspecified: Secondary | ICD-10-CM | POA: Diagnosis not present

## 2019-03-01 DIAGNOSIS — Z794 Long term (current) use of insulin: Secondary | ICD-10-CM | POA: Diagnosis not present

## 2019-03-23 ENCOUNTER — Inpatient Hospital Stay: Payer: BC Managed Care – PPO | Attending: Gynecologic Oncology | Admitting: Gynecologic Oncology

## 2019-03-23 ENCOUNTER — Other Ambulatory Visit: Payer: Self-pay

## 2019-03-23 ENCOUNTER — Encounter: Payer: Self-pay | Admitting: Gynecologic Oncology

## 2019-03-23 VITALS — BP 135/58 | HR 93 | Temp 98.3°F | Resp 17 | Ht 61.5 in | Wt 204.6 lb

## 2019-03-23 DIAGNOSIS — R0602 Shortness of breath: Secondary | ICD-10-CM

## 2019-03-23 DIAGNOSIS — Z90722 Acquired absence of ovaries, bilateral: Secondary | ICD-10-CM | POA: Diagnosis not present

## 2019-03-23 DIAGNOSIS — Z90710 Acquired absence of both cervix and uterus: Secondary | ICD-10-CM | POA: Diagnosis not present

## 2019-03-23 DIAGNOSIS — I129 Hypertensive chronic kidney disease with stage 1 through stage 4 chronic kidney disease, or unspecified chronic kidney disease: Secondary | ICD-10-CM

## 2019-03-23 DIAGNOSIS — Z800 Family history of malignant neoplasm of digestive organs: Secondary | ICD-10-CM | POA: Insufficient documentation

## 2019-03-23 DIAGNOSIS — M1990 Unspecified osteoarthritis, unspecified site: Secondary | ICD-10-CM | POA: Diagnosis not present

## 2019-03-23 DIAGNOSIS — Z923 Personal history of irradiation: Secondary | ICD-10-CM | POA: Diagnosis not present

## 2019-03-23 DIAGNOSIS — N183 Chronic kidney disease, stage 3 unspecified: Secondary | ICD-10-CM | POA: Insufficient documentation

## 2019-03-23 DIAGNOSIS — E1122 Type 2 diabetes mellitus with diabetic chronic kidney disease: Secondary | ICD-10-CM

## 2019-03-23 DIAGNOSIS — Z7982 Long term (current) use of aspirin: Secondary | ICD-10-CM

## 2019-03-23 DIAGNOSIS — Z8542 Personal history of malignant neoplasm of other parts of uterus: Secondary | ICD-10-CM | POA: Diagnosis present

## 2019-03-23 DIAGNOSIS — Z803 Family history of malignant neoplasm of breast: Secondary | ICD-10-CM

## 2019-03-23 DIAGNOSIS — C541 Malignant neoplasm of endometrium: Secondary | ICD-10-CM

## 2019-03-23 NOTE — Patient Instructions (Signed)
Please notify Dr [name] at phone number [phone] if you notice vaginal bleeding, new pelvic or abdominal pains, bloating, feeling full easy, or a change in bladder or bowel function.   Please return to see Dr [name] in June 2021 as scheduled.

## 2019-03-23 NOTE — Progress Notes (Signed)
Follow-up Note: Gyn-Onc  Consult was initially requested by Dr. Simona Huh for the evaluation of Elizabeth Crosby 73 y.o. female  CC:  Chief Complaint  Patient presents with  . Endometrial cancer [facility])    Assessment/Plan:  Elizabeth Crosby  is a 73 y.o.  year old a stage IB grade 1endometrioid endometrial adenocarcinoma (MSI stable, MMR normal).  Pathology revealed high/intermediate risk factors for recurrence, therefore vaginal brachytherapy adjuvant therapy was recommended according to NCCN guidelines. It was completed in September, 2019.  I discussed risk for recurrence and typical symptoms encouraged her to notify us of these should they develop between visits.  I recommend she continue to have follow-up every 3 months for 2 years (until September, 2021) in accordance with NCCN guidelines. If at that time she remains cancer free we will space visits to every 6 months until she is 5 years out from completion of therapy (September, 2024). Those visits should include symptom assessment, physical exam and pelvic examination. Pap smears are not indicated or recommended in the routine surveillance of endometrial cancer  HPI: [name] is a 73 year old P2 who is seen in consultation at the request of Dr Simona Huh for a right adnexal mass, elevated CA 125, postmenopausal bleeding, AGUS pap.  The patient is morbidly obese and has a history of 2 prior vaginal deliveries remotely. She is postmenopausal for approximately 20 years. She has always had normal paps.  In May, 2019 she developed vaginal bleeding. She was seen by Dr Simona Huh on 09/03/17 and a TVUS was performed which revealed a uterus measuring 7.1 x 4.4x3.1cm with a thickened endometrium on 2cm. The endometrium was heterogeneous and had internal areas of associated vascularity.  The right ovary was not well delineated and contacted a complex cystic lesion measuring 4x3.7x3.3cm. There were a few scattered internal septations without  appreciable vascularity.   Pap smear from 09/03/17 showed AGUS with HPV not detected.  CA 125 on 6/4//19 was 74.1  Endometrial biopsy on 09/03/17 showed fragments of benign endometrial tissue with eosinophilic change and papillary metaplasia and no atypia, hyperplasia or malignancy.    She reports a history of morbid obesity, hypertension (with associated LVH, last echo in 2015 showed EF of 60%). She has hypertensive nephropathy with stage 3 CKD. She hs severe lower extremity arthritis. She cannot walk up a flight of stairs due to knee pain and shortness of breath. She has no chest pain.  She has had an open cholecystectomy and appendectomy.  She takes baby ASA daily.   Her father and brother both died from pancreatic cancer. Her maternal aunt has a history of breast cancer.   On 09/30/17 she underwent robotic assisted total hysterectomy, BSO. Staging was not performed due to extreme morbid obesity.  Intraoperative frozen section of the ovary revealed a benign ovarian cyst and low grade endometrial cancer.   Final pathology revealed a benign ovarian cyst, however the endometrium contained a 4.5cm grade 1 endometrioid tumor with 16 of 45m (outer half) myometrial invasion. There was no LVSI. The tumor was MMR normal on IHC and MSI stable.  Due to her age (>70), this single high risk feature of deep myo invasion meant that she met high/intermediate risk factor criteria, and therefore adjuvant therapy with vaginal brachytherapy was recommended in accordance with NCCN guidelines.   She had SOB on exertion after surgery and was seen by her cardiologist, Dr PDorris Carneswho felt this was not cardiac related (normal cardiac testing).  She received  vaginal cuff brachytherapy with 5 fractions of a total of 30Gy delivered between 11/27/17 and 12/25/17.   Interval Hx:  She has no new symptoms or concerns for recurrence.   Current Meds:  Outpatient Encounter Medications as of 03/23/2019  Medication Sig   . acetaminophen (TYLENOL) 500 MG tablet Take 2 tablets (1,000 mg total) by mouth every 12 (twelve) hours.  Marland Kitchen aspirin EC 81 MG tablet Take 81 mg by mouth every morning.  . Carboxymethylcellul-Glycerin (LUBRICATING EYE DROPS OP) Place 1 drop into both eyes daily as needed (irritation).  . cetirizine (ZYRTEC) 10 MG tablet Take 10 mg by mouth daily.   . fenofibrate 160 MG tablet Take 160 mg by mouth every morning.   . hydrocortisone cream 1 % Apply 1 application topically daily as needed for itching.  . Insulin Degludec (TRESIBA FLEXTOUCH) 200 UNIT/ML SOPN Inject 35 Units into the skin daily with breakfast. On a normal day I take 90 units, if it gets in the 70s , I take 5 or 6 units less than the normal  . loperamide (IMODIUM A-D) 2 MG tablet Take 4 mg by mouth as needed for diarrhea or loose stools.  . Melatonin 5 MG TABS Take 5-10 mg by mouth at bedtime as needed (sleep).  . metFORMIN (GLUCOPHAGE) 850 MG tablet Take 850 mg by mouth every evening.   Marland Kitchen omeprazole (PRILOSEC) 20 MG capsule Take 20 mg by mouth 2 (two) times daily.  . prochlorperazine (COMPAZINE) 10 MG tablet Take 1 tablet (10 mg total) by mouth every 6 (six) hours as needed for nausea or vomiting.  . RYBELSUS 3 MG TABS 3 mg.   . simvastatin (ZOCOR) 20 MG tablet Take 20 mg by mouth every evening.  Marland Kitchen telmisartan-hydrochlorothiazide (MICARDIS HCT) 80-25 MG tablet Take 1 tablet by mouth daily.  . traMADol (ULTRAM) 50 MG tablet Take 25-50 mg by mouth 2 (two) times daily as needed for severe pain.  . [DISCONTINUED] amLODipine (NORVASC) 5 MG tablet Take 5 mg by mouth every morning.   . [DISCONTINUED] insulin aspart (NOVOLOG) 100 UNIT/ML injection Inject 10-20 Units into the skin See admin instructions. Inject 10 units at breakfast and lunch and 20 units with supper   No facility-administered encounter medications on file as of 03/23/2019.    Allergy:  Allergies  Allergen Reactions  . Lipitor [Atorvastatin] Other (See Comments)    Leg  cramps  . Other Nausea And Vomiting    Pt states does not know drug but anesthesia drugs have made her have nausea and vomiting.   . Sulfa Antibiotics     diarrhea  . Potassium-Containing Compounds Rash    Social Hx:   Social History   Socioeconomic History  . Marital status: Married    Spouse name: Not on file  . Number of children: Not on file  . Years of education: Not on file  . Highest education level: Not on file  Occupational History  . Not on file  Tobacco Use  . Smoking status: Never Smoker  . Smokeless tobacco: Never Used  Substance and Sexual Activity  . Alcohol use: No  . Drug use: No  . Sexual activity: Yes  Other Topics Concern  . Not on file  Social History Narrative  . Not on file   Social Determinants of Health   Financial Resource Strain:   . Difficulty of Paying Living Expenses: Not on file  Food Insecurity:   . Worried About Charity fundraiser in the Last Year: Not  on file  . Ran Out of Food in the Last Year: Not on file  Transportation Needs:   . Lack of Transportation (Medical): Not on file  . Lack of Transportation (Non-Medical): Not on file  Physical Activity:   . Days of Exercise per Week: Not on file  . Minutes of Exercise per Session: Not on file  Stress:   . Feeling of Stress : Not on file  Social Connections:   . Frequency of Communication with Friends and Family: Not on file  . Frequency of Social Gatherings with Friends and Family: Not on file  . Attends Religious Services: Not on file  . Active Member of Clubs or Organizations: Not on file  . Attends Archivist Meetings: Not on file  . Marital Status: Not on file  Intimate Partner Violence:   . Fear of Current or Ex-Partner: Not on file  . Emotionally Abused: Not on file  . Physically Abused: Not on file  . Sexually Abused: Not on file    Past Surgical Hx:  Past Surgical History:  Procedure Laterality Date  . APPENDECTOMY  1972  . CATARACT EXTRACTION W/PHACO  Right 01/10/2014   Procedure: CATARACT EXTRACTION PHACO AND INTRAOCULAR LENS PLACEMENT ([place]);  Surgeon: Tonny Branch, MD;  Location: [facility];  Service: Ophthalmology;  Laterality: Right;  CDE:5.78  . CATARACT EXTRACTION W/PHACO Left 01/27/2014   Procedure: CATARACT EXTRACTION PHACO AND INTRAOCULAR LENS PLACEMENT LEFT EYE CDE=5.75;  Surgeon: Tonny Branch, MD;  Location: [facility];  Service: Ophthalmology;  Laterality: Left;  . CHOLECYSTECTOMY  1972  . COLONOSCOPY WITH PROPOFOL N/A 09/01/2012   Procedure: COLONOSCOPY WITH PROPOFOL;  Surgeon: Garlan Fair, MD;  Location: [facility];  Service: Endoscopy;  Laterality: N/A;  . KIDNEY STONE SURGERY    . OMENTECTOMY  09/30/2017   Procedure: OMENTECTOMY;  Surgeon: [name], MD;  Location: [facility];  Service: Gynecology;;  . ROBOTIC ASSISTED TOTAL HYSTERECTOMY WITH BILATERAL SALPINGO OOPHERECTOMY Bilateral 09/30/2017   Procedure: ROBOTIC ASSISTED TOTAL HYSTERECTOMY WITH BILATERAL SALPINGO OOPHORECTOMY;  Surgeon: [name], MD;  Location: [facility];  Service: Gynecology;  Laterality: Bilateral;    Past Medical Hx:  Past Medical History:  Diagnosis Date  . Anxiety   . Arthritis    "knees are bone on bone and i see dr. Tonita Cong for the cortisone injections "   . Cellulitis    hx  of in the legs   . Chronic kidney disease (CKD), stage III (moderate)   . Complication of anesthesia    doesnt know the name of the anesthesia , but i was vomiting while i was still under   . Diabetes mellitus without complication ([place])    type 2   . GERD (gastroesophageal reflux disease)   . Glaucoma   . Gout   . History of blood transfusion    as child after accident, after childbirth/hemorroid surgery  . History of kidney stones   . Hyperlipidemia   . Hypertension   . Leukocytes in urine   . Lumbar back pain   . Sleep apnea    non adherant with cpap device     Past Gynecological History:  SVD x 2 No LMP recorded. Patient is postmenopausal.  Family Hx:  Family History   Problem Relation Age of Onset  . Stroke Mother   . Asthma Father   . Colon cancer Father     Review of Systems:  Constitutional  Feels well,    ENT Normal appearing ears and  nares bilaterally Skin/Breast  No rash, sores, jaundice, itching, dryness Cardiovascular  No chest pain, shortness of breath, or edema  Pulmonary  + SOB on exertion Gastro Intestinal  No nausea, vomitting, or diarrhoea. No bright red blood per rectum, no abdominal pain, change in bowel movement, or constipation.  Genito Urinary  No frequency, urgency, dysuria, no bleeding Musculo Skeletal  + lower extremity joint pains Neurologic   No weakness, numbness, change in gait,  Psychology  No depression, anxiety, insomnia.   Vitals:  Blood pressure (!) 135/58, pulse 93, temperature 98.3 F (36.8 C), temperature source Temporal, resp. rate 17, height 5' 1.5" (1.562 m), weight 204 lb 9.6 oz (92.8 kg), SpO2 100 %.  Physical Exam: WD in NAD Neck  Supple NROM, without any enlargements.  Lymph Node Survey No cervical supraclavicular or inguinal adenopathy Cardiovascular  Pulse normal rate, regularity and rhythm. S1 and S2 normal.  Lungs  Clear to auscultation bilateraly, without wheezes/crackles/rhonchi. Good air movement.  Skin  No rash/lesions/breakdown  Psychiatry  Alert and oriented to person, place, and time  Abdomen  Normoactive bowel sounds, abdomen soft, non-tender and obese without evidence of hernia. Incisions with some crusting but no infection or cellulitis or drainage.  Back No CVA tenderness Genito Urinary  Vaginal cuff smooth and normal, no lesions or masses, in tact, no blood. + agglutination noted.  Rectal  deferred Extremities  No bilateral cyanosis, clubbing or edema.   Thereasa Solo, MD  03/23/2019, 2:33 PM

## 2019-03-29 DIAGNOSIS — H9193 Unspecified hearing loss, bilateral: Secondary | ICD-10-CM | POA: Diagnosis not present

## 2019-03-29 DIAGNOSIS — H6980 Other specified disorders of Eustachian tube, unspecified ear: Secondary | ICD-10-CM | POA: Diagnosis not present

## 2019-06-02 ENCOUNTER — Other Ambulatory Visit (HOSPITAL_COMMUNITY): Payer: Self-pay | Admitting: Otolaryngology

## 2019-06-02 ENCOUNTER — Other Ambulatory Visit: Payer: Self-pay | Admitting: Otolaryngology

## 2019-06-02 DIAGNOSIS — H918X2 Other specified hearing loss, left ear: Secondary | ICD-10-CM

## 2019-06-02 DIAGNOSIS — IMO0001 Reserved for inherently not codable concepts without codable children: Secondary | ICD-10-CM

## 2019-06-16 ENCOUNTER — Ambulatory Visit (HOSPITAL_COMMUNITY)
Admission: RE | Admit: 2019-06-16 | Discharge: 2019-06-16 | Disposition: A | Discharge status: Home / Self Care | Payer: Medicare Other | Source: Ambulatory Visit | Attending: Otolaryngology | Admitting: Otolaryngology

## 2019-06-16 ENCOUNTER — Other Ambulatory Visit: Payer: Self-pay

## 2019-06-16 DIAGNOSIS — H918X2 Other specified hearing loss, left ear: Secondary | ICD-10-CM | POA: Insufficient documentation

## 2019-06-16 DIAGNOSIS — IMO0001 Reserved for inherently not codable concepts without codable children: Secondary | ICD-10-CM

## 2019-06-16 LAB — POCT I-STAT CREATININE: Creatinine, Ser: 1 mg/dL (ref 0.44–1.00)

## 2019-06-16 MED ORDER — GADOBUTROL 1 MMOL/ML IV SOLN
10.0000 mL | Freq: Once | INTRAVENOUS | Status: AC | PRN
  Administered 2019-06-16: 10 mL via INTRAVENOUS

## 2019-06-18 ENCOUNTER — Telehealth: Payer: Self-pay | Admitting: *Deleted

## 2019-06-18 NOTE — Telephone Encounter (Signed)
CALLED PATIENT TO INFORM OF FU BEING MOVED TO 06-22-19 @ 3 PM, LVM FOR A RETURN CALL

## 2019-06-21 ENCOUNTER — Ambulatory Visit: Payer: Medicare Other | Admitting: Radiation Oncology

## 2019-06-22 ENCOUNTER — Ambulatory Visit: Payer: Medicare Other | Admitting: Radiation Oncology

## 2019-06-23 ENCOUNTER — Other Ambulatory Visit: Payer: Self-pay | Admitting: Otolaryngology

## 2019-06-23 DIAGNOSIS — IMO0001 Reserved for inherently not codable concepts without codable children: Secondary | ICD-10-CM

## 2019-06-23 DIAGNOSIS — H918X2 Other specified hearing loss, left ear: Secondary | ICD-10-CM

## 2019-07-21 NOTE — Progress Notes (Signed)
Radiation Oncology         [phone] ________________________________  Name: Elizabeth Crosby MRN: [id number]  Date: 07/22/2019  DOB: 07/05/1945  Follow-Up Visit Note  CC: [name], MD  [name], MD    ICD-10-CM   1. Endometrial cancer ([facility])  C54.1   2. Morbid (severe) obesity due to excess calories (HCC) Chronic E66.01     Diagnosis: Stage IB grade 1 Endometrioid Endometrial Adenocarcinoma (MSI stable, MMR normal)   Interval Since Last Radiation: One year, six months, three weeks, and six days.  Radiation treatment dates:  11/27/2017, 12/04/2017, 12/11/2017, 12/18/2017, 12/25/2017  Site/dose:   Vaginal Cuff / 30 Gy in 5 fractions  Narrative:  The patient returns today for routine follow-up. She is doing well overall. She last saw Dr. [name] on 03/23/2019, during which time she had no new symptoms or concerns for recurrence.  Patient has completed COVID-19 vaccination without difficulty.  Of note, the patient did undergo a brain MRI on 06/16/2019 for asymmetric left-sided hearing loss. Results showed normal internal auditory canals and inner ear structures. There was some mild multifocal white matter hyperintensity, most commonly due to chronic ischemic microangiopathy. No acute intracranial abnormality seen.  On review of systems, she reports pain in right foot rated 4/10 and constipation attributed to Tramadol usage. She denies dysuria, hematuria, vaginal bleeding/discharge, rectal bleeding, abdominal bloating, nausea, and vomiting.  Patient has lost significant amount of weight over the past year and a half. She attributes this to changes in her diabetic medication. She is unable to exercise in light of her health issues.  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   ALLERGIES:  is allergic to lipitor [atorvastatin]; other; sulfa antibiotics; and potassium-containing compounds.  Meds: Current Outpatient Medications  Medication Sig Dispense Refill  .  acetaminophen (TYLENOL) 500 MG tablet Take 2 tablets (1,000 mg total) by mouth every 12 (twelve) hours. 30 tablet 0  . aspirin EC 81 MG tablet Take 81 mg by mouth every morning.    . Carboxymethylcellul-Glycerin (LUBRICATING EYE DROPS OP) Place 1 drop into both eyes daily as needed (irritation).    . cetirizine (ZYRTEC) 10 MG tablet Take 10 mg by mouth daily.     . fenofibrate 160 MG tablet Take 160 mg by mouth every morning.     . hydrocortisone cream 1 % Apply 1 application topically daily as needed for itching.    . Insulin Degludec (TRESIBA FLEXTOUCH) 200 UNIT/ML SOPN Inject 35 Units into the skin daily with breakfast. On a normal day I take 90 units, if it gets in the 70s , I take 5 or 6 units less than the normal    . loperamide (IMODIUM A-D) 2 MG tablet Take 4 mg by mouth as needed for diarrhea or loose stools.    . Melatonin 5 MG TABS Take 5-10 mg by mouth at bedtime as needed (sleep).    . metFORMIN (GLUCOPHAGE) 850 MG tablet Take 850 mg by mouth every evening.     Marland Kitchen omeprazole (PRILOSEC) 20 MG capsule Take 20 mg by mouth 2 (two) times daily.    . prochlorperazine (COMPAZINE) 10 MG tablet Take 1 tablet (10 mg total) by mouth every 6 (six) hours as needed for nausea or vomiting. 30 tablet 0  . RYBELSUS 3 MG TABS 3 mg.     . simvastatin (ZOCOR) 20 MG tablet Take 20 mg by mouth every evening.    Marland Kitchen telmisartan-hydrochlorothiazide (MICARDIS HCT) 80-25 MG tablet Take  1 tablet by mouth daily.  0  . traMADol (ULTRAM) 50 MG tablet Take 25-50 mg by mouth 2 (two) times daily as needed for severe pain.     No current facility-administered medications for this encounter.    Physical Findings: The patient is in no acute distress. Patient is alert and oriented.  height is 5' 1" (1.549 m) and weight is 209 lb 3.2 oz (94.9 kg). Her temperature is 98.7 F (37.1 C). Her blood pressure is 158/56 (abnormal) and her pulse is 77. Her respiration is 24 (abnormal) and oxygen saturation is 99%.   Lungs are  clear to auscultation bilaterally. Heart has regular rate and rhythm. No palpable cervical, supraclavicular, or axillary adenopathy. Abdomen soft, non-tender, normal bowel sounds.  On pelvic examination the external genitalia were unremarkable. A speculum exam was performed. There are no mucosal lesions noted in the vaginal vault. On bimanual  examination there were no pelvic masses appreciated.  minimal agglutination noted.  Vaginal cuff intact.    Lab Findings: Lab Results  Component Value Date   WBC 10.6 (H) 10/01/2017   HGB 10.3 (L) 10/01/2017   HCT 31.3 (L) 10/01/2017   MCV 88.2 10/01/2017   PLT 185 10/01/2017    Radiographic Findings: No results found.  Impression: Stage IB grade 1 Endometrioid Endometrial Adenocarcinoma (MSI stable, MMR normal)   No evidence of recurrence on clinical exam. Congratulated patient on her weight loss  Plan: The patient is scheduled to follow-up with Dr. [name] on 09/08/2019. Routine follow-up with radiation oncology in six months.  ____________________________________  Blair Promise, PhD, MD  This document serves as a record of services personally performed by Gery Pray, MD. It was created on his behalf by Clerance Lav, a trained medical scribe. The creation of this record is based on the scribe's personal observations and the provider's statements to them. This document has been checked and approved by the attending provider.

## 2019-07-22 ENCOUNTER — Other Ambulatory Visit: Payer: Self-pay

## 2019-07-22 ENCOUNTER — Ambulatory Visit
Admission: RE | Admit: 2019-07-22 | Discharge: 2019-07-22 | Disposition: A | Discharge status: Home / Self Care | Payer: Medicare Other | Source: Ambulatory Visit | Attending: Radiation Oncology | Admitting: Radiation Oncology

## 2019-07-22 ENCOUNTER — Encounter: Payer: Self-pay | Admitting: Radiation Oncology

## 2019-07-22 VITALS — BP 158/56 | HR 77 | Temp 98.7°F | Resp 24 | Ht 61.0 in | Wt 209.2 lb

## 2019-07-22 DIAGNOSIS — E119 Type 2 diabetes mellitus without complications: Secondary | ICD-10-CM | POA: Insufficient documentation

## 2019-07-22 DIAGNOSIS — Z794 Long term (current) use of insulin: Secondary | ICD-10-CM | POA: Insufficient documentation

## 2019-07-22 DIAGNOSIS — C541 Malignant neoplasm of endometrium: Secondary | ICD-10-CM

## 2019-07-22 DIAGNOSIS — K5900 Constipation, unspecified: Secondary | ICD-10-CM | POA: Diagnosis not present

## 2019-07-22 DIAGNOSIS — Z923 Personal history of irradiation: Secondary | ICD-10-CM | POA: Insufficient documentation

## 2019-07-22 DIAGNOSIS — M79671 Pain in right foot: Secondary | ICD-10-CM | POA: Diagnosis not present

## 2019-07-22 DIAGNOSIS — Z8542 Personal history of malignant neoplasm of other parts of uterus: Secondary | ICD-10-CM | POA: Diagnosis not present

## 2019-07-22 DIAGNOSIS — Z79899 Other long term (current) drug therapy: Secondary | ICD-10-CM | POA: Insufficient documentation

## 2019-07-22 DIAGNOSIS — Z7982 Long term (current) use of aspirin: Secondary | ICD-10-CM | POA: Insufficient documentation

## 2019-07-22 NOTE — Patient Instructions (Signed)
Coronavirus (COVID-19) Are you at risk?  Are you at risk for the Coronavirus (COVID-19)?  To be considered HIGH RISK for Coronavirus (COVID-19), you have to meet the following criteria:  . Traveled to [place], [place], [place], [place] or [place]; or in the [place] to [place], [place], [place], or [place]; and have fever, cough, and shortness of breath within the last 2 weeks of travel OR . Been in close contact with a person diagnosed with COVID-19 within the last 2 weeks and have fever, cough, and shortness of breath . IF YOU DO NOT MEET THESE CRITERIA, YOU ARE CONSIDERED LOW RISK FOR COVID-19.  What to do if you are HIGH RISK for COVID-19?  . If you are having a medical emergency, call 911. . Seek medical care right away. Before you go to a doctor's office, urgent care or emergency department, call ahead and tell them about your recent travel, contact with someone diagnosed with COVID-19, and your symptoms. You should receive instructions from your physician's office regarding next steps of care.  . When you arrive at healthcare provider, tell the healthcare staff immediately you have returned from visiting [place], [place], [place], [place] or [place]; or traveled in the [place] to [place], [place], [place], or [place]; in the last two weeks or you have been in close contact with a person diagnosed with COVID-19 in the last 2 weeks.   . Tell the health care staff about your symptoms: fever, cough and shortness of breath. . After you have been seen by a medical provider, you will be either: o Tested for (COVID-19) and discharged home on quarantine except to seek medical care if symptoms worsen, and asked to  - Stay home and avoid contact with others until you get your results (4-5 days)  - Avoid travel on public transportation if possible (such as bus, train, or airplane) or o Sent to the Emergency Department by EMS for evaluation, COVID-19 testing, and possible  admission depending on your condition and test results.  What to do if you are LOW RISK for COVID-19?  Reduce your risk of any infection by using the same precautions used for avoiding the common cold or flu:  . Wash your hands often with soap and warm water for at least 20 seconds.  If soap and water are not readily available, use an alcohol-based hand sanitizer with at least 60% alcohol.  . If coughing or sneezing, cover your mouth and nose by coughing or sneezing into the elbow areas of your shirt or coat, into a tissue or into your sleeve (not your hands). . Avoid shaking hands with others and consider head nods or verbal greetings only. . Avoid touching your eyes, nose, or mouth with unwashed hands.  . Avoid close contact with people who are sick. . Avoid places or events with large numbers of people in one location, like concerts or sporting events. . Carefully consider travel plans you have or are making. . If you are planning any travel outside or inside the [place], visit the CDC's Travelers' Health webpage for the latest health notices. . If you have some symptoms but not all symptoms, continue to monitor at home and seek medical attention if your symptoms worsen. . If you are having a medical emergency, call 911.   ADDITIONAL HEALTHCARE OPTIONS FOR PATIENTS  [facility] / e-Visit: [email or web address]         [facility]: [phone]  [place]: [phone]                   [facility]: [phone]   

## 2019-07-22 NOTE — Progress Notes (Signed)
Pt presents today for f/u with Dr. Sondra Come. Pt reports pain in RIGHT foot, rated 4/10. Pt denies dysuria/hematuria. Pt denies vaginal bleeding/discharge. Pt denies rectal bleeding. Pt reports some constipation and attributes to tramadol usage. Pt denies abdominal bloating, N/V.  BP (!) 158/56 (BP Location: Right Arm, Patient Position: Sitting, Cuff Size: Large)   Pulse 77   Temp 98.7 F (37.1 C)   Resp (!) 24   Ht 5\' 1"  (1.549 m)   Wt 209 lb 3.2 oz (94.9 kg)   SpO2 99%   BMI 39.53 kg/m   Wt Readings from Last 3 Encounters:  07/22/19 209 lb 3.2 oz (94.9 kg)  03/23/19 204 lb 9.6 oz (92.8 kg)  09/25/18 221 lb 12.8 oz (100.6 kg)   Loma Sousa, RN BSN

## 2019-08-16 ENCOUNTER — Telehealth: Payer: Self-pay | Admitting: Oncology

## 2019-08-16 NOTE — Telephone Encounter (Signed)
Daffney called to check about an appointment with Dr. [name] in June.  Advised her it is scheduled for 09/08/19 at 2:30.  She verbalized understanding.

## 2019-09-08 ENCOUNTER — Other Ambulatory Visit: Payer: Self-pay

## 2019-09-08 ENCOUNTER — Inpatient Hospital Stay: Payer: Medicare Other | Attending: Gynecologic Oncology | Admitting: Gynecologic Oncology

## 2019-09-08 ENCOUNTER — Encounter: Payer: Self-pay | Admitting: Gynecologic Oncology

## 2019-09-08 VITALS — BP 134/59 | HR 84 | Temp 98.0°F | Resp 17 | Ht 61.0 in | Wt 202.0 lb

## 2019-09-08 DIAGNOSIS — Z853 Personal history of malignant neoplasm of breast: Secondary | ICD-10-CM | POA: Insufficient documentation

## 2019-09-08 DIAGNOSIS — Z90722 Acquired absence of ovaries, bilateral: Secondary | ICD-10-CM | POA: Diagnosis not present

## 2019-09-08 DIAGNOSIS — Z90710 Acquired absence of both cervix and uterus: Secondary | ICD-10-CM

## 2019-09-08 DIAGNOSIS — E119 Type 2 diabetes mellitus without complications: Secondary | ICD-10-CM | POA: Insufficient documentation

## 2019-09-08 DIAGNOSIS — N183 Chronic kidney disease, stage 3 unspecified: Secondary | ICD-10-CM | POA: Diagnosis not present

## 2019-09-08 DIAGNOSIS — E785 Hyperlipidemia, unspecified: Secondary | ICD-10-CM | POA: Insufficient documentation

## 2019-09-08 DIAGNOSIS — Z7982 Long term (current) use of aspirin: Secondary | ICD-10-CM | POA: Insufficient documentation

## 2019-09-08 DIAGNOSIS — Z923 Personal history of irradiation: Secondary | ICD-10-CM

## 2019-09-08 DIAGNOSIS — K219 Gastro-esophageal reflux disease without esophagitis: Secondary | ICD-10-CM | POA: Diagnosis not present

## 2019-09-08 DIAGNOSIS — M545 Low back pain: Secondary | ICD-10-CM | POA: Insufficient documentation

## 2019-09-08 DIAGNOSIS — I1310 Hypertensive heart and chronic kidney disease without heart failure, with stage 1 through stage 4 chronic kidney disease, or unspecified chronic kidney disease: Secondary | ICD-10-CM | POA: Diagnosis not present

## 2019-09-08 DIAGNOSIS — R971 Elevated cancer antigen 125 [CA 125]: Secondary | ICD-10-CM | POA: Insufficient documentation

## 2019-09-08 DIAGNOSIS — C541 Malignant neoplasm of endometrium: Secondary | ICD-10-CM

## 2019-09-08 NOTE — Progress Notes (Signed)
Follow-up Note: Gyn-Onc  Consult was initially requested by Dr. Simona Huh for the evaluation of Elizabeth Crosby 74 y.o. female  CC:  Chief Complaint  Patient presents with   Endometrial cancer [facility])    Assessment/Plan:  Elizabeth Crosby  is a 74 y.o.  year old a stage IB grade 1endometrioid endometrial adenocarcinoma (MSI stable, MMR normal).  Pathology revealed high/intermediate risk factors for recurrence, therefore vaginal brachytherapy adjuvant therapy was recommended according to NCCN guidelines. It was completed in September, 2019.  I discussed risk for recurrence and typical symptoms encouraged her to notify us of these should they develop between visits.  I recommend she continue to have follow-up every 3 months for 2 years (until September, 2021) in accordance with NCCN guidelines. If at that time she remains cancer free we will space visits to every 6 months until she is 5 years out from completion of therapy (September, 2024). Those visits should include symptom assessment, physical exam and pelvic examination. Pap smears are not indicated or recommended in the routine surveillance of endometrial cancer  HPI: Elizabeth Crosby is a 74 year old P2 who is seen in consultation at the request of Dr Simona Huh for a right adnexal mass, elevated CA 125, postmenopausal bleeding, AGUS pap.  The patient is morbidly obese and has a history of 2 prior vaginal deliveries remotely. She is postmenopausal for approximately 20 years. She has always had normal paps.  In May, 2019 she developed vaginal bleeding. She was seen by Dr Simona Huh on 09/03/17 and a TVUS was performed which revealed a uterus measuring 7.1 x 4.4x3.1cm with a thickened endometrium on 2cm. The endometrium was heterogeneous and had internal areas of associated vascularity.  The right ovary was not well delineated and contacted a complex cystic lesion measuring 4x3.7x3.3cm. There were a few scattered internal septations without  appreciable vascularity.   Pap smear from 09/03/17 showed AGUS with HPV not detected.  CA 125 on 6/4//19 was 74.1  Endometrial biopsy on 09/03/17 showed fragments of benign endometrial tissue with eosinophilic change and papillary metaplasia and no atypia, hyperplasia or malignancy.    She reports a history of morbid obesity, hypertension (with associated LVH, last echo in 2015 showed EF of 60%). She has hypertensive nephropathy with stage 3 CKD. She hs severe lower extremity arthritis. She cannot walk up a flight of stairs due to knee pain and shortness of breath. She has no chest pain.  She has had an open cholecystectomy and appendectomy.  She takes baby ASA daily.   Her father and brother both died from pancreatic cancer. Her maternal aunt has a history of breast cancer.   On 09/30/17 she underwent robotic assisted total hysterectomy, BSO. Staging was not performed due to extreme morbid obesity.  Intraoperative frozen section of the ovary revealed a benign ovarian cyst and low grade endometrial cancer.   Final pathology revealed a benign ovarian cyst, however the endometrium contained a 4.5cm grade 1 endometrioid tumor with 16 of 40m (outer half) myometrial invasion. There was no LVSI. The tumor was MMR normal on IHC and MSI stable.  Due to her age (>70), this single high risk feature of deep myo invasion meant that she met high/intermediate risk factor criteria, and therefore adjuvant therapy with vaginal brachytherapy was recommended in accordance with NCCN guidelines.   She had SOB on exertion after surgery and was seen by her cardiologist, Dr PDorris Carneswho felt this was not cardiac related (normal cardiac testing).  She received  vaginal cuff brachytherapy with 5 fractions of a total of 30Gy delivered between 11/27/17 and 12/25/17.   Interval Hx:  She has no new symptoms or concerns for recurrence.   Current Meds:  Outpatient Encounter Medications as of 09/08/2019  Medication Sig    acetaminophen (TYLENOL) 500 MG tablet Take 2 tablets (1,000 mg total) by mouth every 12 (twelve) hours.   aspirin EC 81 MG tablet Take 81 mg by mouth every morning.   Carboxymethylcellul-Glycerin (LUBRICATING EYE DROPS OP) Place 1 drop into both eyes daily as needed (irritation).   cetirizine (ZYRTEC) 10 MG tablet Take 10 mg by mouth daily.    fenofibrate 160 MG tablet Take 160 mg by mouth every morning.    hydrocortisone cream 1 % Apply 1 application topically daily as needed for itching.   Insulin Degludec (TRESIBA FLEXTOUCH) 200 UNIT/ML SOPN Inject 35 Units into the skin daily with breakfast. On a normal day I take 90 units, if it gets in the 70s , I take 5 or 6 units less than the normal   loperamide (IMODIUM A-D) 2 MG tablet Take 4 mg by mouth as needed for diarrhea or loose stools.   Melatonin 5 MG TABS Take 5-10 mg by mouth at bedtime as needed (sleep).   metFORMIN (GLUCOPHAGE) 850 MG tablet Take 850 mg by mouth every evening.    omeprazole (PRILOSEC) 20 MG capsule Take 20 mg by mouth 2 (two) times daily.   prochlorperazine (COMPAZINE) 10 MG tablet Take 1 tablet (10 mg total) by mouth every 6 (six) hours as needed for nausea or vomiting.   simvastatin (ZOCOR) 20 MG tablet Take 20 mg by mouth every evening.   telmisartan-hydrochlorothiazide (MICARDIS HCT) 80-25 MG tablet Take 1 tablet by mouth daily.   traMADol (ULTRAM) 50 MG tablet Take 25-50 mg by mouth 2 (two) times daily as needed for severe pain.   [DISCONTINUED] RYBELSUS 3 MG TABS 3 mg.    No facility-administered encounter medications on file as of 09/08/2019.    Allergy:  Allergies  Allergen Reactions   Lipitor [Atorvastatin] Other (See Comments)    Leg cramps   Other Nausea And Vomiting    Pt states does not know drug but anesthesia drugs have made her have nausea and vomiting.    Sulfa Antibiotics     diarrhea   Potassium-Containing Compounds Rash    Social Hx:   Social History   Socioeconomic  History   Marital status: Married    Spouse name: Not on file   Number of children: Not on file   Years of education: Not on file   Highest education level: Not on file  Occupational History   Not on file  Tobacco Use   Smoking status: Never Smoker   Smokeless tobacco: Never Used  Substance and Sexual Activity   Alcohol use: No   Drug use: No   Sexual activity: Yes  Other Topics Concern   Not on file  Social History Narrative   Not on file   Social Determinants of Health   Financial Resource Strain:    Difficulty of Paying Living Expenses:   Food Insecurity:    Worried About Charity fundraiser in the Last Year:    Arboriculturist in the Last Year:   Transportation Needs:    Film/video editor (Medical):    Lack of Transportation (Non-Medical):   Physical Activity:    Days of Exercise per Week:    Minutes of Exercise per  Session:   Stress:    Feeling of Stress :   Social Connections:    Frequency of Communication with Friends and Family:    Frequency of Social Gatherings with Friends and Family:    Attends Religious Services:    Active Member of Clubs or Organizations:    Attends Music therapist:    Marital Status:   Intimate Partner Violence:    Fear of Current or Ex-Partner:    Emotionally Abused:    Physically Abused:    Sexually Abused:     Past Surgical Hx:  Past Surgical History:  Procedure Laterality Date   APPENDECTOMY  1972   CATARACT EXTRACTION W/PHACO Right 01/10/2014   Procedure: CATARACT EXTRACTION PHACO AND INTRAOCULAR LENS PLACEMENT ([place]);  Surgeon: Tonny Branch, MD;  Location: [facility];  Service: Ophthalmology;  Laterality: Right;  CDE:5.78   CATARACT EXTRACTION W/PHACO Left 01/27/2014   Procedure: CATARACT EXTRACTION PHACO AND INTRAOCULAR LENS PLACEMENT LEFT EYE CDE=5.75;  Surgeon: Tonny Branch, MD;  Location: [facility];  Service: Ophthalmology;  Laterality: Left;   CHOLECYSTECTOMY  1972    COLONOSCOPY WITH PROPOFOL N/A 09/01/2012   Procedure: COLONOSCOPY WITH PROPOFOL;  Surgeon: Garlan Fair, MD;  Location: [facility];  Service: Endoscopy;  Laterality: N/A;   KIDNEY STONE SURGERY     OMENTECTOMY  09/30/2017   Procedure: OMENTECTOMY;  Surgeon: [name], MD;  Location: [facility];  Service: Gynecology;;   ROBOTIC ASSISTED TOTAL HYSTERECTOMY WITH BILATERAL SALPINGO OOPHERECTOMY Bilateral 09/30/2017   Procedure: ROBOTIC ASSISTED TOTAL HYSTERECTOMY WITH BILATERAL SALPINGO OOPHORECTOMY;  Surgeon: [name], MD;  Location: [facility];  Service: Gynecology;  Laterality: Bilateral;    Past Medical Hx:  Past Medical History:  Diagnosis Date   Anxiety    Arthritis    "knees are bone on bone and i see dr. Tonita Cong for the cortisone injections "    Cellulitis    hx  of in the legs    Chronic kidney disease (CKD), stage III (moderate)    Complication of anesthesia    doesnt know the name of the anesthesia , but i was vomiting while i was still under    Diabetes mellitus without complication (HCC)    type 2    GERD (gastroesophageal reflux disease)    Glaucoma    Gout    History of blood transfusion    as child after accident, after childbirth/hemorroid surgery   History of kidney stones    Hyperlipidemia    Hypertension    Leukocytes in urine    Lumbar back pain    Sleep apnea    non adherant with cpap device     Past Gynecological History:  SVD x 2 No LMP recorded. Patient is postmenopausal.  Family Hx:  Family History  Problem Relation Age of Onset   Stroke Mother    Asthma Father    Colon cancer Father     Review of Systems:  Constitutional  Feels well,    ENT Normal appearing ears and nares bilaterally Skin/Breast  No rash, sores, jaundice, itching, dryness Cardiovascular  No chest pain, shortness of breath, or edema  Pulmonary  + SOB on exertion Gastro Intestinal  No nausea, vomitting, or diarrhoea. No bright red blood per rectum, no  abdominal pain, change in bowel movement, or constipation.  Genito Urinary  No frequency, urgency, dysuria, no bleeding Musculo Skeletal  + lower extremity joint pains Neurologic   No weakness, numbness, change in gait,  Psychology  No  depression, anxiety, insomnia.   Vitals:  Blood pressure (!) 134/59, pulse 84, temperature 98 F (36.7 C), temperature source Oral, resp. rate 17, height 5' 1"  (1.549 m), weight 202 lb (91.6 kg), SpO2 99 %.  Physical Exam: WD in NAD Neck  Supple NROM, without any enlargements.  Lymph Node Survey No cervical supraclavicular or inguinal adenopathy Cardiovascular  Pulse normal rate, regularity and rhythm. S1 and S2 normal.  Lungs  Clear to auscultation bilateraly, without wheezes/crackles/rhonchi. Good air movement.  Skin  No rash/lesions/breakdown  Psychiatry  Alert and oriented to person, place, and time  Abdomen  Normoactive bowel sounds, abdomen soft, non-tender and obese without evidence of hernia. Incisions with some crusting but no infection or cellulitis or drainage.  Back No CVA tenderness Genito Urinary  Vaginal cuff smooth and normal, no lesions or masses, in tact, no blood. + agglutination noted.  Rectal  deferred Extremities  No bilateral cyanosis, clubbing or edema.   Thereasa Solo, MD  09/08/2019, 2:57 PM

## 2019-09-08 NOTE — Patient Instructions (Signed)
Please notify Dr [name] at phone number [phone] if you notice vaginal bleeding, new pelvic or abdominal pains, bloating, feeling full easy, or a change in bladder or bowel function.   Please have Dr Clabe Seal office contact Dr Serita Grit office (at [phone]) in September to request an appointment with her for March, 2022.

## 2019-10-07 IMAGING — DX DG FOOT COMPLETE 3+V*R*
3 series · 3 of 3 positions shown · non-contrast
Comparison: None.

CLINICAL DATA: Right foot pain after injury several days ago.

EXAM:
RIGHT FOOT COMPLETE - 3+ VIEW

[foot ap]
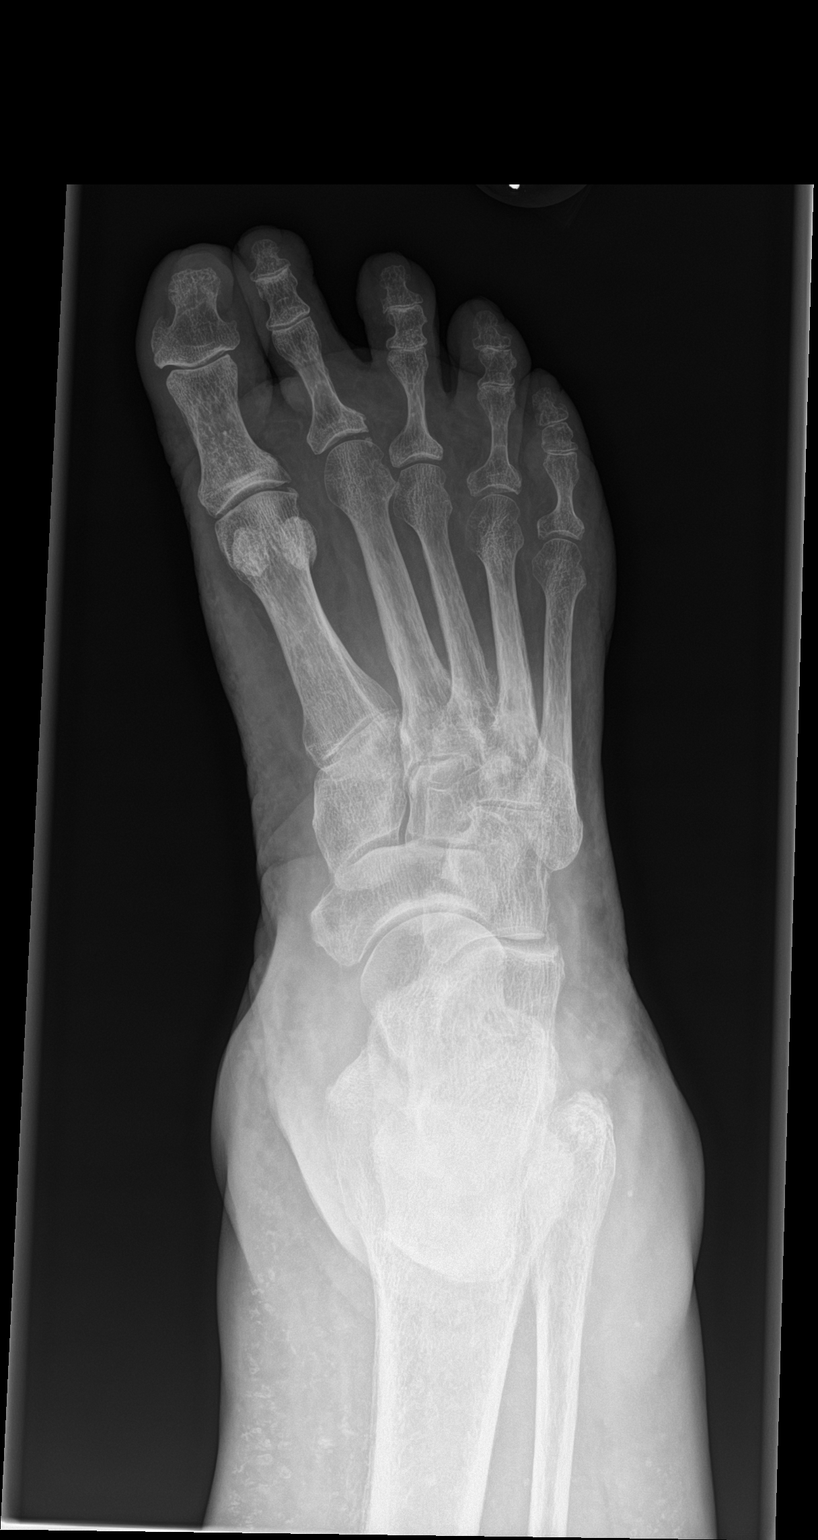

[foot obl]
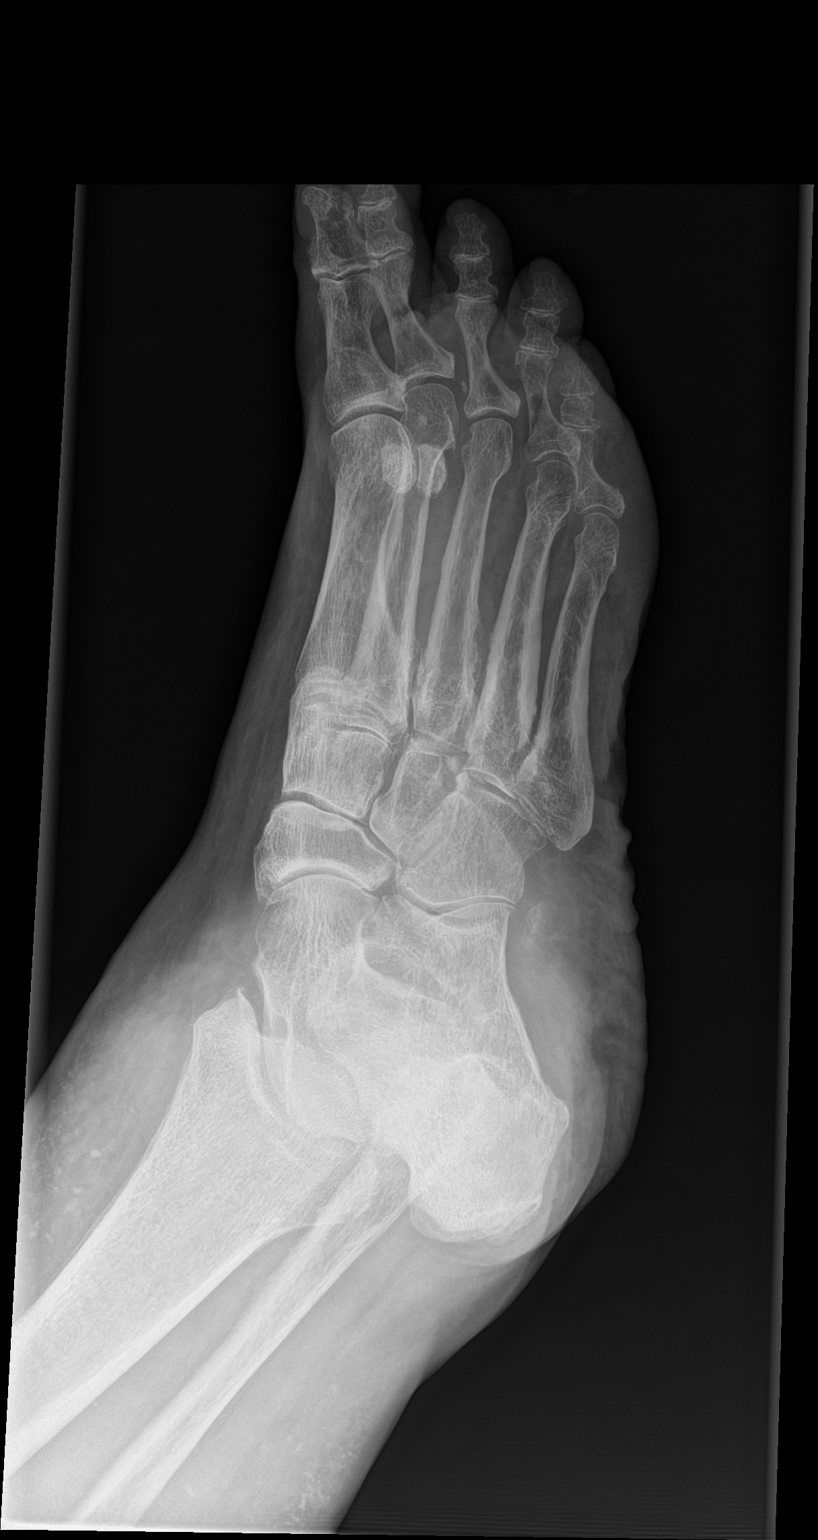

[foot lat]
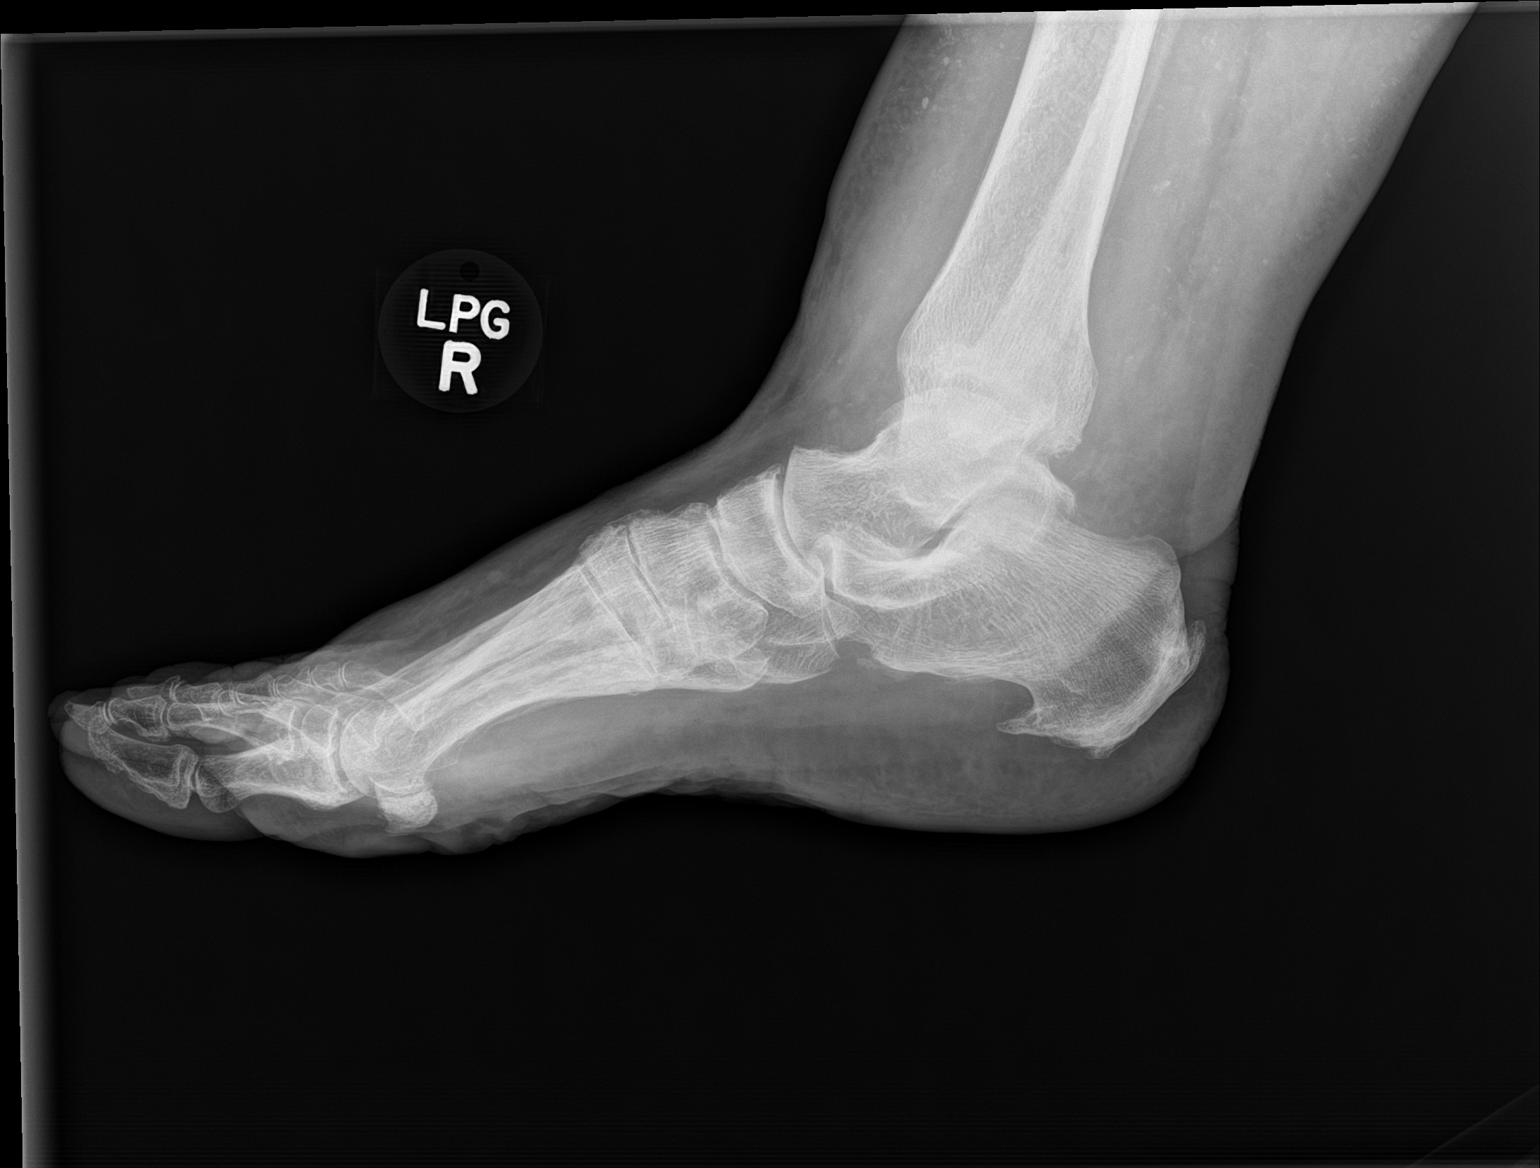

[3 of 3 positions shown; findings below may reference images not displayed]

FINDINGS: There is no evidence of fracture or dislocation. There is no
evidence of arthropathy. Mild posterior calcaneal spurring is noted.
Soft tissues are unremarkable.
IMPRESSION: No acute abnormality seen in the right foot.

## 2020-01-27 ENCOUNTER — Other Ambulatory Visit: Payer: Self-pay

## 2020-01-27 ENCOUNTER — Ambulatory Visit
Admission: RE | Admit: 2020-01-27 | Discharge: 2020-01-27 | Disposition: A | Discharge status: Home / Self Care | Payer: Medicare Other | Source: Ambulatory Visit | Attending: Radiation Oncology | Admitting: Radiation Oncology

## 2020-01-27 ENCOUNTER — Encounter: Payer: Self-pay | Admitting: Radiation Oncology

## 2020-01-27 VITALS — BP 161/57 | HR 79 | Temp 98.2°F | Resp 20 | Ht 61.0 in | Wt 219.2 lb

## 2020-01-27 DIAGNOSIS — Z923 Personal history of irradiation: Secondary | ICD-10-CM | POA: Insufficient documentation

## 2020-01-27 DIAGNOSIS — Z8542 Personal history of malignant neoplasm of other parts of uterus: Secondary | ICD-10-CM | POA: Diagnosis not present

## 2020-01-27 DIAGNOSIS — Z7982 Long term (current) use of aspirin: Secondary | ICD-10-CM | POA: Insufficient documentation

## 2020-01-27 DIAGNOSIS — Z79899 Other long term (current) drug therapy: Secondary | ICD-10-CM | POA: Insufficient documentation

## 2020-01-27 DIAGNOSIS — C541 Malignant neoplasm of endometrium: Secondary | ICD-10-CM

## 2020-01-27 NOTE — Progress Notes (Signed)
Patient here for a 6 month f/u visit with Dr. Sondra Come. Last radiation 2 years ago.Patient denies pain except for pain in her right ankle. Patient denies other sx.  BP (!) 161/57 (BP Location: Right Arm, Patient Position: Sitting, Cuff Size: Large)   Pulse 79   Temp 98.2 F (36.8 C)   Resp 20   Ht 5\' 1"  (1.549 m)   Wt 219 lb 3.2 oz (99.4 kg)   SpO2 100%   BMI 41.42 kg/m   Wt Readings from Last 3 Encounters:  01/27/20 219 lb 3.2 oz (99.4 kg)  09/08/19 202 lb (91.6 kg)  07/22/19 209 lb 3.2 oz (94.9 kg)

## 2020-01-27 NOTE — Progress Notes (Signed)
Radiation Oncology         [phone] ________________________________  Name: Elizabeth Crosby MRN: [id number]  Date: 01/27/2020  DOB: 12-Jun-1945  Follow-Up Visit Note  CC: [name], MD  [name], MD    ICD-10-CM   1. Endometrial cancer ([place])  C54.1     Diagnosis: Stage IB grade 1 Endometrioid Endometrial Adenocarcinoma (MSI stable, MMR normal)   Interval Since Last Radiation: Two years, one month, and two days  Radiation treatment dates:  11/27/2017, 12/04/2017, 12/11/2017, 12/18/2017, 12/25/2017  Site/dose:   Vaginal Cuff / 30 Gy in 5 fractions  Narrative:  The patient returns today for routine follow-up. She is doing well overall. She last saw Dr. [name] on 09/08/2019, during which time there was no evidence of recurrence.  On review of systems, she reports reports feeling well.  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 She denies vaginal bleeding pelvic pain or abdominal bloating.  She ambulates with the assistance of a walker in light of poor healing from her ankle fracture several months ago..   ALLERGIES:  is allergic to lipitor [atorvastatin], other, sulfa antibiotics, and potassium-containing compounds.  Meds: Current Outpatient Medications  Medication Sig Dispense Refill   acetaminophen (TYLENOL) 500 MG tablet Take 2 tablets (1,000 mg total) by mouth every 12 (twelve) hours. 30 tablet 0   aspirin EC 81 MG tablet Take 81 mg by mouth every morning.     Carboxymethylcellul-Glycerin (LUBRICATING EYE DROPS OP) Place 1 drop into both eyes daily as needed (irritation).     cetirizine (ZYRTEC) 10 MG tablet Take 10 mg by mouth daily.      fenofibrate 160 MG tablet Take 160 mg by mouth every morning.      HUMALOG KWIKPEN 200 UNIT/ML KwikPen Inject into the skin.     hydrocortisone cream 1 % Apply 1 application topically daily as needed for itching.     Insulin Degludec (TRESIBA FLEXTOUCH) 200 UNIT/ML SOPN Inject 35  Units into the skin daily with breakfast. On a normal day I take 90 units, if it gets in the 70s , I take 5 or 6 units less than the normal     loperamide (IMODIUM A-D) 2 MG tablet Take 4 mg by mouth as needed for diarrhea or loose stools.     Melatonin 5 MG TABS Take 5-10 mg by mouth at bedtime as needed (sleep).     omeprazole (PRILOSEC) 20 MG capsule Take 20 mg by mouth 2 (two) times daily.     prochlorperazine (COMPAZINE) 10 MG tablet Take 1 tablet (10 mg total) by mouth every 6 (six) hours as needed for nausea or vomiting. 30 tablet 0   simvastatin (ZOCOR) 20 MG tablet Take 20 mg by mouth every evening.     telmisartan-hydrochlorothiazide (MICARDIS HCT) 80-25 MG tablet Take 1 tablet by mouth daily.  0   traMADol (ULTRAM) 50 MG tablet Take 25-50 mg by mouth 2 (two) times daily as needed for severe pain.     No current facility-administered medications for this encounter.    Physical Findings: The patient is in no acute distress. Patient is alert and oriented.  height is _0  (1.549 m) and weight is 219 lb 3.2 oz (99.4 kg). Her temperature is 98.2 F (36.8 C). Her blood pressure is 161/57 (abnormal) and her pulse is 79. Her respiration is 20 and oxygen saturation is 100%.  Lungs are clear to auscultation bilaterally. Heart has regular rate and  rhythm. No palpable cervical, supraclavicular, or axillary adenopathy. Abdomen soft, non-tender, normal bowel sounds.  On pelvic examination the external genitalia were unremarkable. A speculum exam was performed. There are no mucosal lesions noted in the vaginal vault. On bimanual  examination there were no pelvic masses appreciated.  minimal agglutination noted.  Vaginal cuff intact.    Lab Findings: Lab Results  Component Value Date   WBC 10.6 (H) 10/01/2017   HGB 10.3 (L) 10/01/2017   HCT 31.3 (L) 10/01/2017   MCV 88.2 10/01/2017   PLT 185 10/01/2017    Radiographic Findings: No results found.  Impression: Stage IB grade 1  Endometrioid Endometrial Adenocarcinoma (MSI stable, MMR normal)   No evidence of recurrence on clinical exam today.  Plan: Given that she is now two years out from treatment, she can follow-up at 22-monthintervals.  the patient will follow up with Dr. RDenman Georgein six months and with radiation oncology in one year.  Total time spent in this encounter was 20 minutes which included reviewing the patient's most recent follow-up with Dr. RDenman George physical examination, and documentation. ____________________________________  JBlair Promise PhD, MD  This document serves as a record of services personally performed by JGery Pray MD. It was created on his behalf by MClerance Lav a trained medical scribe. The creation of this record is based on the scribe's personal observations and the provider's statements to them. This document has been checked and approved by the attending provider.

## 2020-03-19 MED FILL — SIMVASTATIN 20 MG TABLET: 90 days supply | Qty: 90 | Fill #1

## 2020-03-27 MED FILL — TELMISARTAN-HCTZ 80-25 MG TAB: 90 days supply | Qty: 90 | Fill #0

## 2020-05-03 MED FILL — FENOFIBRATE 160 MG TABLET: 90 days supply | Qty: 90 | Fill #1

## 2020-05-03 MED FILL — TRAMADOL HCL 50 MG TABLET: 30 days supply | Qty: 60 | Fill #0

## 2020-05-10 ENCOUNTER — Encounter (HOSPITAL_COMMUNITY): Payer: Self-pay

## 2020-05-10 ENCOUNTER — Emergency Department (HOSPITAL_COMMUNITY): Payer: No Typology Code available for payment source

## 2020-05-10 ENCOUNTER — Emergency Department (HOSPITAL_COMMUNITY)
Admission: EM | Admit: 2020-05-10 | Discharge: 2020-05-10 | Disposition: A | Discharge status: Home / Self Care | Payer: No Typology Code available for payment source | Attending: Emergency Medicine | Admitting: Emergency Medicine

## 2020-05-10 DIAGNOSIS — Z7982 Long term (current) use of aspirin: Secondary | ICD-10-CM | POA: Diagnosis not present

## 2020-05-10 DIAGNOSIS — I129 Hypertensive chronic kidney disease with stage 1 through stage 4 chronic kidney disease, or unspecified chronic kidney disease: Secondary | ICD-10-CM | POA: Insufficient documentation

## 2020-05-10 DIAGNOSIS — Y990 Civilian activity done for income or pay: Secondary | ICD-10-CM | POA: Diagnosis not present

## 2020-05-10 DIAGNOSIS — M542 Cervicalgia: Secondary | ICD-10-CM | POA: Insufficient documentation

## 2020-05-10 DIAGNOSIS — I6200 Nontraumatic subdural hemorrhage, unspecified: Secondary | ICD-10-CM

## 2020-05-10 DIAGNOSIS — R519 Headache, unspecified: Secondary | ICD-10-CM | POA: Diagnosis not present

## 2020-05-10 DIAGNOSIS — W07XXXA Fall from chair, initial encounter: Secondary | ICD-10-CM | POA: Diagnosis not present

## 2020-05-10 DIAGNOSIS — Z8542 Personal history of malignant neoplasm of other parts of uterus: Secondary | ICD-10-CM | POA: Diagnosis not present

## 2020-05-10 DIAGNOSIS — E1122 Type 2 diabetes mellitus with diabetic chronic kidney disease: Secondary | ICD-10-CM | POA: Insufficient documentation

## 2020-05-10 DIAGNOSIS — R6889 Other general symptoms and signs: Secondary | ICD-10-CM | POA: Diagnosis not present

## 2020-05-10 DIAGNOSIS — N183 Chronic kidney disease, stage 3 unspecified: Secondary | ICD-10-CM | POA: Diagnosis not present

## 2020-05-10 DIAGNOSIS — Z794 Long term (current) use of insulin: Secondary | ICD-10-CM | POA: Insufficient documentation

## 2020-05-10 DIAGNOSIS — Z743 Need for continuous supervision: Secondary | ICD-10-CM | POA: Diagnosis not present

## 2020-05-10 DIAGNOSIS — Z79899 Other long term (current) drug therapy: Secondary | ICD-10-CM | POA: Insufficient documentation

## 2020-05-10 DIAGNOSIS — S0990XA Unspecified injury of head, initial encounter: Secondary | ICD-10-CM | POA: Diagnosis not present

## 2020-05-10 DIAGNOSIS — W19XXXA Unspecified fall, initial encounter: Secondary | ICD-10-CM | POA: Diagnosis not present

## 2020-05-10 DIAGNOSIS — R52 Pain, unspecified: Secondary | ICD-10-CM | POA: Diagnosis not present

## 2020-05-10 NOTE — ED Triage Notes (Signed)
Pt presents with c/o fall that occurred at work. Pt does have a small knot to the back of her head, no bleeding. Pt fell backwards onto her head, no LOC. Pt is alert and oriented.

## 2020-05-10 NOTE — ED Provider Notes (Signed)
[facility] DEPT Provider Note   CSN: [id number] Arrival date & time: 05/10/20  1129     History Chief Complaint  Patient presents with  . Fall    Elizabeth Crosby is a 75 y.o. female.  Patient fell and hit the back of her head no loss of consciousness she fell off a chair.  The history is provided by the patient. No language interpreter was used.  Fall This is a new problem. The current episode started 3 to 5 hours ago. The problem occurs rarely. The problem has been resolved. Associated symptoms include headaches. Pertinent negatives include no chest pain and no abdominal pain. Nothing aggravates the symptoms. She has tried nothing for the symptoms.       Past Medical History:  Diagnosis Date  . Anxiety   . Arthritis    "knees are bone on bone and i see dr. Tonita Cong for the cortisone injections "   . Cellulitis    hx  of in the legs   . Chronic kidney disease (CKD), stage III (moderate) (HCC)   . Complication of anesthesia    doesnt know the name of the anesthesia , but i was vomiting while i was still under   . Diabetes mellitus without complication ([place])    type 2   . GERD (gastroesophageal reflux disease)   . Glaucoma   . Gout   . History of blood transfusion    as child after accident, after childbirth/hemorroid surgery  . History of kidney stones   . Hyperlipidemia   . Hypertension   . Leukocytes in urine   . Lumbar back pain   . Sleep apnea    non adherant with cpap device     Patient Active Problem List   Diagnosis Date Noted  . History of heart valve abnormality 10/24/2017  . Postmenopausal bleeding 09/30/2017  . Pelvic mass 09/30/2017  . Morbid obesity ([place]) 09/30/2017  . Endometrial cancer ([place]) 09/30/2017  . Pelvic mass in female 09/30/2017  . Acute rhinitis 07/04/2013  . Bronchitis, subacute 05/29/2013  . GERD (gastroesophageal reflux disease) 05/29/2013    Past Surgical History:  Procedure Laterality Date  .  APPENDECTOMY  1972  . CATARACT EXTRACTION W/PHACO Right 01/10/2014   Procedure: CATARACT EXTRACTION PHACO AND INTRAOCULAR LENS PLACEMENT ([place]);  Surgeon: Tonny Branch, MD;  Location: [facility];  Service: Ophthalmology;  Laterality: Right;  CDE:5.78  . CATARACT EXTRACTION W/PHACO Left 01/27/2014   Procedure: CATARACT EXTRACTION PHACO AND INTRAOCULAR LENS PLACEMENT LEFT EYE CDE=5.75;  Surgeon: Tonny Branch, MD;  Location: [facility];  Service: Ophthalmology;  Laterality: Left;  . CHOLECYSTECTOMY  1972  . COLONOSCOPY WITH PROPOFOL N/A 09/01/2012   Procedure: COLONOSCOPY WITH PROPOFOL;  Surgeon: Garlan Fair, MD;  Location: [facility];  Service: Endoscopy;  Laterality: N/A;  . KIDNEY STONE SURGERY    . OMENTECTOMY  09/30/2017   Procedure: OMENTECTOMY;  Surgeon: [name], MD;  Location: [facility];  Service: Gynecology;;  . ROBOTIC ASSISTED TOTAL HYSTERECTOMY WITH BILATERAL SALPINGO OOPHERECTOMY Bilateral 09/30/2017   Procedure: ROBOTIC ASSISTED TOTAL HYSTERECTOMY WITH BILATERAL SALPINGO OOPHORECTOMY;  Surgeon: [name], MD;  Location: [facility];  Service: Gynecology;  Laterality: Bilateral;     OB History   No obstetric history on file.     Family History  Problem Relation Age of Onset  . Stroke Mother        Cardiac condition  . Asthma Father   . Colon cancer Father  Social History   Tobacco Use  . Smoking status: Never Smoker  . Smokeless tobacco: Never Used  Vaping Use  . Vaping Use: Never used  Substance Use Topics  . Alcohol use: No  . Drug use: No    Home Medications Prior to Admission medications   Medication Sig Start Date End Date Taking? Authorizing Provider  acetaminophen (TYLENOL) 500 MG tablet Take 2 tablets (1,000 mg total) by mouth every 12 (twelve) hours. 10/01/17   [name], MD  aspirin EC 81 MG tablet Take 81 mg by mouth every morning.    [provider]  Carboxymethylcellul-Glycerin (LUBRICATING EYE DROPS OP) Place 1 drop into both eyes daily as needed  (irritation).    [provider]  cetirizine (ZYRTEC) 10 MG tablet Take 10 mg by mouth daily.     [provider]  fenofibrate 160 MG tablet Take 160 mg by mouth every morning.     [provider]  HUMALOG KWIKPEN 200 UNIT/ML KwikPen Inject into the skin. 11/18/19   [provider]  hydrocortisone cream 1 % Apply 1 application topically daily as needed for itching.    [provider]  Insulin Degludec (TRESIBA FLEXTOUCH) 200 UNIT/ML SOPN Inject 35 Units into the skin daily with breakfast. On a normal day I take 90 units, if it gets in the 70s , I take 5 or 6 units less than the normal    [provider]  loperamide (IMODIUM A-D) 2 MG tablet Take 4 mg by mouth as needed for diarrhea or loose stools.    [provider]  Melatonin 5 MG TABS Take 5-10 mg by mouth at bedtime as needed (sleep).    [provider]  omeprazole (PRILOSEC) 20 MG capsule Take 20 mg by mouth 2 (two) times daily. 08/19/18   [provider]  prochlorperazine (COMPAZINE) 10 MG tablet Take 1 tablet (10 mg total) by mouth every 6 (six) hours as needed for nausea or vomiting. 12/18/17   Gery Pray, MD  simvastatin (ZOCOR) 20 MG tablet Take 20 mg by mouth every evening.    [provider]  telmisartan-hydrochlorothiazide (MICARDIS HCT) 80-25 MG tablet Take 1 tablet by mouth daily. 01/06/18   [provider]  traMADol (ULTRAM) 50 MG tablet Take 25-50 mg by mouth 2 (two) times daily as needed for severe pain.    [provider]    Allergies    Lipitor [atorvastatin], Other, Sulfa antibiotics, and Potassium-containing compounds  Review of Systems   Review of Systems  Constitutional: Negative for appetite change and fatigue.  HENT: Negative for congestion, ear discharge and sinus pressure.   Eyes: Negative for discharge.  Respiratory: Negative for cough.   Cardiovascular: Negative for chest pain.  Gastrointestinal: Negative  for abdominal pain and diarrhea.  Genitourinary: Negative for frequency and hematuria.  Musculoskeletal: Negative for back pain.  Skin: Negative for rash.  Neurological: Positive for headaches. Negative for seizures.  Psychiatric/Behavioral: Negative for hallucinations.    Physical Exam Updated Vital Signs BP (!) 151/52   Pulse 75   Temp 97.8 F (36.6 C) (Oral)   Resp 18   SpO2 100%   Physical Exam Vitals and nursing note reviewed.  Constitutional:      Appearance: She is well-developed.  HENT:     Head: Normocephalic.     Comments: Tender occipital head and superior neck mild    Nose: Nose normal.  Eyes:     General: No scleral icterus.  Extraocular Movements: EOM normal.     Conjunctiva/sclera: Conjunctivae normal.  Neck:     Thyroid: No thyromegaly.  Cardiovascular:     Rate and Rhythm: Normal rate and regular rhythm.     Heart sounds: No murmur heard. No friction rub. No gallop.   Pulmonary:     Breath sounds: No stridor. No wheezing or rales.  Chest:     Chest wall: No tenderness.  Abdominal:     General: There is no distension.     Tenderness: There is no abdominal tenderness. There is no rebound.  Musculoskeletal:        General: No edema. Normal range of motion.     Cervical back: Neck supple.  Lymphadenopathy:     Cervical: No cervical adenopathy.  Skin:    Findings: No erythema or rash.  Neurological:     Mental Status: She is alert and oriented to person, place, and time.     Motor: No abnormal muscle tone.     Coordination: Coordination normal.  Psychiatric:        Mood and Affect: Mood and affect normal.        Behavior: Behavior normal.     ED Results / Procedures / Treatments   Labs (all labs ordered are listed, but only abnormal results are displayed) Labs Reviewed - No data to display  EKG None  Radiology No results found.  Procedures Procedures   Medications Ordered in ED Medications - No data to display  ED Course  I  have reviewed the triage vital signs and the nursing notes.  Pertinent labs & imaging results that were available during my care of the patient were reviewed by me and considered in my medical decision making (see chart for details).    MDM Rules/Calculators/A&P                         Patient with a small subdural.  Neurosurgery will be consulted  Final Clinical Impression(s) / ED Diagnoses Final diagnoses:  None    Rx / DC Orders ED Discharge Orders    None       Milton Ferguson, MD 05/14/20 1052

## 2020-05-10 NOTE — ED Provider Notes (Signed)
Case discussed with neurosurgery who reviewed the images.  Recommending outpatient follow-up.  Patient given phone number to neurosurgery clinic advised follow-up if she develops any additional symptoms.  Advised immediate return for any headache or vomiting or any additional concerns.  Otherwise follow-up with neurosurgery via phone call within 3 to 4 days.     Luna Fuse, MD 05/10/20 [phone]

## 2020-05-10 NOTE — Discharge Instructions (Addendum)
Call neurosurgery for follow-up.  They recommend you call the office if you have any additional symptoms such as nausea or headaches or vomiting. Return immediately back to the ER if:  Your symptoms worsen within the next 12-24 hours. You develop new symptoms such as new fevers, persistent vomiting, new pain, shortness of breath, or new weakness or numbness, or if you have any other concerns.

## 2020-05-10 NOTE — Progress Notes (Signed)
Patient ID: Elizabeth Crosby, female   DOB: 12/03/1945, 75 y.o.   MRN: [id number] BP (!) 151/52   Pulse 75   Temp 97.8 F (36.6 C) (Oral)   Resp 18   SpO2 100%  I have reviewed the films. This falcine subdural is quite small and highly unlikely to develop into a larger mass. She is ok to be discharged if eating, and no evidence of nausea and vomiting. No need for follow up. If there are neurological changes she can call our office. [phone]

## 2020-05-20 MED FILL — HUMALOG 200 UNIT/ML KWIKPEN: 26 days supply | Qty: 6 | Fill #1

## 2020-05-24 DIAGNOSIS — R42 Dizziness and giddiness: Secondary | ICD-10-CM | POA: Diagnosis not present

## 2020-05-30 DIAGNOSIS — Z961 Presence of intraocular lens: Secondary | ICD-10-CM | POA: Diagnosis not present

## 2020-05-30 DIAGNOSIS — E119 Type 2 diabetes mellitus without complications: Secondary | ICD-10-CM | POA: Diagnosis not present

## 2020-05-30 DIAGNOSIS — H26492 Other secondary cataract, left eye: Secondary | ICD-10-CM | POA: Diagnosis not present

## 2020-05-30 DIAGNOSIS — H43813 Vitreous degeneration, bilateral: Secondary | ICD-10-CM | POA: Diagnosis not present

## 2020-06-06 MED FILL — TRESIBA FLEXTOUCH 200 UNIT/ML: 90 days supply | Qty: 18 | Fill #0

## 2020-06-17 MED FILL — SIMVASTATIN 20 MG TABLET: 90 days supply | Qty: 90 | Fill #2

## 2020-06-20 ENCOUNTER — Telehealth: Payer: Self-pay | Admitting: *Deleted

## 2020-06-20 NOTE — Telephone Encounter (Signed)
CALLED PATIENT TO INFORM OF FU APPT. WITH DR. [name] ON 07-25-20 - ARRIVAL TIME- 2:30 PM, LVM FOR A RETURN CALL

## 2020-06-21 ENCOUNTER — Telehealth: Payer: Self-pay | Admitting: *Deleted

## 2020-06-21 NOTE — Telephone Encounter (Signed)
Patient called to confirm her appt in April with Dr [name]. Gave the patient the appt date/time of April 26 th at 3 pm

## 2020-07-19 ENCOUNTER — Encounter: Payer: Self-pay | Admitting: Gynecologic Oncology

## 2020-07-25 ENCOUNTER — Other Ambulatory Visit: Payer: Self-pay

## 2020-07-25 ENCOUNTER — Inpatient Hospital Stay: Payer: Medicare Other | Attending: Gynecologic Oncology | Admitting: Gynecologic Oncology

## 2020-07-25 VITALS — BP 159/60 | HR 92 | Temp 97.0°F | Resp 17 | Ht 61.0 in | Wt 230.2 lb

## 2020-07-25 DIAGNOSIS — Z79899 Other long term (current) drug therapy: Secondary | ICD-10-CM | POA: Insufficient documentation

## 2020-07-25 DIAGNOSIS — Z7984 Long term (current) use of oral hypoglycemic drugs: Secondary | ICD-10-CM | POA: Diagnosis not present

## 2020-07-25 DIAGNOSIS — I129 Hypertensive chronic kidney disease with stage 1 through stage 4 chronic kidney disease, or unspecified chronic kidney disease: Secondary | ICD-10-CM | POA: Diagnosis not present

## 2020-07-25 DIAGNOSIS — E1122 Type 2 diabetes mellitus with diabetic chronic kidney disease: Secondary | ICD-10-CM | POA: Diagnosis not present

## 2020-07-25 DIAGNOSIS — Z90722 Acquired absence of ovaries, bilateral: Secondary | ICD-10-CM | POA: Diagnosis not present

## 2020-07-25 DIAGNOSIS — C541 Malignant neoplasm of endometrium: Secondary | ICD-10-CM | POA: Diagnosis not present

## 2020-07-25 DIAGNOSIS — N183 Chronic kidney disease, stage 3 unspecified: Secondary | ICD-10-CM | POA: Insufficient documentation

## 2020-07-25 DIAGNOSIS — Z90710 Acquired absence of both cervix and uterus: Secondary | ICD-10-CM | POA: Diagnosis not present

## 2020-07-25 DIAGNOSIS — Z7982 Long term (current) use of aspirin: Secondary | ICD-10-CM | POA: Diagnosis not present

## 2020-07-25 NOTE — Patient Instructions (Signed)
Please notify Dr Dexter Sauser at phone number [phone] if you notice vaginal bleeding, new pelvic or abdominal pains, bloating, feeling full easy, or a change in bladder or bowel function.   Please have Dr Kinard's office contact Dr Almee Pelphrey's office (at [phone]) in October after your appointment with him to request an appointment with Dr Mariadelosang Wynns for April, 2023.  

## 2020-07-25 NOTE — Progress Notes (Signed)
Follow-up Note: Gyn-Onc  Consult was initially requested by Dr. Simona Huh for the evaluation of Elizabeth Crosby 75 y.o. female  CC:  Chief Complaint  Patient presents with  . Endometrial cancer [facility])    Assessment/Plan:  Elizabeth. Elizabeth Crosby  is a 75 y.o.  year old a stage IB grade 1endometrioid endometrial adenocarcinoma (MSI stable, MMR normal).  Pathology revealed high/intermediate risk factors for recurrence, therefore vaginal brachytherapy adjuvant therapy was recommended according to NCCN guidelines. It was completed in September, 2019.  I discussed risk for recurrence and typical symptoms encouraged her to notify us of these should they develop between visits.  I recommend she continue to have follow-up every 3 months for 2 years (until September, 2021) in accordance with NCCN guidelines. If at that time she remains cancer free we will space visits to every 6 months until she is 5 years out from completion of therapy (September, 2024). Those visits should include symptom assessment, physical exam and pelvic examination. Pap smears are not indicated or recommended in the routine surveillance of endometrial cancer  HPI: Elizabeth Crosby is a 75 year old P2 who is seen in consultation at the request of Dr Simona Huh for a right adnexal mass, elevated CA 125, postmenopausal bleeding, AGUS pap.  The patient is morbidly obese and has a history of 2 prior vaginal deliveries remotely. She is postmenopausal for approximately 20 years. She has always had normal paps.  In May, 2019 she developed vaginal bleeding. She was seen by Dr Simona Huh on 09/03/17 and a TVUS was performed which revealed a uterus measuring 7.1 x 4.4x3.1cm with a thickened endometrium on 2cm. The endometrium was heterogeneous and had internal areas of associated vascularity.  The right ovary was not well delineated and contacted a complex cystic lesion measuring 4x3.7x3.3cm. There were a few scattered internal septations without  appreciable vascularity.   Pap smear from 09/03/17 showed AGUS with HPV not detected.  CA 125 on 6/4//19 was 74.1  Endometrial biopsy on 09/03/17 showed fragments of benign endometrial tissue with eosinophilic change and papillary metaplasia and no atypia, hyperplasia or malignancy.    She reports a history of morbid obesity, hypertension (with associated LVH, last echo in 2015 showed EF of 60%). She has hypertensive nephropathy with stage 3 CKD. She hs severe lower extremity arthritis. She cannot walk up a flight of stairs due to knee pain and shortness of breath. She has no chest pain.  She has had an open cholecystectomy and appendectomy.  She takes baby ASA daily.   Her father and brother both died from pancreatic cancer. Her maternal aunt has a history of breast cancer.   On 09/30/17 she underwent robotic assisted total hysterectomy, BSO. Staging was not performed due to extreme morbid obesity.  Intraoperative frozen section of the ovary revealed a benign ovarian cyst and low grade endometrial cancer.   Final pathology revealed a benign ovarian cyst, however the endometrium contained a 4.5cm grade 1 endometrioid tumor with 16 of 10m (outer half) myometrial invasion. There was no LVSI. The tumor was MMR normal on IHC and MSI stable.  Due to her age (>70), this single high risk feature of deep myo invasion meant that she met high/intermediate risk factor criteria, and therefore adjuvant therapy with vaginal brachytherapy was recommended in accordance with NCCN guidelines.   She had SOB on exertion after surgery and was seen by her cardiologist, Dr PDorris Carneswho felt this was not cardiac related (normal cardiac testing).  She received  vaginal cuff brachytherapy with 5 fractions of a total of 30Gy delivered between 11/27/17 and 12/25/17.   Interval Hx:  She has no new symptoms or concerns for recurrence.   Current Meds:  Outpatient Encounter Medications as of 07/25/2020  Medication Sig   . acetaminophen (TYLENOL) 500 MG tablet Take 2 tablets (1,000 mg total) by mouth every 12 (twelve) hours.  Marland Kitchen aspirin EC 81 MG tablet Take 81 mg by mouth every morning.  . Carboxymethylcellul-Glycerin (LUBRICATING EYE DROPS OP) Place 1 drop into both eyes daily as needed (irritation).  . cetirizine (ZYRTEC) 10 MG tablet Take 10 mg by mouth daily.   . fenofibrate 160 MG tablet Take 160 mg by mouth every morning.   Marland Kitchen HUMALOG KWIKPEN 200 UNIT/ML KwikPen Inject 10 Units into the skin daily with supper.  . hydrocortisone cream 1 % Apply 1 application topically daily as needed for itching.  . insulin degludec (TRESIBA) 200 UNIT/ML FlexTouch Pen Inject 35 Units into the skin daily with breakfast. On a normal day I take 90 units, if it gets in the 70s , I take 5 or 6 units less than the normal  . loperamide (IMODIUM A-D) 2 MG tablet Take 4 mg by mouth as needed for diarrhea or loose stools.  . Melatonin 5 MG TABS Take 5-10 mg by mouth at bedtime as needed (sleep).  Marland Kitchen omeprazole (PRILOSEC) 20 MG capsule Take 20 mg by mouth daily.  . simvastatin (ZOCOR) 20 MG tablet Take 20 mg by mouth every evening.  Marland Kitchen telmisartan-hydrochlorothiazide (MICARDIS HCT) 80-25 MG tablet Take 1 tablet by mouth daily.  . traMADol (ULTRAM) 50 MG tablet Take 25-50 mg by mouth 2 (two) times daily as needed for severe pain.  Marland Kitchen prochlorperazine (COMPAZINE) 10 MG tablet Take 1 tablet (10 mg total) by mouth every 6 (six) hours as needed for nausea or vomiting. (Patient not taking: Reported on 07/19/2020)   No facility-administered encounter medications on file as of 07/25/2020.    Allergy:  Allergies  Allergen Reactions  . Ozempic (0.25 Or 0.5 Mg-Dose) [Semaglutide(0.25 Or 0.60m-Dos)] Nausea And Vomiting  . Amoxicillin     Other reaction(s): cellulitis  . Canagliflozin     Other reaction(s): UTI  . Dulaglutide Other (See Comments)    GI Upset  . Lipitor [Atorvastatin] Other (See Comments)    Leg cramps  . Metformin Other (See  Comments)    Diarrhea   . Other Nausea And Vomiting    Pt states does not know drug but anesthesia drugs have made her have nausea and vomiting.   . Pregabalin Er     Other reaction(s): dizziness  . Sulfa Antibiotics     diarrhea  . Potassium-Containing Compounds Rash    Social Hx:   Social History   Socioeconomic History  . Marital status: Married    Spouse name: Not on file  . Number of children: Not on file  . Years of education: Not on file  . Highest education level: Not on file  Occupational History  . Not on file  Tobacco Use  . Smoking status: Never Smoker  . Smokeless tobacco: Never Used  Vaping Use  . Vaping Use: Never used  Substance and Sexual Activity  . Alcohol use: No  . Drug use: No  . Sexual activity: Yes  Other Topics Concern  . Not on file  Social History Narrative  . Not on file   Social Determinants of Health   Financial Resource Strain: Not on file  Food Insecurity: Not on file  Transportation Needs: Not on file  Physical Activity: Not on file  Stress: Not on file  Social Connections: Not on file  Intimate Partner Violence: Not on file    Past Surgical Hx:  Past Surgical History:  Procedure Laterality Date  . APPENDECTOMY  1972  . CATARACT EXTRACTION W/PHACO Right 01/10/2014   Procedure: CATARACT EXTRACTION PHACO AND INTRAOCULAR LENS PLACEMENT ([place]);  Surgeon: Tonny Branch, MD;  Location: [facility];  Service: Ophthalmology;  Laterality: Right;  CDE:5.78  . CATARACT EXTRACTION W/PHACO Left 01/27/2014   Procedure: CATARACT EXTRACTION PHACO AND INTRAOCULAR LENS PLACEMENT LEFT EYE CDE=5.75;  Surgeon: Tonny Branch, MD;  Location: [facility];  Service: Ophthalmology;  Laterality: Left;  . CHOLECYSTECTOMY  1972  . COLONOSCOPY WITH PROPOFOL N/A 09/01/2012   Procedure: COLONOSCOPY WITH PROPOFOL;  Surgeon: Garlan Fair, MD;  Location: [facility];  Service: Endoscopy;  Laterality: N/A;  . KIDNEY STONE SURGERY    . OMENTECTOMY  09/30/2017   Procedure:  OMENTECTOMY;  Surgeon: [name], MD;  Location: [facility];  Service: Gynecology;;  . ROBOTIC ASSISTED TOTAL HYSTERECTOMY WITH BILATERAL SALPINGO OOPHERECTOMY Bilateral 09/30/2017   Procedure: ROBOTIC ASSISTED TOTAL HYSTERECTOMY WITH BILATERAL SALPINGO OOPHORECTOMY;  Surgeon: [name], MD;  Location: [facility];  Service: Gynecology;  Laterality: Bilateral;    Past Medical Hx:  Past Medical History:  Diagnosis Date  . Anxiety   . Arthritis    "knees are bone on bone and i see dr. Tonita Cong for the cortisone injections "   . Cellulitis    hx  of in the legs   . Chronic kidney disease (CKD), stage III (moderate) (HCC)   . Complication of anesthesia    doesnt know the name of the anesthesia , but i was vomiting while i was still under   . Diabetes mellitus without complication ([place])    type 2   . GERD (gastroesophageal reflux disease)   . Glaucoma   . Gout   . History of blood transfusion    as child after accident, after childbirth/hemorroid surgery  . History of kidney stones   . Hyperlipidemia   . Hypertension   . Leukocytes in urine   . Lumbar back pain   . Sleep apnea    non adherant with cpap device     Past Gynecological History:  SVD x 2 No LMP recorded. Patient is postmenopausal.  Family Hx:  Family History  Problem Relation Age of Onset  . Stroke Mother        Cardiac condition  . Asthma Father   . Colon cancer Father     Review of Systems:  Constitutional  Feels well,    ENT Normal appearing ears and nares bilaterally Skin/Breast  No rash, sores, jaundice, itching, dryness Cardiovascular  No chest pain, shortness of breath, or edema  Pulmonary  + SOB on exertion Gastro Intestinal  No nausea, vomitting, or diarrhoea. No bright red blood per rectum, no abdominal pain, change in bowel movement, or constipation.  Genito Urinary  No frequency, urgency, dysuria, no bleeding Musculo Skeletal  + lower extremity joint pains Neurologic   No weakness, numbness,  change in gait,  Psychology  No depression, anxiety, insomnia.   Vitals:  Blood pressure (!) 159/60, pulse 92, temperature (!) 97 F (36.1 C), temperature source Tympanic, resp. rate 17, height 5' 1"  (1.549 m), weight 230 lb 3.2 oz (104.4 kg), SpO2 99 %.  Physical Exam: WD in NAD Neck  Supple  NROM, without any enlargements.  Lymph Node Survey No cervical supraclavicular or inguinal adenopathy Cardiovascular  Pulse normal rate, regularity and rhythm. S1 and S2 normal.  Lungs  Clear to auscultation bilateraly, without wheezes/crackles/rhonchi. Good air movement.  Skin  No rash/lesions/breakdown  Psychiatry  Alert and oriented to person, place, and time  Abdomen  Normoactive bowel sounds, abdomen soft, non-tender and obese without evidence of hernia. Incisions with some crusting but no infection or cellulitis or drainage.  Back No CVA tenderness Genito Urinary  Vaginal cuff smooth and normal, no lesions or masses, in tact, no blood. + agglutination noted.  Rectal  deferred Extremities  No bilateral cyanosis, clubbing or edema.   Thereasa Solo, MD  07/25/2020, 3:02 PM

## 2020-08-03 DIAGNOSIS — S8001XA Contusion of right knee, initial encounter: Secondary | ICD-10-CM | POA: Diagnosis not present

## 2020-08-03 DIAGNOSIS — S20212A Contusion of left front wall of thorax, initial encounter: Secondary | ICD-10-CM | POA: Diagnosis not present

## 2020-08-03 DIAGNOSIS — W010XXA Fall on same level from slipping, tripping and stumbling without subsequent striking against object, initial encounter: Secondary | ICD-10-CM | POA: Diagnosis not present

## 2020-08-03 DIAGNOSIS — S2232XA Fracture of one rib, left side, initial encounter for closed fracture: Secondary | ICD-10-CM | POA: Diagnosis not present

## 2020-08-03 DIAGNOSIS — L089 Local infection of the skin and subcutaneous tissue, unspecified: Secondary | ICD-10-CM | POA: Diagnosis not present

## 2020-08-03 MED FILL — DOXYCYCLINE HYCLATE 100 MG CAP: 7 days supply | Qty: 14 | Fill #0

## 2020-08-04 MED FILL — FENOFIBRATE 160 MG TABLET: 90 days supply | Qty: 90 | Fill #2

## 2020-08-16 DIAGNOSIS — L03115 Cellulitis of right lower limb: Secondary | ICD-10-CM | POA: Diagnosis not present

## 2020-08-16 DIAGNOSIS — S2232XD Fracture of one rib, left side, subsequent encounter for fracture with routine healing: Secondary | ICD-10-CM | POA: Diagnosis not present

## 2020-08-16 DIAGNOSIS — R197 Diarrhea, unspecified: Secondary | ICD-10-CM | POA: Diagnosis not present

## 2020-08-16 DIAGNOSIS — S8001XD Contusion of right knee, subsequent encounter: Secondary | ICD-10-CM | POA: Diagnosis not present

## 2020-08-16 MED FILL — ONDANSETRON ODT 4 MG TABLET: 30 days supply | Qty: 20 | Fill #0

## 2020-08-17 DIAGNOSIS — R197 Diarrhea, unspecified: Secondary | ICD-10-CM | POA: Diagnosis not present

## 2020-08-17 DIAGNOSIS — S8011XA Contusion of right lower leg, initial encounter: Secondary | ICD-10-CM | POA: Diagnosis not present

## 2020-08-18 ENCOUNTER — Other Ambulatory Visit: Payer: Self-pay | Admitting: Otolaryngology

## 2020-08-18 DIAGNOSIS — IMO0001 Reserved for inherently not codable concepts without codable children: Secondary | ICD-10-CM

## 2020-08-21 MED FILL — FLORASTOR 250 MG CAPSULE: 25 days supply | Qty: 100 | Fill #0

## 2020-08-25 MED FILL — TRAMADOL HCL 50 MG TABLET: 30 days supply | Qty: 60 | Fill #0

## 2020-08-30 DIAGNOSIS — R8290 Unspecified abnormal findings in urine: Secondary | ICD-10-CM | POA: Diagnosis not present

## 2020-08-30 DIAGNOSIS — H409 Unspecified glaucoma: Secondary | ICD-10-CM | POA: Diagnosis not present

## 2020-08-30 DIAGNOSIS — J309 Allergic rhinitis, unspecified: Secondary | ICD-10-CM | POA: Diagnosis not present

## 2020-08-30 DIAGNOSIS — Z0000 Encounter for general adult medical examination without abnormal findings: Secondary | ICD-10-CM | POA: Diagnosis not present

## 2020-08-30 DIAGNOSIS — R197 Diarrhea, unspecified: Secondary | ICD-10-CM | POA: Diagnosis not present

## 2020-08-30 DIAGNOSIS — N183 Chronic kidney disease, stage 3 unspecified: Secondary | ICD-10-CM | POA: Diagnosis not present

## 2020-08-30 DIAGNOSIS — E1122 Type 2 diabetes mellitus with diabetic chronic kidney disease: Secondary | ICD-10-CM | POA: Diagnosis not present

## 2020-08-30 DIAGNOSIS — G4733 Obstructive sleep apnea (adult) (pediatric): Secondary | ICD-10-CM | POA: Diagnosis not present

## 2020-08-30 DIAGNOSIS — I129 Hypertensive chronic kidney disease with stage 1 through stage 4 chronic kidney disease, or unspecified chronic kidney disease: Secondary | ICD-10-CM | POA: Diagnosis not present

## 2020-08-30 DIAGNOSIS — K219 Gastro-esophageal reflux disease without esophagitis: Secondary | ICD-10-CM | POA: Diagnosis not present

## 2020-08-30 DIAGNOSIS — E782 Mixed hyperlipidemia: Secondary | ICD-10-CM | POA: Diagnosis not present

## 2020-08-30 MED FILL — OMEPRAZOLE DR 20 MG CAPSULE: 90 days supply | Qty: 90 | Fill #0

## 2020-08-31 DIAGNOSIS — S8011XA Contusion of right lower leg, initial encounter: Secondary | ICD-10-CM | POA: Diagnosis not present

## 2020-09-04 MED FILL — FOSFOMYCIN 3 GM SACHET: 1 days supply | Qty: 1 | Fill #0

## 2020-09-06 DIAGNOSIS — R197 Diarrhea, unspecified: Secondary | ICD-10-CM | POA: Diagnosis not present

## 2020-09-12 DIAGNOSIS — N179 Acute kidney failure, unspecified: Secondary | ICD-10-CM | POA: Diagnosis not present

## 2020-09-13 MED FILL — SIMVASTATIN 20 MG TABLET: 90 days supply | Qty: 90 | Fill #0

## 2020-10-13 DIAGNOSIS — I129 Hypertensive chronic kidney disease with stage 1 through stage 4 chronic kidney disease, or unspecified chronic kidney disease: Secondary | ICD-10-CM | POA: Diagnosis not present

## 2020-11-17 DIAGNOSIS — I129 Hypertensive chronic kidney disease with stage 1 through stage 4 chronic kidney disease, or unspecified chronic kidney disease: Secondary | ICD-10-CM | POA: Diagnosis not present

## 2020-11-18 ENCOUNTER — Observation Stay (HOSPITAL_COMMUNITY): Payer: Medicare Other

## 2020-11-18 ENCOUNTER — Emergency Department (HOSPITAL_COMMUNITY): Payer: Medicare Other

## 2020-11-18 ENCOUNTER — Inpatient Hospital Stay (HOSPITAL_COMMUNITY)
Admission: EM | Admit: 2020-11-18 | Discharge: 2020-11-25 | DRG: 193 | Disposition: A | Discharge status: Skilled Nursing Facility | Payer: Medicare Other | Attending: Internal Medicine | Admitting: Internal Medicine

## 2020-11-18 DIAGNOSIS — R4182 Altered mental status, unspecified: Secondary | ICD-10-CM | POA: Diagnosis present

## 2020-11-18 DIAGNOSIS — M170 Bilateral primary osteoarthritis of knee: Secondary | ICD-10-CM | POA: Diagnosis present

## 2020-11-18 DIAGNOSIS — M109 Gout, unspecified: Secondary | ICD-10-CM | POA: Diagnosis not present

## 2020-11-18 DIAGNOSIS — R29810 Facial weakness: Secondary | ICD-10-CM | POA: Diagnosis not present

## 2020-11-18 DIAGNOSIS — E46 Unspecified protein-calorie malnutrition: Secondary | ICD-10-CM | POA: Diagnosis not present

## 2020-11-18 DIAGNOSIS — Z825 Family history of asthma and other chronic lower respiratory diseases: Secondary | ICD-10-CM | POA: Diagnosis not present

## 2020-11-18 DIAGNOSIS — Z923 Personal history of irradiation: Secondary | ICD-10-CM

## 2020-11-18 DIAGNOSIS — G9341 Metabolic encephalopathy: Secondary | ICD-10-CM | POA: Diagnosis not present

## 2020-11-18 DIAGNOSIS — E11649 Type 2 diabetes mellitus with hypoglycemia without coma: Secondary | ICD-10-CM | POA: Diagnosis present

## 2020-11-18 DIAGNOSIS — I129 Hypertensive chronic kidney disease with stage 1 through stage 4 chronic kidney disease, or unspecified chronic kidney disease: Secondary | ICD-10-CM | POA: Diagnosis not present

## 2020-11-18 DIAGNOSIS — I639 Cerebral infarction, unspecified: Secondary | ICD-10-CM | POA: Diagnosis not present

## 2020-11-18 DIAGNOSIS — Z7982 Long term (current) use of aspirin: Secondary | ICD-10-CM

## 2020-11-18 DIAGNOSIS — R569 Unspecified convulsions: Secondary | ICD-10-CM | POA: Insufficient documentation

## 2020-11-18 DIAGNOSIS — I6522 Occlusion and stenosis of left carotid artery: Secondary | ICD-10-CM | POA: Diagnosis not present

## 2020-11-18 DIAGNOSIS — K219 Gastro-esophageal reflux disease without esophagitis: Secondary | ICD-10-CM | POA: Diagnosis not present

## 2020-11-18 DIAGNOSIS — Z8542 Personal history of malignant neoplasm of other parts of uterus: Secondary | ICD-10-CM | POA: Diagnosis not present

## 2020-11-18 DIAGNOSIS — Z888 Allergy status to other drugs, medicaments and biological substances status: Secondary | ICD-10-CM

## 2020-11-18 DIAGNOSIS — J984 Other disorders of lung: Secondary | ICD-10-CM | POA: Diagnosis not present

## 2020-11-18 DIAGNOSIS — J189 Pneumonia, unspecified organism: Principal | ICD-10-CM | POA: Diagnosis present

## 2020-11-18 DIAGNOSIS — Z823 Family history of stroke: Secondary | ICD-10-CM

## 2020-11-18 DIAGNOSIS — Z79899 Other long term (current) drug therapy: Secondary | ICD-10-CM | POA: Diagnosis not present

## 2020-11-18 DIAGNOSIS — E785 Hyperlipidemia, unspecified: Secondary | ICD-10-CM | POA: Diagnosis present

## 2020-11-18 DIAGNOSIS — B009 Herpesviral infection, unspecified: Secondary | ICD-10-CM | POA: Diagnosis present

## 2020-11-18 DIAGNOSIS — I1310 Hypertensive heart and chronic kidney disease without heart failure, with stage 1 through stage 4 chronic kidney disease, or unspecified chronic kidney disease: Secondary | ICD-10-CM | POA: Diagnosis not present

## 2020-11-18 DIAGNOSIS — I452 Bifascicular block: Secondary | ICD-10-CM | POA: Diagnosis not present

## 2020-11-18 DIAGNOSIS — I10 Essential (primary) hypertension: Secondary | ICD-10-CM | POA: Diagnosis not present

## 2020-11-18 DIAGNOSIS — Z794 Long term (current) use of insulin: Secondary | ICD-10-CM | POA: Diagnosis not present

## 2020-11-18 DIAGNOSIS — Z800 Family history of malignant neoplasm of digestive organs: Secondary | ICD-10-CM | POA: Diagnosis not present

## 2020-11-18 DIAGNOSIS — Z743 Need for continuous supervision: Secondary | ICD-10-CM | POA: Diagnosis not present

## 2020-11-18 DIAGNOSIS — Z7401 Bed confinement status: Secondary | ICD-10-CM | POA: Diagnosis not present

## 2020-11-18 DIAGNOSIS — G9340 Encephalopathy, unspecified: Secondary | ICD-10-CM | POA: Diagnosis not present

## 2020-11-18 DIAGNOSIS — E119 Type 2 diabetes mellitus without complications: Secondary | ICD-10-CM | POA: Diagnosis not present

## 2020-11-18 DIAGNOSIS — Z882 Allergy status to sulfonamides status: Secondary | ICD-10-CM

## 2020-11-18 DIAGNOSIS — N1831 Chronic kidney disease, stage 3a: Secondary | ICD-10-CM | POA: Diagnosis not present

## 2020-11-18 DIAGNOSIS — G4733 Obstructive sleep apnea (adult) (pediatric): Secondary | ICD-10-CM | POA: Diagnosis present

## 2020-11-18 DIAGNOSIS — N183 Chronic kidney disease, stage 3 unspecified: Secondary | ICD-10-CM | POA: Diagnosis not present

## 2020-11-18 DIAGNOSIS — Z20822 Contact with and (suspected) exposure to covid-19: Secondary | ICD-10-CM | POA: Diagnosis present

## 2020-11-18 DIAGNOSIS — E876 Hypokalemia: Secondary | ICD-10-CM | POA: Diagnosis not present

## 2020-11-18 DIAGNOSIS — M6281 Muscle weakness (generalized): Secondary | ICD-10-CM | POA: Diagnosis not present

## 2020-11-18 DIAGNOSIS — G4700 Insomnia, unspecified: Secondary | ICD-10-CM | POA: Diagnosis not present

## 2020-11-18 DIAGNOSIS — H409 Unspecified glaucoma: Secondary | ICD-10-CM | POA: Diagnosis present

## 2020-11-18 DIAGNOSIS — E1122 Type 2 diabetes mellitus with diabetic chronic kidney disease: Secondary | ICD-10-CM | POA: Diagnosis present

## 2020-11-18 DIAGNOSIS — R6889 Other general symptoms and signs: Secondary | ICD-10-CM | POA: Diagnosis not present

## 2020-11-18 DIAGNOSIS — M179 Osteoarthritis of knee, unspecified: Secondary | ICD-10-CM | POA: Diagnosis not present

## 2020-11-18 DIAGNOSIS — N83201 Unspecified ovarian cyst, right side: Secondary | ICD-10-CM

## 2020-11-18 DIAGNOSIS — Z880 Allergy status to penicillin: Secondary | ICD-10-CM

## 2020-11-18 LAB — URINALYSIS, ROUTINE W REFLEX MICROSCOPIC
Bilirubin Urine: NEGATIVE
Glucose, UA: >=500 mg/dL — AB
Ketones, ur: NEGATIVE mg/dL
Leukocytes,Ua: NEGATIVE
Nitrite: NEGATIVE
Protein, ur: 100 mg/dL — AB
Specific Gravity, Urine: 1.012 (ref 1.005–1.030)
pH: 7 (ref 5.0–8.0)

## 2020-11-18 LAB — I-STAT CHEM 8, ED
BUN: 21 mg/dL (ref 8–23)
Calcium, Ion: 1.07 mmol/L — ABNORMAL LOW (ref 1.15–1.40)
Chloride: 110 mmol/L (ref 98–111)
Creatinine, Ser: 1.2 mg/dL — ABNORMAL HIGH (ref 0.44–1.00)
Glucose, Bld: 338 mg/dL — ABNORMAL HIGH (ref 70–99)
HCT: 34 % — ABNORMAL LOW (ref 36.0–46.0)
Hemoglobin: 11.6 g/dL — ABNORMAL LOW (ref 12.0–15.0)
Potassium: 3.6 mmol/L (ref 3.5–5.1)
Sodium: 146 mmol/L — ABNORMAL HIGH (ref 135–145)
TCO2: 22 mmol/L (ref 22–32)

## 2020-11-18 LAB — COMPREHENSIVE METABOLIC PANEL
ALT: 12 U/L (ref 0–44)
AST: 20 U/L (ref 15–41)
Albumin: 2.9 g/dL — ABNORMAL LOW (ref 3.5–5.0)
Alkaline Phosphatase: 86 U/L (ref 38–126)
Anion gap: 9 (ref 5–15)
BUN: 20 mg/dL (ref 8–23)
CO2: 22 mmol/L (ref 22–32)
Calcium: 8.3 mg/dL — ABNORMAL LOW (ref 8.9–10.3)
Chloride: 112 mmol/L — ABNORMAL HIGH (ref 98–111)
Creatinine, Ser: 1.27 mg/dL — ABNORMAL HIGH (ref 0.44–1.00)
GFR, Estimated: 44 mL/min — ABNORMAL LOW (ref >60)
Glucose, Bld: 341 mg/dL — ABNORMAL HIGH (ref 70–99)
Potassium: 3.7 mmol/L (ref 3.5–5.1)
Sodium: 143 mmol/L (ref 135–145)
Total Bilirubin: 1.2 mg/dL (ref 0.3–1.2)
Total Protein: 5.3 g/dL — ABNORMAL LOW (ref 6.5–8.1)

## 2020-11-18 LAB — DIFFERENTIAL
Abs Immature Granulocytes: 0.04 10*3/uL (ref 0.00–0.07)
Basophils Absolute: 0.1 10*3/uL (ref 0.0–0.1)
Basophils Relative: 1 %
Eosinophils Absolute: 0.1 10*3/uL (ref 0.0–0.5)
Eosinophils Relative: 2 %
Immature Granulocytes: 1 %
Lymphocytes Relative: 34 %
Lymphs Abs: 2.1 10*3/uL (ref 0.7–4.0)
Monocytes Absolute: 0.6 10*3/uL (ref 0.1–1.0)
Monocytes Relative: 10 %
Neutro Abs: 3.2 10*3/uL (ref 1.7–7.7)
Neutrophils Relative %: 52 %

## 2020-11-18 LAB — CBC
HCT: 36.9 % (ref 36.0–46.0)
Hemoglobin: 12 g/dL (ref 12.0–15.0)
MCH: 30.6 pg (ref 26.0–34.0)
MCHC: 32.5 g/dL (ref 30.0–36.0)
MCV: 94.1 fL (ref 80.0–100.0)
Platelets: 135 10*3/uL — ABNORMAL LOW (ref 150–400)
RBC: 3.92 MIL/uL (ref 3.87–5.11)
RDW: 14.4 % (ref 11.5–15.5)
WBC: 6.1 10*3/uL (ref 4.0–10.5)
nRBC: 0 % (ref 0.0–0.2)

## 2020-11-18 LAB — RAPID URINE DRUG SCREEN, HOSP PERFORMED
Amphetamines: NOT DETECTED
Barbiturates: NOT DETECTED
Benzodiazepines: POSITIVE — AB
Cocaine: NOT DETECTED
Opiates: NOT DETECTED
Tetrahydrocannabinol: NOT DETECTED

## 2020-11-18 LAB — APTT: aPTT: 27 seconds (ref 24–36)

## 2020-11-18 LAB — RESP PANEL BY RT-PCR (FLU A&B, COVID) ARPGX2
Influenza A by PCR: NEGATIVE
Influenza B by PCR: NEGATIVE
SARS Coronavirus 2 by RT PCR: NEGATIVE

## 2020-11-18 LAB — PROTIME-INR
INR: 1.1 (ref 0.8–1.2)
Prothrombin Time: 14.5 seconds (ref 11.4–15.2)

## 2020-11-18 LAB — CBG MONITORING, ED: Glucose-Capillary: 296 mg/dL — ABNORMAL HIGH (ref 70–99)

## 2020-11-18 MED ORDER — DIAZEPAM 5 MG/ML IJ SOLN
5.0000 mg | Freq: Once | INTRAMUSCULAR | Status: AC
  Administered 2020-11-18: 5 mg via INTRAVENOUS
  Filled 2020-11-18: qty 2

## 2020-11-18 MED ORDER — SODIUM CHLORIDE 0.9% FLUSH
3.0000 mL | Freq: Once | INTRAVENOUS | Status: AC
Start: 2020-11-18 — End: 2020-11-18
  Administered 2020-11-18: 3 mL via INTRAVENOUS

## 2020-11-18 MED ORDER — DIAZEPAM 5 MG/ML IJ SOLN
2.5000 mg | Freq: Once | INTRAMUSCULAR | Status: AC
  Administered 2020-11-19: 2.5 mg via INTRAVENOUS
  Filled 2020-11-18: qty 2

## 2020-11-18 MED ORDER — ONDANSETRON HCL 4 MG/2ML IJ SOLN: 4.0000 mg | Freq: Four times a day (QID) | INTRAMUSCULAR | Status: DC | PRN

## 2020-11-18 MED ORDER — LIDOCAINE HCL (PF) 1 % IJ SOLN
5.0000 mL | Freq: Once | INTRAMUSCULAR | Status: AC
  Administered 2020-11-18: 5 mL

## 2020-11-18 MED ORDER — LACTATED RINGERS IV SOLN: INTRAVENOUS | Status: DC

## 2020-11-18 MED ORDER — SODIUM CHLORIDE 0.9 % IV BOLUS
1000.0000 mL | Freq: Once | INTRAVENOUS | Status: AC
  Administered 2020-11-18: 1000 mL via INTRAVENOUS

## 2020-11-18 MED ORDER — DIAZEPAM 5 MG/ML IJ SOLN
2.5000 mg | Freq: Once | INTRAMUSCULAR | Status: AC
  Administered 2020-11-18: 2.5 mg via INTRAVENOUS
  Filled 2020-11-18: qty 2

## 2020-11-18 MED ORDER — ONDANSETRON HCL 4 MG PO TABS: 4.0000 mg | ORAL_TABLET | Freq: Four times a day (QID) | ORAL | Status: DC | PRN

## 2020-11-18 MED ORDER — ENOXAPARIN SODIUM 40 MG/0.4ML IJ SOSY
40.0000 mg | PREFILLED_SYRINGE | INTRAMUSCULAR | Status: DC
  Administered 2020-11-19 – 2020-11-25 (×6): 40 mg via SUBCUTANEOUS
  Filled 2020-11-18 (×7): qty 0.4

## 2020-11-18 MED ORDER — DEXTROSE 5 % IV SOLN
10.0000 mg/kg | Freq: Two times a day (BID) | INTRAVENOUS | Status: DC
  Administered 2020-11-19 – 2020-11-23 (×9): 705 mg via INTRAVENOUS
  Filled 2020-11-18 (×11): qty 14.1

## 2020-11-18 MED ORDER — DEXTROSE 5 % IV SOLN
500.0000 mg | Freq: Once | INTRAVENOUS | Status: AC
  Administered 2020-11-19: 500 mg via INTRAVENOUS
  Filled 2020-11-18: qty 10

## 2020-11-18 MED ORDER — PROPOFOL 10 MG/ML IV BOLUS: 0.5000 mg/kg | Freq: Once | INTRAVENOUS | Status: DC

## 2020-11-18 MED ORDER — ACETAMINOPHEN 650 MG RE SUPP: 650.0000 mg | Freq: Four times a day (QID) | RECTAL | Status: DC | PRN

## 2020-11-18 MED ORDER — LEVETIRACETAM IN NACL 1500 MG/100ML IV SOLN
1500.0000 mg | Freq: Once | INTRAVENOUS | Status: AC
  Administered 2020-11-18: 1500 mg via INTRAVENOUS
  Filled 2020-11-18: qty 100

## 2020-11-18 MED ORDER — SODIUM CHLORIDE 0.9 % IV SOLN
2.0000 g | Freq: Every day | INTRAVENOUS | Status: AC
  Administered 2020-11-19 – 2020-11-22 (×5): 2 g via INTRAVENOUS
  Filled 2020-11-18 (×5): qty 20

## 2020-11-18 MED ORDER — ACETAMINOPHEN 325 MG PO TABS
650.0000 mg | ORAL_TABLET | Freq: Four times a day (QID) | ORAL | Status: DC | PRN
  Administered 2020-11-22 – 2020-11-25 (×2): 650 mg via ORAL
  Filled 2020-11-18 (×2): qty 2

## 2020-11-18 MED ORDER — SODIUM CHLORIDE 0.9 % IV SOLN
500.0000 mg | Freq: Every day | INTRAVENOUS | Status: AC
  Administered 2020-11-19 – 2020-11-22 (×5): 500 mg via INTRAVENOUS
  Filled 2020-11-18 (×5): qty 500

## 2020-11-18 MED ORDER — NIRMATRELVIR/RITONAVIR (PAXLOVID)TABLET
2.0000 | ORAL_TABLET | Freq: Two times a day (BID) | ORAL | Status: DC
Start: 2020-11-19 — End: 2020-11-19
  Filled 2020-11-18: qty 30

## 2020-11-18 MED ORDER — INSULIN ASPART 100 UNIT/ML IJ SOLN
0.0000 [IU] | INTRAMUSCULAR | Status: DC
  Administered 2020-11-19 (×2): 2 [IU] via SUBCUTANEOUS
  Administered 2020-11-19: 3 [IU] via SUBCUTANEOUS
  Administered 2020-11-20 – 2020-11-21 (×3): 1 [IU] via SUBCUTANEOUS
  Administered 2020-11-21: 2 [IU] via SUBCUTANEOUS
  Administered 2020-11-21 (×3): 1 [IU] via SUBCUTANEOUS
  Administered 2020-11-21 – 2020-11-22 (×2): 2 [IU] via SUBCUTANEOUS
  Administered 2020-11-22 – 2020-11-23 (×2): 1 [IU] via SUBCUTANEOUS
  Administered 2020-11-23: 2 [IU] via SUBCUTANEOUS
  Administered 2020-11-23 – 2020-11-24 (×2): 1 [IU] via SUBCUTANEOUS

## 2020-11-18 MED ORDER — INSULIN GLARGINE-YFGN 100 UNIT/ML ~~LOC~~ SOLN
20.0000 [IU] | Freq: Every day | SUBCUTANEOUS | Status: DC
  Administered 2020-11-19 – 2020-11-23 (×5): 20 [IU] via SUBCUTANEOUS
  Filled 2020-11-18 (×7): qty 0.2

## 2020-11-18 MED ORDER — PROPOFOL 10 MG/ML IV BOLUS
INTRAVENOUS | Status: AC
  Filled 2020-11-18: qty 20

## 2020-11-18 MED ORDER — DIAZEPAM 5 MG/ML IJ SOLN
2.5000 mg | Freq: Once | INTRAMUSCULAR | Status: AC
  Administered 2020-11-18: 2.5 mg via INTRAVENOUS

## 2020-11-18 MED ORDER — LIDOCAINE HCL (PF) 1 % IJ SOLN
5.0000 mL | Freq: Once | INTRAMUSCULAR | Status: AC
  Administered 2020-11-18: 5 mL
  Filled 2020-11-18: qty 5

## 2020-11-18 NOTE — Consult Note (Addendum)
Neurology consult   CC: code stroke.  History is obtained from: EMS, husband.   HPI: [name] is a 75 yo female with a PMHx of morbid obesity, endometrial carcinoma s/p radiation, DM II, anxiety, CKD III, GERD, glaucoma, HLD, HTN, and OSA not on CPAP who presented via EMS today after husband called 74. When EMS arrived, patient was unresponsive with right gaze, had fixed pupils and was in respiratory distress. Patient had a seizure and received 10mg  Versed IM. She was placed on NRB and respirations assisted as needed. En route, withdrawal to pain was only response per EMS. BP 160/100 and CBG 332.  After brief exam on the bridge for airway clearance, Patient was taken to ED room for a more formal evaluation of airway. Patient was NT suctioned and her respiratory status was not tenuous afterward.  Patient was taken to CT without intubation and watched carefully.   Upon arrival, patient was not participating in exam, but while in CT, patient became more and more restless with purposeful extremity movements. Due to restlessness, CTH was only imaging obtained which r/o bleed. High on differential list was seizure, so further emergent imaging was not obtained and can be done after patient is more cooperative.    Keppra 1 Gm IV load given in ED.    No further seizure activity witnessed after return from CT to ED  room.   Resent history per husband via telephone. Yesterday, patient went to work and seemed tired, but they went out to eat and patient seemed normal. This am, patient was acting "funny". She was withdrawn, not wanting to talk much, and wouldn't talk to a friend on the phone. Patient was also confused per husband as she was looking for items around the house without purpose. When he would ask her what she was doing, she replied she did not know. Husband also noticed she was having difficulty getting her words out and had delayed responses. Husband states that she began to move her arms in  slow motion swaying side to side over her head, then her arms became rigid. 911 was called.   No recent acute illness, fever, chills, or HA.   Also, spoke briefly to patient's daughter who was not present for today's events, but states the patient hadn't eaten well for a couple of days, and her speech was slowed with word finding difficulties during a phone call.   In review of chart, patient fell in 05/2020 and was seen in ED with findings of a small SDH without need for surgical intervention.   HTD:[id number]  hours tpa given?: No, little suspicion for stroke and improvement in symptoms in CT scanner.  IR Thrombectomy?: No, no suspicion for LVO.  MRS: 1  NIHSS:  1a Level of Consciousness: 2 1b LOC Questions: 2 1c LOC Commands: 2 2 Best Gaze: 1 3 Visual: 0 4 Facial Palsy: 0 5a Motor Arm - left: 3 5b Motor Arm - Right: 3 6a Motor Leg - Left: 3 6b Motor Leg - Right: 3 7 Limb Ataxia: 0 8 Sensory: 0 9 Best Language: 3 10 Dysarthria: 2 11 Extinction and Inattention: 0 TOTAL:  22  ROS: A robust ROS was unable to be performed due to emergent nature of event.   Past Medical History:  Diagnosis Date   Anxiety    Arthritis    "knees are bone on bone and i see dr. Tonita Cong for the cortisone injections "    Cellulitis    hx  of in the legs    Chronic kidney disease (CKD), stage III (moderate) (HCC)    Complication of anesthesia    doesnt know the name of the anesthesia , but i was vomiting while i was still under    Diabetes mellitus without complication (HCC)    type 2    GERD (gastroesophageal reflux disease)    Glaucoma    Gout    History of blood transfusion    as child after accident, after childbirth/hemorroid surgery   History of kidney stones    Hyperlipidemia    Hypertension    Leukocytes in urine    Lumbar back pain    Sleep apnea    non adherant with cpap device     Family History  Problem Relation Age of Onset   Stroke Mother        Cardiac condition   Asthma  Father    Colon cancer Father     Social History:  reports that she has never smoked. She has never used smokeless tobacco. She reports that she does not drink alcohol and does not use drugs.   Prior to Admission medications   Medication Sig Start Date End Date Taking? Authorizing Provider  acetaminophen (TYLENOL) 500 MG tablet Take 2 tablets (1,000 mg total) by mouth every 12 (twelve) hours. 10/01/17   [name], MD  aspirin EC 81 MG tablet Take 81 mg by mouth every morning.    [provider]  Carboxymethylcellul-Glycerin (LUBRICATING EYE DROPS OP) Place 1 drop into both eyes daily as needed (irritation).    [provider]  cetirizine (ZYRTEC) 10 MG tablet Take 10 mg by mouth daily.     [provider]  fenofibrate 160 MG tablet Take 160 mg by mouth every morning.     [provider]  HUMALOG KWIKPEN 200 UNIT/ML KwikPen Inject 10 Units into the skin daily with supper. 11/18/19   [provider]  hydrocortisone cream 1 % Apply 1 application topically daily as needed for itching.    [provider]  insulin degludec (TRESIBA) 200 UNIT/ML FlexTouch Pen Inject 35 Units into the skin daily with breakfast. On a normal day I take 90 units, if it gets in the 70s , I take 5 or 6 units less than the normal    [provider]  loperamide (IMODIUM A-D) 2 MG tablet Take 4 mg by mouth as needed for diarrhea or loose stools.    [provider]  Melatonin 5 MG TABS Take 5-10 mg by mouth at bedtime as needed (sleep).    [provider]  omeprazole (PRILOSEC) 20 MG capsule Take 20 mg by mouth daily. 08/19/18   [provider]  prochlorperazine (COMPAZINE) 10 MG tablet Take 1 tablet (10 mg total) by mouth every 6 (six) hours as needed for nausea or vomiting. Patient not taking: Reported on 07/19/2020 12/18/17   Gery Pray, MD  simvastatin (ZOCOR) 20 MG tablet Take 20 mg by mouth every evening.    [provider]   telmisartan-hydrochlorothiazide (MICARDIS HCT) 80-25 MG tablet Take 1 tablet by mouth daily. 01/06/18   [provider]  traMADol (ULTRAM) 50 MG tablet Take 25-50 mg by mouth 2 (two) times daily as needed for severe pain.    [provider]   Exam: Current vital signs: BP 179/68  HR 99    SaO2 99 on O2.   Physical Exam  Constitutional: WDWN, morbidly obese female.  Psych: Affect appropriate to  situation. Eyes: No scleral injection. HENT: No OP obstruction. Small amount of blood to tongue and in mouth.  Head: Normocephalic. AT.  Cardiovascular: Normal rate and rhythm.  Resp: NRB down to [place]. [place] at first. Effort normal after suctioning.  GI: Abdomen soft.  No distension. There is no tenderness.  Skin: WDI.  Neuro: Mental Status: Patient is unresponsive except to noxious stimuli at first. Requires repeated stimulation to awaken on arrival. Not following commands. Patient is unable to give a clear and coherent history. Right gaze preference, later overcome.  Speech/Language:  Patient is obtunded, no speech. After CT in ED, patient yelled ouch with IV stick.  Cranial Nerves: II: Pupils are equal, round, and reactive to light.  III,IV, VI: right gaze preference later resolved.  V: grimaces with any noxious stimuli.  VII: Facial movement symmetrical with grimacing. No obvious facial droop.  VIII: hearing is intact to voice. X: Will not open mouth.  XI: head is grossly midline.  XII: tongue is midline without atrophy or fasciculations.  Motor: Unable to participate in strength exam but was purposefully moving all extremities after in CT scanner.  Sensory: Withdraws to noxious stimuli in all 4 extremities.  Plantars: Toes are downgoing bilaterally.  Cerebellar: No tremors, clonus, twitching or jerking noted.   I have reviewed labs in epic and the pertinent results are: Na 143, K 3.7, Gluc 341, Creat 1.27, WBCC 6.1, Hgb 12, INR 1.1, aPTT 27.   MD reviewed the  images obtained:  NCT head  There is no acute intracranial hemorrhage or evidence of acute infarction within limitation of significant motion artifact on many slices. ASPECT score is 10.  Assessment: 75 yo female who presents as a code stroke, but strong suspicion for seizure. Her presenting symptoms improved and she moved all extremities in CT. Patient was loaded with Keppra 1 gm IV x 1. Patient will need a MRI when able. Can forego CTA head and neck now due to low suspicion for stroke, but will need to be done if stroke symptoms occur. This is likely a first time seizure and will research possible causes as dementia, metabolic/toxic derangements, infection, or stroke. Prodrome symptoms of not being herself for 2 days, being withdrawn, and confused today.    Impression:  -First time seizure, sounds like GTC.   Plan: - Medicine admit.  -Can hold off on routine AEDs for now. Will start if further seizure activity or if EEG positive.  - MRI brain without contrast. - If stroke on MRI brain, will need complete stroke workup and initiation of DAPT.  - Telemetry monitoring for arrhythmia. - bedside Swallow screen. - frequent neuro checks.  - rEEG  - Recommend metabolic/infectious workup with UA with UCx, CXR, CK, serum lactate.  Patient seen by Clance Boll, MSN, APN-BC, nurse practitioner and by MD. Note/plan to be edited by MD as needed.  Pager: [phone]    I have seen the patient and reviewed the above note.  She has had a new onset seizure with the etiology that is currently unclear.  Possibilities include lowering of seizure threshold due to some physiological stressor in the setting of an underlying predisposition, medication effect (tramadol), is likely CNS infection, but given the prodrome I do think that this should be evaluated.  If she has a pleocytosis or elevated protein, then I would start her on acyclovir.  She continues to be postictal, but is improving at this time,  and is beginning to speak some words.  Neurology will continue to follow.  Roland Rack, MD [facility] [phone]  If 7pm- 7am, please page neurology on call as listed in [place].

## 2020-11-18 NOTE — ED Notes (Signed)
Family at bedside.  Pt following most commands.

## 2020-11-18 NOTE — H&P (Signed)
History and Physical    Elizabeth Crosby VEH:[id number] DOB: Feb 08, 1946 DOA: 11/18/2020  PCP: [name], MD  Patient coming from: Home  I have personally briefly reviewed patient's old medical records in [place]  Chief Complaint: AMS, seizure  HPI: Elizabeth Crosby is a 75 y.o. female with medical history significant of endometrial CA s/p radiation, DM2, HTN, OSA.  Pt with increased confusion for past 2 days more than baseline (has had some baseline AMS since SDH in Feb this year).  Today husband called EMS.  Pt unresponsive with R gaze, fixed pupils, respiratory distress.  Pt had seizure en route to hospital.  Got versed.   ED Course: In ED, pt with significant AMS, confusion.  CXR shows LLL PNA.  WBC nl.  BGL 300s.  UA neg.  COVID pending.  Given loading dose of keppra.  Neuro consulted, see Dr. Leonel Ramsay note.  LP unable to be performed.   Review of Systems: Unable to perform due to AMS.  Past Medical History:  Diagnosis Date   Anxiety    Arthritis    "knees are bone on bone and i see dr. Tonita Cong for the cortisone injections "    Cellulitis    hx  of in the legs    Chronic kidney disease (CKD), stage III (moderate) (HCC)    Complication of anesthesia    doesnt know the name of the anesthesia , but i was vomiting while i was still under    Diabetes mellitus without complication ([place])    type 2    GERD (gastroesophageal reflux disease)    Glaucoma    Gout    History of blood transfusion    as child after accident, after childbirth/hemorroid surgery   History of kidney stones    Hyperlipidemia    Hypertension    Leukocytes in urine    Lumbar back pain    Sleep apnea    non adherant with cpap device     Past Surgical History:  Procedure Laterality Date   APPENDECTOMY  1972   CATARACT EXTRACTION W/PHACO Right 01/10/2014   Procedure: CATARACT EXTRACTION PHACO AND INTRAOCULAR LENS PLACEMENT ([place]);  Surgeon: Tonny Branch, MD;  Location: [facility];  Service: Ophthalmology;  Laterality: Right;  CDE:5.78   CATARACT EXTRACTION W/PHACO Left 01/27/2014   Procedure: CATARACT EXTRACTION PHACO AND INTRAOCULAR LENS PLACEMENT LEFT EYE CDE=5.75;  Surgeon: Tonny Branch, MD;  Location: [facility]S;  Service: Ophthalmology;  Laterality: Left;   CHOLECYSTECTOMY  1972   COLONOSCOPY WITH PROPOFOL N/A 09/01/2012   Procedure: COLONOSCOPY WITH PROPOFOL;  Surgeon: Garlan Fair, MD;  Location: [facility];  Service: Endoscopy;  Laterality: N/A;   KIDNEY STONE SURGERY     OMENTECTOMY  09/30/2017   Procedure: OMENTECTOMY;  Surgeon: [name], MD;  Location: [facility];  Service: Gynecology;;   ROBOTIC ASSISTED TOTAL HYSTERECTOMY WITH BILATERAL SALPINGO OOPHERECTOMY Bilateral 09/30/2017   Procedure: ROBOTIC ASSISTED TOTAL HYSTERECTOMY WITH BILATERAL SALPINGO OOPHORECTOMY;  Surgeon: [name], MD;  Location: [facility];  Service: Gynecology;  Laterality: Bilateral;     reports that she has never smoked. She has never used smokeless tobacco. She reports that she does not drink alcohol and does not use drugs.  Allergies  Allergen Reactions   Ozempic (0.25 Or 0.5 Mg-Dose) [Semaglutide(0.25 Or 0.5mg -Dos)] Nausea And Vomiting   Amoxicillin     Other reaction(s): cellulitis   Canagliflozin     Other reaction(s): UTI   Dulaglutide Other (See Comments)  GI Upset   Lipitor [Atorvastatin] Other (See Comments)    Leg cramps   Metformin Other (See Comments)    Diarrhea    Other Nausea And Vomiting    Pt states does not know drug but anesthesia drugs have made her have nausea and vomiting.    Pregabalin Er     Other reaction(s): dizziness   Sulfa Antibiotics     diarrhea   Potassium-Containing Compounds Rash    Family History  Problem Relation Age of Onset   Stroke Mother        Cardiac condition   Asthma Father    Colon cancer Father      Prior to Admission medications   Medication Sig Start Date End Date Taking? Authorizing Provider  acetaminophen  (TYLENOL) 500 MG tablet Take 2 tablets (1,000 mg total) by mouth every 12 (twelve) hours. Patient taking differently: Take 1,000 mg by mouth every 4 (four) hours as needed for moderate pain or headache. 10/01/17  Yes [name], MD  aspirin EC 81 MG tablet Take 81 mg by mouth daily. Swallow whole.   Yes [provider]  cetirizine (ZYRTEC) 10 MG tablet Take 10 mg by mouth daily.    Yes [provider]  fenofibrate 160 MG tablet Take 160 mg by mouth every morning.    Yes [provider]  HUMALOG KWIKPEN 200 UNIT/ML KwikPen Inject 10 Units into the skin daily with supper. 11/18/19  Yes [provider]  hydrocortisone cream 1 % Apply 1 application topically daily as needed for itching.   Yes [provider]  insulin degludec (TRESIBA) 200 UNIT/ML FlexTouch Pen Inject 40 Units into the skin daily with breakfast.   Yes [provider]  loperamide (IMODIUM A-D) 2 MG tablet Take 4 mg by mouth as needed for diarrhea or loose stools.   Yes [provider]  Melatonin 5 MG TABS Take 5-10 mg by mouth at bedtime as needed (sleep).   Yes [provider]  omeprazole (PRILOSEC) 20 MG capsule Take 20 mg by mouth daily. 08/19/18  Yes [provider]  prochlorperazine (COMPAZINE) 10 MG tablet Take 1 tablet (10 mg total) by mouth every 6 (six) hours as needed for nausea or vomiting. 12/18/17  Yes Gery Pray, MD  simvastatin (ZOCOR) 20 MG tablet Take 20 mg by mouth every evening.   Yes [provider]  telmisartan-hydrochlorothiazide (MICARDIS HCT) 80-25 MG tablet Take 1 tablet by mouth daily. 01/06/18  Yes [provider]  traMADol (ULTRAM) 50 MG tablet Take 25-50 mg by mouth 2 (two) times daily as needed for severe pain.   Yes [provider]  Carboxymethylcellul-Glycerin (LUBRICATING EYE DROPS OP) Place 1 drop into both eyes daily as needed (irritation).    [provider]    Physical Exam: Vitals:    11/18/20 1828 11/18/20 1830 11/18/20 2100 11/18/20 2200  BP:  (!) 121/99 124/80 (!) 130/112  Pulse:  93 96 96  Resp:  15 20 15   Temp: 97.6 F (36.4 C)     TempSrc: Tympanic     SpO2:  96% 94% 96%    Constitutional: Lethargic Eyes: PERRL, lids and conjunctivae normal ENMT: Mucous membranes are moist. Posterior pharynx clear of any exudate or lesions.Normal dentition.  Neck: normal, supple, no masses, no thyromegaly Respiratory: Crackles, L base Cardiovascular: Regular rate and rhythm, no murmurs / rubs / gallops. No extremity edema. 2+ pedal pulses. No carotid bruits.  Abdomen: no tenderness, no masses palpated. No hepatosplenomegaly.  Bowel sounds positive.  Musculoskeletal: no clubbing / cyanosis. No joint deformity upper and lower extremities. Good ROM, no contractures. Normal muscle tone.  Skin: no rashes, lesions, ulcers. No induration Neurologic: MAE Psychiatric: Confused   Labs on Admission: I have personally reviewed following labs and imaging studies  CBC: Recent Labs  Lab 11/18/20 1656 11/18/20 1709  WBC 6.1  --   NEUTROABS 3.2  --   HGB 12.0 11.6*  HCT 36.9 34.0*  MCV 94.1  --   PLT 135*  --    Basic Metabolic Panel: Recent Labs  Lab 11/18/20 1656 11/18/20 1709  NA 143 146*  K 3.7 3.6  CL 112* 110  CO2 22  --   GLUCOSE 341* 338*  BUN 20 21  CREATININE 1.27* 1.20*  CALCIUM 8.3*  --    GFR: CrCl cannot be calculated (Unknown ideal weight.). Liver Function Tests: Recent Labs  Lab 11/18/20 1656  AST 20  ALT 12  ALKPHOS 86  BILITOT 1.2  PROT 5.3*  ALBUMIN 2.9*   No results for input(s): LIPASE, AMYLASE in the last 168 hours. No results for input(s): AMMONIA in the last 168 hours. Coagulation Profile: Recent Labs  Lab 11/18/20 1656  INR 1.1   Cardiac Enzymes: No results for input(s): CKTOTAL, CKMB, CKMBINDEX, TROPONINI in the last 168 hours. BNP (last 3 results) No results for input(s): PROBNP in the last 8760 hours. HbA1C: No results  for input(s): HGBA1C in the last 72 hours. CBG: Recent Labs  Lab 11/18/20 1702  GLUCAP 296*   Lipid Profile: No results for input(s): CHOL, HDL, LDLCALC, TRIG, CHOLHDL, LDLDIRECT in the last 72 hours. Thyroid Function Tests: No results for input(s): TSH, T4TOTAL, FREET4, T3FREE, THYROIDAB in the last 72 hours. Anemia Panel: No results for input(s): VITAMINB12, FOLATE, FERRITIN, TIBC, IRON, RETICCTPCT in the last 72 hours. Urine analysis:    Component Value Date/Time   COLORURINE YELLOW 11/18/2020 2121   APPEARANCEUR HAZY (A) 11/18/2020 2121   LABSPEC 1.012 11/18/2020 2121   PHURINE 7.0 11/18/2020 2121   GLUCOSEU >=500 (A) 11/18/2020 2121   HGBUR SMALL (A) 11/18/2020 2121   BILIRUBINUR NEGATIVE 11/18/2020 2121   [place] NEGATIVE 11/18/2020 2121   PROTEINUR 100 (A) 11/18/2020 2121   NITRITE NEGATIVE 11/18/2020 2121   LEUKOCYTESUR NEGATIVE 11/18/2020 2121    Radiological Exams on Admission: DG Chest 2 View  Result Date: 11/18/2020 CLINICAL DATA:  Altered level of consciousness EXAM: CHEST - 2 VIEW COMPARISON:  11/18/2020 at 6:39 p.m. FINDINGS: Frontal and lateral views of the chest demonstrate a stable cardiac silhouette. Left basilar airspace disease is identified, consistent with asymmetric edema, aspiration, or infection. No effusion or pneumothorax. No acute bony abnormalities. IMPRESSION: 1. Left basilar airspace disease, favor aspiration or infection. Electronically Signed   By: Randa Ngo M.D.   On: 11/18/2020 22:51   DG Chest Portable 1 View  Result Date: 11/18/2020 CLINICAL DATA:  Altered mental status EXAM: PORTABLE CHEST 1 VIEW COMPARISON:  07/31/2020 FINDINGS: The patient is moderately rotated on this examination. Opacification of the left lung base is related to, in part, overlying soft tissue. However, a superimposed asymmetric interstitial pulmonary infiltrate is noted at the left lung base, possibly infectious in the appropriate clinical setting. No pneumothorax  or pleural effusion. Cardiac size within normal limits. No acute bone abnormality. IMPRESSION: Technically limited examination with suspected left basilar focal pulmonary infiltrate, possibly infectious in the acute setting. This could be confirmed with a standard two view chest radiograph, if indicated.  Electronically Signed   By: Fidela Salisbury M.D.   On: 11/18/2020 19:00   CT HEAD CODE STROKE WO CONTRAST  Result Date: 11/18/2020 CLINICAL DATA:  Code stroke.  Neuro deficit, acute, stroke suspected EXAM: CT HEAD WITHOUT CONTRAST TECHNIQUE: Contiguous axial images were obtained from the base of the skull through the vertex without intravenous contrast. COMPARISON:  05/10/2020 FINDINGS: Significant motion artifact is present. Findings below are within this limitation. Brain: No acute intracranial hemorrhage, mass effect, or edema. No new loss of gray-white differentiation. No hydrocephalus or extra-axial collection. Vascular: No hyperdense vessel. Skull: Unremarkable. Sinuses/Orbits: No acute finding. Other: Mastoid air cells are clear. ASPECTS ([place] Stroke Program Early CT Score) - Ganglionic level infarction (caudate, lentiform nuclei, internal capsule, insula, M1-M3 cortex): 7 - Supraganglionic infarction (M4-M6 cortex): 3 Total score (0-10 with 10 being normal): 10 IMPRESSION: There is no acute intracranial hemorrhage or evidence of acute infarction within limitation of significant motion artifact on many slices. ASPECT score is 10. These results were communicated to Dr. Leonel Ramsay at 5:24 pm on 11/18/2020 by text page via the [facility] messaging system. Electronically Signed   By: Macy Mis M.D.   On: 11/18/2020 17:26    EKG: Independently reviewed.  Assessment/Plan Principal Problem:   Acute encephalopathy Active Problems:   Seizure (HCC)   DM2 (diabetes mellitus, type 2) (HCC)   HTN (hypertension)   Left lower lobe pneumonia    Acute encephalopathy, seizures - See neuro consult  note Seizure precautions Tele monitor MRI brain pending IR LP in AM (EDP unable to obtain) Empiric acyclovir for now (per Dr. Lorrin Goodell). Add steroids if urine positive for s.pneumo antigen. But given lack of meningismus, low suspicion for primary CNS bacterial infection per Dr. Lorrin Goodell LLL PNA - PNA pathway IVF: LR at 125 BCx pending Urine for legionella and s.pneumo antigen Empiric rocephin + azithro But will do rocephin at CNS dosing coverage for the moment Check MRSA PCR nares COVID pending DM2 - Lantus 20u daily for the moment (takes tresiba 40 at home) Sensitive SSI Q4H HTN - Holding home BP meds given acute infection and AMS (not able to take POs safely at the moment anyhow).  DVT prophylaxis: Lovenox (start tomorrow AM) Code Status: Full Family Communication: No family in room Disposition Plan: TBD Consults called: Neurology Admission status: Place in 3     Kecia Swoboda, [place] [facility]  How to contact the [facility] Attending or Consulting provider [place] or covering provider during after hours [place], for this patient?  Check the care team in [facility] and look for a) attending/consulting [facility] provider listed and b) the [facility] team listed Log into [email or web address]  Amion Physician Scheduling and messaging for groups and whole hospitals  On call and physician scheduling software for group practices, residents, hospitalists and other medical providers for call, clinic, rotation and shift schedules. On[employer] is a hospital-wide system for scheduling doctors and paging doctors on call. EasyPlot is for scientific plotting and data analysis.  [email or web address]  and use [place]'s universal password to access. If you do not have the password, please contact the hospital operator.  Locate the [facility] provider you are looking for under [facility] and page to a number that you can be directly reached. If you still have difficulty reaching the provider, please page  the [facility] (Director on Call) for the Hospitalists listed on amion for assistance.  11/18/2020, 11:07 PM

## 2020-11-18 NOTE — ED Notes (Signed)
Dr Leonel Ramsay states no stroke.  Likely seizure activity.

## 2020-11-18 NOTE — Progress Notes (Signed)
Pharmacy Antibiotic Note  Elizabeth Crosby is a 75 y.o. 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  Pharmacy has been consulted for acyclovir dosing.  Plan: Acyclovir 500 mg ordered while waiting on accurate weight.  Will conrinue acyclovir 705 mg IV q12 hours.  F/u renal function.  Avoid nephrotoxins. F/u cultures and clinical course  Height: 5\' 1"  (154.9 cm) Weight: 104.3 kg (230 lb) IBW/kg (Calculated) : 47.8  Temp (24hrs), Avg:97.6 F (36.4 C), Min:97.6 F (36.4 C), Max:97.6 F (36.4 C)  Recent Labs  Lab 11/18/20 1656 11/18/20 1709  WBC 6.1  --   CREATININE 1.27* 1.20*    Estimated Creatinine Clearance: 45.7 mL/min (A) (by C-G formula based on SCr of 1.2 mg/dL (H)).    Allergies  Allergen Reactions   Ozempic (0.25 Or 0.5 Mg-Dose) [Semaglutide(0.25 Or 0.5mg -Dos)] Nausea And Vomiting   Amoxicillin     Other reaction(s): cellulitis   Canagliflozin     Other reaction(s): UTI   Dulaglutide Other (See Comments)    GI Upset   Lipitor [Atorvastatin] Other (See Comments)    Leg cramps   Metformin Other (See Comments)    Diarrhea    Other Nausea And Vomiting    Pt states does not know drug but anesthesia drugs have made her have nausea and vomiting.    Pregabalin Er     Other reaction(s): dizziness   Sulfa Antibiotics     diarrhea   Potassium-Containing Compounds Rash     Thank you for allowing pharmacy to be a part of this patient's care.  Excell Seltzer Poteet 11/18/2020 11:46 PM

## 2020-11-18 NOTE — ED Notes (Signed)
Family at bedside.  Pt mentation improving.  Speaking in full sentences, though very confused.

## 2020-11-18 NOTE — ED Provider Notes (Signed)
[name] Provider Note   CSN: [id number] Arrival date & time: 11/18/20  1654     History No chief complaint on file.   Elizabeth Crosby is a 75 y.o. female.  HPI Patient is a 75 year old female with a past medical history of HLD, hypertension, CKD stage III is presenting as a tier 1 code stroke.  Patient's husband states that the patient had an episode of aphasia and was waving her arms in the air and became stiff.  When EMS arrived she had right eye gaze deviation and was unresponsive, patient had fixed pupils and had respiratory distress.  Patient had a seizure when EMS was present.  They gave 10 mg of IM Versed.  Daughter and husband arrived to the ED and was able to give collateral.  They state that the patient has had a change in her mental status since she fell and was found to have a subdural hematoma back in February.  Over the last 4 to 5 days she has been taking pain medications due to right hip pain.  Her daughter notes that she had had slurred speech and confusion for the last several days.  She has no history of seizures.  This is a change from her baseline.  Patient's last known normal was at 3:30 PM today.  Husband denies the patient having any fevers, chill, headache, neck stiffness, chest pain, nausea, vomiting, diarrhea, numbness, weakness, or recent illnesses.     Past Medical History:  Diagnosis Date   Anxiety    Arthritis    "knees are bone on bone and i see dr. Tonita Cong for the cortisone injections "    Cellulitis    hx  of in the legs    Chronic kidney disease (CKD), stage III (moderate) ([place])    Complication of anesthesia    doesnt know the name of the anesthesia , but i was vomiting while i was still under    Diabetes mellitus without complication ([place])    type 2    GERD (gastroesophageal reflux disease)    Glaucoma    Gout    History of blood transfusion    as child after accident, after childbirth/hemorroid  surgery   History of kidney stones    Hyperlipidemia    Hypertension    Leukocytes in urine    Lumbar back pain    Sleep apnea    non adherant with cpap device     Patient Active Problem List   Diagnosis Date Noted   History of heart valve abnormality 10/24/2017   Postmenopausal bleeding 09/30/2017   Pelvic mass 09/30/2017   Morbid obesity ([place]) 09/30/2017   Endometrial cancer ([place]) 09/30/2017   Pelvic mass in female 09/30/2017   Acute rhinitis 07/04/2013   Bronchitis, subacute 05/29/2013   GERD (gastroesophageal reflux disease) 05/29/2013    Past Surgical History:  Procedure Laterality Date   APPENDECTOMY  1972   CATARACT EXTRACTION W/PHACO Right 01/10/2014   Procedure: CATARACT EXTRACTION PHACO AND INTRAOCULAR LENS PLACEMENT ([place]);  Surgeon: Tonny Branch, MD;  Location: [facility];  Service: Ophthalmology;  Laterality: Right;  CDE:5.78   CATARACT EXTRACTION W/PHACO Left 01/27/2014   Procedure: CATARACT EXTRACTION PHACO AND INTRAOCULAR LENS PLACEMENT LEFT EYE CDE=5.75;  Surgeon: Tonny Branch, MD;  Location: [facility];  Service: Ophthalmology;  Laterality: Left;   CHOLECYSTECTOMY  1972   COLONOSCOPY WITH PROPOFOL N/A 09/01/2012   Procedure: COLONOSCOPY WITH PROPOFOL;  Surgeon: Garlan Fair, MD;  Location: [facility];  Service: Endoscopy;  Laterality: N/A;   KIDNEY STONE SURGERY     OMENTECTOMY  09/30/2017   Procedure: OMENTECTOMY;  Surgeon: [name], MD;  Location: [facility];  Service: Gynecology;;   ROBOTIC ASSISTED TOTAL HYSTERECTOMY WITH BILATERAL SALPINGO OOPHERECTOMY Bilateral 09/30/2017   Procedure: ROBOTIC ASSISTED TOTAL HYSTERECTOMY WITH BILATERAL SALPINGO OOPHORECTOMY;  Surgeon: [name], MD;  Location: [facility];  Service: Gynecology;  Laterality: Bilateral;     OB History   No obstetric history on file.     Family History  Problem Relation Age of Onset   Stroke Mother        Cardiac condition   Asthma Father    Colon cancer Father     Social History   Tobacco  Use   Smoking status: Never   Smokeless tobacco: Never  Vaping Use   Vaping Use: Never used  Substance Use Topics   Alcohol use: No   Drug use: No    Home Medications Prior to Admission medications   Medication Sig Start Date End Date Taking? Authorizing Provider  acetaminophen (TYLENOL) 500 MG tablet Take 2 tablets (1,000 mg total) by mouth every 12 (twelve) hours. 10/01/17   [name], MD  aspirin EC 81 MG tablet Take 81 mg by mouth every morning.    [provider]  Carboxymethylcellul-Glycerin (LUBRICATING EYE DROPS OP) Place 1 drop into both eyes daily as needed (irritation).    [provider]  cetirizine (ZYRTEC) 10 MG tablet Take 10 mg by mouth daily.     [provider]  fenofibrate 160 MG tablet Take 160 mg by mouth every morning.     [provider]  HUMALOG KWIKPEN 200 UNIT/ML KwikPen Inject 10 Units into the skin daily with supper. 11/18/19   [provider]  hydrocortisone cream 1 % Apply 1 application topically daily as needed for itching.    [provider]  insulin degludec (TRESIBA) 200 UNIT/ML FlexTouch Pen Inject 35 Units into the skin daily with breakfast. On a normal day I take 90 units, if it gets in the 70s , I take 5 or 6 units less than the normal    [provider]  loperamide (IMODIUM A-D) 2 MG tablet Take 4 mg by mouth as needed for diarrhea or loose stools.    [provider]  Melatonin 5 MG TABS Take 5-10 mg by mouth at bedtime as needed (sleep).    [provider]  omeprazole (PRILOSEC) 20 MG capsule Take 20 mg by mouth daily. 08/19/18   [provider]  prochlorperazine (COMPAZINE) 10 MG tablet Take 1 tablet (10 mg total) by mouth every 6 (six) hours as needed for nausea or vomiting. Patient not taking: Reported on 07/19/2020 12/18/17   Gery Pray, MD  simvastatin (ZOCOR) 20 MG tablet Take 20 mg by mouth every evening.    [provider]   telmisartan-hydrochlorothiazide (MICARDIS HCT) 80-25 MG tablet Take 1 tablet by mouth daily. 01/06/18   [provider]  traMADol (ULTRAM) 50 MG tablet Take 25-50 mg by mouth 2 (two) times daily as needed for severe pain.    [provider]    Allergies    Ozempic (0.25 or 0.5 mg-dose) [semaglutide(0.25 or 0.5mg -dos)], Amoxicillin, Canagliflozin, Dulaglutide, Lipitor [atorvastatin], Metformin, Other, Pregabalin er, Sulfa antibiotics, and Potassium-containing compounds  Review of Systems   Review of Systems  Unable to perform ROS: Mental status change   Physical Exam Updated Vital Signs There were no vitals taken  for this visit.  Physical Exam Vitals and nursing note reviewed.  Constitutional:      General: She is not in acute distress.    Appearance: Normal appearance. She is normal weight. She is not ill-appearing.  HENT:     Head: Normocephalic and atraumatic.     Right Ear: External ear normal.     Left Ear: External ear normal.     Nose: Nose normal. No congestion.     Mouth/Throat:     Mouth: Mucous membranes are moist.     Pharynx: Oropharynx is clear. No oropharyngeal exudate or posterior oropharyngeal erythema.  Eyes:     General: No visual field deficit.    Extraocular Movements: Extraocular movements intact.     Conjunctiva/sclera: Conjunctivae normal.     Pupils: Pupils are equal, round, and reactive to light.  Neck:     Vascular: No carotid bruit.  Cardiovascular:     Rate and Rhythm: Normal rate and regular rhythm.     Pulses: Normal pulses.     Heart sounds: Normal heart sounds. No murmur heard. Pulmonary:     Effort: Pulmonary effort is normal. No respiratory distress.     Breath sounds: Normal breath sounds. No stridor. No wheezing, rhonchi or rales.  Chest:     Chest wall: No tenderness.  Abdominal:     General: Bowel sounds are normal. There is no distension.     Palpations: Abdomen is soft.     Tenderness: There is no abdominal  tenderness. There is no right CVA tenderness, left CVA tenderness, guarding or rebound.  Musculoskeletal:        General: No swelling or tenderness. Normal range of motion.     Cervical back: Normal range of motion and neck supple. No rigidity, tenderness or bony tenderness.     Thoracic back: Normal. No tenderness or bony tenderness.     Lumbar back: Normal. No tenderness or bony tenderness.     Right lower leg: No edema.     Left lower leg: No edema.  Skin:    General: Skin is warm and dry.     Coloration: Skin is not jaundiced.  Neurological:     General: No focal deficit present.     Mental Status: She is alert. She is disoriented.     Cranial Nerves: Cranial nerves are intact. No cranial nerve deficit, dysarthria or facial asymmetry.     Sensory: Sensation is intact. No sensory deficit.     Motor: Motor function is intact. No weakness.    ED Results / Procedures / Treatments   Labs (all labs ordered are listed, but only abnormal results are displayed) Labs Reviewed  CBC - Abnormal; Notable for the following components:      Result Value   Platelets 135 (*)    All other components within normal limits  I-STAT CHEM 8, ED - Abnormal; Notable for the following components:   Sodium 146 (*)    Creatinine, Ser 1.20 (*)    Glucose, Bld 338 (*)    Calcium, Ion 1.07 (*)    Hemoglobin 11.6 (*)    HCT 34.0 (*)    All other components within normal limits  CBG MONITORING, ED - Abnormal; Notable for the following components:   Glucose-Capillary 296 (*)    All other components within normal limits  RESP PANEL BY RT-PCR (FLU A&B, COVID) [id number]  PROTIME-INR  APTT  DIFFERENTIAL  COMPREHENSIVE METABOLIC PANEL    EKG None  Radiology CT  HEAD CODE STROKE WO CONTRAST  Result Date: 11/18/2020 CLINICAL DATA:  Code stroke.  Neuro deficit, acute, stroke suspected EXAM: CT HEAD WITHOUT CONTRAST TECHNIQUE: Contiguous axial images were obtained from the base of the skull through the vertex  without intravenous contrast. COMPARISON:  05/10/2020 FINDINGS: Significant motion artifact is present. Findings below are within this limitation. Brain: No acute intracranial hemorrhage, mass effect, or edema. No new loss of gray-white differentiation. No hydrocephalus or extra-axial collection. Vascular: No hyperdense vessel. Skull: Unremarkable. Sinuses/Orbits: No acute finding. Other: Mastoid air cells are clear. ASPECTS ([place] Stroke Program Early CT Score) - Ganglionic level infarction (caudate, lentiform nuclei, internal capsule, insula, M1-M3 cortex): 7 - Supraganglionic infarction (M4-M6 cortex): 3 Total score (0-10 with 10 being normal): 10 IMPRESSION: There is no acute intracranial hemorrhage or evidence of acute infarction within limitation of significant motion artifact on many slices. ASPECT score is 10. These results were communicated to Dr. Leonel Ramsay at 5:24 pm on 11/18/2020 by text page via the [facility] messaging system. Electronically Signed   By: Macy Mis M.D.   On: 11/18/2020 17:26    Procedures .Lumbar Puncture  Date/Time: 11/18/2020 9:35 PM Performed by: Doretha Sou, MD Authorized by: Sherwood Gambler, MD   Consent:    Consent obtained:  Verbal   Consent given by:  Spouse (Daughter)   Risks, benefits, and alternatives were discussed: yes     Risks discussed:  Bleeding, infection, pain, repeat procedure, nerve damage and headache Universal protocol:    Procedure explained and questions answered to patient or proxy's satisfaction: yes     Patient identity confirmed:  Verbally with patient, hospital-assigned identification number and arm band Pre-procedure details:    Procedure purpose:  Diagnostic   Preparation: Patient was prepped and draped in usual sterile fashion   Anesthesia:    Anesthesia method:  Local infiltration   Local anesthetic:  Lidocaine 1% w/o epi Procedure details:    Lumbar space:  L4-L5 interspace   Patient position:  R lateral decubitus   Number  of attempts:  2 Post-procedure details:    Puncture site:  Adhesive bandage applied Comments:     Procedure was unsuccesful after two attempts by two different providers   Medications Ordered in ED Medications  sodium chloride flush (NS) 0.9 % injection 3 mL (has no administration in time range)    ED Course  I have reviewed the triage vital signs and the nursing notes.  Pertinent labs & imaging results that were available during my care of the patient were reviewed by me and considered in my medical decision making (see chart for details).    MDM Rules/Calculators/A&P                         Elizabeth Crosby is a 75 y.o. female who presented to the ED as a tier 1 code stroke.  Patient's last known normal was at 3:30 PM today.  Patient was unresponsive and given Versed due to concern for seizure activity by EMS.  On arrival to our ED patient began to have improvement in mental status.  She was making purposeful movements.  She had right gaze deviation.  Patient was protecting her airway. Patient was NT suctioned and her respiratory status improved. Initially on arrival to our ED she was not participating in the exam, however, she began to make purposeful movements. She was postictal. She received versed with EMS and there had been improvement in mental status since then.  Neurology was at the bedside when the patient arrived. CT head showed no bleed Neurology believes that her symptoms are most likely secondary to a seizure. She was given IV keppra. She was given valium to help with agitation.   Patient is unable to stay still and is at risk of pulling out lines and hurt her self.  Patient placed in soft restraints.  Neurology is recommending medicine admission due to first time seizure. Neurology recommended an LP. The LP was performed by two different providers and were unsuccessful. We discussed this with neurology who recommend IR guidance LP while the patient is admitted.   Initial CXR  suspects possible left basilar focal pulmonary infiltrate. Repeat CXR ordered. CBC unremkarlabe. CMP showed an increase in creatinine. Covid test was negative. UA showed no infection.   Patient admitted to hospitalist service with neurology following.   The plan for this patient was discussed with Dr. Regenia Skeeter, who voiced agreement and who oversaw evaluation and treatment of this patient.   Final Clinical Impression(s) / ED Diagnoses Final diagnoses:  None    Rx / DC Orders ED Discharge Orders     None        Doretha Sou, MD 11/19/20 [place], MD 11/23/20 [phone]

## 2020-11-19 ENCOUNTER — Encounter (HOSPITAL_COMMUNITY): Payer: Self-pay | Admitting: Internal Medicine

## 2020-11-19 ENCOUNTER — Other Ambulatory Visit: Payer: Self-pay

## 2020-11-19 DIAGNOSIS — G9340 Encephalopathy, unspecified: Secondary | ICD-10-CM | POA: Diagnosis not present

## 2020-11-19 DIAGNOSIS — M109 Gout, unspecified: Secondary | ICD-10-CM | POA: Diagnosis present

## 2020-11-19 DIAGNOSIS — E1122 Type 2 diabetes mellitus with diabetic chronic kidney disease: Secondary | ICD-10-CM | POA: Diagnosis present

## 2020-11-19 DIAGNOSIS — J189 Pneumonia, unspecified organism: Secondary | ICD-10-CM | POA: Diagnosis present

## 2020-11-19 DIAGNOSIS — I6522 Occlusion and stenosis of left carotid artery: Secondary | ICD-10-CM | POA: Diagnosis not present

## 2020-11-19 DIAGNOSIS — Z20822 Contact with and (suspected) exposure to covid-19: Secondary | ICD-10-CM | POA: Diagnosis present

## 2020-11-19 DIAGNOSIS — Z8542 Personal history of malignant neoplasm of other parts of uterus: Secondary | ICD-10-CM | POA: Diagnosis not present

## 2020-11-19 DIAGNOSIS — Z794 Long term (current) use of insulin: Secondary | ICD-10-CM | POA: Diagnosis not present

## 2020-11-19 DIAGNOSIS — B009 Herpesviral infection, unspecified: Secondary | ICD-10-CM | POA: Diagnosis present

## 2020-11-19 DIAGNOSIS — I10 Essential (primary) hypertension: Secondary | ICD-10-CM | POA: Diagnosis not present

## 2020-11-19 DIAGNOSIS — E785 Hyperlipidemia, unspecified: Secondary | ICD-10-CM | POA: Diagnosis present

## 2020-11-19 DIAGNOSIS — G9341 Metabolic encephalopathy: Secondary | ICD-10-CM | POA: Diagnosis present

## 2020-11-19 DIAGNOSIS — R569 Unspecified convulsions: Secondary | ICD-10-CM | POA: Diagnosis present

## 2020-11-19 DIAGNOSIS — Z800 Family history of malignant neoplasm of digestive organs: Secondary | ICD-10-CM | POA: Diagnosis not present

## 2020-11-19 DIAGNOSIS — R4182 Altered mental status, unspecified: Secondary | ICD-10-CM | POA: Diagnosis present

## 2020-11-19 DIAGNOSIS — I129 Hypertensive chronic kidney disease with stage 1 through stage 4 chronic kidney disease, or unspecified chronic kidney disease: Secondary | ICD-10-CM | POA: Diagnosis present

## 2020-11-19 DIAGNOSIS — N1831 Chronic kidney disease, stage 3a: Secondary | ICD-10-CM | POA: Diagnosis present

## 2020-11-19 DIAGNOSIS — E876 Hypokalemia: Secondary | ICD-10-CM | POA: Diagnosis not present

## 2020-11-19 DIAGNOSIS — I452 Bifascicular block: Secondary | ICD-10-CM | POA: Diagnosis present

## 2020-11-19 DIAGNOSIS — Z79899 Other long term (current) drug therapy: Secondary | ICD-10-CM | POA: Diagnosis not present

## 2020-11-19 DIAGNOSIS — K219 Gastro-esophageal reflux disease without esophagitis: Secondary | ICD-10-CM | POA: Diagnosis present

## 2020-11-19 DIAGNOSIS — Z7982 Long term (current) use of aspirin: Secondary | ICD-10-CM | POA: Diagnosis not present

## 2020-11-19 DIAGNOSIS — E119 Type 2 diabetes mellitus without complications: Secondary | ICD-10-CM | POA: Diagnosis not present

## 2020-11-19 DIAGNOSIS — Z823 Family history of stroke: Secondary | ICD-10-CM | POA: Diagnosis not present

## 2020-11-19 DIAGNOSIS — Z825 Family history of asthma and other chronic lower respiratory diseases: Secondary | ICD-10-CM | POA: Diagnosis not present

## 2020-11-19 DIAGNOSIS — E11649 Type 2 diabetes mellitus with hypoglycemia without coma: Secondary | ICD-10-CM | POA: Diagnosis present

## 2020-11-19 DIAGNOSIS — Z923 Personal history of irradiation: Secondary | ICD-10-CM | POA: Diagnosis not present

## 2020-11-19 LAB — HEMOGLOBIN A1C
Hgb A1c MFr Bld: 7.2 % — ABNORMAL HIGH (ref 4.8–5.6)
Mean Plasma Glucose: 159.94 mg/dL

## 2020-11-19 LAB — COMPREHENSIVE METABOLIC PANEL
ALT: 11 U/L (ref 0–44)
AST: 21 U/L (ref 15–41)
Albumin: 2.4 g/dL — ABNORMAL LOW (ref 3.5–5.0)
Alkaline Phosphatase: 67 U/L (ref 38–126)
Anion gap: 8 (ref 5–15)
BUN: 20 mg/dL (ref 8–23)
CO2: 23 mmol/L (ref 22–32)
Calcium: 8 mg/dL — ABNORMAL LOW (ref 8.9–10.3)
Chloride: 112 mmol/L — ABNORMAL HIGH (ref 98–111)
Creatinine, Ser: 1.19 mg/dL — ABNORMAL HIGH (ref 0.44–1.00)
GFR, Estimated: 48 mL/min — ABNORMAL LOW (ref >60)
Glucose, Bld: 232 mg/dL — ABNORMAL HIGH (ref 70–99)
Potassium: 3.5 mmol/L (ref 3.5–5.1)
Sodium: 143 mmol/L (ref 135–145)
Total Bilirubin: 1.8 mg/dL — ABNORMAL HIGH (ref 0.3–1.2)
Total Protein: 4.6 g/dL — ABNORMAL LOW (ref 6.5–8.1)

## 2020-11-19 LAB — CBG MONITORING, ED
Glucose-Capillary: 225 mg/dL — ABNORMAL HIGH (ref 70–99)
Glucose-Capillary: 180 mg/dL — ABNORMAL HIGH (ref 70–99)
Glucose-Capillary: 153 mg/dL — ABNORMAL HIGH (ref 70–99)

## 2020-11-19 LAB — CBC
HCT: 35.9 % — ABNORMAL LOW (ref 36.0–46.0)
Hemoglobin: 11.4 g/dL — ABNORMAL LOW (ref 12.0–15.0)
MCH: 30.6 pg (ref 26.0–34.0)
MCHC: 31.8 g/dL (ref 30.0–36.0)
MCV: 96.5 fL (ref 80.0–100.0)
Platelets: 116 10*3/uL — ABNORMAL LOW (ref 150–400)
RBC: 3.72 MIL/uL — ABNORMAL LOW (ref 3.87–5.11)
RDW: 14.5 % (ref 11.5–15.5)
WBC: 13.5 10*3/uL — ABNORMAL HIGH (ref 4.0–10.5)
nRBC: 0 % (ref 0.0–0.2)

## 2020-11-19 LAB — PROCALCITONIN: Procalcitonin: 0.24 ng/mL

## 2020-11-19 LAB — GLUCOSE, CAPILLARY
Glucose-Capillary: 79 mg/dL (ref 70–99)
Glucose-Capillary: 66 mg/dL — ABNORMAL LOW (ref 70–99)

## 2020-11-19 LAB — MRSA NEXT GEN BY PCR, NASAL: MRSA by PCR Next Gen: NOT DETECTED

## 2020-11-19 LAB — HIV ANTIBODY (ROUTINE TESTING W REFLEX): HIV Screen 4th Generation wRfx: NONREACTIVE

## 2020-11-19 LAB — STREP PNEUMONIAE URINARY ANTIGEN: Strep Pneumo Urinary Antigen: NEGATIVE

## 2020-11-19 MED ORDER — NIRMATRELVIR/RITONAVIR (PAXLOVID) TABLET (RENAL DOSING)
2.0000 | ORAL_TABLET | Freq: Two times a day (BID) | ORAL | Status: DC
  Filled 2020-11-19: qty 20

## 2020-11-19 MED ORDER — DEXTROSE 50 % IV SOLN
INTRAVENOUS | Status: AC
  Administered 2020-11-19: 12.5 mL
  Filled 2020-11-19: qty 50

## 2020-11-19 MED ORDER — GADOBUTROL 1 MMOL/ML IV SOLN
10.0000 mL | Freq: Once | INTRAVENOUS | Status: AC | PRN
  Administered 2020-11-19: 10 mL via INTRAVENOUS

## 2020-11-19 MED ORDER — DEXTROSE-NACL 5-0.9 % IV SOLN: INTRAVENOUS | Status: DC

## 2020-11-19 MED ORDER — LEVETIRACETAM IN NACL 1000 MG/100ML IV SOLN
1000.0000 mg | Freq: Two times a day (BID) | INTRAVENOUS | Status: DC
  Administered 2020-11-19 – 2020-11-20 (×3): 1000 mg via INTRAVENOUS
  Filled 2020-11-19 (×4): qty 100

## 2020-11-19 MED ORDER — THIAMINE HCL 100 MG PO TABS: 100.0000 mg | ORAL_TABLET | Freq: Every day | ORAL | Status: DC

## 2020-11-19 NOTE — Progress Notes (Signed)
Subjective: Improved, but still very confused.   LP attempted at bedside, currently pending. Denies any current headache.   Exam: Vitals:   11/19/20 0230 11/19/20 0625  BP: 138/69 (!) 94/55  Pulse: 81 84  Resp: 20 18  Temp:    SpO2: 94% 97%   Gen: In bed, NAD Resp: non-labored breathing, no acute distress Abd: soft, nt  Neuro: MS: Awake, not oriented. She is confabulating, and alos perseverating on numbers. EX:HBZJI, EOMI, ? Mild left facial weakness Motor: 5/5 thoruhgout Sensory:intact to LT  Pertinent Labs: Cr 1.19  Impression: 75 yo F with new onset progressive confusion and seizures.  Etiology is unclear, hence need for LP.  Stroke is certainly one possibility, but her exam is slightly bizarre and I think that LP is still needed even though she has not been febrile. I agree with acyclovir pending LP. I think bacterial meningitis unlikely.   Recommendations: 1) Acyclovir given delay in LP 2) LP for cells, protein, glucose, hsv 3) EEG 4) MRI brain 5) Continue keppra 1000mg  BID  Roland Rack, MD [facility] [phone]  If 7pm- 7am, please page neurology on call as listed in [place].

## 2020-11-19 NOTE — Progress Notes (Signed)
[facility] PCU coverage note.  The nursing staff communicated to [place] the patient was hypoglycemic with CBG of 66 mg/dL.  They administered 12.5 g of dextrose IVP.  The staff also made [place] aware that the patient is running LR at 125 mL/h.  NS dextrose 5% at 125 mL/h was ordered.  She is allergic to potassium containing IV fluids.  We will continue to monitor the CBG.  Tennis Must, MD.

## 2020-11-19 NOTE — ED Notes (Signed)
Dr. Nettey at bedside 

## 2020-11-19 NOTE — ED Notes (Signed)
Pt repositioned, placed back in gown, on BP cuff and pulse ox

## 2020-11-19 NOTE — ED Notes (Signed)
Two unsuccessful IV attempts. Unable to collect BC's as well. NT Clifton James attempted at [facility] with no success

## 2020-11-19 NOTE — ED Notes (Signed)
Report received from [name], RN.

## 2020-11-19 NOTE — ED Notes (Signed)
Unable to keep pt on the pulse ox, bp cuff and cardiac monitor,multiple attempts made and RN aware.

## 2020-11-19 NOTE — Progress Notes (Addendum)
PROGRESS NOTE    SIRI BUEGE  XKG:[id number] DOB: 07/01/1945 DOA: 11/18/2020 PCP: [name], MD   Brief Narrative: Elizabeth Crosby is a 75 y.o.  female with medical history significant of endometrial CA s/p radiation, DM2, HTN, OSA. Patient presented secondary to altered mental status and seizure witness during EMS transportation. SHe was found to have a LLL pneumonia and was started on antibiotics. Neurology consulted with recommendation for LP studies and EEG.   Assessment & Plan:   Principal Problem:   Acute encephalopathy Active Problems:   Seizure ([place])   DM2 (diabetes mellitus, type 2) (HCC)   HTN (hypertension)   Left lower lobe pneumonia   Acute encephalopathy Seizures Unclear etiology. Neurology consulted with recommendation for LP; initial attempt failed. MRI brain without acute process -Neurology recommendations: Start acyclovir; LP for cells, protein, glucose, HSV; EEG. Started Keppra  Left lower lobe pneumonia Started on Ceftriaxone IV and azithromycin IV -Continue Ceftriaxone/azithromycin -Follow up blood cultures  Diabetes mellitus, type 2 Patient is on Tresiba 40 units daily and Humalog 10 units daily with supper as an outpatient. Started on SSI while inpatient -Continue SSI for now  Primary hypertension Patient is on telmesartan, hydrochlorothiazide as an outpatient.   DVT prophylaxis: Lovenox Code Status:   Code Status: Full Code Family Communication: None at bedside Disposition Plan: Discharge home vs SNF likely in several days pending neuro workup/management   Consultants:  Neurology  Procedures:  None  Antimicrobials: Ceftriaxone Azithromycin Acyclovir    Subjective: No issues overnight. LP attempted but failed. Patient continues to repeat her date of birth to answer most questions  Objective: Vitals:   11/19/20 1242 11/19/20 1503 11/19/20 1530 11/19/20 1612  BP:  (!) 109/97  (!) 140/47  Pulse: 96 95  92  Resp:    17   Temp:   (!) 100.9 F (38.3 C) (!) 101 F (38.3 C)  TempSrc:   Oral Axillary  SpO2: 95% 98%  97%  Weight:      Height:        Intake/Output Summary (Last 24 hours) at 11/19/2020 1617 Last data filed at 11/19/2020 1111 Gross per 24 hour  Intake 97.34 ml  Output 400 ml  Net -302.66 ml   Filed Weights   11/18/20 2330  Weight: 104.3 kg    Examination:  General exam: Appears calm and comfortable Respiratory system: Clear to auscultation. Respiratory effort normal. Cardiovascular system: S1 & S2 heard, RRR. No murmurs, rubs, gallops or clicks. Gastrointestinal system: Abdomen is nondistended, soft and nontender. No organomegaly or masses felt. Normal bowel sounds heard. Central nervous system: Alert and oriented to self. Able to tell me the name of her husband. Musculoskeletal: No edema. No calf tenderness Skin: No cyanosis. No rashes Psychiatry: Judgement and insight appear impaired. Mood & affect appropriate.     Data Reviewed: I have personally reviewed following labs and imaging studies  CBC Lab Results  Component Value Date   WBC 13.5 (H) 11/19/2020   RBC 3.72 (L) 11/19/2020   HGB 11.4 (L) 11/19/2020   HCT 35.9 (L) 11/19/2020   MCV 96.5 11/19/2020   MCH 30.6 11/19/2020   PLT 116 (L) 11/19/2020   MCHC 31.8 11/19/2020   RDW 14.5 11/19/2020   LYMPHSABS 2.1 11/18/2020   MONOABS 0.6 11/18/2020   EOSABS 0.1 11/18/2020   BASOSABS 0.1 70/26/3785     Last metabolic panel Lab Results  Component Value Date   NA 143 11/19/2020   K 3.5 11/19/2020  CL 112 (H) 11/19/2020   CO2 23 11/19/2020   BUN 20 11/19/2020   CREATININE 1.19 (H) 11/19/2020   GLUCOSE 232 (H) 11/19/2020   GFRNONAA 48 (L) 11/19/2020   GFRAA 48 (L) 10/01/2017   CALCIUM 8.0 (L) 11/19/2020   PROT 4.6 (L) 11/19/2020   ALBUMIN 2.4 (L) 11/19/2020   BILITOT 1.8 (H) 11/19/2020   ALKPHOS 67 11/19/2020   AST 21 11/19/2020   ALT 11 11/19/2020   ANIONGAP 8 11/19/2020    CBG (last 3)  Recent Labs     11/19/20 0327 11/19/20 0923 11/19/20 1205  GLUCAP 225* 180* 153*     GFR: Estimated Creatinine Clearance: 46.1 mL/min (A) (by C-G formula based on SCr of 1.19 mg/dL (H)).  Coagulation Profile: Recent Labs  Lab 11/18/20 1656  INR 1.1    Recent Results (from the past 240 hour(s))  Resp Panel by RT-PCR (Flu A&B, Covid) Nasopharyngeal Swab     Status: None   Collection Time: 11/18/20  4:59 PM   Specimen: Nasopharyngeal Swab; Nasopharyngeal(NP) swabs in vial transport medium  Result Value Ref Range Status   SARS Coronavirus 2 by RT PCR NEGATIVE NEGATIVE Final   Influenza A by PCR NEGATIVE NEGATIVE Final   Influenza B by PCR NEGATIVE NEGATIVE Final    Comment: (NOTE) The Xpert Xpress SARS-CoV-2/FLU/RSV plus assay is intended as an aid in the diagnosis of influenza from Nasopharyngeal swab specimens and should not be used as a sole basis for treatment. Nasal washings and aspirates are unacceptable for Xpert Xpress SARS-CoV-2/FLU/RSV testing.  Fact Sheet for Patients: EntrepreneurPulse.com.au  Fact Sheet for Healthcare Providers: IncredibleEmployment.be  This test is not yet approved or cleared by the [place] FDA and has been authorized for detection and/or diagnosis of SARS-CoV-2 by FDA under an Emergency Use Authorization (EUA). This EUA will remain in effect (meaning this test can be used) for the duration of the COVID-19 declaration under Section 564(b)(1) of the Act, 21 U.S.C. section 360bbb-3(b)(1), unless the authorization is terminated or revoked.  Performed at [facility], [facility] [address], [place], [place] [address]         Radiology Studies: DG Chest 2 View  Result Date: 11/18/2020 CLINICAL DATA:  Altered level of consciousness EXAM: CHEST - 2 VIEW COMPARISON:  11/18/2020 at 6:39 p.m. FINDINGS: Frontal and lateral views of the chest demonstrate a stable cardiac silhouette. Left basilar airspace disease is  identified, consistent with asymmetric edema, aspiration, or infection. No effusion or pneumothorax. No acute bony abnormalities. IMPRESSION: 1. Left basilar airspace disease, favor aspiration or infection. Electronically Signed   By: Randa Ngo M.D.   On: 11/18/2020 22:51   MR BRAIN W WO CONTRAST  Result Date: 11/19/2020 CLINICAL DATA:  Encephalopathy EXAM: MRI HEAD WITHOUT AND WITH CONTRAST TECHNIQUE: Multiplanar, multiecho pulse sequences of the brain and surrounding structures were obtained without and with intravenous contrast. CONTRAST:  100mL GADAVIST GADOBUTROL 1 MMOL/ML IV SOLN COMPARISON:  None. FINDINGS: Brain: No acute infarct, mass effect or extra-axial collection. No acute or chronic hemorrhage. There is multifocal hyperintense T2-weighted signal within the white matter. Parenchymal volume and CSF spaces are normal. The midline structures are normal. There is no abnormal contrast enhancement. Vascular: Major flow voids are preserved. Skull and upper cervical spine: Normal calvarium and skull base. Visualized upper cervical spine and soft tissues are normal. Sinuses/Orbits:No paranasal sinus fluid levels or advanced mucosal thickening. No mastoid or middle ear effusion. Normal orbits. IMPRESSION: 1. No acute intracranial abnormality. 2.  White matter changes that may be due to chronic small vessel disease or a sequela of cancer treatment. Electronically Signed   By: Ulyses Jarred M.D.   On: 11/19/2020 00:53   DG Chest Portable 1 View  Result Date: 11/18/2020 CLINICAL DATA:  Altered mental status EXAM: PORTABLE CHEST 1 VIEW COMPARISON:  07/31/2020 FINDINGS: The patient is moderately rotated on this examination. Opacification of the left lung base is related to, in part, overlying soft tissue. However, a superimposed asymmetric interstitial pulmonary infiltrate is noted at the left lung base, possibly infectious in the appropriate clinical setting. No pneumothorax or pleural effusion. Cardiac  size within normal limits. No acute bone abnormality. IMPRESSION: Technically limited examination with suspected left basilar focal pulmonary infiltrate, possibly infectious in the acute setting. This could be confirmed with a standard two view chest radiograph, if indicated. Electronically Signed   By: Fidela Salisbury M.D.   On: 11/18/2020 19:00   CT HEAD CODE STROKE WO CONTRAST  Result Date: 11/18/2020 CLINICAL DATA:  Code stroke.  Neuro deficit, acute, stroke suspected EXAM: CT HEAD WITHOUT CONTRAST TECHNIQUE: Contiguous axial images were obtained from the base of the skull through the vertex without intravenous contrast. COMPARISON:  05/10/2020 FINDINGS: Significant motion artifact is present. Findings below are within this limitation. Brain: No acute intracranial hemorrhage, mass effect, or edema. No new loss of gray-white differentiation. No hydrocephalus or extra-axial collection. Vascular: No hyperdense vessel. Skull: Unremarkable. Sinuses/Orbits: No acute finding. Other: Mastoid air cells are clear. ASPECTS ([place] Stroke Program Early CT Score) - Ganglionic level infarction (caudate, lentiform nuclei, internal capsule, insula, M1-M3 cortex): 7 - Supraganglionic infarction (M4-M6 cortex): 3 Total score (0-10 with 10 being normal): 10 IMPRESSION: There is no acute intracranial hemorrhage or evidence of acute infarction within limitation of significant motion artifact on many slices. ASPECT score is 10. These results were communicated to Dr. Leonel Ramsay at 5:24 pm on 11/18/2020 by text page via the [facility] messaging system. Electronically Signed   By: Macy Mis M.D.   On: 11/18/2020 17:26        Scheduled Meds:  enoxaparin (LOVENOX) injection  40 mg Subcutaneous Q24H   insulin aspart  0-9 Units Subcutaneous Q4H   insulin glargine-yfgn  20 Units Subcutaneous Daily   Continuous Infusions:  acyclovir Stopped (11/19/20 1126)   azithromycin Stopped (11/19/20 0324)   cefTRIAXone (ROCEPHIN)  IV  Stopped (11/19/20 0224)   lactated ringers 125 mL/hr at 11/19/20 1140   levETIRAcetam Stopped (11/19/20 1151)     LOS: 0 days     Cordelia Poche, MD [facility] 11/19/2020, 4:17 PM  If 7PM-7AM, please contact night-coverage [email or web address]

## 2020-11-19 NOTE — ED Notes (Addendum)
Patient holding head in hands, when asked if her head hurt she replied yes. Also noticed she felt warn to the touch, oral temp 100.5. MD aware. Patient remains altered.

## 2020-11-19 NOTE — Progress Notes (Signed)
Pt CBG 66 12.5 of dextrose will be given.

## 2020-11-19 NOTE — ED Notes (Signed)
Spoke with Elizabeth Crosby in [place], states Lovenox needs to be held before she can get her LP. Lovenox for tomorrow should be held.

## 2020-11-19 NOTE — Plan of Care (Signed)
  Problem: Safety: Goal: Non-violent Restraint(s) Outcome: Progressing   Problem: Education: Goal: Knowledge of General Education information will improve Description: Including pain rating scale, medication(s)/side effects and non-pharmacologic comfort measures Outcome: Progressing   Problem: Health Behavior/Discharge Planning: Goal: Ability to manage health-related needs will improve Outcome: Progressing   Problem: Clinical Measurements: Goal: Ability to maintain clinical measurements within normal limits will improve Outcome: Progressing Goal: Will remain free from infection Outcome: Progressing Goal: Diagnostic test results will improve Outcome: Progressing Goal: Respiratory complications will improve Outcome: Progressing Goal: Cardiovascular complication will be avoided Outcome: Progressing   Problem: Activity: Goal: Risk for activity intolerance will decrease Outcome: Progressing   Problem: Nutrition: Goal: Adequate nutrition will be maintained Outcome: Progressing   Problem: Coping: Goal: Level of anxiety will decrease Outcome: Progressing   Problem: Elimination: Goal: Will not experience complications related to bowel motility Outcome: Progressing Goal: Will not experience complications related to urinary retention Outcome: Progressing   Problem: Pain Managment: Goal: General experience of comfort will improve Outcome: Progressing   Problem: Safety: Goal: Ability to remain free from injury will improve Outcome: Progressing   Problem: Skin Integrity: Goal: Risk for impaired skin integrity will decrease Outcome: Progressing   Problem: Education: Goal: Expressions of having a comfortable level of knowledge regarding the disease process will increase Outcome: Progressing   Problem: Coping: Goal: Ability to adjust to condition or change in health will improve Outcome: Progressing Goal: Ability to identify appropriate support needs will  improve Outcome: Progressing   Problem: Health Behavior/Discharge Planning: Goal: Compliance with prescribed medication regimen will improve Outcome: Progressing   Problem: Medication: Goal: Risk for medication side effects will decrease Outcome: Progressing   Problem: Clinical Measurements: Goal: Complications related to the disease process, condition or treatment will be avoided or minimized Outcome: Progressing Goal: Diagnostic test results will improve Outcome: Progressing   Problem: Self-Concept: Goal: Level of anxiety will decrease Outcome: Progressing Goal: Ability to verbalize feelings about condition will improve Outcome: Progressing

## 2020-11-19 NOTE — ED Notes (Signed)
Dr. Leonel Ramsay, neurology at bedside

## 2020-11-20 ENCOUNTER — Inpatient Hospital Stay (HOSPITAL_COMMUNITY): Payer: Medicare Other

## 2020-11-20 ENCOUNTER — Encounter (HOSPITAL_COMMUNITY): Payer: Self-pay | Admitting: Internal Medicine

## 2020-11-20 DIAGNOSIS — J189 Pneumonia, unspecified organism: Secondary | ICD-10-CM | POA: Diagnosis not present

## 2020-11-20 DIAGNOSIS — I10 Essential (primary) hypertension: Secondary | ICD-10-CM | POA: Diagnosis not present

## 2020-11-20 DIAGNOSIS — G9340 Encephalopathy, unspecified: Secondary | ICD-10-CM | POA: Diagnosis not present

## 2020-11-20 DIAGNOSIS — R569 Unspecified convulsions: Secondary | ICD-10-CM | POA: Diagnosis not present

## 2020-11-20 LAB — CSF CELL COUNT WITH DIFFERENTIAL
RBC Count, CSF: 106 /mm3 — ABNORMAL HIGH
RBC Count, CSF: 7 /mm3 — ABNORMAL HIGH
RBC Count, CSF: 4 /mm3 — ABNORMAL HIGH
Tube #: 1
Tube #: 3
Tube #: 4
WBC, CSF: 1 /mm3 (ref 0–5)
WBC, CSF: 2 /mm3 (ref 0–5)
WBC, CSF: 1 /mm3 (ref 0–5)

## 2020-11-20 LAB — COMPREHENSIVE METABOLIC PANEL
ALT: 14 U/L (ref 0–44)
AST: 29 U/L (ref 15–41)
Albumin: 2.4 g/dL — ABNORMAL LOW (ref 3.5–5.0)
Alkaline Phosphatase: 69 U/L (ref 38–126)
Anion gap: 8 (ref 5–15)
BUN: 21 mg/dL (ref 8–23)
CO2: 21 mmol/L — ABNORMAL LOW (ref 22–32)
Calcium: 7.9 mg/dL — ABNORMAL LOW (ref 8.9–10.3)
Chloride: 112 mmol/L — ABNORMAL HIGH (ref 98–111)
Creatinine, Ser: 1.26 mg/dL — ABNORMAL HIGH (ref 0.44–1.00)
GFR, Estimated: 45 mL/min — ABNORMAL LOW (ref >60)
Glucose, Bld: 135 mg/dL — ABNORMAL HIGH (ref 70–99)
Potassium: 3.3 mmol/L — ABNORMAL LOW (ref 3.5–5.1)
Sodium: 141 mmol/L (ref 135–145)
Total Bilirubin: 1.2 mg/dL (ref 0.3–1.2)
Total Protein: 4.8 g/dL — ABNORMAL LOW (ref 6.5–8.1)

## 2020-11-20 LAB — GLUCOSE, CAPILLARY
Glucose-Capillary: 106 mg/dL — ABNORMAL HIGH (ref 70–99)
Glucose-Capillary: 110 mg/dL — ABNORMAL HIGH (ref 70–99)
Glucose-Capillary: 115 mg/dL — ABNORMAL HIGH (ref 70–99)
Glucose-Capillary: 135 mg/dL — ABNORMAL HIGH (ref 70–99)
Glucose-Capillary: 139 mg/dL — ABNORMAL HIGH (ref 70–99)
Glucose-Capillary: 93 mg/dL (ref 70–99)
Glucose-Capillary: 95 mg/dL (ref 70–99)

## 2020-11-20 LAB — CBC
HCT: 36.8 % (ref 36.0–46.0)
Hemoglobin: 12.1 g/dL (ref 12.0–15.0)
MCH: 30.4 pg (ref 26.0–34.0)
MCHC: 32.9 g/dL (ref 30.0–36.0)
MCV: 92.5 fL (ref 80.0–100.0)
Platelets: 130 10*3/uL — ABNORMAL LOW (ref 150–400)
RBC: 3.98 MIL/uL (ref 3.87–5.11)
RDW: 14.2 % (ref 11.5–15.5)
WBC: 11.1 10*3/uL — ABNORMAL HIGH (ref 4.0–10.5)
nRBC: 0 % (ref 0.0–0.2)

## 2020-11-20 LAB — URINE CULTURE

## 2020-11-20 LAB — PROTEIN, CSF: Total  Protein, CSF: 43 mg/dL (ref 15–45)

## 2020-11-20 LAB — GLUCOSE, CSF: Glucose, CSF: 69 mg/dL (ref 40–70)

## 2020-11-20 LAB — VITAMIN B12: Vitamin B-12: 190 pg/mL (ref 180–914)

## 2020-11-20 LAB — TSH: TSH: 1.288 u[IU]/mL (ref 0.350–4.500)

## 2020-11-20 MED ORDER — CYANOCOBALAMIN 1000 MCG/ML IJ SOLN
1000.0000 ug | Freq: Once | INTRAMUSCULAR | Status: AC
  Administered 2020-11-20: 1000 ug via INTRAMUSCULAR
  Filled 2020-11-20: qty 1

## 2020-11-20 MED ORDER — LORAZEPAM 2 MG/ML IJ SOLN: 1.0000 mg | Freq: Once | INTRAMUSCULAR | Status: AC | PRN

## 2020-11-20 MED ORDER — THIAMINE HCL 100 MG/ML IJ SOLN
500.0000 mg | Freq: Once | INTRAVENOUS | Status: AC
  Administered 2020-11-20: 500 mg via INTRAVENOUS
  Filled 2020-11-20: qty 5

## 2020-11-20 MED ORDER — LORAZEPAM 2 MG/ML IJ SOLN
INTRAMUSCULAR | Status: AC
  Administered 2020-11-20: 1 mg via INTRAVENOUS
  Filled 2020-11-20: qty 1

## 2020-11-20 MED ORDER — LIDOCAINE HCL (PF) 1 % IJ SOLN
5.0000 mL | Freq: Once | INTRAMUSCULAR | Status: AC
  Administered 2020-11-20: 5 mL via INTRADERMAL

## 2020-11-20 MED ORDER — THIAMINE HCL 100 MG/ML IJ SOLN
100.0000 mg | INTRAMUSCULAR | Status: DC
  Administered 2020-11-21 – 2020-11-22 (×2): 100 mg via INTRAVENOUS
  Filled 2020-11-20 (×2): qty 2

## 2020-11-20 MED ORDER — SODIUM CHLORIDE 0.9 % IV SOLN
750.0000 mg | Freq: Two times a day (BID) | INTRAVENOUS | Status: DC
  Administered 2020-11-20: 750 mg via INTRAVENOUS
  Filled 2020-11-20 (×3): qty 7.5

## 2020-11-20 NOTE — Procedures (Signed)
Technically successful L3/L4 image-guide lumbar puncture yielding 12 ml of clear CSF for lab studies. Please see full dictation in Epic under Imaging tab.  Soyla Dryer, [place] [phone] 11/20/2020, 1:25 PM

## 2020-11-20 NOTE — Procedures (Signed)
Patient Name: NIKAYLA MADARIS  MRN: [id number]  Epilepsy Attending: Lora Havens  Referring Physician/Provider: Dr Roland Rack Date: 11/20/2020 Duration: 23.56 mins  Patient history: 75yo F with new onset seizure. EEG to evaluate for seizure.  Level of alertness: Awake, asleep  AEDs during EEG study: LEV  Technical aspects: This EEG study was done with scalp electrodes positioned according to the 10-20 International system of electrode placement. Electrical activity was acquired at a sampling rate of 500Hz  and reviewed with a high frequency filter of 70Hz  and a low frequency filter of 1Hz . EEG data were recorded continuously and digitally stored.   Description: The posterior dominant rhythm consists of 9 Hz activity of moderate voltage (25-35 uV) seen predominantly in posterior head regions, symmetric and reactive to eye opening and eye closing. Sleep was characterized by vertex waves, sleep spindles (12 to 14 Hz), maximal frontocentral region.  Hyperventilation and photic stimulation were not performed.     IMPRESSION: This study is within normal limits. No seizures or epileptiform discharges were seen throughout the recording.  Masashi Snowdon Barbra Sarks

## 2020-11-20 NOTE — Progress Notes (Signed)
Neurology Progress Note  Brief HPI: 75 y.o. female with PMHx of morbid obesity, endometrial carcinoma s/p hysterectomy and local radiation with brachytherapy, DM2, anxiety, CKD III, GERD, glaucoma, parafalcine subdural hemorrhage (05/2020, managed conservatively), HLD, HTN, and OSA not on CPAP who presented to the ED 8/20 via EMS for evaluation of unresponsiveness at home. Husband noted that she appeared confused and withdrawn that morning and trouble with word-finding before swaying her arms over her head and then her arms became rigid. EMS was activated and on arrival visualized her to be unresponsive with a right gaze, fixed pupils, and noted respiratory distress with concern for seizure activity and was given 10 mg IM Versed. While in CT, she became restless and was given 1g of Keppra for concern for seizure.  Regarding her malignancy history, per last oncology note on 07/25/2020, she has a stage Ib grade 1 endometrioid endometrial adenocarcinoma, and she had robotic assisted total hysterectomy BSO (09/30/2017, staging not performed due to morbid obesity) completed vaginal brachytherapy in September 2019.  She is having surveillance visits currently, scheduled to stop September 2024.  On chart review, her weight has been essentially stable since April 20, 2013, based on weights documented in office notes  Subjective: One episode of hypoglycemia overnight with a CBG of 66 s/p dextrose administration No further overnight events  Exam: Vitals:   11/20/20 0337 11/20/20 0400  BP: (!) 168/58 (!) 164/67  Pulse:  74  Resp:  19  Temp: 98.4 F (36.9 C)   SpO2:  97%   Gen: Laying comfortably in bed, some restless movements noted, in no acute distress Neck: Supple, full ROM present without tenderness Resp: non-labored breathing, no respiratory distress Abd: soft, non-tender, non-distended  Neuro: Mental Status: Awake, alert to self only. She states that she will be 75 in one month (correct) and  then states "I will be 70 in about one month". She incorrectly states that the year is 20, the month is "75, September", and that she is in [facility]. When asked who Mendel Ryder is she states "Mendel Ryder is the girl who told me her name". When asked if patient knew why she was in the hospital she states "I retired last year and I keep hitting 6 which makes it 13 or so, 11, I don't know". She is confabulating and perseverating on numbers and retirement.  Speech is mildly dysarthric.  No neglect is noted.  Cranial Nerves: PERRL, EOMI without ptosis, visual fields are full, face appears symmetric resting and with movement, hearing is intact to voice, phonation is normal, palate elevates symmetrically, shoulders shrug symmetrically, tongue protrudes midline.  Motor: Moves all extremities spontaneously and with antigravity movement without asymmetry.  Sensory: Unable to assess due to patient with unreliable reporting; patient possibly reports decreased sensation to left upper extremity with light touch as she describes the left side as "dry" and the right side as "smooth". When asked if the left side is less to light touch she states "yes" DTR: 2+ patellae, 2+ biceps and brachioradialis Gait: Deferred  Pertinent Labs:  CBC    Component Value Date/Time   WBC 11.1 (H) 11/20/2020 0449   RBC 3.98 11/20/2020 0449   HGB 12.1 11/20/2020 0449   HCT 36.8 11/20/2020 0449   PLT 130 (L) 11/20/2020 0449   MCV 92.5 11/20/2020 0449   MCH 30.4 11/20/2020 0449   MCHC 32.9 11/20/2020 0449   RDW 14.2 11/20/2020 0449   LYMPHSABS 2.1 11/18/2020 1656   MONOABS 0.6 11/18/2020 1656  EOSABS 0.1 11/18/2020 1656   BASOSABS 0.1 11/18/2020 1656   CMP     Component Value Date/Time   NA 141 11/20/2020 0449   K 3.3 (L) 11/20/2020 0449   CL 112 (H) 11/20/2020 0449   CO2 21 (L) 11/20/2020 0449   GLUCOSE 135 (H) 11/20/2020 0449   BUN 21 11/20/2020 0449   CREATININE 1.26 (H) 11/20/2020 0449   CALCIUM 7.9 (L)  11/20/2020 0449   PROT 4.8 (L) 11/20/2020 0449   ALBUMIN 2.4 (L) 11/20/2020 0449   AST 29 11/20/2020 0449   ALT 14 11/20/2020 0449   ALKPHOS 69 11/20/2020 0449   BILITOT 1.2 11/20/2020 0449   GFRNONAA 45 (L) 11/20/2020 0449   GFRAA 48 (L) 10/01/2017 0527   Urinalysis    Component Value Date/Time   COLORURINE YELLOW 11/18/2020 2121   APPEARANCEUR HAZY (A) 11/18/2020 2121   LABSPEC 1.012 11/18/2020 2121   PHURINE 7.0 11/18/2020 2121   GLUCOSEU >=500 (A) 11/18/2020 2121   HGBUR SMALL (A) 11/18/2020 2121   BILIRUBINUR NEGATIVE 11/18/2020 2121   KETONESUR NEGATIVE 11/18/2020 2121   PROTEINUR 100 (A) 11/18/2020 2121   NITRITE NEGATIVE 11/18/2020 2121   LEUKOCYTESUR NEGATIVE 11/18/2020 2121   Drugs of Abuse     Component Value Date/Time   LABOPIA NONE DETECTED 11/18/2020 2121   COCAINSCRNUR NONE DETECTED 11/18/2020 2121   LABBENZ POSITIVE (A) 11/18/2020 2121   AMPHETMU NONE DETECTED 11/18/2020 2121   THCU NONE DETECTED 11/18/2020 2121   LABBARB NONE DETECTED 11/18/2020 2121   Lab Results  Component Value Date   VITAMINB12 190 11/20/2020   Lab Results  Component Value Date   TSH 1.288 11/20/2020   No results found for: FOLATE  Imaging Reviewed:  MRI brain [place] contrast 11/19/2020, personally reviewed by attending MD, agree with radiology: 1. No acute intracranial abnormality. 2. White matter changes that may be due to chronic small vessel disease or a sequela of cancer treatment.  CT Head 11/18/2020: There is no acute intracranial hemorrhage or evidence of acute infarction within limitation of significant motion artifact on many slices. ASPECT score is 10.   Assessment: 75 yo F with new onset progressive confusion and seizures. Etiology is unclear, hence need for LP.  Stroke is certainly one possibility, but her exam is slightly bizarre and I think that LP is still needed even though she has not been febrile. I agree with acyclovir pending LP. I think bacterial meningitis  unlikely- neck is supple, no tenderness noted though she does have a mild leukocytosis with a WBC count of 11.1 this morning and with a temperature maximum of 100.9 degrees. Urine culture obtained with multiple species, concern for contamination, recommend recollection. UA with many bacteria but without evidence of leukocytes or nitrites.  MRI Brain has been obtained without acute intracranial abnormality, EEG remains pending.   Impression:  Acute encephalopathy with confusion; etiology unclear at this time  Recommendations: - Vitamin B1, folate, MMA, ammonia levels pending - Recommend Vitamin B12 supplementation due to serum B12 level of 190. Goal of > 500 for neurologic standards --MMA to confirm functional depletion of B12 is pending - Initiated Vitamin B12 supplementation with 1,000 mcg IM once  - Thiamine supplementation initiated with 500 mg IV once followed by 100 mg IV daily, may be discontinued if level results normal (typically takes 2 to 3 days) - Fluoro-guided LP pending, to be completed today with CSF labs pending: cells, protein, glucose, HSV - Continue Acyclovir at this time, duration decision pending  CSF results, specifically HSV 1/2 PCR - EEG pending - Reduce Keppra to 750 mg BID for now given current CrCl Estimated Creatinine Clearance: 43.5 mL/min (A) (by C-G formula based on SCr of 1.26 mg/dL (H)).   CrCl 80 to 130 mL/minute/1.73 m2: 500 mg to 1.5 g every 12 hours.  CrCl 50 to <80 mL/minute/1.73 m2: 500 mg to 1 g every 12 hours.  CrCl 30 to <50 mL/minute/1.73 m2: 250 to 750 mg every 12 hours.  CrCl 15 to <30 mL/minute/1.73 m2: 250 to 500 mg every 12 hours.  CrCl <15 mL/minute/1.73 m2: 250 to 500 mg every 24 hours (expert opinion). - Continue inpatient seizure precautions - Neurology will continue to follow  Anibal Henderson, AGACNP-BC [facility] [phone]  Attending Neurologist's note:  I personally saw this patient, gathering history, performing a full  neurologic examination, reviewing relevant labs, personally reviewing relevant imaging including MRI brain, and formulated the assessment and plan, adding the note above for completeness and clarity to accurately reflect my thoughts   Additional data reviewed: EEG:  Description: The posterior dominant rhythm consists of 9 Hz activity of moderate voltage (25-35 uV) seen predominantly in posterior head regions, symmetric and reactive to eye opening and eye closing. Sleep was characterized by vertex waves, sleep spindles (12 to 14 Hz), maximal frontocentral region.  Hyperventilation and photic stimulation were not performed.    IMPRESSION: This study is within normal limits. No seizures or epileptiform discharges were seen throughout the recording.   CSF studies notable for cell counts and tubes 1, 3, 4: RBC 106, 7, 4, WBC 1, 2, 1.  CSF protein 43, CSF glucose 69 (serum glucose 139).  Opening pressure 13 cmH2O  Additional assessment/plan This preliminary studies are reassuring against acute infection still on the differential at this time.  We will continue to follow, with further recommendations pending clarification of time course of her symptoms with family and further serial examinations  Lesleigh Noe MD-PhD [facility] [phone]  Available 7 AM to 7 PM, outside these hours please contact Neurologist on call listed on AMION

## 2020-11-20 NOTE — Progress Notes (Signed)
EEG complete - results pending 

## 2020-11-20 NOTE — Progress Notes (Signed)
Inpatient Diabetes Program Recommendations  AACE/ADA: New Consensus Statement on Inpatient Glycemic Control (2015)  Target Ranges:  Prepandial:   less than 140 mg/dL      Peak postprandial:   less than 180 mg/dL (1-2 hours)      Critically ill patients:  140 - 180 mg/dL   Lab Results  Component Value Date   GLUCAP 135 (H) 11/20/2020   HGBA1C 7.2 (H) 11/19/2020    Review of Glycemic Control Results for Elizabeth Crosby, Elizabeth Crosby (MRN [id number]) as of 11/20/2020 11:01  Ref. Range 11/19/2020 20:52 11/20/2020 00:11 11/20/2020 00:34 11/20/2020 03:35 11/20/2020 08:15  Glucose-Capillary Latest Ref Range: 70 - 99 mg/dL 66 (L) 106 (H) 93 110 (H) 135 (H)   Diabetes history: DM 2 Outpatient Diabetes medications:  Humalog 10 units with supper Tresiba 40 units daily with breakfast Current orders for Inpatient glycemic control:  Novolog sensitive q 4 hours Semglee 20 units daily  Inpatient Diabetes Program Recommendations:    Consider reducing Semglee to 15 units daily.  Thanks,  Adah Perl, RN, BC-ADM Inpatient Diabetes Coordinator Pager [phone]  (8a-5p)

## 2020-11-20 NOTE — Progress Notes (Addendum)
PROGRESS NOTE    JACIA SICKMAN  GUY:[id number] DOB: 1945-06-15 DOA: 11/18/2020 PCP: [name], MD   Brief Narrative: Elizabeth Crosby is a 75 y.o.  female with medical history significant of endometrial CA s/p radiation, DM2, HTN, OSA. Patient presented secondary to altered mental status and seizure witness during EMS transportation. SHe was found to have a LLL pneumonia and was started on antibiotics. Neurology consulted with recommendation for LP studies and EEG.   Assessment & Plan:   Principal Problem:   Acute encephalopathy Active Problems:   Seizure ([place])   DM2 (diabetes mellitus, type 2) (H[facility])   HTN (hypertension)   Left lower lobe pneumonia   Acute encephalopathy Seizures Unclear etiology. Neurology consulted with recommendation for LP; initial attempt failed. MRI brain without acute process. Fever with Tmax of 100.9 F on 8/21. -Neurology recommendations: acyclovir IV; Keppra, LP for cells, protein, glucose, HSV; EEG.  Left lower lobe pneumonia Started on Ceftriaxone IV and azithromycin IV. Blood cultures ordered on admission but do not appear to have been collected. -Continue Ceftriaxone/azithromycin -Re-order blood cultures  Diabetes mellitus, type 2 Patient is on Tresiba 40 units daily and Humalog 10 units daily with supper as an outpatient. Started on SSI while inpatient -Continue SSI for now  Primary hypertension Patient is on telmesartan, hydrochlorothiazide as an outpatient.   DVT prophylaxis: Lovenox Code Status:   Code Status: Full Code Family Communication: None at bedside. Called husband but no reply. Left voice mail Disposition Plan: Discharge home vs SNF likely in several days pending neuro workup/management   Consultants:  Neurology  Procedures:  None  Antimicrobials: Ceftriaxone Azithromycin Acyclovir    Subjective: Patient unable to verbalize concerns well. Talks mostly about the time.  Objective: Vitals:   11/20/20 0015  11/20/20 0337 11/20/20 0400 11/20/20 0800  BP: (!) 142/61 (!) 168/58 (!) 164/67 (!) 156/71  Pulse: 81  74 83  Resp:   19 (!) 29  Temp: 98.6 F (37 C) 98.4 F (36.9 C)  98.1 F (36.7 C)  TempSrc: Oral Oral  Oral  SpO2:   97% 96%  Weight:      Height:        Intake/Output Summary (Last 24 hours) at 11/20/2020 1223 Last data filed at 11/20/2020 5638 Gross per 24 hour  Intake 2206.09 ml  Output 1 ml  Net 2205.09 ml    Filed Weights   11/18/20 2330  Weight: 104.3 kg    Examination:  General exam: Appears calm and comfortable Respiratory system: Clear to auscultation. Respiratory effort normal. Cardiovascular system: S1 & S2 heard, RRR. No murmurs, rubs, gallops or clicks. Gastrointestinal system: Abdomen is nondistended, soft and nontender. No organomegaly or masses felt. Normal bowel sounds heard. Central nervous system: Alert and oriented to person. Knows the name of her husband. For all other questions, she responded with references to different numbers. Follows some commands. She did thank me for saying bless after she sneezed. No focal neurological deficits. Musculoskeletal: No edema. No calf tenderness Skin: No cyanosis. No rashes Psychiatry: Judgement and insight appear impaired.    Data Reviewed: I have personally reviewed following labs and imaging studies  CBC Lab Results  Component Value Date   WBC 11.1 (H) 11/20/2020   RBC 3.98 11/20/2020   HGB 12.1 11/20/2020   HCT 36.8 11/20/2020   MCV 92.5 11/20/2020   MCH 30.4 11/20/2020   PLT 130 (L) 11/20/2020   MCHC 32.9 11/20/2020   RDW 14.2 11/20/2020   LYMPHSABS 2.1  11/18/2020   MONOABS 0.6 11/18/2020   EOSABS 0.1 11/18/2020   BASOSABS 0.1 54/62/7035     Last metabolic panel Lab Results  Component Value Date   NA 141 11/20/2020   K 3.3 (L) 11/20/2020   CL 112 (H) 11/20/2020   CO2 21 (L) 11/20/2020   BUN 21 11/20/2020   CREATININE 1.26 (H) 11/20/2020   GLUCOSE 135 (H) 11/20/2020   GFRNONAA 45 (L)  11/20/2020   GFRAA 48 (L) 10/01/2017   CALCIUM 7.9 (L) 11/20/2020   PROT 4.8 (L) 11/20/2020   ALBUMIN 2.4 (L) 11/20/2020   BILITOT 1.2 11/20/2020   ALKPHOS 69 11/20/2020   AST 29 11/20/2020   ALT 14 11/20/2020   ANIONGAP 8 11/20/2020    CBG (last 3)  Recent Labs    11/20/20 0335 11/20/20 0815 11/20/20 1132  GLUCAP 110* 135* 139*      GFR: Estimated Creatinine Clearance: 43.5 mL/min (A) (by C-G formula based on SCr of 1.26 mg/dL (H)).  Coagulation Profile: Recent Labs  Lab 11/18/20 1656  INR 1.1     Recent Results (from the past 240 hour(s))  Resp Panel by RT-PCR (Flu A&B, Covid) Nasopharyngeal Swab     Status: None   Collection Time: 11/18/20  4:59 PM   Specimen: Nasopharyngeal Swab; Nasopharyngeal(NP) swabs in vial transport medium  Result Value Ref Range Status   SARS Coronavirus 2 by RT PCR NEGATIVE NEGATIVE Final   Influenza A by PCR NEGATIVE NEGATIVE Final   Influenza B by PCR NEGATIVE NEGATIVE Final    Comment: (NOTE) The Xpert Xpress SARS-CoV-2/FLU/RSV plus assay is intended as an aid in the diagnosis of influenza from Nasopharyngeal swab specimens and should not be used as a sole basis for treatment. Nasal washings and aspirates are unacceptable for Xpert Xpress SARS-CoV-2/FLU/RSV testing.  Fact Sheet for Patients: EntrepreneurPulse.com.au  Fact Sheet for Healthcare Providers: IncredibleEmployment.be  This test is not yet approved or cleared by the [place] FDA and has been authorized for detection and/or diagnosis of SARS-CoV-2 by FDA under an Emergency Use Authorization (EUA). This EUA will remain in effect (meaning this test can be used) for the duration of the COVID-19 declaration under Section 564(b)(1) of the Act, 21 U.S.C. section 360bbb-3(b)(1), unless the authorization is terminated or revoked.  Performed at [facility], [place] [address] [place], [place] [address]   Urine Culture      Status: Abnormal   Collection Time: 11/19/20  9:52 AM   Specimen: Urine, Clean Catch  Result Value Ref Range Status   Specimen Description URINE, CLEAN CATCH  Final   Special Requests   Final    NONE Performed at [facility], [place] [address] [place], [place] [address]    Culture MULTIPLE SPECIES PRESENT, SUGGEST RECOLLECTION (A)  Final   Report Status 11/20/2020 FINAL  Final  MRSA Next Gen by PCR, Nasal     Status: None   Collection Time: 11/19/20  4:23 PM   Specimen: Nasal Mucosa; Nasal Swab  Result Value Ref Range Status   MRSA by PCR Next Gen NOT DETECTED NOT DETECTED Final    Comment: (NOTE) The GeneXpert MRSA Assay (FDA approved for NASAL specimens only), is one component of a comprehensive MRSA colonization surveillance program. It is not intended to diagnose MRSA infection nor to guide or monitor treatment for MRSA infections. Test performance is not FDA approved in patients less than 28 years old. Performed at [facility], [place] [address],  [place], [place] [address]          Radiology Studies: DG Chest 2 View  Result Date: 11/18/2020 CLINICAL DATA:  Altered level of consciousness EXAM: CHEST - 2 VIEW COMPARISON:  11/18/2020 at 6:39 p.m. FINDINGS: Frontal and lateral views of the chest demonstrate a stable cardiac silhouette. Left basilar airspace disease is identified, consistent with asymmetric edema, aspiration, or infection. No effusion or pneumothorax. No acute bony abnormalities. IMPRESSION: 1. Left basilar airspace disease, favor aspiration or infection. Electronically Signed   By: Randa Ngo M.D.   On: 11/18/2020 22:51   MR BRAIN W WO CONTRAST  Result Date: 11/19/2020 CLINICAL DATA:  Encephalopathy EXAM: MRI HEAD WITHOUT AND WITH CONTRAST TECHNIQUE: Multiplanar, multiecho pulse sequences of the brain and surrounding structures were obtained without and with intravenous contrast. CONTRAST:  74mL GADAVIST GADOBUTROL 1 MMOL/ML IV SOLN COMPARISON:   None. FINDINGS: Brain: No acute infarct, mass effect or extra-axial collection. No acute or chronic hemorrhage. There is multifocal hyperintense T2-weighted signal within the white matter. Parenchymal volume and CSF spaces are normal. The midline structures are normal. There is no abnormal contrast enhancement. Vascular: Major flow voids are preserved. Skull and upper cervical spine: Normal calvarium and skull base. Visualized upper cervical spine and soft tissues are normal. Sinuses/Orbits:No paranasal sinus fluid levels or advanced mucosal thickening. No mastoid or middle ear effusion. Normal orbits. IMPRESSION: 1. No acute intracranial abnormality. 2. White matter changes that may be due to chronic small vessel disease or a sequela of cancer treatment. Electronically Signed   By: Ulyses Jarred M.D.   On: 11/19/2020 00:53   DG Chest Portable 1 View  Result Date: 11/18/2020 CLINICAL DATA:  Altered mental status EXAM: PORTABLE CHEST 1 VIEW COMPARISON:  07/31/2020 FINDINGS: The patient is moderately rotated on this examination. Opacification of the left lung base is related to, in part, overlying soft tissue. However, a superimposed asymmetric interstitial pulmonary infiltrate is noted at the left lung base, possibly infectious in the appropriate clinical setting. No pneumothorax or pleural effusion. Cardiac size within normal limits. No acute bone abnormality. IMPRESSION: Technically limited examination with suspected left basilar focal pulmonary infiltrate, possibly infectious in the acute setting. This could be confirmed with a standard two view chest radiograph, if indicated. Electronically Signed   By: Fidela Salisbury M.D.   On: 11/18/2020 19:00   CT HEAD CODE STROKE WO CONTRAST  Result Date: 11/18/2020 CLINICAL DATA:  Code stroke.  Neuro deficit, acute, stroke suspected EXAM: CT HEAD WITHOUT CONTRAST TECHNIQUE: Contiguous axial images were obtained from the base of the skull through the vertex without  intravenous contrast. COMPARISON:  05/10/2020 FINDINGS: Significant motion artifact is present. Findings below are within this limitation. Brain: No acute intracranial hemorrhage, mass effect, or edema. No new loss of gray-white differentiation. No hydrocephalus or extra-axial collection. Vascular: No hyperdense vessel. Skull: Unremarkable. Sinuses/Orbits: No acute finding. Other: Mastoid air cells are clear. ASPECTS ([place] Stroke Program Early CT Score) - Ganglionic level infarction (caudate, lentiform nuclei, internal capsule, insula, M1-M3 cortex): 7 - Supraganglionic infarction (M4-M6 cortex): 3 Total score (0-10 with 10 being normal): 10 IMPRESSION: There is no acute intracranial hemorrhage or evidence of acute infarction within limitation of significant motion artifact on many slices. ASPECT score is 10. These results were communicated to Dr. Leonel Ramsay at 5:24 pm on 11/18/2020 by text page via the [facility] messaging system. Electronically Signed   By: Macy Mis M.D.   On: 11/18/2020 17:26  Scheduled Meds:  enoxaparin (LOVENOX) injection  40 mg Subcutaneous Q24H   insulin aspart  0-9 Units Subcutaneous Q4H   insulin glargine-yfgn  20 Units Subcutaneous Daily   Continuous Infusions:  acyclovir 705 mg (11/20/20 0857)   azithromycin 500 mg (11/19/20 2210)   cefTRIAXone (ROCEPHIN)  IV 2 g (11/20/20 0032)   dextrose 5 % and 0.9% NaCl 125 mL/hr at 11/20/20 0853   levETIRAcetam 1,000 mg (11/20/20 0905)     LOS: 1 day     Cordelia Poche, MD [facility] 11/20/2020, 12:23 PM  If 7PM-7AM, please contact night-coverage [email or web address]

## 2020-11-21 ENCOUNTER — Inpatient Hospital Stay (HOSPITAL_COMMUNITY): Payer: Medicare Other

## 2020-11-21 DIAGNOSIS — G9340 Encephalopathy, unspecified: Secondary | ICD-10-CM | POA: Diagnosis not present

## 2020-11-21 DIAGNOSIS — R569 Unspecified convulsions: Secondary | ICD-10-CM | POA: Diagnosis not present

## 2020-11-21 DIAGNOSIS — J189 Pneumonia, unspecified organism: Secondary | ICD-10-CM | POA: Diagnosis not present

## 2020-11-21 DIAGNOSIS — I10 Essential (primary) hypertension: Secondary | ICD-10-CM | POA: Diagnosis not present

## 2020-11-21 LAB — BASIC METABOLIC PANEL
Anion gap: 5 (ref 5–15)
BUN: 16 mg/dL (ref 8–23)
CO2: 22 mmol/L (ref 22–32)
Calcium: 7.6 mg/dL — ABNORMAL LOW (ref 8.9–10.3)
Chloride: 114 mmol/L — ABNORMAL HIGH (ref 98–111)
Creatinine, Ser: 1.25 mg/dL — ABNORMAL HIGH (ref 0.44–1.00)
GFR, Estimated: 45 mL/min — ABNORMAL LOW (ref >60)
Glucose, Bld: 198 mg/dL — ABNORMAL HIGH (ref 70–99)
Potassium: 3.3 mmol/L — ABNORMAL LOW (ref 3.5–5.1)
Sodium: 141 mmol/L (ref 135–145)

## 2020-11-21 LAB — GLUCOSE, CAPILLARY
Glucose-Capillary: 123 mg/dL — ABNORMAL HIGH (ref 70–99)
Glucose-Capillary: 123 mg/dL — ABNORMAL HIGH (ref 70–99)
Glucose-Capillary: 126 mg/dL — ABNORMAL HIGH (ref 70–99)
Glucose-Capillary: 132 mg/dL — ABNORMAL HIGH (ref 70–99)
Glucose-Capillary: 151 mg/dL — ABNORMAL HIGH (ref 70–99)
Glucose-Capillary: 170 mg/dL — ABNORMAL HIGH (ref 70–99)
Glucose-Capillary: 98 mg/dL (ref 70–99)

## 2020-11-21 LAB — LEGIONELLA PNEUMOPHILA SEROGP 1 UR AG: L. pneumophila Serogp 1 Ur Ag: NEGATIVE

## 2020-11-21 LAB — AMMONIA: Ammonia: 48 umol/L — ABNORMAL HIGH (ref 9–35)

## 2020-11-21 NOTE — TOC Initial Note (Signed)
Transition of Care [facility] - Initial/Assessment Note    Patient Details  Name: Elizabeth Crosby MRN: [id number] Date of Birth: Oct 02, 1945  [facility] CM/SW Contact:    Joanne Chars, LCSW Phone Number: 11/21/2020, 1:22 PM  Clinical Narrative:     CSW met with pt regarding discharge recommendation for SNF.  Pt unable to engage in conversation, does give permission for CSW to speak with husband [name] and daughter [name].    CSW spoke with daughter [name], who reports pt lives at home with husband and step daughter.  Daughter reports husband has some issues keeping information straight currently and would like to be kept informed.  She is in agreement with plan for SNF, believes husband will agree as well. Discussed process and SNF choice, CSW to leave choice document in room and daughter will be there later today, would like to look at [facility] and [facility] options.  Pt has been vaccinated for covid with one booster.  Current DME in home: rollator, cane.    CSW brought choice documents for [facility] and [place] to room and pt husband now present, discussed SNF recommendation with him and he is in agreement, "whatever she needs."  Explained choice.                Expected Discharge Plan: Skilled Nursing Facility Barriers to Discharge: Continued Medical Work up, SNF Pending bed offer   Patient Goals and CMS Choice   CMS Medicare.gov Compare Post Acute Care list provided to:: Patient Represenative (must comment) Choice offered to / list presented to : Spouse  Expected Discharge Plan and Services Expected Discharge Plan: [place] Choice: [place] arrangements for the past 2 months: Single Family Home                                      Prior Living Arrangements/Services Living arrangements for the past 2 months: Single Family Home Lives with:: Adult Children, Spouse Patient language and need for  interpreter reviewed:: Yes Do you feel safe going back to the place where you live?: Yes      Need for Family Participation in Patient Care: Yes (Comment) Care giver support system in place?: Yes (comment) Current home services: Other (comment) (none) Criminal Activity/Legal Involvement Pertinent to Current Situation/Hospitalization: No - Comment as needed  Activities of Daily Living Home Assistive Devices/Equipment: Walker (specify type) ADL Screening (condition at time of admission) Patient's cognitive ability adequate to safely complete daily activities?: No Is the patient deaf or have difficulty hearing?: No Does the patient have difficulty seeing, even when wearing glasses/contacts?: No Does the patient have difficulty concentrating, remembering, or making decisions?: Yes Patient able to express need for assistance with ADLs?: Yes Does the patient have difficulty dressing or bathing?: Yes Independently performs ADLs?: No Does the patient have difficulty walking or climbing stairs?: Yes Weakness of Legs: Both Weakness of Arms/Hands: Both  Permission Sought/Granted Permission sought to share information with : Family Supports Permission granted to share information with : Yes, Verbal Permission Granted  Share Information with NAME: husband [name], daughter [name]  Permission granted to share info w AGENCY: SNF        Emotional Assessment Appearance:: Appears stated age Attitude/Demeanor/Rapport: Other (comment) (pleasantly confused) Affect (typically observed): Pleasant Orientation: : Oriented to Self, Oriented to Place Alcohol / Substance Use: Not Applicable  Psych Involvement: No (comment)  Admission diagnosis:  Acute encephalopathy [G93.40] Patient Active Problem List   Diagnosis Date Noted   Seizure ([place]) 11/18/2020   Acute encephalopathy 11/18/2020   DM2 (diabetes mellitus, type 2) ([place]) 11/18/2020   HTN (hypertension) 11/18/2020   Left lower lobe pneumonia 11/18/2020    History of heart valve abnormality 10/24/2017   Postmenopausal bleeding 09/30/2017   Pelvic mass 09/30/2017   Morbid obesity ([place]) 09/30/2017   Endometrial cancer ([place]) 09/30/2017   Pelvic mass in female 09/30/2017   Acute rhinitis 07/04/2013   Bronchitis, subacute 05/29/2013   GERD (gastroesophageal reflux disease) 05/29/2013   PCP:  [name], MD Pharmacy:   [id number] [id number]: [phone]: [phone]    Social Determinants of Health (SDOH) Interventions    Readmission Risk Interventions No flowsheet data found.

## 2020-11-21 NOTE — Evaluation (Signed)
Physical Therapy Evaluation Patient Details Name: Elizabeth Crosby MRN: [id number] DOB: [birth year] Today's Date: 11/21/2020   History of Present Illness  75yo female who presented to the ED on 11/18/20 with progressive AMS, and had seizure on route to the hospital. Admitted with acute encephalopathy, seizures, and LLL PNA. PMH CKD, DM, gout, HLD, HTN, lumbar pain, endometrial CA, SDH  Clinical Impression   Patient received in bed, pleasantly confused and tangential, requiring frequent redirection to stay on task. Able to get to EOB with ModA, however unable to attempt transfers or gait due to extreme hypertension with position changes (see below). Returned to supine and left positioned to comfort with all needs met, bed alarm active and mitts replaced, nursing staff aware of patient status.   BP supine 183/75 BP sitting EOB 260/114 BP return to supine 173/63    Follow Up Recommendations SNF;Supervision/Assistance - 24 hour    Equipment Recommendations  Rolling walker with 5" wheels;3in1 (PT);Wheelchair (measurements PT);Wheelchair cushion (measurements PT) (bariatric equipment)    Recommendations for Other Services       Precautions / Restrictions Precautions Precautions: Fall;Other (comment) Precaution Comments: watch BPs Restrictions Weight Bearing Restrictions: No      Mobility  Bed Mobility Overal bed mobility: Needs Assistance Bed Mobility: Supine to Sit;Sit to Supine     Supine to sit: Mod assist Sit to supine: Mod assist   General bed mobility comments: ModA to power trunk up to upright at EOB, then [place] to manage BLEs with return to bed    Transfers                 General transfer comment: deferred- hypertensive  Ambulation/Gait             General Gait Details: deferred- hypertensive  Stairs            Wheelchair Mobility    Modified Rankin (Stroke Patients Only)       Balance Overall balance assessment: Needs  assistance Sitting-balance support: Bilateral upper extremity supported;Feet unsupported Sitting balance-Leahy Scale: Fair Sitting balance - Comments: light min guard for balance/safety, generally weak core                                     Pertinent Vitals/Pain Pain Assessment: Faces Faces Pain Scale: No hurt Pain Intervention(s): Limited activity within patient's tolerance;Monitored during session    Home Living Family/patient expects to be discharged to:: Unsure Living Arrangements: Spouse/significant other               Additional Comments: pt very poor historian- unable to give any details regarding PLOF or functional history    Prior Function                 Hand Dominance        Extremity/Trunk Assessment   Upper Extremity Assessment Upper Extremity Assessment: Generalized weakness    Lower Extremity Assessment Lower Extremity Assessment: Generalized weakness    Cervical / Trunk Assessment Cervical / Trunk Assessment: Kyphotic  Communication      Cognition Arousal/Alertness: Awake/alert Behavior During Therapy: Flat affect;WFL for tasks assessed/performed Overall Cognitive Status: Impaired/Different from baseline Area of Impairment: Orientation;Attention;Memory;Following commands;Safety/judgement;Awareness;Problem solving                 Orientation Level: Disoriented to;Time;Situation Current Attention Level: Sustained Memory: Decreased short-term memory Following Commands: Follows one step commands inconsistently;Follows one step commands with increased time  Safety/Judgement: Decreased awareness of safety;Decreased awareness of deficits Awareness: Intellectual Problem Solving: Slow processing;Decreased initiation;Difficulty sequencing;Requires verbal cues;Requires tactile cues General Comments: tells me it is 1920 and we just had pandemic, very unclear as to why she thinks she is here in the hospital; per chart, looks to  have had some form of AMS since SDH earlier this year. Perseverates on telling me about various topics not related to therapy.      General Comments General comments (skin integrity, edema, etc.): BP in supine 183/75, sitting EOB 260/114, return to supine 173/63    Exercises     Assessment/Plan    PT Assessment Patient needs continued PT services  PT Problem List Decreased strength;Decreased cognition;Decreased knowledge of use of DME;Obesity;Decreased activity tolerance;Decreased safety awareness;Decreased balance;Decreased knowledge of precautions;Decreased mobility;Decreased coordination       PT Treatment Interventions DME instruction;Balance training;Gait training;Stair training;Cognitive remediation;Functional mobility training;Patient/family education;Therapeutic activities;Therapeutic exercise;Wheelchair mobility training    PT Goals (Current goals can be found in the Care Plan section)  Acute Rehab PT Goals PT Goal Formulation: Patient unable to participate in goal setting Time For Goal Achievement: 12/05/20 Potential to Achieve Goals: Fair    Frequency Min 3X/week   Barriers to discharge        Co-evaluation               AM-PAC PT "6 Clicks" Mobility  Outcome Measure Help needed turning from your back to your side while in a flat bed without using bedrails?: A Little Help needed moving from lying on your back to sitting on the side of a flat bed without using bedrails?: A Lot Help needed moving to and from a bed to a chair (including a wheelchair)?: A Lot Help needed standing up from a chair using your arms (e.g., wheelchair or bedside chair)?: A Lot Help needed to walk in hospital room?: Total Help needed climbing 3-5 steps with a railing? : Total 6 Click Score: 11    End of Session   Activity Tolerance: Treatment limited secondary to medical complications (Comment);Other (comment) (hypertension) Patient left: in bed;with call bell/phone within  reach;with bed alarm set Nurse Communication: Mobility status;Other (comment) (hypertension during session) PT Visit Diagnosis: Muscle weakness (generalized) (M62.81);Difficulty in walking, not elsewhere classified (R26.2);Unsteadiness on feet (R26.81)    Time: 1000-1028 PT Time Calculation (min) (ACUTE ONLY): 28 min   Charges:   PT Evaluation $PT Eval Moderate Complexity: 1 Mod PT Treatments $Therapeutic Activity: 8-22 mins       Windell Norfolk, DPT, PN2   Supplemental Physical Therapist [place]    Pager [phone] Acute Rehab Office [phone]

## 2020-11-21 NOTE — Progress Notes (Addendum)
Neurology Progress Note  Brief HPI: 75 y.o. female with PMHx of morbid obesity, endometrial carcinoma s/p hysterectomy and local radiation with brachytherapy, DM2, anxiety, CKD III, GERD, glaucoma, parafalcine subdural hemorrhage (05/2020, managed conservatively), HLD, HTN, and OSA not on CPAP who presented to the ED 8/20 via EMS for evaluation of unresponsiveness at home. Husband noted that she appeared confused and withdrawn that morning and trouble with word-finding before swaying her arms over her head and then her arms became rigid. EMS was activated and on arrival visualized her to be unresponsive with a right gaze, fixed pupils, and noted respiratory distress with concern for seizure activity and was given 10 mg IM Versed. While in CT, she became restless and was given 1g of Keppra for concern for seizure.   Regarding her malignancy history, per last oncology note on 07/25/2020, she has a stage Ib grade 1 endometrioid endometrial adenocarcinoma, and she had robotic assisted total hysterectomy BSO (09/30/2017, staging not performed due to morbid obesity) completed vaginal brachytherapy in September 2019.  She is having surveillance visits currently, scheduled to stop September 2024.   On chart review, her weight has been essentially stable since April 20, 2013, based on weights documented in office notes.  11/21/2020: NP spoke with husband via telephone to obtain further history:   Patient with 2 recent falls, one 05/10/2020 when the chair she was sitting in at work broke and she struck her head on a nearby table as she fell but did not have LOC. She was seen in the ED and was noted to have a small SDH and recommended to follow up outpatient with neurosurgery. About 1 month ago, she suffered another fall in the parking lot at work. Patient's husband states that she was walking in the dark with her walker when one of the walker legs got stuck in a hole or crack in the pavement and threw her to the ground.  He states that she was on a hill and rolled once she fell. She did not get evaluated at the hospital at that time but went to her PCP at [place] and was diagnosed with a rib fracture. He states that since the most recent fall, she has complained of hip and leg pain and states she was taking her prescribed medication but felt that it did make her drowsy. He states she would take the medication before bed. He also states that for the past one month, when Ms. Bechtold wakes in the mornings she has slurred speech and "sounds drunk" until "she is good and awake" but he states that her speech does clear each day.   Ms. Jarvie has been working at [employer] in [employer] at [employer]. She loved to work and was able to drive herself to work daily. 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 He states that she was recently out of work for the summer break but that she started back in the past few weeks. He states that recently, her boss threatened to write patient up because she is not the same as she used to be and he ultimately gave her the option to retire or be fired. This occured 1-2 days before she retired on Friday but husband states that she did not want to retire and was upset about it. Husband is unsure what her boss meant about not being the same as she used to  be. She retired on 11/17/2020 and went to dinner with her husband that night with normal mental status without confusion. The next morning, he states that she seemed somewhat confused as she was attempting to comment on a picture on FaceBook from her retirement party at work and could not figure out how to comment which is unusual for her. He states that a co-worker called and she seemed angry and did not wish to speak with her (he thinks she was upset because the co-worker is still working and Ms. Leitz is not). He states "it all changed after that lady  called" and "it hit her all at once". That afternoon, he witnessed her moving her arms in slow motion and staring off. Her husband was asking her what was wrong but she was not speaking to him. He states that it seemed as if she was trying to speak but could not get the words out prompting him to activate EMS.  At baseline, Ms. Mcquade walks with a walker, manages her medications and her husband's medications. She drives herself and her husband to appointments. She is able to complete all of her ADLs without assistance and does not typically have any confusion.   Subjective: No acute overnight events Patient remains confused   Exam: Vitals:   11/20/20 2351 11/21/20 0345  BP:  (!) 161/80  Pulse:  83  Resp:    Temp: 98.3 F (36.8 C) 98.3 F (36.8 C)  SpO2:  97%   Gen: Laying in hospital bed. Initially she had managed to remove one of her mitts and was attempting to pull her IV out on NP assessment. Mitt replaced. No acute distress noted.  Resp: non-labored breathing, no respiratory distress Abd: soft, non-tender, non-distended  Neuro: Mental Status: Awake, alert, oriented to self. She incorrectly states that she is 75 years old, the year is 48, and when asked where we are currently located she states "[place], [place]. At [employer] [facility] [place]". She initially states that the month is September but quickly corrects herself and states that it is August. She states that she is in the hospital because she "fell a couple of times" and then asks "why am I here?". Naming is intact. Poor attention noted.  She is unable to provide a clear or coherent history of present illness.  She is confabulating and perseverating on food and retirement presently. Speech is mildly dysarthric.  No neglect is noted.  Cranial Nerves: PERRL, EOMI without ptosis, visual fields are full, face appears symmetric resting and with movement, hearing is intact to voice, phonation is normal, palate elevates  symmetrically, shoulders shrug symmetrically, tongue protrudes midline.  Motor: Moves all extremities spontaneously and with antigravity movement without asymmetry.  Sensory: Unable to assess due to patient with unreliable reporting; patient possibly reports decreased sensation to the left upper and lower extremities but then perseverates on left and right. She states that the right arm feels right then states "left arm, right arm" while raising her left/right arm and repeats this process with the lower extremities.  Coordination: Unable to assess due to poor attention and trouble following multi-step commands.  Gait: Deferred  Pertinent Labs: CBC    Component Value Date/Time   WBC 11.1 (H) 11/20/2020 0449   RBC 3.98 11/20/2020 0449   HGB 12.1 11/20/2020 0449   HCT 36.8 11/20/2020 0449   PLT 130 (L) 11/20/2020 0449   MCV 92.5 11/20/2020 0449   MCH 30.4 11/20/2020 0449   MCHC 32.9 11/20/2020 0449  RDW 14.2 11/20/2020 0449   LYMPHSABS 2.1 11/18/2020 1656   MONOABS 0.6 11/18/2020 1656   EOSABS 0.1 11/18/2020 1656   BASOSABS 0.1 11/18/2020 1656   CMP     Component Value Date/Time   NA 141 11/20/2020 0449   K 3.3 (L) 11/20/2020 0449   CL 112 (H) 11/20/2020 0449   CO2 21 (L) 11/20/2020 0449   GLUCOSE 135 (H) 11/20/2020 0449   BUN 21 11/20/2020 0449   CREATININE 1.26 (H) 11/20/2020 0449   CALCIUM 7.9 (L) 11/20/2020 0449   PROT 4.8 (L) 11/20/2020 0449   ALBUMIN 2.4 (L) 11/20/2020 0449   AST 29 11/20/2020 0449   ALT 14 11/20/2020 0449   ALKPHOS 69 11/20/2020 0449   BILITOT 1.2 11/20/2020 0449   GFRNONAA 45 (L) 11/20/2020 0449   GFRAA 48 (L) 10/01/2017 0527   Urinalysis    Component Value Date/Time   COLORURINE YELLOW 11/18/2020 2121   APPEARANCEUR HAZY (A) 11/18/2020 2121   LABSPEC 1.012 11/18/2020 2121   PHURINE 7.0 11/18/2020 2121   GLUCOSEU >=500 (A) 11/18/2020 2121   HGBUR SMALL (A) 11/18/2020 2121   BILIRUBINUR NEGATIVE 11/18/2020 2121   KETONESUR NEGATIVE  11/18/2020 2121   PROTEINUR 100 (A) 11/18/2020 2121   NITRITE NEGATIVE 11/18/2020 2121   LEUKOCYTESUR NEGATIVE 11/18/2020 2121   Drugs of Abuse     Component Value Date/Time   LABOPIA NONE DETECTED 11/18/2020 2121   COCAINSCRNUR NONE DETECTED 11/18/2020 2121   LABBENZ POSITIVE (A) 11/18/2020 2121   AMPHETMU NONE DETECTED 11/18/2020 2121   THCU NONE DETECTED 11/18/2020 2121   LABBARB NONE DETECTED 11/18/2020 2121    Lab Results  Component Value Date   VITAMINB12 190 11/20/2020   Lab Results  Component Value Date   TSH 1.288 11/20/2020  Ammonia- slightly elevated at 48 HIV- Non Reactive  No results found for: FOLATE Vitamin B1- pending  Methylmalonic acid- pending HSV PCR pending Blood cultures pending RPR pending  CSF 11/20/2020. Opening pressure 13 cmH2O  Ref. Range 11/20/2020 13:28 11/20/2020 13:32  Appearance, CSF Latest Ref Range: CLEAR  CLEAR CLEAR  RBC Count, CSF Latest Ref Range: 0 /cu mm 106 (H) 4 (H)  WBC, CSF Latest Ref Range: 0 - 5 /cu mm 1 1  Other Cells, CSF Unknown FEW NEUTROPHILS,FEW LYMPHOCYTES AND FEW MONOCYTES NOTED RARE NEUTROPHILS,FEW LYMPHOCYTES AND FEW MONOCYTES NOTED  Color, CSF Latest Ref Range: COLORLESS  COLORLESS COLORLESS  Supernatant Unknown NOT INDICATED NOT INDICATED  Tube # Unknown 1 4    Results for orders placed or performed during the hospital encounter of 11/18/20  Resp Panel by RT-PCR (Flu A&B, Covid) Nasopharyngeal Swab     Status: None   Collection Time: 11/18/20  4:59 PM   Specimen: Nasopharyngeal Swab; Nasopharyngeal(NP) swabs in vial transport medium  Result Value Ref Range Status   SARS Coronavirus 2 by RT PCR NEGATIVE NEGATIVE Final   Influenza A by PCR NEGATIVE NEGATIVE Final   Influenza B by PCR NEGATIVE NEGATIVE Final    Comment: (NOTE) The Xpert Xpress SARS-CoV-2/FLU/RSV plus assay is intended as an aid in the diagnosis of influenza from Nasopharyngeal swab specimens and should not be used as a sole basis for treatment.  Nasal washings and aspirates are unacceptable for Xpert Xpress SARS-CoV-2/FLU/RSV testing.  Fact Sheet for Patients: EntrepreneurPulse.com.au  Fact Sheet for Healthcare Providers: IncredibleEmployment.be  This test is not yet approved or cleared by the [place] FDA and has been authorized for detection and/or diagnosis of SARS-CoV-2 by FDA  under an Emergency Use Authorization (EUA). This EUA will remain in effect (meaning this test can be used) for the duration of the COVID-19 declaration under Section 564(b)(1) of the Act, 21 U.S.C. section 360bbb-3(b)(1), unless the authorization is terminated or revoked.  Performed at [facility], [place] [address] [place], [place] [address]   Urine Culture     Status: Abnormal   Collection Time: 11/19/20  9:52 AM   Specimen: Urine, Clean Catch  Result Value Ref Range Status   Specimen Description URINE, CLEAN CATCH  Final   Special Requests   Final    NONE Performed at [facility], [place] [address] [place], [place] [address]    Culture MULTIPLE SPECIES PRESENT, SUGGEST RECOLLECTION (A)  Final   Report Status 11/20/2020 FINAL  Final  MRSA Next Gen by PCR, Nasal     Status: None   Collection Time: 11/19/20  4:23 PM   Specimen: Nasal Mucosa; Nasal Swab  Result Value Ref Range Status   MRSA by PCR Next Gen NOT DETECTED NOT DETECTED Final    Comment: (NOTE) The GeneXpert MRSA Assay (FDA approved for NASAL specimens only), is one component of a comprehensive MRSA colonization surveillance program. It is not intended to diagnose MRSA infection nor to guide or monitor treatment for MRSA infections. Test performance is not FDA approved in patients less than 36 years old. Performed at [facility], [place] [address] [place], [place] [address]    Imaging Reviewed: EEG 11/20/2020: "This study is within normal limits. No seizures or epileptiform discharges were seen throughout the  recording."   MRI brain [place] contrast 11/19/2020, personally reviewed by attending MD, agree with radiology: 1. No acute intracranial abnormality. 2. White matter changes that may be due to chronic small vessel disease or a sequela of cancer treatment.   CT Head 11/18/2020: There is no acute intracranial hemorrhage or evidence of acute infarction within limitation of significant motion artifact on many slices. ASPECT score is 10.  Assessment: 75 yo F with new onset progressive confusion and seizures. Etiology is unclear.  - Examination reveals patient with persistent confusion and confabulation.  - Work up so far reveals EEG without evidence of seizures or epileptiform discharges, MRI brain and CTH without acute intracranial abnormality, CSF studies reassuring against acute infection, vitamin B12 level of 190 below neurologic standards, and a slightly elevated ammonia at 48. HSV PCR remains pending, on prophylactic acyclovir until HSV PCR result. - Ddx includes seizure- initial presentation of gaze deviation and unresponsiveness following abnormal arm movement, I have a very low suspicion for stroke given presentation and negative MRI imaging, bacterial meningitis is unlikely with unremarkable CSF studies, infectious etiology is unlikely but cannot be ruled out with evidence of a mild leukocytosis with a WBC count of 11.1 and a temperature maximum of 100.9 degrees since admission. Agree with continuing acyclovir until HSV PCR results. Urine culture was obtained with multiple species, concern for contamination, recommend recollection. UA with many bacteria but without evidence of leukocytes or nitrites. Encephalopathy may also be related to stress due to recent forced retirement the night before onset of confusion, delirium, polypharmacy,  or most likely multifactorial with a combination of any of those listed above.   Impression:  Acute encephalopathy with confusion; persistent; etiology unclear at this  time  Recommendations:  - Discontinue Keppra  - Obtain 24h EEG to rule out further seizure activity due to persistent encephalopathy - Defer further AEDs unless patient with  a witnessed seizure or seizure visualized on EEG monitor - Vitamin B1, folate, MMA, RPR, blood cultures, CSF HSV PCR pending - Recommend Vitamin B12 supplementation due to serum B12 level of 190. Goal of > 500 for neurologic standards --MMA to confirm functional depletion of B12 is pending - Thiamine supplementation initiated with 500 mg IV once followed by 100 mg IV daily, may be discontinued if level results normal (typically takes 2 to 3 days) - Continue Acyclovir at this time, duration decision pending CSF results, specifically HSV 1/2 PCR - Consider psychiatric evaluation for management of acute stress response  - Continue inpatient seizure precautions - Neurology will continue to follow  Anibal Henderson, AGACNP-BC [facility] [phone]  Attending Neurologist's note:  On my evaluation at approximately 8:30 PM just after EEG hookup, the patient was markedly more clear than on prior evaluations.  She corroborated much of the history obtained by my NP from the patient's husband earlier today, including that she had had 2 falls, that she worked at [employer] but was having some difficulty keeping up with "new systems" including cash register changes and changes in documentation for time off etc. She mentioned loving her job and loving seeing the students, but that she had difficulty with [employer].  She notes that she retired on Friday, was able to tell me that it is 2022 although she stated the month is September, she was able to tell me that it is still August with prompting and that her birthday will be in September and she will be 75 years old. Cranial nerves remain grossly intact and she is moving all 4 extremities symmetrically, mitts in place to reduce risk of disrupting EEG wires.  The major intervention  today has been discontinuing her Keppra, and she has also been continued on acyclovir for potential HSV.  I am very reassured by the improvement in her examination.  Unclear at this time whether she is simply having a natural recovery, whether Keppra was contributing to delirium, or whether she has an encephalitis that is clearing or responding to acyclovir.  Vitamin supplementation should also be continued though I would be surprised if it were making that dramatic improvement given her history; it is a low risk intervention.  I personally saw this patient, gathering history, performing a full neurologic examination, reviewing relevant labs, personally reviewing relevant imaging , and formulated the assessment and plan, adding the note above for completeness and clarity to accurately reflect my thoughts  Lesleigh Noe MD-PhD [facility] [phone]  Available 7 AM to 7 PM, outside these hours please contact Neurologist on call listed on AMION   Greater than 25 minutes were spent in the care of this patient today, of which over 50% was at bedside

## 2020-11-21 NOTE — Progress Notes (Signed)
PROGRESS NOTE    Elizabeth Crosby  QQI:[id number] DOB: May 31, 1945 DOA: 11/18/2020 PCP: [name], MD   Brief Narrative: Elizabeth Crosby is a 75 y.o.  female with medical history significant of endometrial CA s/p radiation, DM2, HTN, OSA. Patient presented secondary to altered mental status and seizure witness during EMS transportation. SHe was found to have a LLL pneumonia and was started on antibiotics. Neurology consulted with recommendation for LP studies and EEG.   Assessment & Plan:   Principal Problem:   Acute encephalopathy Active Problems:   Seizure ([place])   DM2 (diabetes mellitus, type 2) ([facility])   HTN (hypertension)   Left lower lobe pneumonia   Acute encephalopathy Seizures Unclear etiology. MRI without acute process. Neurology consulted on admission. Empirically started on Acyclovir. LP from 8/22 not consistent with infection. -Neurology recommendations: acyclovir IV; discontinue Keppra, LTM EEG tonight. -Follow-up CSF HSV PCR  Left lower lobe pneumonia Started on Ceftriaxone IV and azithromycin IV. Blood cultures ordered on admission but do not appear to have been collected. -Continue Ceftriaxone/azithromycin -Blood cultures (8/22) pending; obtained after antibiotics  Diabetes mellitus, type 2 Patient is on Tresiba 40 units daily and Humalog 10 units daily with supper as an outpatient. Started on SSI while inpatient -Continue SSI for now -Continue Semglee 20 units daily  Primary hypertension Patient is on telmesartan, hydrochlorothiazide as an outpatient. Blood pressure uncontrolled -Check BMP; if stable, will resume home ARB and possibly home hydrochlorothiazide   DVT prophylaxis: Lovenox Code Status:   Code Status: Full Code Family Communication: None at bedside. Called husband. Asked patient several times if I can call her daughter, and she requested that I call her husband Disposition Plan: Discharge home vs SNF likely in several days pending neuro  workup/management   Consultants:  Neurology  Procedures:  EEG (11/20/2020) IMPRESSION: This study is within normal limits. No seizures or epileptiform discharges were seen throughout the recording.  LUMBAR PUNCTURE (11/20/2020)  Antimicrobials: Ceftriaxone Azithromycin Acyclovir    Subjective: No concerns today.  Objective: Vitals:   11/20/20 1956 11/20/20 2351 11/21/20 0345 11/21/20 0800  BP: (!) 181/68  (!) 161/80 (!) 195/65  Pulse: 79  83 85  Resp: 19   (!) 24  Temp: 98.4 F (36.9 C) 98.3 F (36.8 C) 98.3 F (36.8 C)   TempSrc: Oral Oral Oral   SpO2: 96%  97% 96%  Weight:      Height:        Intake/Output Summary (Last 24 hours) at 11/21/2020 1046 Last data filed at 11/21/2020 0506 Gross per 24 hour  Intake 888.48 ml  Output 1300 ml  Net -411.52 ml    Filed Weights   11/18/20 2330  Weight: 104.3 kg    Examination:  General exam: Appears calm and comfortable Respiratory system: Clear to auscultation. Respiratory effort normal. Cardiovascular system: S1 & S2 heard, RRR. No murmurs, rubs, gallops or clicks. Gastrointestinal system: Abdomen is nondistended, soft and nontender. No organomegaly or masses felt. Normal bowel sounds heard. Central nervous system: Alert and oriented to person/place and time. No focal neurological deficits. Musculoskeletal: No calf tenderness Skin: No cyanosis.  Psychiatry: Judgement and insight appear normal. Tangential.    Data Reviewed: I have personally reviewed following labs and imaging studies  CBC Lab Results  Component Value Date   WBC 11.1 (H) 11/20/2020   RBC 3.98 11/20/2020   HGB 12.1 11/20/2020   HCT 36.8 11/20/2020   MCV 92.5 11/20/2020   MCH 30.4 11/20/2020   PLT  130 (L) 11/20/2020   MCHC 32.9 11/20/2020   RDW 14.2 11/20/2020   LYMPHSABS 2.1 11/18/2020   MONOABS 0.6 11/18/2020   EOSABS 0.1 11/18/2020   BASOSABS 0.1 95/62/1308     Last metabolic panel Lab Results  Component Value Date   NA 141  11/20/2020   K 3.3 (L) 11/20/2020   CL 112 (H) 11/20/2020   CO2 21 (L) 11/20/2020   BUN 21 11/20/2020   CREATININE 1.26 (H) 11/20/2020   GLUCOSE 135 (H) 11/20/2020   GFRNONAA 45 (L) 11/20/2020   GFRAA 48 (L) 10/01/2017   CALCIUM 7.9 (L) 11/20/2020   PROT 4.8 (L) 11/20/2020   ALBUMIN 2.4 (L) 11/20/2020   BILITOT 1.2 11/20/2020   ALKPHOS 69 11/20/2020   AST 29 11/20/2020   ALT 14 11/20/2020   ANIONGAP 8 11/20/2020    CBG (last 3)  Recent Labs    11/20/20 2348 11/21/20 0353 11/21/20 0739  GLUCAP 98 123* 126*      GFR: Estimated Creatinine Clearance: 43.5 mL/min (A) (by C-G formula based on SCr of 1.26 mg/dL (H)).  Coagulation Profile: Recent Labs  Lab 11/18/20 1656  INR 1.1     Recent Results (from the past 240 hour(s))  Resp Panel by RT-PCR (Flu A&B, Covid) Nasopharyngeal Swab     Status: None   Collection Time: 11/18/20  4:59 PM   Specimen: Nasopharyngeal Swab; Nasopharyngeal(NP) swabs in vial transport medium  Result Value Ref Range Status   SARS Coronavirus 2 by RT PCR NEGATIVE NEGATIVE Final   Influenza A by PCR NEGATIVE NEGATIVE Final   Influenza B by PCR NEGATIVE NEGATIVE Final    Comment: (NOTE) The Xpert Xpress SARS-CoV-2/FLU/RSV plus assay is intended as an aid in the diagnosis of influenza from Nasopharyngeal swab specimens and should not be used as a sole basis for treatment. Nasal washings and aspirates are unacceptable for Xpert Xpress SARS-CoV-2/FLU/RSV testing.  Fact Sheet for Patients: EntrepreneurPulse.com.au  Fact Sheet for Healthcare Providers: IncredibleEmployment.be  This test is not yet approved or cleared by the [place] FDA and has been authorized for detection and/or diagnosis of SARS-CoV-2 by FDA under an Emergency Use Authorization (EUA). This EUA will remain in effect (meaning this test can be used) for the duration of the COVID-19 declaration under Section 564(b)(1) of the Act, 21  U.S.C. section 360bbb-3(b)(1), unless the authorization is terminated or revoked.  Performed at [facility], [place] [address], [place], [place] [address]   Urine Culture     Status: Abnormal   Collection Time: 11/19/20  9:52 AM   Specimen: Urine, Clean Catch  Result Value Ref Range Status   Specimen Description URINE, CLEAN CATCH  Final   Special Requests   Final    NONE Performed at [facility], [place] [address] [place], [place] [address]    Culture MULTIPLE SPECIES PRESENT, SUGGEST RECOLLECTION (A)  Final   Report Status 11/20/2020 FINAL  Final  MRSA Next Gen by PCR, Nasal     Status: None   Collection Time: 11/19/20  4:23 PM   Specimen: Nasal Mucosa; Nasal Swab  Result Value Ref Range Status   MRSA by PCR Next Gen NOT DETECTED NOT DETECTED Final    Comment: (NOTE) The GeneXpert MRSA Assay (FDA approved for NASAL specimens only), is one component of a comprehensive MRSA colonization surveillance program. It is not intended to diagnose MRSA infection nor to guide or monitor treatment for MRSA infections. Test performance is not FDA approved  in patients less than 26 years old. Performed at [facility], [place] [address], [place], [place] [address]   Culture, blood (routine x 2)     Status: None (Preliminary result)   Collection Time: 12/15/2020  2:48 PM   Specimen: BLOOD RIGHT HAND  Result Value Ref Range Status   Specimen Description BLOOD RIGHT HAND  Final   Special Requests   Final    BOTTLES DRAWN AEROBIC AND ANAEROBIC Blood Culture results may not be optimal due to an inadequate volume of blood received in culture bottles   Culture   Final    NO GROWTH < 24 HOURS Performed at [facility], [place] [address] [place], [place] [address]    Report Status PENDING  Incomplete  Culture, blood (routine x 2)     Status: None (Preliminary result)   Collection Time: 12/15/2020 10:09 PM   Specimen: BLOOD  Result Value Ref Range Status   Specimen Description  BLOOD LEFT ARM  Final   Special Requests   Final    BOTTLES DRAWN AEROBIC AND ANAEROBIC Blood Culture adequate volume   Culture   Final    NO GROWTH < 12 HOURS Performed at [facility], [place] [address] [place], [place] [address]    Report Status PENDING  Incomplete         Radiology Studies: EEG adult  Result Date: 15-Dec-2020 Lora Havens, MD     2020-12-15  3:56 PM Patient Name: LEANNY MOECKEL MRN: [id number] Epilepsy Attending: Lora Havens Referring Physician/Provider: Dr Roland Rack Date: 2020-12-15 Duration: 23.56 mins Patient history: 75yo F with new onset seizure. EEG to evaluate for seizure. Level of alertness: Awake, asleep AEDs during EEG study: LEV Technical aspects: This EEG study was done with scalp electrodes positioned according to the 10-20 International system of electrode placement. Electrical activity was acquired at a sampling rate of 500Hz  and reviewed with a high frequency filter of 70Hz  and a low frequency filter of 1Hz . EEG data were recorded continuously and digitally stored. Description: The posterior dominant rhythm consists of 9 Hz activity of moderate voltage (25-35 uV) seen predominantly in posterior head regions, symmetric and reactive to eye opening and eye closing. Sleep was characterized by vertex waves, sleep spindles (12 to 14 Hz), maximal frontocentral region.  Hyperventilation and photic stimulation were not performed.   IMPRESSION: This study is within normal limits. No seizures or epileptiform discharges were seen throughout the recording. [place]   DG FL GUIDED LUMBAR PUNCTURE  Result Date: 2020-12-15 CLINICAL DATA:  Patient admitted for altered mental status and seizures. Radiology asked to perform a diagnostic lumbar puncture. EXAM: DIAGNOSTIC LUMBAR PUNCTURE UNDER FLUOROSCOPIC GUIDANCE COMPARISON:  None FLUOROSCOPY TIME:  Fluoroscopy Time:  30 seconds Radiation Exposure Index (if provided by the fluoroscopic device): 15.6  mGy Number of Acquired Spot Images: 2 PROCEDURE: Informed consent was obtained from the patient's husband prior to the procedure, including potential complications of headache, allergy, and pain. With the patient prone, the lower back was prepped with Betadine. 1% Lidocaine was used for local anesthesia. Lumbar puncture was performed at the L3-L4 level using a 5 inch 20 gauge gauge needle with return of clear CSF with an opening pressure of 13 cm water. 12 ml of CSF were obtained for laboratory studies. The patient tolerated the procedure well and there were no apparent complications. IMPRESSION: Technically successful L3-L4 image-guided lumbar puncture yielding 12 mL of CSF for lab studies. Read  by: Soyla Dryer, NP Electronically Signed   By: Lorin Picket M.D.   On: 11/20/2020 13:26        Scheduled Meds:  enoxaparin (LOVENOX) injection  40 mg Subcutaneous Q24H   insulin aspart  0-9 Units Subcutaneous Q4H   insulin glargine-yfgn  20 Units Subcutaneous Daily   thiamine injection  100 mg Intravenous Q24H   Continuous Infusions:  acyclovir 705 mg (11/20/20 2215)   azithromycin 500 mg (11/20/20 2208)   cefTRIAXone (ROCEPHIN)  IV 2 g (11/20/20 2334)   dextrose 5 % and 0.9% NaCl 125 mL/hr at 11/21/20 0505   levETIRAcetam 750 mg (11/20/20 2335)     LOS: 2 days     Cordelia Poche, MD [facility] 11/21/2020, 10:46 AM  If 7PM-7AM, please contact night-coverage [email or web address]

## 2020-11-21 NOTE — Progress Notes (Signed)
LTM EEG hooked up and running - no initial skin breakdown - push button tested - neuro notified. [facility] monitoring.  

## 2020-11-22 ENCOUNTER — Inpatient Hospital Stay (HOSPITAL_COMMUNITY): Payer: Medicare Other

## 2020-11-22 DIAGNOSIS — E119 Type 2 diabetes mellitus without complications: Secondary | ICD-10-CM | POA: Diagnosis not present

## 2020-11-22 DIAGNOSIS — R569 Unspecified convulsions: Secondary | ICD-10-CM | POA: Diagnosis not present

## 2020-11-22 DIAGNOSIS — J189 Pneumonia, unspecified organism: Secondary | ICD-10-CM | POA: Diagnosis not present

## 2020-11-22 DIAGNOSIS — I10 Essential (primary) hypertension: Secondary | ICD-10-CM | POA: Diagnosis not present

## 2020-11-22 DIAGNOSIS — G9340 Encephalopathy, unspecified: Secondary | ICD-10-CM | POA: Diagnosis not present

## 2020-11-22 LAB — GLUCOSE, CAPILLARY
Glucose-Capillary: 102 mg/dL — ABNORMAL HIGH (ref 70–99)
Glucose-Capillary: 104 mg/dL — ABNORMAL HIGH (ref 70–99)
Glucose-Capillary: 105 mg/dL — ABNORMAL HIGH (ref 70–99)
Glucose-Capillary: 110 mg/dL — ABNORMAL HIGH (ref 70–99)
Glucose-Capillary: 117 mg/dL — ABNORMAL HIGH (ref 70–99)
Glucose-Capillary: 128 mg/dL — ABNORMAL HIGH (ref 70–99)
Glucose-Capillary: 148 mg/dL — ABNORMAL HIGH (ref 70–99)
Glucose-Capillary: 163 mg/dL — ABNORMAL HIGH (ref 70–99)

## 2020-11-22 LAB — CBC
HCT: 32.2 % — ABNORMAL LOW (ref 36.0–46.0)
Hemoglobin: 10.6 g/dL — ABNORMAL LOW (ref 12.0–15.0)
MCH: 30 pg (ref 26.0–34.0)
MCHC: 32.9 g/dL (ref 30.0–36.0)
MCV: 91.2 fL (ref 80.0–100.0)
Platelets: 137 10*3/uL — ABNORMAL LOW (ref 150–400)
RBC: 3.53 MIL/uL — ABNORMAL LOW (ref 3.87–5.11)
RDW: 14.4 % (ref 11.5–15.5)
WBC: 9 10*3/uL (ref 4.0–10.5)
nRBC: 0 % (ref 0.0–0.2)

## 2020-11-22 LAB — BASIC METABOLIC PANEL
Anion gap: 6 (ref 5–15)
BUN: 18 mg/dL (ref 8–23)
CO2: 23 mmol/L (ref 22–32)
Calcium: 7.6 mg/dL — ABNORMAL LOW (ref 8.9–10.3)
Chloride: 113 mmol/L — ABNORMAL HIGH (ref 98–111)
Creatinine, Ser: 1.14 mg/dL — ABNORMAL HIGH (ref 0.44–1.00)
GFR, Estimated: 51 mL/min — ABNORMAL LOW (ref >60)
Glucose, Bld: 124 mg/dL — ABNORMAL HIGH (ref 70–99)
Potassium: 3.4 mmol/L — ABNORMAL LOW (ref 3.5–5.1)
Sodium: 142 mmol/L (ref 135–145)

## 2020-11-22 LAB — RPR: RPR Ser Ql: NONREACTIVE

## 2020-11-22 LAB — HSV 1/2 PCR, CSF
HSV-1 DNA: NEGATIVE
HSV-2 DNA: NEGATIVE

## 2020-11-22 LAB — METHYLMALONIC ACID, SERUM: Methylmalonic Acid, Quantitative: 335 nmol/L (ref 0–378)

## 2020-11-22 MED ORDER — IOHEXOL 350 MG/ML SOLN
75.0000 mL | Freq: Once | INTRAVENOUS | Status: AC | PRN
  Administered 2020-11-22: 75 mL via INTRAVENOUS

## 2020-11-22 MED ORDER — VITAMIN B-12 1000 MCG PO TABS
500.0000 ug | ORAL_TABLET | Freq: Every day | ORAL | Status: DC
  Administered 2020-11-23 – 2020-11-25 (×3): 500 ug via ORAL
  Filled 2020-11-22 (×3): qty 1

## 2020-11-22 MED ORDER — POTASSIUM CHLORIDE CRYS ER 20 MEQ PO TBCR
40.0000 meq | EXTENDED_RELEASE_TABLET | Freq: Once | ORAL | Status: AC
  Administered 2020-11-22: 40 meq via ORAL
  Filled 2020-11-22: qty 2

## 2020-11-22 NOTE — Progress Notes (Addendum)
PROGRESS NOTE  Elizabeth Crosby YQI:[id number] DOB: 08/13/1945 DOA: 11/18/2020 PCP: [name], MD   LOS: 3 days   Brief narrative:  Elizabeth Crosby is a 75 y.o.  female with medical history significant of endometrial CA s/p radiation, diabetes mellitus type 2, hypertension, obstructive sleep apnea presented to the hospital with secondary to altered mental status and seizure witnessed during EMS transportation.  Patient was found to have a LLL pneumonia and was started on antibiotics. Neurology was consulted consulted with recommendation for LP studies and EEG.  Assessment/Plan:  Principal Problem:   Acute encephalopathy Active Problems:   Seizure ([place])   DM2 (diabetes mellitus, type 2) (HCC)   HTN (hypertension)   Left lower lobe pneumonia   Acute encephalopathy  Seizures Unclear etiology.  MRI without any acute process.  Neurology was consulted and patient underwent lumbar puncture on 822 not consistent with infection.  However patient is empirically on acyclovir.  Keppra has been discontinued as per neurology..  Patient underwent long-term EEG monitoring as well. Follow-up CSF HSV PCR.  Continue vitamin B12 and thiamine supplementation.   Left lower lobe pneumonia Continue Ceftriaxone IV and azithromycin IV.  Follow blood cultures.     Diabetes mellitus, type 2 Continue sliding scale insulin and long-acting insulin.  Currently on Semglee 20 units.  Patient is on Tresiba 40 units at home.  Patient is also on D5 normal saline.  Essential hypertension Patient is on telmesartan, hydrochlorothiazide at home.  Add as needed antihypertensives.    Hypokalemia.  She is allergic to potassium supplements.  We will communicate with the patient.  Mild hypokalemia noted.  Debility, deconditioning.  PT recommended skilled nursing facility placement.  DVT prophylaxis: enoxaparin (LOVENOX) injection 40 mg Start: 11/19/20 1000   Code Status: Full code  Family Communication: None  today  Status is: Inpatient  Remains inpatient appropriate because:IV treatments appropriate due to intensity of illness or inability to take PO, Inpatient level of care appropriate due to severity of illness, and need for rehabilitation  Dispo: The patient is from: Home              Anticipated d/c is to: SNF              Patient currently is not medically stable to d/c.   Difficult to place patient No  Consultants: Neurology  Procedures: EEG  Anti-infectives:  Acyclovir  Anti-infectives (From admission, onward)    Start     Dose/Rate Route Frequency Ordered Stop   11/19/20 0800  acyclovir (ZOVIRAX) 705 mg in dextrose 5 % 150 mL IVPB        10 mg/kg  70.4 kg (Adjusted) 164.1 mL/hr over 60 Minutes Intravenous Every 12 hours 11/18/20 2343     11/19/20 0015  nirmatrelvir/ritonavir EUA (renal dosing) (PAXLOVID) 2 tablet  Status:  Discontinued        2 tablet Oral 2 times daily 11/19/20 0005 11/19/20 1114   11/19/20 0000  nirmatrelvir/ritonavir EUA (PAXLOVID) 2 tablet  Status:  Discontinued        2 tablet Oral 2 times daily 11/18/20 2352 11/19/20 0005   11/18/20 2315  cefTRIAXone (ROCEPHIN) 2 g in sodium chloride 0.9 % 100 mL IVPB        2 g 200 mL/hr over 30 Minutes Intravenous Daily at bedtime 11/18/20 2302 11/23/20 2159   11/18/20 2315  azithromycin (ZITHROMAX) 500 mg in sodium chloride 0.9 % 250 mL IVPB        500 mg 250  mL/hr over 60 Minutes Intravenous Daily at bedtime 11/18/20 2302 11/23/20 2159   11/18/20 2300  acyclovir (ZOVIRAX) 500 mg in dextrose 5 % 100 mL IVPB        500 mg 110 mL/hr over 60 Minutes Intravenous  Once 11/18/20 2256 11/19/20 0146      Subjective:  Today, patient was seen and examined at bedside.  Patient denies any nausea, vomiting, headache but appears to be slightly confused.  Patient has been telling stories of events in the past.  Blood pressure is elevated  Objective: Vitals:   11/22/20 0735 11/22/20 1146  BP: (!) 168/118 (!) 152/30   Pulse: 80 88  Resp: 20 20  Temp: 98.4 F (36.9 C) 98.6 F (37 C)  SpO2: 99% 94%    Intake/Output Summary (Last 24 hours) at 11/22/2020 1201 Last data filed at 11/21/2020 2037 Gross per 24 hour  Intake --  Output 400 ml  Net -400 ml   Filed Weights   11/18/20 2330  Weight: 104.3 kg   Body mass index is 43.46 kg/m.   Physical Exam:  GENERAL: Patient is alert awake and oriented place and time occasionally confused,. Not in obvious distress.  Morbidly obese HENT: No scleral pallor or icterus. Pupils equally reactive to light. Oral mucosa is moist NECK: is supple, no gross swelling noted. CHEST: Clear to auscultation. No crackles or wheezes.  Diminished breath sounds bilaterally. CVS: S1 and S2 heard, no murmur. Regular rate and rhythm.  ABDOMEN: Soft, non-tender, bowel sounds are present. EXTREMITIES: No edema.  Bilateral upper extremity mittens. CNS: Cranial nerves are intact. No focal motor deficits. SKIN: warm and dry without rashes.  Data Review: I have personally reviewed the following laboratory data and studies,  CBC: Recent Labs  Lab 11/18/20 1656 11/18/20 1709 11/19/20 0500 11/20/20 0449 11/22/20 0308  WBC 6.1  --  13.5* 11.1* 9.0  NEUTROABS 3.2  --   --   --   --   HGB 12.0 11.6* 11.4* 12.1 10.6*  HCT 36.9 34.0* 35.9* 36.8 32.2*  MCV 94.1  --  96.5 92.5 91.2  PLT 135*  --  116* 130* 263*   Basic Metabolic Panel: Recent Labs  Lab 11/18/20 1656 11/18/20 1709 11/19/20 0500 11/20/20 0449 11/21/20 1406 11/22/20 0308  NA 143 146* 143 141 141 142  K 3.7 3.6 3.5 3.3* 3.3* 3.4*  CL 112* 110 112* 112* 114* 113*  CO2 22  --  23 21* 22 23  GLUCOSE 341* 338* 232* 135* 198* 124*  BUN 20 21 20 21 16 18   CREATININE 1.27* 1.20* 1.19* 1.26* 1.25* 1.14*  CALCIUM 8.3*  --  8.0* 7.9* 7.6* 7.6*   Liver Function Tests: Recent Labs  Lab 11/18/20 1656 11/19/20 0500 11/20/20 0449  AST 20 21 29   ALT 12 11 14   ALKPHOS 86 67 69  BILITOT 1.2 1.8* 1.2  PROT 5.3*  4.6* 4.8*  ALBUMIN 2.9* 2.4* 2.4*   No results for input(s): LIPASE, AMYLASE in the last 168 hours. Recent Labs  Lab 11/21/20 0830  AMMONIA 48*   Cardiac Enzymes: No results for input(s): CKTOTAL, CKMB, CKMBINDEX, TROPONINI in the last 168 hours. BNP (last 3 results) No results for input(s): BNP in the last 8760 hours.  ProBNP (last 3 results) No results for input(s): PROBNP in the last 8760 hours.  CBG: Recent Labs  Lab 11/21/20 2033 11/21/20 2353 11/22/20 0422 11/22/20 0736 11/22/20 1142  GLUCAP 151* 123* 102* 105* 163*   Recent Results (from  the past 240 hour(s))  Resp Panel by RT-PCR (Flu A&B, Covid) Nasopharyngeal Swab     Status: None   Collection Time: 11/18/20  4:59 PM   Specimen: Nasopharyngeal Swab; Nasopharyngeal(NP) swabs in vial transport medium  Result Value Ref Range Status   SARS Coronavirus 2 by RT PCR NEGATIVE NEGATIVE Final   Influenza A by PCR NEGATIVE NEGATIVE Final   Influenza B by PCR NEGATIVE NEGATIVE Final    Comment: (NOTE) The Xpert Xpress SARS-CoV-2/FLU/RSV plus assay is intended as an aid in the diagnosis of influenza from Nasopharyngeal swab specimens and should not be used as a sole basis for treatment. Nasal washings and aspirates are unacceptable for Xpert Xpress SARS-CoV-2/FLU/RSV testing.  Fact Sheet for Patients: EntrepreneurPulse.com.au  Fact Sheet for Healthcare Providers: IncredibleEmployment.be  This test is not yet approved or cleared by the [place] FDA and has been authorized for detection and/or diagnosis of SARS-CoV-2 by FDA under an Emergency Use Authorization (EUA). This EUA will remain in effect (meaning this test can be used) for the duration of the COVID-19 declaration under Section 564(b)(1) of the Act, 21 U.S.C. section 360bbb-3(b)(1), unless the authorization is terminated or revoked.  Performed at [facility], [place] [address] [place], [place] [address]    Urine Culture     Status: Abnormal   Collection Time: 11/19/20  9:52 AM   Specimen: Urine, Clean Catch  Result Value Ref Range Status   Specimen Description URINE, CLEAN CATCH  Final   Special Requests   Final    NONE Performed at [facility], [place] [address], [place], [place] [address]    Culture MULTIPLE SPECIES PRESENT, SUGGEST RECOLLECTION (A)  Final   Report Status 11/20/2020 FINAL  Final  MRSA Next Gen by PCR, Nasal     Status: None   Collection Time: 11/19/20  4:23 PM   Specimen: Nasal Mucosa; Nasal Swab  Result Value Ref Range Status   MRSA by PCR Next Gen NOT DETECTED NOT DETECTED Final    Comment: (NOTE) The GeneXpert MRSA Assay (FDA approved for NASAL specimens only), is one component of a comprehensive MRSA colonization surveillance program. It is not intended to diagnose MRSA infection nor to guide or monitor treatment for MRSA infections. Test performance is not FDA approved in patients less than 65 years old. Performed at [facility], [place] [address], [place], [place] [address]   Culture, blood (routine x 2)     Status: None (Preliminary result)   Collection Time: 11/20/20  2:48 PM   Specimen: BLOOD RIGHT HAND  Result Value Ref Range Status   Specimen Description BLOOD RIGHT HAND  Final   Special Requests   Final    BOTTLES DRAWN AEROBIC AND ANAEROBIC Blood Culture results may not be optimal due to an inadequate volume of blood received in culture bottles   Culture   Final    NO GROWTH 2 DAYS Performed at [facility], [place] [address], [place], [place] [address]    Report Status PENDING  Incomplete  Culture, blood (routine x 2)     Status: None (Preliminary result)   Collection Time: 11/20/20 10:09 PM   Specimen: BLOOD  Result Value Ref Range Status   Specimen Description BLOOD LEFT ARM  Final   Special Requests   Final    BOTTLES DRAWN AEROBIC AND ANAEROBIC Blood Culture adequate volume   Culture   Final    NO GROWTH 2 DAYS Performed at  [facility], [place] [address] [place], [place] [address]    Report Status PENDING  Incomplete     Studies: EEG adult  Result Date: 2020-11-29 Lora Havens, MD     2020/11/29  3:56 PM Patient Name: RAY GLACKEN MRN: [id number] Epilepsy Attending: Lora Havens Referring Physician/Provider: Dr Roland Rack Date: Nov 29, 2020 Duration: 23.56 mins Patient history: 75yo F with new onset seizure. EEG to evaluate for seizure. Level of alertness: Awake, asleep AEDs during EEG study: LEV Technical aspects: This EEG study was done with scalp electrodes positioned according to the 10-20 International system of electrode placement. Electrical activity was acquired at a sampling rate of 500Hz  and reviewed with a high frequency filter of 70Hz  and a low frequency filter of 1Hz . EEG data were recorded continuously and digitally stored. Description: The posterior dominant rhythm consists of 9 Hz activity of moderate voltage (25-35 uV) seen predominantly in posterior head regions, symmetric and reactive to eye opening and eye closing. Sleep was characterized by vertex waves, sleep spindles (12 to 14 Hz), maximal frontocentral region.  Hyperventilation and photic stimulation were not performed.   IMPRESSION: This study is within normal limits. No seizures or epileptiform discharges were seen throughout the recording. Priyanka Barbra Sarks   Overnight EEG with video  Result Date: 11/22/2020 Lora Havens, MD     11/22/2020  8:36 AM Patient Name: ELVIS BOOT MRN: [id number] Epilepsy Attending: Lora Havens Referring Physician/Provider: Dr Lesleigh Noe Duration: 11/21/2020 1841 to 11/22/2020 0830 Patient history:  75 yo F with new onset progressive confusion and seizures.  EEG to evaluate for seizures. Level of alertness: Awake, asleep AEDs during EEG study: None Technical aspects: This EEG study was done with scalp electrodes positioned according to the 10-20 International system of electrode placement.  Electrical activity was acquired at a sampling rate of 500Hz  and reviewed with a high frequency filter of 70Hz  and a low frequency filter of 1Hz . EEG data were recorded continuously and digitally stored. Description: The posterior dominant rhythm consists of 9 Hz activity of moderate voltage (25-35 uV) seen predominantly in posterior head regions, symmetric and reactive to eye opening and eye closing. Sleep was characterized by vertex waves, sleep spindles (12 to 14 Hz), maximal frontocentral region.  Hyperventilation and photic stimulation were not performed.   IMPRESSION: This study is within normal limits. No seizures or epileptiform discharges were seen throughout the recording. [place]   DG FL GUIDED LUMBAR PUNCTURE  Result Date: 11/29/20 CLINICAL DATA:  Patient admitted for altered mental status and seizures. Radiology asked to perform a diagnostic lumbar puncture. EXAM: DIAGNOSTIC LUMBAR PUNCTURE UNDER FLUOROSCOPIC GUIDANCE COMPARISON:  None FLUOROSCOPY TIME:  Fluoroscopy Time:  30 seconds Radiation Exposure Index (if provided by the fluoroscopic device): 15.6 mGy Number of Acquired Spot Images: 2 PROCEDURE: Informed consent was obtained from the patient's husband prior to the procedure, including potential complications of headache, allergy, and pain. With the patient prone, the lower back was prepped with Betadine. 1% Lidocaine was used for local anesthesia. Lumbar puncture was performed at the L3-L4 level using a 5 inch 20 gauge gauge needle with return of clear CSF with an opening pressure of 13 cm water. 12 ml of CSF were obtained for laboratory studies. The patient tolerated the procedure well and there were no apparent complications. IMPRESSION: Technically successful L3-L4 image-guided lumbar puncture yielding 12 mL of CSF for lab studies. Read by: Soyla Dryer, NP Electronically Signed   By: Rip Harbour  Blietz M.D.   On: 11/20/2020 13:26      Flora Lipps, MD  [facility] 11/22/2020  If 7PM-7AM, please contact night-coverage

## 2020-11-22 NOTE — Progress Notes (Signed)
EEG in doing maintenance. No skin break down noted. Patient event button tested.  

## 2020-11-22 NOTE — Progress Notes (Signed)
D/C patient notified [facility]. No skin break down noticed.

## 2020-11-22 NOTE — Progress Notes (Signed)
Neurology Progress Note  S: Says she is feeling well. Tells stories of past falls, doctor referrals, being across the street, [place] job, and retirement. No c/o except she is not a fan of the mittens on her hands. EEG on. Noticed BP continues to be high.   O: BP 178/70 manually.  Current vital signs: BP (!) 168/118 (BP Location: Right Arm)   Pulse 80   Temp 98.4 F (36.9 C) (Oral)   Resp 20   Ht 5\' 1"  (1.549 m)   Wt 104.3 kg   SpO2 99%   BMI 43.46 kg/m  Vital signs in last 24 hours: Temp:  [98.1 F (36.7 C)-100.1 F (37.8 C)] 98.4 F (36.9 C) (08/24 0735) Pulse Rate:  [75-88] 80 (08/24 0735) Resp:  [18-25] 20 (08/24 0735) BP: (137-174)/(35-118) 168/118 (08/24 0735) SpO2:  [93 %-99 %] 99 % (08/24 0735)  GENERAL: Fairly well appearing, obese WF in NAD.  Awake, alert  HEENT: Normocephalic and atraumatic. EEG leads on.  LUNGS: Normal respiratory effort.  CV: RRR.  Ext: warm.  NEURO:  Mental Status: Alert. Oriented to self, age, day, year, and month. Disoriented to place, knows she is at a part of [facility] but thinks she is across the street. Distracted, but attention is improving.  Able to repeat a string of 5 numbers, but incorrect order when asked for them backwards. Follows commands.   Speech/Language: speech is without aphasia or dysarthria. Slow.  Naming, repetition, fluency, and comprehension intact.  Cranial Nerves:  PERRL. EOMI. Sensation is intact to light touch to face and extremities. Extinction absent to DSS. Smile is symmetrical.  Motor:  RUE: grip 4/5 (mitten on)       bicep 5/5       tricep 5/5 RLE:She is unable to perform thigh and knee due to pain in legs. plantar flexion 5/5  dorsiflexion 5/5 LUE: grip 4/5  bicep 5/5    tricep 5/5 LLE: plantar flexion  5/5 dorsiflexion 5/5 Tone: is normal and bulk is increased.  Sensation- Intact to light touch bilaterally. Extinction absent to DSS.    DTRs:  RUE:  brachioradialis 1      biceps 1 RLE:  patella    0  LUE:   brachioradialis  1  biceps 1 LLE:  patella 0 Gait- deferred.  Medications  Current Facility-Administered Medications:    acetaminophen (TYLENOL) tablet 650 mg, 650 mg, Oral, Q6H PRN, 650 mg at 11/22/20 0014 **OR** acetaminophen (TYLENOL) suppository 650 mg, 650 mg, Rectal, Q6H PRN, Alcario Drought, Jared M, DO   acyclovir (ZOVIRAX) 705 mg in dextrose 5 % 150 mL IVPB, 10 mg/kg (Adjusted), Intravenous, Q12H, Alcario Drought, Jared M, DO, Last Rate: 164.1 mL/hr at 11/21/20 2348, 705 mg at 11/21/20 2348   azithromycin (ZITHROMAX) 500 mg in sodium chloride 0.9 % 250 mL IVPB, 500 mg, Intravenous, QHS, Gardner, Jared M, DO, Last Rate: 250 mL/hr at 11/21/20 2208, 500 mg at 11/21/20 2208   cefTRIAXone (ROCEPHIN) 2 g in sodium chloride 0.9 % 100 mL IVPB, 2 g, Intravenous, QHS, Mariel Aloe, MD, Last Rate: 200 mL/hr at 11/21/20 2117, 2 g at 11/21/20 2117   dextrose 5 %-0.9 % sodium chloride infusion, , Intravenous, Continuous, Reubin Milan, MD, Last Rate: 125 mL/hr at 11/21/20 0505, New Bag at 11/21/20 0505   enoxaparin (LOVENOX) injection 40 mg, 40 mg, Subcutaneous, Q24H, Alcario Drought, Jared M, DO, 40 mg at 11/21/20 0954   insulin aspart (novoLOG) injection 0-9 Units, 0-9 Units, Subcutaneous, Q4H, Alcario Drought, Jared M, DO,  1 Units at 11/21/20 2353   insulin glargine-yfgn (SEMGLEE) injection 20 Units, 20 Units, Subcutaneous, Daily, Etta Quill, DO, 20 Units at 11/21/20 0955   ondansetron (ZOFRAN) tablet 4 mg, 4 mg, Oral, Q6H PRN **OR** ondansetron (ZOFRAN) injection 4 mg, 4 mg, Intravenous, Q6H PRN, Alcario Drought, Jared M, DO   [COMPLETED] thiamine 500mg  in normal saline (60ml) IVPB, 500 mg, Intravenous, Once, Stopped at 11/20/20 1425 **FOLLOWED BY** thiamine (B-1) injection 100 mg, 100 mg, Intravenous, Q24H, Brittnee Gaetano L, MD, 100 mg at 11/21/20 1232  Pertinent Labs TSH  1.288   Vit B12 190. MMA high normal at 335. RPR negative.  HSV PCR pending. LP culture and gram stain pending.     No new imaging  EEG overnight  read 11/22/20:  This study is within normal limits. No seizures or epileptiform discharges were seen throughout the recording.  Assessment: 75 yo female who presented with confusion and seizures. Etiology unclear, so empiric acyclovir was started. Still awaiting culture and gram stain on CSR and HSV PCR. Her confusion is fortunately improved over past exams, but she is still confused.  Her Keppra was discontinued yesterday by attending due to concern that it may be contributing to her encephalopathy and lack of clarity as to whether the event witnessed at home was truly a seizure. Today's overnight read is negative for seizures or epileptogenic activity as well, off of antiseizure medications. We will continue her Acyclovir until we get HSV PCR back.  Differential is concern for posterior circulation abnormality and that improvement in exam may be that her elevated BP is compensating. We will check CTA head and neck.   Impression: -encephalopathy, unknown etiology at this time.  -Negative EEGs.   Recommendations/Plan:  -BP per medicine. Orthostatic VS ordered.  -Watch off of Keppra.  -d/c LTM EEG.   -Continue Acyclovir for now.  -check intracranial and extracranial circulation with CTA head and neck.  -follow HSV PCR and culture and gram stain from CSF.  -Continue Vit B12 (500 mcg daily) and Thiamine supplementation.   Pt seen by Clance Boll, MSN, APN-BC/Nurse Practitioner/Neuro and later by MD. Note and plan to be edited as needed by MD.  Pager: [phone]  Attending Neurologist's note:  I personally saw this patient, gathering history, performing a neurologic examination, reviewing relevant labs, personally reviewing relevant imaging as documented above, and formulated the assessment and plan, adding the note above for completeness and clarity to accurately reflect my thoughts  Lesleigh Noe MD-PhD [facility] [phone]  Available 7 AM to 7 PM, outside these  hours please contact Neurologist on call listed on AMION

## 2020-11-22 NOTE — Therapy (Signed)
OT Cancellation Note  Patient Details Name: ADLENE ADDUCI MRN: [id number] DOB: 02-May-1945   Cancelled Treatment:    Reason Eval/Treat Not Completed: Patient at procedure or test/ unavailable (Pt currently undergoing EEG testing at bed level. Spoke with RN and will check back as able for OT evaluation.)  Almyra Deforest, OTR/L 11/22/2020, 8:50 AM

## 2020-11-22 NOTE — Procedures (Addendum)
Patient Name: AALAIYAH YASSIN  MRN: [id number]  Epilepsy Attending: Lora Havens  Referring Physician/Provider: Dr Lesleigh Noe Duration: 11/21/2020 1841 to 11/22/2020 1024  Patient history:  75 yo F with new onset progressive confusion and seizures.  EEG to evaluate for seizures.  Level of alertness: Awake, asleep  AEDs during EEG study: None  Technical aspects: This EEG study was done with scalp electrodes positioned according to the 10-20 International system of electrode placement. Electrical activity was acquired at a sampling rate of 500Hz  and reviewed with a high frequency filter of 70Hz  and a low frequency filter of 1Hz . EEG data were recorded continuously and digitally stored.   Description: The posterior dominant rhythm consists of 9 Hz activity of moderate voltage (25-35 uV) seen predominantly in posterior head regions, symmetric and reactive to eye opening and eye closing. Sleep was characterized by vertex waves, sleep spindles (12 to 14 Hz), maximal frontocentral region.  Hyperventilation and photic stimulation were not performed.     IMPRESSION: This study is within normal limits. No seizures or epileptiform discharges were seen throughout the recording.  Vonetta Foulk Barbra Sarks

## 2020-11-22 NOTE — Progress Notes (Signed)
PT Cancellation Note  Patient Details Name: Elizabeth Crosby MRN: [id number] DOB: 1946/01/25   Cancelled Treatment:    Reason Eval/Treat Not Completed: Medical issues which prohibited therapy (Pt is in CT.  Will check back tomorrow.)   Alvira Philips 11/22/2020, 12:48 PM Ashonti Leandro M,PT Acute Rehab Services [phone] (pager)

## 2020-11-23 DIAGNOSIS — E119 Type 2 diabetes mellitus without complications: Secondary | ICD-10-CM | POA: Diagnosis not present

## 2020-11-23 DIAGNOSIS — G9340 Encephalopathy, unspecified: Secondary | ICD-10-CM | POA: Diagnosis not present

## 2020-11-23 DIAGNOSIS — J189 Pneumonia, unspecified organism: Secondary | ICD-10-CM | POA: Diagnosis not present

## 2020-11-23 DIAGNOSIS — Z794 Long term (current) use of insulin: Secondary | ICD-10-CM | POA: Diagnosis not present

## 2020-11-23 DIAGNOSIS — I10 Essential (primary) hypertension: Secondary | ICD-10-CM | POA: Diagnosis not present

## 2020-11-23 LAB — CBC
HCT: 32.2 % — ABNORMAL LOW (ref 36.0–46.0)
Hemoglobin: 10.7 g/dL — ABNORMAL LOW (ref 12.0–15.0)
MCH: 30.3 pg (ref 26.0–34.0)
MCHC: 33.2 g/dL (ref 30.0–36.0)
MCV: 91.2 fL (ref 80.0–100.0)
Platelets: 132 10*3/uL — ABNORMAL LOW (ref 150–400)
RBC: 3.53 MIL/uL — ABNORMAL LOW (ref 3.87–5.11)
RDW: 14.3 % (ref 11.5–15.5)
WBC: 9.5 10*3/uL (ref 4.0–10.5)
nRBC: 0 % (ref 0.0–0.2)

## 2020-11-23 LAB — PHOSPHORUS: Phosphorus: 1.7 mg/dL — ABNORMAL LOW (ref 2.5–4.6)

## 2020-11-23 LAB — MAGNESIUM: Magnesium: 1.1 mg/dL — ABNORMAL LOW (ref 1.7–2.4)

## 2020-11-23 LAB — BASIC METABOLIC PANEL
Anion gap: 6 (ref 5–15)
BUN: 20 mg/dL (ref 8–23)
CO2: 24 mmol/L (ref 22–32)
Calcium: 7.9 mg/dL — ABNORMAL LOW (ref 8.9–10.3)
Chloride: 113 mmol/L — ABNORMAL HIGH (ref 98–111)
Creatinine, Ser: 1.21 mg/dL — ABNORMAL HIGH (ref 0.44–1.00)
GFR, Estimated: 47 mL/min — ABNORMAL LOW (ref >60)
Glucose, Bld: 92 mg/dL (ref 70–99)
Potassium: 3.7 mmol/L (ref 3.5–5.1)
Sodium: 143 mmol/L (ref 135–145)

## 2020-11-23 LAB — GLUCOSE, CAPILLARY
Glucose-Capillary: 77 mg/dL (ref 70–99)
Glucose-Capillary: 98 mg/dL (ref 70–99)
Glucose-Capillary: 164 mg/dL — ABNORMAL HIGH (ref 70–99)
Glucose-Capillary: 140 mg/dL — ABNORMAL HIGH (ref 70–99)
Glucose-Capillary: 131 mg/dL — ABNORMAL HIGH (ref 70–99)

## 2020-11-23 LAB — VITAMIN B1: Vitamin B1 (Thiamine): 99 nmol/L (ref 66.5–200.0)

## 2020-11-23 MED ORDER — K PHOS MONO-SOD PHOS DI & MONO 155-852-130 MG PO TABS
500.0000 mg | ORAL_TABLET | Freq: Three times a day (TID) | ORAL | Status: DC
  Administered 2020-11-23 – 2020-11-25 (×8): 500 mg via ORAL
  Filled 2020-11-23 (×8): qty 2

## 2020-11-23 MED ORDER — IRBESARTAN 300 MG PO TABS
300.0000 mg | ORAL_TABLET | Freq: Every day | ORAL | Status: DC
  Administered 2020-11-23 – 2020-11-25 (×3): 300 mg via ORAL
  Filled 2020-11-23 (×3): qty 1

## 2020-11-23 MED ORDER — THIAMINE HCL 100 MG PO TABS
100.0000 mg | ORAL_TABLET | Freq: Every day | ORAL | Status: DC
  Administered 2020-11-23 – 2020-11-25 (×3): 100 mg via ORAL
  Filled 2020-11-23 (×3): qty 1

## 2020-11-23 MED ORDER — MAGNESIUM SULFATE 2 GM/50ML IV SOLN
2.0000 g | Freq: Once | INTRAVENOUS | Status: AC
  Administered 2020-11-23: 2 g via INTRAVENOUS
  Filled 2020-11-23: qty 50

## 2020-11-23 MED ORDER — HYDRALAZINE HCL 20 MG/ML IJ SOLN: 10.0000 mg | Freq: Four times a day (QID) | INTRAMUSCULAR | Status: DC | PRN

## 2020-11-23 MED ORDER — PANTOPRAZOLE SODIUM 40 MG PO TBEC
40.0000 mg | DELAYED_RELEASE_TABLET | Freq: Every day | ORAL | Status: DC
  Administered 2020-11-23 – 2020-11-25 (×3): 40 mg via ORAL
  Filled 2020-11-23 (×3): qty 1

## 2020-11-23 MED ORDER — TELMISARTAN-HCTZ 80-25 MG PO TABS: 1.0000 | ORAL_TABLET | Freq: Every day | ORAL | Status: DC

## 2020-11-23 MED ORDER — HYDROCHLOROTHIAZIDE 25 MG PO TABS
25.0000 mg | ORAL_TABLET | Freq: Every day | ORAL | Status: DC
  Administered 2020-11-23 – 2020-11-25 (×3): 25 mg via ORAL
  Filled 2020-11-23 (×3): qty 1

## 2020-11-23 NOTE — Evaluation (Signed)
Occupational Therapy Evaluation Patient Details Name: Elizabeth Crosby MRN: [id number] DOB: [birth year] Today's Date: 11/23/2020    History of Present Illness 75yo female who presented to the ED on 11/18/20 with progressive AMS, and had seizure on route to the hospital. Admitted with acute encephalopathy, seizures, and LLL PNA. PMH CKD, DM, gout, HLD, HTN, lumbar pain, endometrial CA, SDH   Clinical Impression   Patient is a poor historian with history obtained from daughter via phone call. PTA patient was living with her spouse in a private residence and was grossly Mod I with ADLs/IADLs with use AD. Daughter reports patient uses SPC in home and rollator in community dwellings. Patient was driving and working full-time in [employer] at [employer] up until 8/19. Limited mobility PTA secondary to chronic OA in R knee s/p Cortizone shots (due for another now). Patient currently functioning below baseline demonstrating observed ADLs including LB dressing with Min to Max A and bed mobility/functional transfers with Min to Mod A with use of RW. Patient also limited by deficits listed below including generalized weakness, decreased cognition, and decreased balance and would benefit from continued acute OT services in prep for safe d/c to next level of care. Patient and family in agreement with SNF rehab.     Follow Up Recommendations  SNF;Supervision/Assistance - 24 hour    Equipment Recommendations  Other (comment) (Defer to next level of care.)    Recommendations for Other Services       Precautions / Restrictions Precautions Precautions: Fall;Other (comment) Precaution Comments: watch BPs; HOH Restrictions Weight Bearing Restrictions: No      Mobility Bed Mobility Overal bed mobility: Needs Assistance Bed Mobility: Supine to Sit     Supine to sit: Min assist     General bed mobility comments: Patient able to advance BLE from bed surface to EOB without external assist; Min A to elevate  trunk with HOB elevated    Transfers Overall transfer level: Needs assistance Equipment used: Rolling walker (2 wheeled) Transfers: Sit to/from [facility] Sit to Stand: Min assist;Mod assist Stand pivot transfers: Min assist       General transfer comment: Min A for sit to stand from elevated EOB; Mod A for sit to stand from low surfaces; Min A for stand-pivot to recliner on L. Requires increased time secondary to pain in R knee.    Balance Overall balance assessment: Needs assistance Sitting-balance support: Single extremity supported;No upper extremity supported;Feet supported Sitting balance-Leahy Scale: Fair Sitting balance - Comments: Supervision A to maintain static sitting balance at EOB.   Standing balance support: Bilateral upper extremity supported;During functional activity Standing balance-Leahy Scale: Fair Standing balance comment: Reliant on BUE on RW with static/dynamic balance                           ADL either performed or assessed with clinical judgement   ADL Overall ADL's : Needs assistance/impaired Eating/Feeding: Set up                   Lower Body Dressing: Maximal assistance;Sit to/from stand Lower Body Dressing Details (indicate cue type and reason): Max A to don footwear seated EOB. Toilet Transfer: Minimal Insurance claims handler Details (indicate cue type and reason): Simulated with stand-pivot to recliner with Min A and use of RW; requries increased time seconary to pain in R knee.         Functional mobility during ADLs: Minimal assistance;Rolling walker  Vision Baseline Vision/History: 1 Wears glasses Ability to See in Adequate Light: 0 Adequate Patient Visual Report: No change from baseline       Perception     Praxis      Pertinent Vitals/Pain Pain Assessment: Faces Faces Pain Scale: Hurts little more Pain Location: R knee (chronic) "bone on bone" Pain Descriptors / Indicators:  Aching;Sore Pain Intervention(s): Limited activity within patient's tolerance;Monitored during session;Repositioned     Hand Dominance Right   Extremity/Trunk Assessment Upper Extremity Assessment Upper Extremity Assessment: Generalized weakness   Lower Extremity Assessment Lower Extremity Assessment: Defer to PT evaluation   Cervical / Trunk Assessment Cervical / Trunk Assessment: Kyphotic;Other exceptions Cervical / Trunk Exceptions: large body habitus   Communication Communication Communication: No difficulties   Cognition Arousal/Alertness: Awake/alert Behavior During Therapy: WFL for tasks assessed/performed Overall Cognitive Status: Impaired/Different from baseline Area of Impairment: Attention;Memory;Following commands;Safety/judgement;Awareness;Problem solving                 Orientation Level: Disoriented to;Situation Current Attention Level: Sustained Memory: Decreased short-term memory Following Commands: Follows one step commands with increased time Safety/Judgement: Decreased awareness of safety;Decreased awareness of deficits Awareness: Emergent Problem Solving: Slow processing;Decreased initiation;Difficulty sequencing;Requires verbal cues;Requires tactile cues General Comments: A&Ox3; perseverates on telling this writer about her job; follows 1-step verbal commands with increased time; requires repeat cues for completion of tasks   General Comments  VSS on RA.    Exercises     Shoulder Instructions      Home Living Family/patient expects to be discharged to:: Private residence Living Arrangements: Spouse/significant other Available Help at Discharge: Family;Available 24 hours/day Type of Home: House Home Access: Stairs to enter [employer] of Steps: 4 +1 STE at [employer]: Right Home Layout: One level     Bathroom Shower/Tub: Walk-in shower;Tub/shower unit   Bathroom Toilet: Standard     Home Equipment:  Environmental consultant - 4 wheels;Cane - single point;Wheelchair - manual          Prior Functioning/Environment Level of Independence: Needs assistance  Gait / Transfers Assistance Needed: SPC in home and rollator in community dwellings ADL's / [place] Needed: Mod I with ADLs; 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 OT Goal Formulation: With patient Time For Goal Achievement: 12/07/20 Potential to Achieve Goals: Good ADL Goals Pt Will Perform Grooming: standing;with supervision Pt Will Perform Upper Body Dressing: sitting;with set-up Pt Will Perform Lower Body Dressing: with supervision;sit to/from stand;with adaptive equipment Pt Will Transfer to Toilet: with supervision;ambulating;bedside commode Pt Will Perform Toileting - Clothing Manipulation and hygiene: with supervision;sit to/from stand Additional ADL Goal #1: Patient will follow 1-step verbal commands with 95% accuracy in prep for ADLs.  OT Frequency: Min 2X/week   Barriers to D/C:            Co-evaluation              AM-PAC OT "6 Clicks" Daily Activity     Outcome Measure Help from another person eating meals?: A Little Help from another person taking care of personal grooming?: A Little Help from another person toileting,  which includes using toliet, bedpan, or urinal?: A Lot Help from another person bathing (including washing, rinsing, drying)?: A Lot Help from another person to  put on and taking off regular upper body clothing?: A Little Help from another person to put on and taking off regular lower body clothing?: A Lot 6 Click Score: 15   End of Session Equipment Utilized During Treatment: Gait belt;Rolling walker Nurse Communication: Mobility status  Activity Tolerance: Patient tolerated treatment well Patient left: in chair;with call bell/phone within reach;with chair alarm set  OT Visit Diagnosis: Unsteadiness on feet (R26.81);Muscle weakness (generalized) (M62.81);History of falling (Z91.81)                Time: 9892-1194 OT Time Calculation (min): 21 min Charges:  OT General Charges $OT Visit: 1 Visit OT Evaluation $OT Eval Moderate Complexity: 1 Mod  Kasiah Manka H. OTR/L Supplemental OT, Department of rehab services [phone]  Pedram Goodchild R H. 11/23/2020, 9:42 AM

## 2020-11-23 NOTE — TOC Progression Note (Signed)
Transition of Care [facility]) - Progression Note    Patient Details  Name: Elizabeth Crosby MRN: [id number] Date of Birth: [birth year]  [facility]) CM/SW Contact  Joanne Chars, LCSW Phone Number: 11/23/2020, 10:46 AM  Clinical Narrative:   Phoebe Perch completed with OT eval.  Pt sent out for SNF.     Expected Discharge Plan: [place] Barriers to Discharge: Continued Medical Work up, SNF Pending bed offer  Expected Discharge Plan and Services Expected Discharge Plan: [place] Choice: [place] arrangements for the past 2 months: Single Family Home                                       Social Determinants of Health (SDOH) Interventions    Readmission Risk Interventions No flowsheet data found.

## 2020-11-23 NOTE — Progress Notes (Signed)
Physical Therapy Treatment Patient Details Name: Elizabeth Crosby MRN: [id number] DOB: 23-Dec-1945 Today's Date: 11/23/2020    History of Present Illness 75yo female who presented to the ED on 11/18/20 with progressive AMS, and had seizure on route to the hospital. Admitted with acute encephalopathy, seizures, and LLL PNA. PMH CKD, DM, gout, HLD, HTN, lumbar pain, endometrial CA, SDH    PT Comments    Pt making slow, steady progress with mobility and able to amb short distance in room today with mod assist. Continue to feel pt will need SNF at dc for further rehab.    Follow Up Recommendations  SNF     Equipment Recommendations  Wheelchair (measurements PT);Wheelchair cushion (measurements PT)    Recommendations for Other Services       Precautions / Restrictions Precautions Precautions: Fall;Other (comment) Precaution Comments: watch BPs; HOH    Mobility  Bed Mobility               General bed mobility comments: Pt up in chair    Transfers Overall transfer level: Needs assistance Equipment used: Rolling walker (2 wheeled) Transfers: Sit to/from Stand Sit to Stand: Mod assist;+2 safety/equipment         General transfer comment: Assist to bring hips up from low recliner. Verbal/tactile cues for hand placement  Ambulation/Gait Ambulation/Gait assistance: Mod assist;+2 safety/equipment;Min assist Gait Distance (Feet): 5 Feet Assistive device: Rolling walker (2 wheeled) Gait Pattern/deviations: Step-to pattern;Decreased step length - left;Decreased step length - right;Trunk flexed Gait velocity: decr Gait velocity interpretation: <1.31 ft/sec, indicative of household ambulator General Gait Details: Assist for balance and support. Assist to move walker forward with pt tending to step past the front of the walker. Verbal/tactile cues to stand more erect.   Stairs             Wheelchair Mobility    Modified Rankin (Stroke Patients Only)       Balance  Overall balance assessment: Needs assistance Sitting-balance support: No upper extremity supported;Feet supported Sitting balance-Leahy Scale: Fair     Standing balance support: Bilateral upper extremity supported;During functional activity Standing balance-Leahy Scale: Poor Standing balance comment: walker and min assist for static standing                            Cognition Arousal/Alertness: Awake/alert Behavior During Therapy: WFL for tasks assessed/performed Overall Cognitive Status: Impaired/Different from baseline Area of Impairment: Attention;Memory;Following commands;Safety/judgement;Awareness;Problem solving                 Orientation Level: Disoriented to;Situation Current Attention Level: Sustained Memory: Decreased short-term memory Following Commands: Follows one step commands with increased time Safety/Judgement: Decreased awareness of safety;Decreased awareness of deficits Awareness: Emergent Problem Solving: Slow processing;Decreased initiation;Difficulty sequencing;Requires verbal cues;Requires tactile cues        Exercises      General Comments General comments (skin integrity, edema, etc.): VSS on RA      Pertinent Vitals/Pain Pain Assessment: Faces Faces Pain Scale: Hurts little more Pain Location: R knee (chronic) "bone on bone" Pain Descriptors / Indicators: Guarding;Grimacing Pain Intervention(s): Limited activity within patient's tolerance    Home Living                      Prior Function            PT Goals (current goals can now be found in the care plan section) Progress towards PT goals: Progressing toward goals  Frequency    Min 2X/week      PT Plan Current plan remains appropriate;Frequency needs to be updated    Co-evaluation              AM-PAC PT "6 Clicks" Mobility   Outcome Measure  Help needed turning from your back to your side while in a flat bed without using bedrails?: A  Little Help needed moving from lying on your back to sitting on the side of a flat bed without using bedrails?: A Little Help needed moving to and from a bed to a chair (including a wheelchair)?: A Lot Help needed standing up from a chair using your arms (e.g., wheelchair or bedside chair)?: A Lot Help needed to walk in hospital room?: A Lot Help needed climbing 3-5 steps with a railing? : Total 6 Click Score: 13    End of Session Equipment Utilized During Treatment: Gait belt Activity Tolerance: Patient limited by fatigue Patient left: in chair;with call bell/phone within reach;with chair alarm set Nurse Communication: Mobility status PT Visit Diagnosis: Muscle weakness (generalized) (M62.81);Difficulty in walking, not elsewhere classified (R26.2);Unsteadiness on feet (R26.81)     Time: 7972-8206 PT Time Calculation (min) (ACUTE ONLY): 19 min  Charges:  $Gait Training: 8-22 mins                     [place] Pager [phone] Office [place] 11/23/2020, 2:36 PM

## 2020-11-23 NOTE — Progress Notes (Addendum)
PROGRESS NOTE  ACHSAH Crosby XHB:[id number] DOB: Jan 11, 1946 DOA: 11/18/2020 PCP: [name], MD   LOS: 4 days   Brief narrative:  Elizabeth Crosby is a 75 y.o.  female with medical history significant of endometrial CA s/p radiation, diabetes mellitus type 2, hypertension, obstructive sleep apnea presented to the hospital with secondary to altered mental status and seizure witnessed during EMS transportation.  Patient was found to have a LLL pneumonia and was started on antibiotics. Neurology was consulted consulted with recommendation for LP studies and EEG.  During hospitalization, patient has improved mentation.  No seizure-like activity was noted.  Patient's encephalopathy was thought to be secondary to pneumonia.  Assessment/Plan:  Principal Problem:   Acute encephalopathy Active Problems:   DM2 (diabetes mellitus, type 2) (HCC)   HTN (hypertension)   Left lower lobe pneumonia   Acute metabolic encephalopathy  Likely secondary to pneumonia.  MRI without any acute process.  Neurology was consulted and patient underwent lumbar puncture on 8/22 not consistent with infection.  Patient was initially empirically on acyclovir which has been discontinued at this time.  Keppra has been discontinued as per neurology..  Patient underwent long-term EEG monitoring as well without features of seizure disorder..  Neurology okay with vitamin B12 and thiamine supplementation.  Seizures ruled out.  Avoid sedative medications as possible.   Left lower lobe pneumonia Continue Ceftriaxone IV and azithromycin IV.  Blood cultures negative in 3 days.  Might benefit from assessing for sleep apnea as outpatient. On room air.  Diabetes mellitus, type 2 Continue sliding scale insulin and long-acting insulin.  Currently on Semglee 20 units.  Patient is on Tresiba 40 units at home.  Discontinue D5 normal saline.  Latest POC glucose of 164.  Hypomagnesemia.  Magnesium of 1.1 today.  We will replenish with IV  magnesium sulfate. Check levels in am.  Hypophosphatemia.  We will replenish with K-Phos.  Check levels in a.m.  Essential hypertension Blood pressure accelerated today.  Patient is on telmesartan, hydrochlorothiazide at home.  Has been resumed at this time.  As needed hydralazine.  Blood pressure goal 1 81-0 40 systolic.  Will benefit from sleep study as outpatient.  Hypokalemia.  Was able to tolerate p.o. potassium though it is listed as allergy.  Potassium has improved today at 3.7   Debility, deconditioning.  PT recommended skilled nursing facility placement.  DVT prophylaxis: enoxaparin (LOVENOX) injection 40 mg Start: 11/19/20 1000  Code Status: Full code  Family Communication:  I spoke with the patient's daughter Elizabeth Crosby on the phone and updated her about the clinical condition of the patient.   Status is: Inpatient  Remains inpatient appropriate because:IV treatments appropriate due to intensity of illness or inability to take PO, Inpatient level of care appropriate due to severity of illness, and need for rehabilitation  Dispo: The patient is from: Home              Anticipated d/c is to: SNF              Patient currently is not medically stable to d/c.   Difficult to place patient No  Consultants: Neurology  Procedures: EEG  Anti-infectives: Will dc acyclovir.  Subjective: Patient denies any headache, nausea, vomiting, fever or chills.  Feels better.  Answering appropriately.  Objective: Vitals:   11/23/20 0350 11/23/20 0927  BP: (!) 193/56 (!) 170/83  Pulse: 87 85  Resp: 17 19  Temp: 99.4 F (37.4 C)   SpO2: 97%  Intake/Output Summary (Last 24 hours) at 11/23/2020 1149 Last data filed at 11/23/2020 1000 Gross per 24 hour  Intake 120 ml  Output 100 ml  Net 20 ml    Filed Weights   11/18/20 2330  Weight: 104.3 kg   Body mass index is 43.46 kg/m.   Physical Exam: GENERAL: Patient is alert awake and oriented. Not in obvious distress.  Morbidly  obese HENT: No scleral pallor or icterus. Pupils equally reactive to light. Oral mucosa is moist NECK: is supple, no gross swelling noted. CHEST: Clear to auscultation. No crackles or wheezes.  Diminished breath sounds bilaterally. CVS: S1 and S2 heard, no murmur. Regular rate and rhythm.  ABDOMEN: Soft, non-tender, bowel sounds are present. EXTREMITIES: No edema.  Bilateral upper extremity mittens. CNS: Cranial nerves are intact. No focal motor deficits. SKIN: warm and dry without rashes.  Data Review: I have personally reviewed the following laboratory data and studies,  CBC: Recent Labs  Lab 11/18/20 1656 11/18/20 1709 11/19/20 0500 11/20/20 0449 11/22/20 0308 11/23/20 0237  WBC 6.1  --  13.5* 11.1* 9.0 9.5  NEUTROABS 3.2  --   --   --   --   --   HGB 12.0 11.6* 11.4* 12.1 10.6* 10.7*  HCT 36.9 34.0* 35.9* 36.8 32.2* 32.2*  MCV 94.1  --  96.5 92.5 91.2 91.2  PLT 135*  --  116* 130* 137* 132*    Basic Metabolic Panel: Recent Labs  Lab 11/19/20 0500 11/20/20 0449 11/21/20 1406 11/22/20 0308 11/23/20 0237  NA 143 141 141 142 143  K 3.5 3.3* 3.3* 3.4* 3.7  CL 112* 112* 114* 113* 113*  CO2 23 21* 22 23 24   GLUCOSE 232* 135* 198* 124* 92  BUN 20 21 16 18 20   CREATININE 1.19* 1.26* 1.25* 1.14* 1.21*  CALCIUM 8.0* 7.9* 7.6* 7.6* 7.9*  MG  --   --   --   --  1.1*  PHOS  --   --   --   --  1.7*    Liver Function Tests: Recent Labs  Lab 11/18/20 1656 11/19/20 0500 11/20/20 0449  AST 20 21 29   ALT 12 11 14   ALKPHOS 86 67 69  BILITOT 1.2 1.8* 1.2  PROT 5.3* 4.6* 4.8*  ALBUMIN 2.9* 2.4* 2.4*    No results for input(s): LIPASE, AMYLASE in the last 168 hours. Recent Labs  Lab 11/21/20 0830  AMMONIA 48*    Cardiac Enzymes: No results for input(s): CKTOTAL, CKMB, CKMBINDEX, TROPONINI in the last 168 hours. BNP (last 3 results) No results for input(s): BNP in the last 8760 hours.  ProBNP (last 3 results) No results for input(s): PROBNP in the last 8760  hours.  CBG: Recent Labs  Lab 11/22/20 2044 11/22/20 2337 11/22/20 2348 11/23/20 0356 11/23/20 0730  GLUCAP 104* 117* 110* 77 98    Recent Results (from the past 240 hour(s))  Resp Panel by RT-PCR (Flu A&B, Covid) Nasopharyngeal Swab     Status: None   Collection Time: 11/18/20  4:59 PM   Specimen: Nasopharyngeal Swab; Nasopharyngeal(NP) swabs in vial transport medium  Result Value Ref Range Status   SARS Coronavirus 2 by RT PCR NEGATIVE NEGATIVE Final   Influenza A by PCR NEGATIVE NEGATIVE Final   Influenza B by PCR NEGATIVE NEGATIVE Final    Comment: (NOTE) The Xpert Xpress SARS-CoV-2/FLU/RSV plus assay is intended as an aid in the diagnosis of influenza from Nasopharyngeal swab specimens and should not be used as a  sole basis for treatment. Nasal washings and aspirates are unacceptable for Xpert Xpress SARS-CoV-2/FLU/RSV testing.  Fact Sheet for Patients: EntrepreneurPulse.com.au  Fact Sheet for Healthcare Providers: IncredibleEmployment.be  This test is not yet approved or cleared by the [place] FDA and has been authorized for detection and/or diagnosis of SARS-CoV-2 by FDA under an Emergency Use Authorization (EUA). This EUA will remain in effect (meaning this test can be used) for the duration of the COVID-19 declaration under Section 564(b)(1) of the Act, 21 U.S.C. section 360bbb-3(b)(1), unless the authorization is terminated or revoked.  Performed at [facility], [place] [address], [place], [place] [address]   Urine Culture     Status: Abnormal   Collection Time: 11/19/20  9:52 AM   Specimen: Urine, Clean Catch  Result Value Ref Range Status   Specimen Description URINE, CLEAN CATCH  Final   Special Requests   Final    NONE Performed at [facility], [place] [address] [place], [place] [address]    Culture MULTIPLE SPECIES PRESENT, SUGGEST RECOLLECTION (A)  Final   Report Status 11/20/2020 FINAL  Final   MRSA Next Gen by PCR, Nasal     Status: None   Collection Time: 11/19/20  4:23 PM   Specimen: Nasal Mucosa; Nasal Swab  Result Value Ref Range Status   MRSA by PCR Next Gen NOT DETECTED NOT DETECTED Final    Comment: (NOTE) The GeneXpert MRSA Assay (FDA approved for NASAL specimens only), is one component of a comprehensive MRSA colonization surveillance program. It is not intended to diagnose MRSA infection nor to guide or monitor treatment for MRSA infections. Test performance is not FDA approved in patients less than 28 years old. Performed at [facility], [place] [address], [place], [place] [address]   Culture, blood (routine x 2)     Status: None (Preliminary result)   Collection Time: 11/20/20  2:48 PM   Specimen: BLOOD RIGHT HAND  Result Value Ref Range Status   Specimen Description BLOOD RIGHT HAND  Final   Special Requests   Final    BOTTLES DRAWN AEROBIC AND ANAEROBIC Blood Culture results may not be optimal due to an inadequate volume of blood received in culture bottles   Culture   Final    NO GROWTH 3 DAYS Performed at [facility], [place] [address], [place], [place] [address]    Report Status PENDING  Incomplete  Culture, blood (routine x 2)     Status: None (Preliminary result)   Collection Time: 11/20/20 10:09 PM   Specimen: BLOOD  Result Value Ref Range Status   Specimen Description BLOOD LEFT ARM  Final   Special Requests   Final    BOTTLES DRAWN AEROBIC AND ANAEROBIC Blood Culture adequate volume   Culture   Final    NO GROWTH 3 DAYS Performed at [facility], [place] [address] [place],  [address]    Report Status PENDING  Incomplete      Studies: CT ANGIO HEAD W OR WO CONTRAST  Result Date: 11/22/2020 CLINICAL DATA:  Acute neuro deficit.  Stroke symptoms. EXAM: CT ANGIOGRAPHY HEAD AND NECK TECHNIQUE: Multidetector CT imaging of the head and neck was performed using the standard protocol during bolus administration of intravenous  contrast. Multiplanar CT image reconstructions and MIPs were obtained to evaluate the vascular anatomy. Carotid stenosis measurements (when applicable) are obtained utilizing NASCET criteria, using the distal internal carotid diameter as the denominator. CONTRAST:  8mL OMNIPAQUE IOHEXOL 350 MG/ML SOLN COMPARISON:  MRI head 11/19/2020.  CT head 11/18/2020 FINDINGS: CT HEAD FINDINGS Brain: No evidence of acute infarction, hemorrhage, hydrocephalus, extra-axial collection or mass lesion/mass effect. Mild white matter hypodensity bilaterally unchanged Vascular: Negative for hyperintense vessel Skull: Negative Sinuses: Retention cyst left maxillary sinus otherwise clear sinuses. Orbits: Negative for orbital mass.  Bilateral cataract extraction Review of the MIP images confirms the above findings CTA NECK FINDINGS Aortic arch: Negative for aortic arch aneurysm or significant atherosclerotic disease. Proximal great vessels widely patent. Proximal left vertebral artery appears to have origin from both the aortic arch and the left subclavian artery. Right carotid system: Right carotid bifurcation widely patent without stenosis. Fusiform dilatation of the right internal carotid artery below the scale skull base measuring up to 8 mm in diameter compared with expected diameter 4 mm more proximally. Possible mild FMD. No dissection. Left carotid system: Atherosclerotic calcification left carotid bifurcation causing approximately 50% diameter stenosis proximal left internal carotid artery. Fusiform dilatation left internal carotid artery below the skull base measuring up to 6 mm in diameter. Possible mild FMD. Negative for dissection. Vertebral arteries: Both vertebral arteries patent to the basilar without stenosis. Skeleton: Negative Other neck: Negative Upper chest: Lung apices clear bilaterally. Review of the MIP images confirms the above findings CTA HEAD FINDINGS Anterior circulation: Mild atherosclerotic calcification in  the cavernous carotid bilaterally without stenosis. Anterior and middle cerebral arteries patent without stenosis or large vessel occlusion. Posterior circulation: Both vertebral arteries patent to the basilar. Basilar widely patent. Posterior circulation patent without stenosis or large vessel occlusion. Venous sinuses: Normal venous enhancement Anatomic variants: None Review of the MIP images confirms the above findings IMPRESSION: 1. CT head negative for acute abnormality. 2. Negative for intracranial large vessel occlusion 3. 50% diameter stenosis proximal left internal carotid artery due to atherosclerotic calcification. Right vertebral artery widely patent 4. Fusiform dilatation of the internal carotid artery below the skull base bilaterally possibly related to fibromuscular dysplasia. No dissection identified. 5. Both vertebral arteries widely patent. Electronically Signed   By: Franchot Gallo M.D.   On: 11/22/2020 15:10   CT ANGIO NECK W OR WO CONTRAST  Result Date: 11/22/2020 CLINICAL DATA:  Acute neuro deficit.  Stroke symptoms. EXAM: CT ANGIOGRAPHY HEAD AND NECK TECHNIQUE: Multidetector CT imaging of the head and neck was performed using the standard protocol during bolus administration of intravenous contrast. Multiplanar CT image reconstructions and MIPs were obtained to evaluate the vascular anatomy. Carotid stenosis measurements (when applicable) are obtained utilizing NASCET criteria, using the distal internal carotid diameter as the denominator. CONTRAST:  20mL OMNIPAQUE IOHEXOL 350 MG/ML SOLN COMPARISON:  MRI head 11/19/2020.  CT head 11/18/2020 FINDINGS: CT HEAD FINDINGS Brain: No evidence of acute infarction, hemorrhage, hydrocephalus, extra-axial collection or mass lesion/mass effect. Mild white matter hypodensity bilaterally unchanged Vascular: Negative for hyperintense vessel Skull: Negative Sinuses: Retention cyst left maxillary sinus otherwise clear sinuses. Orbits: Negative for orbital  mass.  Bilateral cataract extraction Review of the MIP images confirms the above findings CTA NECK FINDINGS Aortic arch: Negative for aortic arch aneurysm or significant atherosclerotic disease. Proximal great vessels widely patent. Proximal left vertebral artery appears to have origin from both the aortic arch and the left subclavian artery. Right carotid system: Right carotid bifurcation widely patent without stenosis. Fusiform dilatation of the right internal carotid artery below the scale skull base measuring up to 8 mm in diameter compared with expected diameter 4 mm more proximally. Possible mild FMD.  No dissection. Left carotid system: Atherosclerotic calcification left carotid bifurcation causing approximately 50% diameter stenosis proximal left internal carotid artery. Fusiform dilatation left internal carotid artery below the skull base measuring up to 6 mm in diameter. Possible mild FMD. Negative for dissection. Vertebral arteries: Both vertebral arteries patent to the basilar without stenosis. Skeleton: Negative Other neck: Negative Upper chest: Lung apices clear bilaterally. Review of the MIP images confirms the above findings CTA HEAD FINDINGS Anterior circulation: Mild atherosclerotic calcification in the cavernous carotid bilaterally without stenosis. Anterior and middle cerebral arteries patent without stenosis or large vessel occlusion. Posterior circulation: Both vertebral arteries patent to the basilar. Basilar widely patent. Posterior circulation patent without stenosis or large vessel occlusion. Venous sinuses: Normal venous enhancement Anatomic variants: None Review of the MIP images confirms the above findings IMPRESSION: 1. CT head negative for acute abnormality. 2. Negative for intracranial large vessel occlusion 3. 50% diameter stenosis proximal left internal carotid artery due to atherosclerotic calcification. Right vertebral artery widely patent 4. Fusiform dilatation of the internal  carotid artery below the skull base bilaterally possibly related to fibromuscular dysplasia. No dissection identified. 5. Both vertebral arteries widely patent. Electronically Signed   By: Franchot Gallo M.D.   On: 11/22/2020 15:10   Overnight EEG with video  Result Date: 11/22/2020 Lora Havens, MD     11/22/2020  2:35 PM Patient Name: OVA GILLENTINE MRN: [id number] Epilepsy Attending: Lora Havens Referring Physician/Provider: Dr Lesleigh Noe Duration: 11/21/2020 1841 to 11/22/2020 1024 Patient history:  75 yo F with new onset progressive confusion and seizures.  EEG to evaluate for seizures. Level of alertness: Awake, asleep AEDs during EEG study: None Technical aspects: This EEG study was done with scalp electrodes positioned according to the 10-20 International system of electrode placement. Electrical activity was acquired at a sampling rate of 500Hz  and reviewed with a high frequency filter of 70Hz  and a low frequency filter of 1Hz . EEG data were recorded continuously and digitally stored. Description: The posterior dominant rhythm consists of 9 Hz activity of moderate voltage (25-35 uV) seen predominantly in posterior head regions, symmetric and reactive to eye opening and eye closing. Sleep was characterized by vertex waves, sleep spindles (12 to 14 Hz), maximal frontocentral region.  Hyperventilation and photic stimulation were not performed.   IMPRESSION: This study is within normal limits. No seizures or epileptiform discharges were seen throughout the recording. Priyanka Hubert Azure, MD  [facility] 11/23/2020  If 7PM-7AM, please contact night-coverage

## 2020-11-23 NOTE — Care Management Important Message (Signed)
Important Message  Patient Details  Name: Elizabeth Crosby MRN: [id number] Date of Birth: 1946/01/23   Medicare Important Message Given:  Yes     Orbie Pyo 11/23/2020, 3:04 PM

## 2020-11-23 NOTE — Progress Notes (Signed)
Neurology Progress Note  S: Says her mind is clearer. She is sleepy this morning but says she hasn't slept well in 2 days because of nightly interruptions. NP reviewed medications and she did not receive sedative medications last pm. She is not distracted today. Her BP is 190s.   On attending evaluation she additionally reported that she was evaluated for obstructive sleep apnea many years ago and tried wearing a mask for a while but did not tolerate the mask that she had and therefore has not been using CPAP  O:BP remains high.  Current vital signs: BP (!) 193/56 (BP Location: Right Leg)   Pulse 87   Temp 99.4 F (37.4 C) (Oral)   Resp 17   Ht 5\' 1"  (1.549 m)   Wt 104.3 kg   SpO2 97%   BMI 43.46 kg/m  Vital signs in last 24 hours: Temp:  [98 F (36.7 C)-99.4 F (37.4 C)] 99.4 F (37.4 C) (08/25 0350) Pulse Rate:  [78-91] 87 (08/25 0350) Resp:  [17-21] 17 (08/25 0350) BP: (152-193)/(30-56) 193/56 (08/25 0350) SpO2:  [94 %-98 %] 97 % (08/25 0350)  GENERAL:  Chronically ill appearing female. Alert, but falls back to sleep easily. NAD. HEENT: Normocephalic and atraumatic. LUNGS: Normal respiratory effort.  CV: RRR. Ext: warm.  NEURO:  Mental Status: Easily aroused, but falls to sleep easily today. Oriented to name, age, place, day, month, year, but not date. She is not distracted today. Follows commands. Can say all months of year in order and makes it to August backwards before dozing off.  Speech/Language: speech is without aphasia or dysarthria.  Naming, repetition, fluency, and comprehension intact.  Cranial Nerves:  Face is symmetrical. Sensation is intact and symmetrical to face and all limbs. No extinction to DSS. Hearing intact to voice. BUE strength is 5/5. BLE strength-she can raise both legs off the bed, which is an improvement. Dorsi/plantar flexion 5/5. FNF intact. No ataxia.  Gait- deferred.  On attending examination,  General: Comfortable, no acute distress,  paradoxical breathing pattern while asleep concerning for sleep apnea, improves when she is awoken, perfusing extremities well, benign abdominal exam, mild peripheral edema of the feet. Neurologic: patient continues to be more alert and oriented, still somewhat tangential in her speech pattern but much clearer overall.  She has some slight left eye ptosis and saccadic pursuits but cranial nerves are otherwise normal and she attributes this to an oxygen mask hitting her eye the day before.  Motor exam demonstrates no pronator drift and moves her lower extremities equally.  Finger-nose is intact bilaterally.  Medications  Current Facility-Administered Medications:    acetaminophen (TYLENOL) tablet 650 mg, 650 mg, Oral, Q6H PRN, 650 mg at 11/22/20 0014 **OR** acetaminophen (TYLENOL) suppository 650 mg, 650 mg, Rectal, Q6H PRN, Alcario Drought, Jared M, DO   acyclovir (ZOVIRAX) 705 mg in dextrose 5 % 150 mL IVPB, 10 mg/kg (Adjusted), Intravenous, Q12H, Alcario Drought, Jared M, DO, Last Rate: 164.1 mL/hr at 11/22/20 2215, 705 mg at 11/22/20 2215   dextrose 5 %-0.9 % sodium chloride infusion, , Intravenous, Continuous, Reubin Milan, MD, Last Rate: 125 mL/hr at 11/21/20 0505, New Bag at 11/21/20 0505   enoxaparin (LOVENOX) injection 40 mg, 40 mg, Subcutaneous, Q24H, Alcario Drought, Jared M, DO, 40 mg at 11/22/20 1002   insulin aspart (novoLOG) injection 0-9 Units, 0-9 Units, Subcutaneous, Q4H, Gardner, Jared M, DO, 1 Units at 11/22/20 1636   insulin glargine-yfgn (SEMGLEE) injection 20 Units, 20 Units, Subcutaneous, Daily, Etta Quill, DO,  20 Units at 11/22/20 0959   ondansetron (ZOFRAN) tablet 4 mg, 4 mg, Oral, Q6H PRN **OR** ondansetron (ZOFRAN) injection 4 mg, 4 mg, Intravenous, Q6H PRN, Alcario Drought, Jared M, DO   [COMPLETED] thiamine 500mg  in normal saline (43ml) IVPB, 500 mg, Intravenous, Once, Stopped at 11/20/20 1425 **FOLLOWED BY** thiamine (B-1) injection 100 mg, 100 mg, Intravenous, Q24H, Caesar Mannella L, MD,  100 mg at 11/22/20 1355   vitamin B-12 (CYANOCOBALAMIN) tablet 500 mcg, 500 mcg, Oral, Daily, Isamu Trammel L, MD  Pertinent Labs TSH  1.288   Vit B12 190. MMA high normal at 335. RPR negative.  HSV PCR negative. LP culture and gram stain pending.     Imaging:   CTH/CTA head and neck, personally reviewed by attending MD:  -CT head negative for acute abnormality. -Negative for intracranial large vessel occlusion -50% diameter stenosis proximal left internal carotid artery due to atherosclerotic calcification. Right vertebral artery widely patent -Fusiform dilatation of the internal carotid artery below the skull base bilaterally possibly related to fibromuscular dysplasia. No dissection identified. -Both vertebral arteries widely patent.   EEG 11/22/20: This study is within normal limits. No seizures or epileptiform discharges were seen throughout the recording.   Assessment: 75 yo female who presented with confusion and possible seizure event. Etiology initially unclear, so empiric acyclovir was started for meningitis coverage. Still awaiting culture and gram stain on CSF, however studies are not consistent with bacterial infection. HSV is negative, so will d/c Acyclovir. Doing well off Keppra, no seizure activity noted and overnight EEG negative for seizure activity with Keppra discontinued (first routine EEG was obtained while on Keppra). CTA head and neck without stenosis or LVO, which effectively rules out vertebrobasilar insufficiency as an etiology.  Impression: -encephalopathy, improved, suspect most likely secondary to hypoxia/pneumonia on admission -Negative EEGs.  -HTN, uncontrolled.  -OSA not on CPAP  Recommendations/Plan:  -Given CTA is without LVO, significant carotid stenosis, or posterior circulation stenosis, her long-term BP goal is 628-366 systolic. Gradual decrease of BP, medications to be titrated by primary team.  -suggest out patient sleep study. She has been  non adherent in the past. With more comfortable equipment available, perhaps she can find tolerable equipment.  Attending had extensive discussion with patient on the risks of untreated obstructive sleep apnea -d/c Acyclovir.  -Avoid sedatives and medications on the [place]'s list for the elderly.  -Okay to continue B12 500 mcg daily and thiamine 100 mg daily p.o. -No further inpatient neurological work-up needed at this time -Neurology will be available for questions on an as-needed basis, please reach out if new questions or concerns arise about this patient  Pt seen by Clance Boll, MSN, APN-BC/Nurse Practitioner/Neuro and later by MD. Note and plan to be edited as needed by MD.  Pager: [phone]   Attending Neurologist's note:  I personally saw this patient, gathering history, performing an examination, reviewing relevant labs, personally reviewing relevant imaging including CTA head and neck, and formulated the assessment and plan, adding the note above for completeness and clarity to accurately reflect my thoughts

## 2020-11-23 NOTE — TOC Progression Note (Signed)
Transition of Care [facility]) - Progression Note    Patient Details  Name: Elizabeth Crosby MRN: [id number] Date of Birth: 07-31-1945  [facility]) CM/SW Contact  Joanne Chars, LCSW Phone Number: 11/23/2020, 2:57 PM  Clinical Narrative:   CSW presented bed offers to pt and husband in room, reviewed the medicare.gov sheets with husband as well.  Husband saying he wants [facility] due to proximity to home. CSW called daughter [name] and provided bed offers to her as well.  She will discuss with them.      Expected Discharge Plan: [place] Barriers to Discharge: Continued Medical Work up, SNF Pending bed offer  Expected Discharge Plan and Services Expected Discharge Plan: [place] Choice: [place] arrangements for the past 2 months: Single Family Home                                       Social Determinants of Health (SDOH) Interventions    Readmission Risk Interventions No flowsheet data found.

## 2020-11-23 NOTE — NC FL2 (Signed)
[place] LEVEL OF CARE SCREENING TOOL     IDENTIFICATION  Patient Name: Elizabeth Crosby Birthdate: 1945/11/19 Sex: female Admission Date (Current Location): 11/18/2020  [facility] and [place] Number:  Herbalist and Address:  [facility], [place] [address], [place], [place] [address]      Provider Number: [id number]  Attending Physician Name and Address:  Flora Lipps, MD  Relative Name and Phone Number:  Harrison,Janet Daughter   [phone]    Current Level of Care: Hospital Recommended Level of Care: [place] Prior Approval Number:    Date Approved/Denied:   PASRR Number: [id number]  Discharge Plan: SNF    Current Diagnoses: Patient Active Problem List   Diagnosis Date Noted   Seizure ([place]) 11/18/2020   Acute encephalopathy 11/18/2020   DM2 (diabetes mellitus, type 2) ([place]) 11/18/2020   HTN (hypertension) 11/18/2020   Left lower lobe pneumonia 11/18/2020   History of heart valve abnormality 10/24/2017   Postmenopausal bleeding 09/30/2017   Pelvic mass 09/30/2017   Morbid obesity ([place]) 09/30/2017   Endometrial cancer ([place]) 09/30/2017   Pelvic mass in female 09/30/2017   Acute rhinitis 07/04/2013   Bronchitis, subacute 05/29/2013   GERD (gastroesophageal reflux disease) 05/29/2013    Orientation RESPIRATION BLADDER Height & Weight     Self, Place  Normal External catheter Weight: 230 lb (104.3 kg) Height:  5\' 1"  (154.9 cm)  BEHAVIORAL SYMPTOMS/MOOD NEUROLOGICAL BOWEL NUTRITION STATUS    Convulsions/Seizures Continent Diet (see discharge summary)  AMBULATORY STATUS COMMUNICATION OF NEEDS Skin   Total Care Verbally Normal                       Personal Care Assistance Level of Assistance  Bathing, Feeding, Dressing Bathing Assistance: Maximum assistance Feeding assistance: Independent Dressing Assistance: Maximum assistance     Functional Limitations Info  Sight, Hearing, Speech Sight  Info: Adequate Hearing Info: Adequate Speech Info: Adequate    SPECIAL CARE FACTORS FREQUENCY  PT (By licensed PT), OT (By licensed OT)     PT Frequency: 5x week OT Frequency: 5x week            Contractures Contractures Info: Not present    Additional Factors Info  Code Status, Allergies Code Status Info: full Allergies Info: Ozempic (0.25 Or 0.5 Mg-dose) (Semaglutide(0.25 Or 0.5mg -dos)), Amoxicillin, Canagliflozin, Dulaglutide, Lipitor (Atorvastatin), Metformin, Other, Pregabalin Er, Sulfa Antibiotics, Potassium-containing Compounds           Current Medications (11/23/2020):  This is the current hospital active medication list Current Facility-Administered Medications  Medication Dose Route Frequency Provider Last Rate Last Admin   acetaminophen (TYLENOL) tablet 650 mg  650 mg Oral Q6H PRN Etta Quill, DO   650 mg at 11/22/20 0014   Or   acetaminophen (TYLENOL) suppository 650 mg  650 mg Rectal Q6H PRN Etta Quill, DO       enoxaparin (LOVENOX) injection 40 mg  40 mg Subcutaneous Q24H Alcario Drought, Jared M, DO   40 mg at 11/23/20 4097   hydrALAZINE (APRESOLINE) injection 10 mg  10 mg Intravenous Q6H PRN Pokhrel, Laxman, MD       irbesartan (AVAPRO) tablet 300 mg  300 mg Oral Daily Rozann Lesches, RPH       And   hydrochlorothiazide (HYDRODIURIL) tablet 25 mg  25 mg Oral Daily Rozann Lesches, RPH       insulin aspart (novoLOG) injection 0-9 Units  0-9 Units  Subcutaneous Q4H Etta Quill, DO   1 Units at 11/22/20 1636   insulin glargine-yfgn [facility]) injection 20 Units  20 Units Subcutaneous Daily Etta Quill, DO   20 Units at 11/23/20 0093   magnesium sulfate IVPB 2 g 50 mL  2 g Intravenous Once Pokhrel, Laxman, MD       ondansetron (ZOFRAN) tablet 4 mg  4 mg Oral Q6H PRN Etta Quill, DO       Or   ondansetron [facility]) injection 4 mg  4 mg Intravenous Q6H PRN Alcario Drought, Jared M, DO       pantoprazole (PROTONIX) EC tablet 40 mg  40 mg Oral Daily Pokhrel,  Laxman, MD       phosphorus (K PHOS NEUTRAL) tablet 500 mg  500 mg Oral TID Pokhrel, Laxman, MD       thiamine tablet 100 mg  100 mg Oral Daily Bhagat, Srishti L, MD       vitamin B-12 (CYANOCOBALAMIN) tablet 500 mcg  500 mcg Oral Daily Bhagat, Srishti L, MD   500 mcg at 11/23/20 8182     Discharge Medications: Please see discharge summary for a list of discharge medications.  Relevant Imaging Results:  Relevant Lab Results:   Additional Information SSN: [id number]. Pt is vaccinated for covid with one booster.  Joanne Chars, LCSW

## 2020-11-24 DIAGNOSIS — I10 Essential (primary) hypertension: Secondary | ICD-10-CM | POA: Diagnosis not present

## 2020-11-24 DIAGNOSIS — E876 Hypokalemia: Secondary | ICD-10-CM

## 2020-11-24 DIAGNOSIS — E119 Type 2 diabetes mellitus without complications: Secondary | ICD-10-CM | POA: Diagnosis not present

## 2020-11-24 DIAGNOSIS — J189 Pneumonia, unspecified organism: Secondary | ICD-10-CM | POA: Diagnosis not present

## 2020-11-24 DIAGNOSIS — Z794 Long term (current) use of insulin: Secondary | ICD-10-CM | POA: Diagnosis not present

## 2020-11-24 LAB — CBC
HCT: 31.2 % — ABNORMAL LOW (ref 36.0–46.0)
Hemoglobin: 10.7 g/dL — ABNORMAL LOW (ref 12.0–15.0)
MCH: 31.1 pg (ref 26.0–34.0)
MCHC: 34.3 g/dL (ref 30.0–36.0)
MCV: 90.7 fL (ref 80.0–100.0)
Platelets: 140 10*3/uL — ABNORMAL LOW (ref 150–400)
RBC: 3.44 MIL/uL — ABNORMAL LOW (ref 3.87–5.11)
RDW: 14.3 % (ref 11.5–15.5)
WBC: 8.6 10*3/uL (ref 4.0–10.5)
nRBC: 0 % (ref 0.0–0.2)

## 2020-11-24 LAB — BASIC METABOLIC PANEL
Anion gap: 10 (ref 5–15)
BUN: 20 mg/dL (ref 8–23)
CO2: 23 mmol/L (ref 22–32)
Calcium: 8 mg/dL — ABNORMAL LOW (ref 8.9–10.3)
Chloride: 109 mmol/L (ref 98–111)
Creatinine, Ser: 1.13 mg/dL — ABNORMAL HIGH (ref 0.44–1.00)
GFR, Estimated: 51 mL/min — ABNORMAL LOW (ref >60)
Glucose, Bld: 61 mg/dL — ABNORMAL LOW (ref 70–99)
Potassium: 3.3 mmol/L — ABNORMAL LOW (ref 3.5–5.1)
Sodium: 142 mmol/L (ref 135–145)

## 2020-11-24 LAB — GLUCOSE, CAPILLARY
Glucose-Capillary: 103 mg/dL — ABNORMAL HIGH (ref 70–99)
Glucose-Capillary: 105 mg/dL — ABNORMAL HIGH (ref 70–99)
Glucose-Capillary: 135 mg/dL — ABNORMAL HIGH (ref 70–99)
Glucose-Capillary: 146 mg/dL — ABNORMAL HIGH (ref 70–99)
Glucose-Capillary: 177 mg/dL — ABNORMAL HIGH (ref 70–99)
Glucose-Capillary: 182 mg/dL — ABNORMAL HIGH (ref 70–99)
Glucose-Capillary: 56 mg/dL — ABNORMAL LOW (ref 70–99)
Glucose-Capillary: 91 mg/dL (ref 70–99)

## 2020-11-24 LAB — MAGNESIUM: Magnesium: 1.8 mg/dL (ref 1.7–2.4)

## 2020-11-24 MED ORDER — MAGNESIUM SULFATE 2 GM/50ML IV SOLN
2.0000 g | Freq: Once | INTRAVENOUS | Status: AC
  Administered 2020-11-24: 2 g via INTRAVENOUS
  Filled 2020-11-24: qty 50

## 2020-11-24 MED ORDER — INSULIN GLARGINE-YFGN 100 UNIT/ML ~~LOC~~ SOLN
10.0000 [IU] | Freq: Every day | SUBCUTANEOUS | Status: DC
  Administered 2020-11-25: 10 [IU] via SUBCUTANEOUS
  Filled 2020-11-24 (×2): qty 0.1

## 2020-11-24 MED ORDER — INSULIN ASPART 100 UNIT/ML IJ SOLN
0.0000 [IU] | Freq: Three times a day (TID) | INTRAMUSCULAR | Status: DC
  Administered 2020-11-24: 1 [IU] via SUBCUTANEOUS
  Administered 2020-11-24 – 2020-11-25 (×3): 2 [IU] via SUBCUTANEOUS

## 2020-11-24 NOTE — Progress Notes (Signed)
Hypoglycemic Event  CBG: 56  Treatment: 8 oz juice/soda  Symptoms: None  Follow-up CBG: [id number] CBG Result:105  Possible Reasons for Event: Unknown  Comments/MD notified:na    Elizabeth Crosby

## 2020-11-24 NOTE — Progress Notes (Addendum)
PROGRESS NOTE  [name] [id number] DOB: 1945-11-09 DOA: 11/18/2020 PCP: [name], MD   LOS: 5 days   Brief narrative:  Elizabeth Crosby is a 75 y.o.  female with medical history significant of endometrial CA s/p radiation, diabetes mellitus type 2, hypertension, obstructive sleep apnea presented to the hospital with secondary to altered mental status and seizure witnessed during EMS transportation.  Patient was found to have a LLL pneumonia and was started on antibiotics. Neurology was consulted consulted with recommendation for LP studies and EEG.  During hospitalization, patient has improved mentation.  No seizure-like activity was noted.  Patient's encephalopathy was thought to be secondary to pneumonia.  Assessment/Plan:  Principal Problem:   Acute encephalopathy Active Problems:   DM2 (diabetes mellitus, type 2) (HCC)   HTN (hypertension)   Left lower lobe pneumonia   Acute metabolic encephalopathy  Resolved at this time.  Likely secondary to pneumonia.  MRI without any acute process.  Neurology was consulted and patient underwent lumbar puncture on 8/22 not consistent with infection.  Patient was initially empirically on acyclovir which has been discontinued at this time.  Keppra has been discontinued as per neurology..  Patient underwent long-term EEG monitoring as well without features of seizure disorder..  Neurology okay with vitamin B12 and thiamine supplementation.  Seizures ruled out.  Avoid sedative medications as possible.   Left lower lobe pneumonia Continue Ceftriaxone IV and azithromycin IV.  Blood cultures negative in 3 days.  Might benefit from assessing for sleep apnea as outpatient. On room air.  Plan to continue 5-day course of antibiotic  Diabetes mellitus, type 2 Continue sliding scale insulin and long-acting insulin.  Currently on Semglee 20 units.  Changed to 10 units.  Patient is on Tresiba 40 units at home.  Will change sliding scale to [facility] at  bedtime since that she had some hypoglycemia.  Hypomagnesemia.  Improved after replacement.  Hypophosphatemia.  On K-Phos.  Check levels in a.m.  Essential hypertension Continue telmesartan, hydrochlorothiazide at home.    Blood pressure goal 1 09-326 systolic.  Will benefit from sleep study as outpatient.  Hypokalemia.  Continue to replenish.  Mildly low at 3.3 today  Debility, deconditioning.  PT recommended skilled nursing facility placement.  DVT prophylaxis: enoxaparin (LOVENOX) injection 40 mg Start: 11/19/20 1000  Code Status: Full code  Family Communication:  None today.  I spoke with the patient's daughter [name] on the phone and updated her about the clinical condition of the patient yesterday..   Status is: Inpatient  Remains inpatient appropriate because:IV treatments appropriate due to intensity of illness or inability to take PO, Inpatient level of care appropriate due to severity of illness, and need for rehabilitation  Dispo: The patient is from: Home              Anticipated d/c is to: SNF likely tomorrow              Patient currently is medically stable to d/c.   Difficult to place patient No  Consultants: Neurology  Procedures: EEG  Anti-infectives: Will dc acyclovir.  Subjective: Today, patient was seen and examined at bedside.  States that she had some difficulty with the sleeping and had low blood glucose levels.  Objective: Vitals:   11/24/20 0733 11/24/20 1126  BP: (!) 154/52 (!) 145/94  Pulse: 76 76  Resp: 20 (!) 21  Temp: 98.2 F (36.8 C)   SpO2: 97% 98%    Intake/Output Summary (Last 24 hours) at  11/24/2020 1409 Last data filed at 11/23/2020 1833 Gross per 24 hour  Intake 240 ml  Output --  Net 240 ml    Filed Weights   11/18/20 2330  Weight: 104.3 kg   Body mass index is 43.46 kg/m.   Physical Exam:  GENERAL: Patient is alert awake and oriented. Not in obvious distress.  Morbidly obese HENT: No scleral pallor or icterus.  Pupils equally reactive to light. Oral mucosa is moist NECK: is supple, no gross swelling noted. CHEST: Clear to auscultation. No crackles or wheezes.  Diminished breath sounds bilaterally. CVS: S1 and S2 heard, no murmur. Regular rate and rhythm.  ABDOMEN: Soft, non-tender, bowel sounds are present. EXTREMITIES: No edema.   CNS: Nonfocal SKIN: warm and dry without rashes.  Data Review: I have personally reviewed the following laboratory data and studies,  CBC: Recent Labs  Lab 11/18/20 1656 11/18/20 1709 11/19/20 0500 11/20/20 0449 11/22/20 0308 11/23/20 0237 11/24/20 0330  WBC 6.1  --  13.5* 11.1* 9.0 9.5 8.6  NEUTROABS 3.2  --   --   --   --   --   --   HGB 12.0   < > 11.4* 12.1 10.6* 10.7* 10.7*  HCT 36.9   < > 35.9* 36.8 32.2* 32.2* 31.2*  MCV 94.1  --  96.5 92.5 91.2 91.2 90.7  PLT 135*  --  116* 130* 137* 132* 140*   < > = values in this interval not displayed.    Basic Metabolic Panel: Recent Labs  Lab 11/20/20 0449 11/21/20 1406 11/22/20 0308 11/23/20 0237 11/24/20 0330  NA 141 141 142 143 142  K 3.3* 3.3* 3.4* 3.7 3.3*  CL 112* 114* 113* 113* 109  CO2 21* 22 23 24 23   GLUCOSE 135* 198* 124* 92 61*  BUN 21 16 18 20 20   CREATININE 1.26* 1.25* 1.14* 1.21* 1.13*  CALCIUM 7.9* 7.6* 7.6* 7.9* 8.0*  MG  --   --   --  1.1* 1.8  PHOS  --   --   --  1.7*  --     Liver Function Tests: Recent Labs  Lab 11/18/20 1656 11/19/20 0500 11/20/20 0449  AST 20 21 29   ALT 12 11 14   ALKPHOS 86 67 69  BILITOT 1.2 1.8* 1.2  PROT 5.3* 4.6* 4.8*  ALBUMIN 2.9* 2.4* 2.4*    No results for input(s): LIPASE, AMYLASE in the last 168 hours. Recent Labs  Lab 11/21/20 0830  AMMONIA 48*    Cardiac Enzymes: No results for input(s): CKTOTAL, CKMB, CKMBINDEX, TROPONINI in the last 168 hours. BNP (last 3 results) No results for input(s): BNP in the last 8760 hours.  ProBNP (last 3 results) No results for input(s): PROBNP in the last 8760 hours.  CBG: Recent Labs   Lab 11/23/20 2348 11/24/20 0354 11/24/20 0518 11/24/20 0733 11/24/20 1125  GLUCAP 91 56* 105* 103* 135*    Recent Results (from the past 240 hour(s))  Resp Panel by RT-PCR (Flu A&B, Covid) Nasopharyngeal Swab     Status: None   Collection Time: 11/18/20  4:59 PM   Specimen: Nasopharyngeal Swab; Nasopharyngeal(NP) swabs in vial transport medium  Result Value Ref Range Status   SARS Coronavirus 2 by RT PCR NEGATIVE NEGATIVE Final   Influenza A by PCR NEGATIVE NEGATIVE Final   Influenza B by PCR NEGATIVE NEGATIVE Final    Comment: (NOTE) The Xpert Xpress SARS-CoV-2/FLU/RSV plus assay is intended as an aid in the diagnosis of influenza from Nasopharyngeal  swab specimens and should not be used as a sole basis for treatment. Nasal washings and aspirates are unacceptable for Xpert Xpress SARS-CoV-2/FLU/RSV testing.  Fact Sheet for Patients: EntrepreneurPulse.com.au  Fact Sheet for Healthcare Providers: IncredibleEmployment.be  This test is not yet approved or cleared by the [place] FDA and has been authorized for detection and/or diagnosis of SARS-CoV-2 by FDA under an Emergency Use Authorization (EUA). This EUA will remain in effect (meaning this test can be used) for the duration of the COVID-19 declaration under Section 564(b)(1) of the Act, 21 U.S.C. section 360bbb-3(b)(1), unless the authorization is terminated or revoked.  Performed at [facility], [place] [address] [place], [place] [address]   Urine Culture     Status: Abnormal   Collection Time: 11/19/20  9:52 AM   Specimen: Urine, Clean Catch  Result Value Ref Range Status   Specimen Description URINE, CLEAN CATCH  Final   Special Requests   Final    NONE Performed at [facility], [place] [address] [place], [place] [address]    Culture MULTIPLE SPECIES PRESENT, SUGGEST RECOLLECTION (A)  Final   Report Status 11/20/2020 FINAL  Final  MRSA Next Gen by PCR, Nasal      Status: None   Collection Time: 11/19/20  4:23 PM   Specimen: Nasal Mucosa; Nasal Swab  Result Value Ref Range Status   MRSA by PCR Next Gen NOT DETECTED NOT DETECTED Final    Comment: (NOTE) The GeneXpert MRSA Assay (FDA approved for NASAL specimens only), is one component of a comprehensive MRSA colonization surveillance program. It is not intended to diagnose MRSA infection nor to guide or monitor treatment for MRSA infections. Test performance is not FDA approved in patients less than 82 years old. Performed at [facility], [place] [address] [place], [place] [address]   Culture, blood (routine x 2)     Status: None (Preliminary result)   Collection Time: 11/20/20  2:48 PM   Specimen: BLOOD RIGHT HAND  Result Value Ref Range Status   Specimen Description BLOOD RIGHT HAND  Final   Special Requests   Final    BOTTLES DRAWN AEROBIC AND ANAEROBIC Blood Culture results may not be optimal due to an inadequate volume of blood received in culture bottles   Culture   Final    NO GROWTH 4 DAYS Performed at [facility], [place] [address], [place], [place] [address]    Report Status PENDING  Incomplete  Culture, blood (routine x 2)     Status: None (Preliminary result)   Collection Time: 11/20/20 10:09 PM   Specimen: BLOOD  Result Value Ref Range Status   Specimen Description BLOOD LEFT ARM  Final   Special Requests   Final    BOTTLES DRAWN AEROBIC AND ANAEROBIC Blood Culture adequate volume   Culture   Final    NO GROWTH 4 DAYS Performed at [facility], [place] [address], [place], [place] [address]    Report Status PENDING  Incomplete      Studies: No results found.    Flora Lipps, MD  [facility] 11/24/2020  If 7PM-7AM, please contact night-coverage

## 2020-11-24 NOTE — TOC Progression Note (Addendum)
Transition of Care [facility]) - Progression Note    Patient Details  Name: Elizabeth Crosby MRN: [id number] Date of Birth: 08/24/1945  [facility] CM/SW Contact  Joanne Chars, LCSW Phone Number: 11/24/2020, 2:07 PM  Clinical Narrative:   CSW  confirmed with pt, husband, and daughter that their choice is [place].  CSW spoke with Lenna Sciara at [facility] who confirmed they do have bed for pt and can take her for admission tomorrow (Saturday)  Melissa asked that [facility] call her cell tomorrow to arrange: [phone].    1430: Auth request made in [place].    Expected Discharge Plan: [place] Barriers to Discharge: Continued Medical Work up, SNF Pending bed offer  Expected Discharge Plan and Services Expected Discharge Plan: [place] Choice: [place] arrangements for the past 2 months: Single Family Home                                       Social Determinants of Health (SDOH) Interventions    Readmission Risk Interventions No flowsheet data found.

## 2020-11-25 DIAGNOSIS — Z79899 Other long term (current) drug therapy: Secondary | ICD-10-CM | POA: Diagnosis not present

## 2020-11-25 DIAGNOSIS — R569 Unspecified convulsions: Secondary | ICD-10-CM | POA: Diagnosis not present

## 2020-11-25 DIAGNOSIS — E876 Hypokalemia: Secondary | ICD-10-CM | POA: Diagnosis not present

## 2020-11-25 DIAGNOSIS — Z7401 Bed confinement status: Secondary | ICD-10-CM | POA: Diagnosis not present

## 2020-11-25 DIAGNOSIS — Z794 Long term (current) use of insulin: Secondary | ICD-10-CM | POA: Diagnosis not present

## 2020-11-25 DIAGNOSIS — I1310 Hypertensive heart and chronic kidney disease without heart failure, with stage 1 through stage 4 chronic kidney disease, or unspecified chronic kidney disease: Secondary | ICD-10-CM | POA: Diagnosis not present

## 2020-11-25 DIAGNOSIS — M179 Osteoarthritis of knee, unspecified: Secondary | ICD-10-CM | POA: Diagnosis not present

## 2020-11-25 DIAGNOSIS — M6281 Muscle weakness (generalized): Secondary | ICD-10-CM | POA: Diagnosis not present

## 2020-11-25 DIAGNOSIS — N189 Chronic kidney disease, unspecified: Secondary | ICD-10-CM | POA: Diagnosis not present

## 2020-11-25 DIAGNOSIS — R52 Pain, unspecified: Secondary | ICD-10-CM | POA: Diagnosis not present

## 2020-11-25 DIAGNOSIS — E8809 Other disorders of plasma-protein metabolism, not elsewhere classified: Secondary | ICD-10-CM | POA: Diagnosis not present

## 2020-11-25 DIAGNOSIS — E559 Vitamin D deficiency, unspecified: Secondary | ICD-10-CM | POA: Diagnosis not present

## 2020-11-25 DIAGNOSIS — D518 Other vitamin B12 deficiency anemias: Secondary | ICD-10-CM | POA: Diagnosis not present

## 2020-11-25 DIAGNOSIS — Z743 Need for continuous supervision: Secondary | ICD-10-CM | POA: Diagnosis not present

## 2020-11-25 DIAGNOSIS — E46 Unspecified protein-calorie malnutrition: Secondary | ICD-10-CM | POA: Diagnosis not present

## 2020-11-25 DIAGNOSIS — R5381 Other malaise: Secondary | ICD-10-CM | POA: Diagnosis not present

## 2020-11-25 DIAGNOSIS — E119 Type 2 diabetes mellitus without complications: Secondary | ICD-10-CM | POA: Diagnosis not present

## 2020-11-25 DIAGNOSIS — K219 Gastro-esophageal reflux disease without esophagitis: Secondary | ICD-10-CM | POA: Diagnosis not present

## 2020-11-25 DIAGNOSIS — M170 Bilateral primary osteoarthritis of knee: Secondary | ICD-10-CM | POA: Diagnosis not present

## 2020-11-25 DIAGNOSIS — N183 Chronic kidney disease, stage 3 unspecified: Secondary | ICD-10-CM | POA: Diagnosis not present

## 2020-11-25 DIAGNOSIS — D649 Anemia, unspecified: Secondary | ICD-10-CM | POA: Diagnosis not present

## 2020-11-25 DIAGNOSIS — I10 Essential (primary) hypertension: Secondary | ICD-10-CM | POA: Diagnosis not present

## 2020-11-25 DIAGNOSIS — R6889 Other general symptoms and signs: Secondary | ICD-10-CM | POA: Diagnosis not present

## 2020-11-25 DIAGNOSIS — G4700 Insomnia, unspecified: Secondary | ICD-10-CM | POA: Diagnosis not present

## 2020-11-25 DIAGNOSIS — J189 Pneumonia, unspecified organism: Secondary | ICD-10-CM | POA: Diagnosis not present

## 2020-11-25 DIAGNOSIS — E039 Hypothyroidism, unspecified: Secondary | ICD-10-CM | POA: Diagnosis not present

## 2020-11-25 DIAGNOSIS — G9341 Metabolic encephalopathy: Secondary | ICD-10-CM | POA: Diagnosis not present

## 2020-11-25 DIAGNOSIS — E785 Hyperlipidemia, unspecified: Secondary | ICD-10-CM | POA: Diagnosis not present

## 2020-11-25 LAB — RESP PANEL BY RT-PCR (FLU A&B, COVID) ARPGX2
Influenza A by PCR: NEGATIVE
Influenza B by PCR: NEGATIVE
SARS Coronavirus 2 by RT PCR: NEGATIVE

## 2020-11-25 LAB — CULTURE, BLOOD (ROUTINE X 2)
Culture: NO GROWTH
Culture: NO GROWTH
Special Requests: ADEQUATE

## 2020-11-25 LAB — GLUCOSE, CAPILLARY
Glucose-Capillary: 129 mg/dL — ABNORMAL HIGH (ref 70–99)
Glucose-Capillary: 175 mg/dL — ABNORMAL HIGH (ref 70–99)
Glucose-Capillary: 165 mg/dL — ABNORMAL HIGH (ref 70–99)
Glucose-Capillary: 176 mg/dL — ABNORMAL HIGH (ref 70–99)
Glucose-Capillary: 157 mg/dL — ABNORMAL HIGH (ref 70–99)

## 2020-11-25 MED ORDER — TRAMADOL HCL 50 MG PO TABS: 50.0000 mg | ORAL_TABLET | Freq: Two times a day (BID) | ORAL | 0 refills | Status: DC | PRN

## 2020-11-25 MED ORDER — DICLOFENAC SODIUM 1 % EX GEL: 2.0000 g | Freq: Four times a day (QID) | CUTANEOUS | Status: AC

## 2020-11-25 MED ORDER — TRAMADOL HCL 50 MG PO TABS
50.0000 mg | ORAL_TABLET | Freq: Once | ORAL | Status: AC
  Administered 2020-11-25: 50 mg via ORAL
  Filled 2020-11-25: qty 1

## 2020-11-25 MED ORDER — CYANOCOBALAMIN 500 MCG PO TABS: 500.0000 ug | ORAL_TABLET | Freq: Every day | ORAL | Status: AC

## 2020-11-25 MED ORDER — THIAMINE HCL 100 MG PO TABS: 100.0000 mg | ORAL_TABLET | Freq: Every day | ORAL | Status: AC

## 2020-11-25 MED ORDER — ACETAMINOPHEN 500 MG PO TABS: 500.0000 mg | ORAL_TABLET | ORAL | Status: DC | PRN

## 2020-11-25 NOTE — Progress Notes (Signed)
[facility] arrived to take pt to rehab facility. IV removed from (L) arm with catheter intact. Pt's vs stable and pt is alert and oriented. Notified pt's husband of dicharge.

## 2020-11-25 NOTE — Discharge Summary (Signed)
Physician Discharge Summary  Elizabeth Crosby ZOX:[id number] DOB: 1945-06-08 DOA: 11/18/2020  PCP: [name], MD  Admit date: 11/18/2020 Discharge date: 11/25/2020  Admitted From: Home  Discharge disposition: SNF  Recommendations for Outpatient Follow-Up:   Follow up with your primary care provider at the skilled nursing facility in 3 to 5 days. Patient will likely benefit from assessment for sleep apnea as outpatient Check CBC, BMP, magnesium in the next visit  Discharge Diagnosis:   Principal Problem:   Acute encephalopathy Active Problems:   DM2 (diabetes mellitus, type 2) ([place])   HTN (hypertension)   Left lower lobe pneumonia  Discharge Condition: Improved.  Diet recommendation: Low sodium, heart healthy.  Carbohydrate-modified.    Wound care: None.  Code status: Full.   History of Present Illness:   Elizabeth Crosby is a 75 y.o.  female with medical history significant of endometrial CA s/p radiation, diabetes mellitus type 2, hypertension, obstructive sleep apnea presented to the hospital with secondary to altered mental status and seizure witnessed during EMS transportation.  Patient was found to have a LLL pneumonia and was started on antibiotics. Neurology was consulted consulted with recommendation for LP studies and EEG.  Hospital Course:   Following conditions were addressed during hospitalization as listed below,  Acute metabolic encephalopathy  Resolved at this time.  Likely secondary to pneumonia.  MRI without any acute process.  Neurology was consulted and patient underwent lumbar puncture on 11/20/20 not consistent with infection.  Patient underwent long-term EEG monitoring as well without features of seizure disorder..  Neurology okay with vitamin B12 and thiamine supplementation.  Seizures ruled out.    Left lower lobe pneumonia Completed course of Rocephin and Zithromax while in hospital.  Blood cultures were negative in 5 days.  Influenza and COVID  was negative.   Diabetes mellitus, type 2 Continue sliding scale insulin and long-acting insulin.  Currently on Semglee 20 units.  Changed to 10 units.  Patient is on Tresiba 40 units at home.  Will change sliding scale to [facility] at bedtime since that she had some hypoglycemia.   Hypomagnesemia.  Improved after replacement.   Hypophosphatemia, hypokalemia.  Improved after replacement.   Essential hypertension Continue telmesartan, hydrochlorothiazide at home.    Blood pressure goal 811-914 systolic.  Will benefit from sleep study as outpatient.  Debility, deconditioning.  PT recommended skilled nursing facility placement.  Disposition.  At this time, patient is stable for disposition to skilled nursing facility  Medical Consultants:   Neurology  Procedures:    Lumbar puncture EEG Subjective:   Today, patient was seen and examined at bedside.  Feels okay.  Denies any headache, dizziness, lightheadedness or shortness of breath.  Discharge Exam:   Vitals:   11/25/20 0811 11/25/20 1127  BP: (!) 152/44 (!) 118/43  Pulse: 75 72  Resp: 15 16  Temp: 97.6 F (36.4 C) 98.4 F (36.9 C)  SpO2:  96%   Vitals:   11/24/20 2354 11/25/20 0357 11/25/20 0811 11/25/20 1127  BP: 136/66 (!) 132/52 (!) 152/44 (!) 118/43  Pulse: 77 77 75 72  Resp: 16 20 15 16   Temp: 99.7 F (37.6 C) 98.8 F (37.1 C) 97.6 F (36.4 C) 98.4 F (36.9 C)  TempSrc: Oral Oral Oral Oral  SpO2:    96%  Weight:      Height:        General: Alert awake, not in obvious distress HENT: pupils equally reacting to light,  No scleral pallor or icterus  noted. Oral mucosa is moist.  Chest:  Clear breath sounds.  Diminished breath sounds bilaterally. No crackles or wheezes.  CVS: S1 &S2 heard. No murmur.  Regular rate and rhythm. Abdomen: Soft, nontender, nondistended.  Bowel sounds are heard.   Extremities: No cyanosis, clubbing or edema.  Peripheral pulses are palpable. Psych: Alert, awake and oriented, normal  mood CNS:  No cranial nerve deficits.  Power equal in all extremities.   Skin: Warm and dry.  No rashes noted.  The results of significant diagnostics from this hospitalization (including imaging, microbiology, ancillary and laboratory) are listed below for reference.     Diagnostic Studies:   DG Chest 2 View  Result Date: 11/18/2020 CLINICAL DATA:  Altered level of consciousness EXAM: CHEST - 2 VIEW COMPARISON:  11/18/2020 at 6:39 p.m. FINDINGS: Frontal and lateral views of the chest demonstrate a stable cardiac silhouette. Left basilar airspace disease is identified, consistent with asymmetric edema, aspiration, or infection. No effusion or pneumothorax. No acute bony abnormalities. IMPRESSION: 1. Left basilar airspace disease, favor aspiration or infection. Electronically Signed   By: Randa Ngo M.D.   On: 11/18/2020 22:51   MR BRAIN W WO CONTRAST  Result Date: 11/19/2020 CLINICAL DATA:  Encephalopathy EXAM: MRI HEAD WITHOUT AND WITH CONTRAST TECHNIQUE: Multiplanar, multiecho pulse sequences of the brain and surrounding structures were obtained without and with intravenous contrast. CONTRAST:  42mL GADAVIST GADOBUTROL 1 MMOL/ML IV SOLN COMPARISON:  None. FINDINGS: Brain: No acute infarct, mass effect or extra-axial collection. No acute or chronic hemorrhage. There is multifocal hyperintense T2-weighted signal within the white matter. Parenchymal volume and CSF spaces are normal. The midline structures are normal. There is no abnormal contrast enhancement. Vascular: Major flow voids are preserved. Skull and upper cervical spine: Normal calvarium and skull base. Visualized upper cervical spine and soft tissues are normal. Sinuses/Orbits:No paranasal sinus fluid levels or advanced mucosal thickening. No mastoid or middle ear effusion. Normal orbits. IMPRESSION: 1. No acute intracranial abnormality. 2. White matter changes that may be due to chronic small vessel disease or a sequela of cancer  treatment. Electronically Signed   By: Ulyses Jarred M.D.   On: 11/19/2020 00:53   DG Chest Portable 1 View  Result Date: 11/18/2020 CLINICAL DATA:  Altered mental status EXAM: PORTABLE CHEST 1 VIEW COMPARISON:  07/31/2020 FINDINGS: The patient is moderately rotated on this examination. Opacification of the left lung base is related to, in part, overlying soft tissue. However, a superimposed asymmetric interstitial pulmonary infiltrate is noted at the left lung base, possibly infectious in the appropriate clinical setting. No pneumothorax or pleural effusion. Cardiac size within normal limits. No acute bone abnormality. IMPRESSION: Technically limited examination with suspected left basilar focal pulmonary infiltrate, possibly infectious in the acute setting. This could be confirmed with a standard two view chest radiograph, if indicated. Electronically Signed   By: Fidela Salisbury M.D.   On: 11/18/2020 19:00   CT HEAD CODE STROKE WO CONTRAST  Result Date: 11/18/2020 CLINICAL DATA:  Code stroke.  Neuro deficit, acute, stroke suspected EXAM: CT HEAD WITHOUT CONTRAST TECHNIQUE: Contiguous axial images were obtained from the base of the skull through the vertex without intravenous contrast. COMPARISON:  05/10/2020 FINDINGS: Significant motion artifact is present. Findings below are within this limitation. Brain: No acute intracranial hemorrhage, mass effect, or edema. No new loss of gray-white differentiation. No hydrocephalus or extra-axial collection. Vascular: No hyperdense vessel. Skull: Unremarkable. Sinuses/Orbits: No acute finding. Other: Mastoid air cells are clear. ASPECTS [facility] Stroke  Program Early CT Score) - Ganglionic level infarction (caudate, lentiform nuclei, internal capsule, insula, M1-M3 cortex): 7 - Supraganglionic infarction (M4-M6 cortex): 3 Total score (0-10 with 10 being normal): 10 IMPRESSION: There is no acute intracranial hemorrhage or evidence of acute infarction within limitation  of significant motion artifact on many slices. ASPECT score is 10. These results were communicated to Dr. Leonel Ramsay at 5:24 pm on 11/18/2020 by text page via the [facility] messaging system. Electronically Signed   By: Macy Mis M.D.   On: 11/18/2020 17:26     Labs:   Basic Metabolic Panel: Recent Labs  Lab 11/20/20 0449 11/21/20 1406 11/22/20 0308 11/23/20 0237 11/24/20 0330  NA 141 141 142 143 142  K 3.3* 3.3* 3.4* 3.7 3.3*  CL 112* 114* 113* 113* 109  CO2 21* 22 23 24 23   GLUCOSE 135* 198* 124* 92 61*  BUN 21 16 18 20 20   CREATININE 1.26* 1.25* 1.14* 1.21* 1.13*  CALCIUM 7.9* 7.6* 7.6* 7.9* 8.0*  MG  --   --   --  1.1* 1.8  PHOS  --   --   --  1.7*  --    GFR Estimated Creatinine Clearance: 48.5 mL/min (A) (by C-G formula based on SCr of 1.13 mg/dL (H)). Liver Function Tests: Recent Labs  Lab 11/18/20 1656 11/19/20 0500 11/20/20 0449  AST 20 21 29   ALT 12 11 14   ALKPHOS 86 67 69  BILITOT 1.2 1.8* 1.2  PROT 5.3* 4.6* 4.8*  ALBUMIN 2.9* 2.4* 2.4*   No results for input(s): LIPASE, AMYLASE in the last 168 hours. Recent Labs  Lab 11/21/20 0830  AMMONIA 48*   Coagulation profile Recent Labs  Lab 11/18/20 1656  INR 1.1    CBC: Recent Labs  Lab 11/18/20 1656 11/18/20 1709 11/19/20 0500 11/20/20 0449 11/22/20 0308 11/23/20 0237 11/24/20 0330  WBC 6.1  --  13.5* 11.1* 9.0 9.5 8.6  NEUTROABS 3.2  --   --   --   --   --   --   HGB 12.0   < > 11.4* 12.1 10.6* 10.7* 10.7*  HCT 36.9   < > 35.9* 36.8 32.2* 32.2* 31.2*  MCV 94.1  --  96.5 92.5 91.2 91.2 90.7  PLT 135*  --  116* 130* 137* 132* 140*   < > = values in this interval not displayed.   Cardiac Enzymes: No results for input(s): CKTOTAL, CKMB, CKMBINDEX, TROPONINI in the last 168 hours. BNP: Invalid input(s): POCBNP CBG: Recent Labs  Lab 11/24/20 2032 11/24/20 2353 11/25/20 0356 11/25/20 0818 11/25/20 1119  GLUCAP 182* 177* 129* 175* 165*   D-Dimer No results for input(s): DDIMER in  the last 72 hours. Hgb A1c No results for input(s): HGBA1C in the last 72 hours. Lipid Profile No results for input(s): CHOL, HDL, LDLCALC, TRIG, CHOLHDL, LDLDIRECT in the last 72 hours. Thyroid function studies No results for input(s): TSH, T4TOTAL, T3FREE, THYROIDAB in the last 72 hours.  Invalid input(s): FREET3 Anemia work up No results for input(s): VITAMINB12, FOLATE, FERRITIN, TIBC, IRON, RETICCTPCT in the last 72 hours. Microbiology Recent Results (from the past 240 hour(s))  Resp Panel by RT-PCR (Flu A&B, Covid) Nasopharyngeal Swab     Status: None   Collection Time: 11/18/20  4:59 PM   Specimen: Nasopharyngeal Swab; Nasopharyngeal(NP) swabs in vial transport medium  Result Value Ref Range Status   SARS Coronavirus 2 by RT PCR NEGATIVE NEGATIVE Final   Influenza A by PCR NEGATIVE NEGATIVE Final  Influenza B by PCR NEGATIVE NEGATIVE Final    Comment: (NOTE) The Xpert Xpress SARS-CoV-2/FLU/RSV plus assay is intended as an aid in the diagnosis of influenza from Nasopharyngeal swab specimens and should not be used as a sole basis for treatment. Nasal washings and aspirates are unacceptable for Xpert Xpress SARS-CoV-2/FLU/RSV testing.  Fact Sheet for Patients: EntrepreneurPulse.com.au  Fact Sheet for Healthcare Providers: IncredibleEmployment.be  This test is not yet approved or cleared by the [place] FDA and has been authorized for detection and/or diagnosis of SARS-CoV-2 by FDA under an Emergency Use Authorization (EUA). This EUA will remain in effect (meaning this test can be used) for the duration of the COVID-19 declaration under Section 564(b)(1) of the Act, 21 U.S.C. section 360bbb-3(b)(1), unless the authorization is terminated or revoked.  Performed at [facility], [place] [address] [place], [place] [address]   Urine Culture     Status: Abnormal   Collection Time: 11/19/20  9:52 AM   Specimen: Urine, Clean Catch   Result Value Ref Range Status   Specimen Description URINE, CLEAN CATCH  Final   Special Requests   Final    NONE Performed at [facility], [place] [address], [place], [place] [address]    Culture MULTIPLE SPECIES PRESENT, SUGGEST RECOLLECTION (A)  Final   Report Status 11/20/2020 FINAL  Final  MRSA Next Gen by PCR, Nasal     Status: None   Collection Time: 11/19/20  4:23 PM   Specimen: Nasal Mucosa; Nasal Swab  Result Value Ref Range Status   MRSA by PCR Next Gen NOT DETECTED NOT DETECTED Final    Comment: (NOTE) The GeneXpert MRSA Assay (FDA approved for NASAL specimens only), is one component of a comprehensive MRSA colonization surveillance program. It is not intended to diagnose MRSA infection nor to guide or monitor treatment for MRSA infections. Test performance is not FDA approved in patients less than 4 years old. Performed at [facility], [place] [address], [place], [place] [address]   Culture, blood (routine x 2)     Status: None   Collection Time: 11/20/20  2:48 PM   Specimen: BLOOD RIGHT HAND  Result Value Ref Range Status   Specimen Description BLOOD RIGHT HAND  Final   Special Requests   Final    BOTTLES DRAWN AEROBIC AND ANAEROBIC Blood Culture results may not be optimal due to an inadequate volume of blood received in culture bottles   Culture   Final    NO GROWTH 5 DAYS Performed at [facility], [place] [address], [place], [place] [address]    Report Status 11/25/2020 FINAL  Final  Culture, blood (routine x 2)     Status: None   Collection Time: 11/20/20 10:09 PM   Specimen: BLOOD  Result Value Ref Range Status   Specimen Description BLOOD LEFT ARM  Final   Special Requests   Final    BOTTLES DRAWN AEROBIC AND ANAEROBIC Blood Culture adequate volume   Culture   Final    NO GROWTH 5 DAYS Performed at [facility], [place] [address] [place], [place] [address]    Report Status 11/25/2020 FINAL  Final  Resp Panel by RT-PCR (Flu A&B,  Covid) Nasopharyngeal Swab     Status: None   Collection Time: 11/25/20 10:11 AM   Specimen: Nasopharyngeal Swab; Nasopharyngeal(NP) swabs in vial transport medium  Result Value Ref Range Status   SARS Coronavirus 2 by RT PCR NEGATIVE NEGATIVE  Final    Comment: (NOTE) SARS-CoV-2 target nucleic acids are NOT DETECTED.  The SARS-CoV-2 RNA is generally detectable in upper respiratory specimens during the acute phase of infection. The lowest concentration of SARS-CoV-2 viral copies this assay can detect is 138 copies/mL. A negative result does not preclude SARS-Cov-2 infection and should not be used as the sole basis for treatment or other patient management decisions. A negative result may occur with  improper specimen collection/handling, submission of specimen other than nasopharyngeal swab, presence of viral mutation(s) within the areas targeted by this assay, and inadequate number of viral copies(<138 copies/mL). A negative result must be combined with clinical observations, patient history, and epidemiological information. The expected result is Negative.  Fact Sheet for Patients:  EntrepreneurPulse.com.au  Fact Sheet for Healthcare Providers:  IncredibleEmployment.be  This test is no t yet approved or cleared by the [place] FDA and  has been authorized for detection and/or diagnosis of SARS-CoV-2 by FDA under an Emergency Use Authorization (EUA). This EUA will remain  in effect (meaning this test can be used) for the duration of the COVID-19 declaration under Section 564(b)(1) of the Act, 21 U.S.C.section 360bbb-3(b)(1), unless the authorization is terminated  or revoked sooner.       Influenza A by PCR NEGATIVE NEGATIVE Final   Influenza B by PCR NEGATIVE NEGATIVE Final    Comment: (NOTE) The Xpert Xpress SARS-CoV-2/FLU/RSV plus assay is intended as an aid in the diagnosis of influenza from Nasopharyngeal swab specimens and should  not be used as a sole basis for treatment. Nasal washings and aspirates are unacceptable for Xpert Xpress SARS-CoV-2/FLU/RSV testing.  Fact Sheet for Patients: EntrepreneurPulse.com.au  Fact Sheet for Healthcare Providers: IncredibleEmployment.be  This test is not yet approved or cleared by the [place] FDA and has been authorized for detection and/or diagnosis of SARS-CoV-2 by FDA under an Emergency Use Authorization (EUA). This EUA will remain in effect (meaning this test can be used) for the duration of the COVID-19 declaration under Section 564(b)(1) of the Act, 21 U.S.C. section 360bbb-3(b)(1), unless the authorization is terminated or revoked.  Performed at [facility], [place] [address], [place], [place] [address]      Discharge Instructions:   Discharge Instructions     Diet - low sodium heart healthy   Complete by: As directed    Diet Carb Modified   Complete by: As directed    Discharge instructions   Complete by: As directed    Follow-up with your primary care physician at the skilled nursing facility in 3 to 5 days.  Check blood work at that time.   Increase activity slowly   Complete by: As directed       Allergies as of 11/25/2020       Reactions   Ozempic (0.25 Or 0.5 Mg-dose) [semaglutide(0.25 Or 0.5mg -dos)] Nausea And Vomiting   Amoxicillin    Other reaction(s): cellulitis   Canagliflozin    Other reaction(s): UTI   Dulaglutide Other (See Comments)   GI Upset   Lipitor [atorvastatin] Other (See Comments)   Leg cramps   Metformin Other (See Comments)   Diarrhea   Other Nausea And Vomiting   Pt states does not know drug but anesthesia drugs have made her have nausea and vomiting.    Pregabalin Er    Other reaction(s): dizziness   Sulfa Antibiotics    diarrhea   Potassium-containing Compounds Rash        Medication List  TAKE these medications    acetaminophen 500 MG tablet Commonly  known as: TYLENOL Take 1 tablet (500 mg total) by mouth every 4 (four) hours as needed for moderate pain, headache or fever. What changed:  how much to take when to take this reasons to take this   aspirin EC 81 MG tablet Take 81 mg by mouth daily. Swallow whole.   cetirizine 10 MG tablet Commonly known as: ZYRTEC Take 10 mg by mouth daily.   diclofenac Sodium 1 % Gel Commonly known as: Voltaren Apply 2 g topically 4 (four) times daily. To knee pain   fenofibrate 160 MG tablet Take 160 mg by mouth every morning.   HumaLOG KwikPen 200 UNIT/ML KwikPen Generic drug: insulin lispro Inject 10 Units into the skin daily with supper.   hydrocortisone cream 1 % Apply 1 application topically daily as needed for itching.   insulin degludec 200 UNIT/ML FlexTouch Pen Commonly known as: TRESIBA Inject 40 Units into the skin daily with breakfast.   loperamide 2 MG tablet Commonly known as: IMODIUM A-D Take 4 mg by mouth as needed for diarrhea or loose stools.   LUBRICATING EYE DROPS OP Place 1 drop into both eyes daily as needed (irritation).   melatonin 5 MG Tabs Take 5-10 mg by mouth at bedtime as needed (sleep).   omeprazole 20 MG capsule Commonly known as: PRILOSEC Take 20 mg by mouth daily.   prochlorperazine 10 MG tablet Commonly known as: COMPAZINE Take 1 tablet (10 mg total) by mouth every 6 (six) hours as needed for nausea or vomiting.   simvastatin 20 MG tablet Commonly known as: ZOCOR Take 20 mg by mouth every evening.   telmisartan-hydrochlorothiazide 80-25 MG tablet Commonly known as: MICARDIS HCT Take 1 tablet by mouth daily.   thiamine 100 MG tablet Take 1 tablet (100 mg total) by mouth daily.   traMADol 50 MG tablet Commonly known as: ULTRAM Take 1 tablet (50 mg total) by mouth every 12 (twelve) hours as needed for severe pain or moderate pain. What changed:  how much to take when to take this reasons to take this   vitamin B-12 500 MCG  tablet Commonly known as: CYANOCOBALAMIN Take 1 tablet (500 mcg total) by mouth daily.        Follow-up Information     [name], MD Follow up in 1 week(s).   Specialty: Family Medicine Contact information: [address] [place] [address] [phone]                  Time coordinating discharge: 39 minutes  Signed:  Yariana Hoaglund  [facility] 11/25/2020, 12:46 PM

## 2020-11-25 NOTE — TOC Transition Note (Signed)
Transition of Care [facility]) - CM/SW Discharge Note   Patient Details  Name: Elizabeth Crosby MRN: [id number] Date of Birth: [birth year]  Transition of [facility] CM/SW Contact:  Coralee Pesa, [place] Phone Number: 11/25/2020, 10:35 AM   Clinical Narrative:    Pt to be transported via [facility] to [facility]. Nurse to call report to [phone]. Rm# [phone]. Please update daughter when pt has been picked up by [facility].   Final next level of care: Skilled Nursing Facility Barriers to Discharge: Barriers Resolved   Patient Goals and CMS Choice   CMS Medicare.gov Compare Post Acute Care list provided to:: Patient Represenative (must comment) Choice offered to / list presented to : Spouse  Discharge Placement              Patient [facility] bed at: [facility] Patient to be transferred to facility by: [place] Name of family member notified: Delona Clasby Patient and family notified of of transfer: 11/25/20  Discharge Plan and Services     Post Acute Care Choice: [place]                               Social Determinants of Health (SDOH) Interventions     Readmission Risk Interventions No flowsheet data found.

## 2020-11-25 NOTE — Progress Notes (Signed)
Report called to [facility] 

## 2020-11-27 DIAGNOSIS — I10 Essential (primary) hypertension: Secondary | ICD-10-CM | POA: Diagnosis not present

## 2020-11-27 DIAGNOSIS — E119 Type 2 diabetes mellitus without complications: Secondary | ICD-10-CM | POA: Diagnosis not present

## 2020-11-28 DIAGNOSIS — M170 Bilateral primary osteoarthritis of knee: Secondary | ICD-10-CM | POA: Diagnosis not present

## 2020-11-28 DIAGNOSIS — R52 Pain, unspecified: Secondary | ICD-10-CM | POA: Diagnosis not present

## 2020-11-28 DIAGNOSIS — E785 Hyperlipidemia, unspecified: Secondary | ICD-10-CM | POA: Diagnosis not present

## 2020-11-29 DIAGNOSIS — G9341 Metabolic encephalopathy: Secondary | ICD-10-CM | POA: Diagnosis not present

## 2020-11-29 DIAGNOSIS — I10 Essential (primary) hypertension: Secondary | ICD-10-CM | POA: Diagnosis not present

## 2020-11-30 DIAGNOSIS — D649 Anemia, unspecified: Secondary | ICD-10-CM | POA: Diagnosis not present

## 2020-11-30 DIAGNOSIS — E559 Vitamin D deficiency, unspecified: Secondary | ICD-10-CM | POA: Diagnosis not present

## 2020-11-30 DIAGNOSIS — E039 Hypothyroidism, unspecified: Secondary | ICD-10-CM | POA: Diagnosis not present

## 2020-11-30 DIAGNOSIS — D518 Other vitamin B12 deficiency anemias: Secondary | ICD-10-CM | POA: Diagnosis not present

## 2020-11-30 DIAGNOSIS — E119 Type 2 diabetes mellitus without complications: Secondary | ICD-10-CM | POA: Diagnosis not present

## 2020-11-30 DIAGNOSIS — N189 Chronic kidney disease, unspecified: Secondary | ICD-10-CM | POA: Diagnosis not present

## 2020-12-05 DIAGNOSIS — E8809 Other disorders of plasma-protein metabolism, not elsewhere classified: Secondary | ICD-10-CM | POA: Diagnosis not present

## 2020-12-05 DIAGNOSIS — R5381 Other malaise: Secondary | ICD-10-CM | POA: Diagnosis not present

## 2020-12-05 DIAGNOSIS — E785 Hyperlipidemia, unspecified: Secondary | ICD-10-CM | POA: Diagnosis not present

## 2020-12-08 DIAGNOSIS — R52 Pain, unspecified: Secondary | ICD-10-CM | POA: Diagnosis not present

## 2020-12-08 DIAGNOSIS — R5381 Other malaise: Secondary | ICD-10-CM | POA: Diagnosis not present

## 2020-12-08 DIAGNOSIS — E8809 Other disorders of plasma-protein metabolism, not elsewhere classified: Secondary | ICD-10-CM | POA: Diagnosis not present

## 2020-12-13 ENCOUNTER — Other Ambulatory Visit: Payer: Self-pay | Admitting: Family Medicine

## 2020-12-13 ENCOUNTER — Ambulatory Visit
Admission: RE | Admit: 2020-12-13 | Discharge: 2020-12-13 | Disposition: A | Discharge status: Home / Self Care | Payer: Medicare Other | Source: Ambulatory Visit | Attending: Family Medicine | Admitting: Family Medicine

## 2020-12-13 DIAGNOSIS — Z23 Encounter for immunization: Secondary | ICD-10-CM | POA: Diagnosis not present

## 2020-12-13 DIAGNOSIS — Z8701 Personal history of pneumonia (recurrent): Secondary | ICD-10-CM

## 2020-12-13 DIAGNOSIS — I129 Hypertensive chronic kidney disease with stage 1 through stage 4 chronic kidney disease, or unspecified chronic kidney disease: Secondary | ICD-10-CM | POA: Diagnosis not present

## 2020-12-13 DIAGNOSIS — E782 Mixed hyperlipidemia: Secondary | ICD-10-CM | POA: Diagnosis not present

## 2020-12-13 DIAGNOSIS — D649 Anemia, unspecified: Secondary | ICD-10-CM | POA: Diagnosis not present

## 2020-12-13 DIAGNOSIS — D696 Thrombocytopenia, unspecified: Secondary | ICD-10-CM | POA: Diagnosis not present

## 2020-12-13 DIAGNOSIS — E876 Hypokalemia: Secondary | ICD-10-CM | POA: Diagnosis not present

## 2020-12-13 DIAGNOSIS — E1122 Type 2 diabetes mellitus with diabetic chronic kidney disease: Secondary | ICD-10-CM | POA: Diagnosis not present

## 2020-12-13 DIAGNOSIS — G9340 Encephalopathy, unspecified: Secondary | ICD-10-CM | POA: Diagnosis not present

## 2020-12-14 MED FILL — TELMISARTAN-HCTZ 80-25 MG TAB: 90 days supply | Qty: 90 | Fill #0

## 2021-01-24 ENCOUNTER — Encounter: Payer: Self-pay | Admitting: Radiology

## 2021-01-28 NOTE — Progress Notes (Signed)
Radiation Oncology         [phone] ________________________________  Name: Elizabeth Crosby MRN: [id number]  Date: 01/29/2021  DOB: [birth year]  Follow-Up Visit Note  CC: [name], MD  [name], MD    ICD-10-CM   1. Endometrial cancer ([place])  C54.1       Diagnosis: Stage IB grade 1 Endometrioid Endometrial Adenocarcinoma (MSI stable, MMR normal)   Interval Since Last Radiation: 3  years, 1 month, and 5 days   Radiation treatment dates:  11/27/2017, 12/04/2017, 12/11/2017, 12/18/2017, 12/25/2017   Site/dose:   Vaginal Cuff / 30 Gy in 5 fractions  Narrative:  The patient returns today for routine annual follow-up. Since her last visit, the patient followed with Dr. [name] on 07/15/20. During which time,  the patient was noted to have no new symptoms concerning for disease recurrence.  Also since the patient was last seen, the patient has had several ED admissions, detailed as follows:  -- [facility] on 05/10/20: patient presented to the ED after she fell and hit the back of her head resulting in a headache. CT of the head and cervical spine performed during ED course revealed a minimal parafalcine subdural hemorrhage, and a possible trace subarachnoid hemorrhage in the posterior right frontal region. --ED to hospital admission on 11/18/20: patient presented  to the hospital with secondary to altered mental status and seizure witnessed during EMS transportation.  Patient was found to have a LLL pneumonia and was started on antibiotics. Neurology was consulted and patient underwent lumbar puncture on 11/20/20 not consistent with infection.  Patient underwent long-term EEG monitoring as well without features of seizure disorder. Patient completed course of Rocephin and Zithromax while in hospital. Patient was also found to have acute metabolic encephalopathy secondary to pneumonia which resolved during treatment.        Imaging performed during hospital admission on 11/18/20  through 11/25/20 includes:  -- CT of the head on 11/18/20 which demonstrated no acute intracranial hemorrhage or evidence of acute infarction. --CXR on 11/18/20 revealed suspected left basilar focal pulmonary infiltrate, possibly infectious in the acute setting. Additional chest x-ray on 08/20 showed left basilar airspace disease.     --MRI of the brain with and without contrast on 08/20 demonstrated no acute intracranial abnormality and white matter changes noted as possibly attributed to chronic small vessel disease or a sequela of cancer treatment.  --CTA of the head on 08/24 was negative for acute abnormality or intracranial large vessel occlusion. Also seen was 50% diameter stenosis of the proximal left internal carotid artery due to atherosclerotic calcification, and fusiform dilation of the internal carotid artery below the skull base bilaterally, noted as possibly related for fibromuscular dysplasia.               Pertinent imaging since then includes CXR performed on 12/13/20 due to history of pneumonia which demonstrated improved aeration of the lungs, with resolution of the previous left-sided interstitial/airspace disease.        The patient has since retired from working at [employer] at [employer].   Allergies:  is allergic to ozempic (0.25 or 0.5 mg-dose) [semaglutide(0.25 or 0.34m-dos)], amoxicillin, canagliflozin, dulaglutide, lipitor [atorvastatin], metformin, other, pregabalin er, sulfa antibiotics, and potassium-containing compounds.  Meds: Current Outpatient Medications  Medication Sig Dispense Refill   diclofenac Sodium (VOLTAREN) 1 % GEL Apply 2 g topically 4 (four) times daily.     acetaminophen (TYLENOL) 500 MG tablet Take 1 tablet (500 mg total) by  mouth every 4 (four) hours as needed for moderate pain, headache or fever.     aspirin EC 81 MG tablet Take 81 mg by mouth daily. Swallow whole.     Carboxymethylcellul-Glycerin (LUBRICATING EYE DROPS OP) Place 1 drop  into both eyes daily as needed (irritation).     cetirizine (ZYRTEC) 10 MG tablet Take 10 mg by mouth daily.      fenofibrate 160 MG tablet Take 160 mg by mouth every morning.      HUMALOG KWIKPEN 200 UNIT/ML KwikPen Inject 10 Units into the skin daily with supper.     hydrocortisone cream 1 % Apply 1 application topically daily as needed for itching.     insulin degludec (TRESIBA) 200 UNIT/ML FlexTouch Pen Inject 40 Units into the skin daily with breakfast.     loperamide (IMODIUM A-D) 2 MG tablet Take 4 mg by mouth as needed for diarrhea or loose stools.     Melatonin 5 MG TABS Take 5-10 mg by mouth at bedtime as needed (sleep).     omeprazole (PRILOSEC) 20 MG capsule Take 20 mg by mouth daily.     prochlorperazine (COMPAZINE) 10 MG tablet Take 1 tablet (10 mg total) by mouth every 6 (six) hours as needed for nausea or vomiting. 30 tablet 0   simvastatin (ZOCOR) 20 MG tablet Take 20 mg by mouth every evening.     telmisartan-hydrochlorothiazide (MICARDIS HCT) 80-25 MG tablet Take 1 tablet by mouth daily.  0   traMADol (ULTRAM) 50 MG tablet Take 1 tablet (50 mg total) by mouth every 12 (twelve) hours as needed for severe pain or moderate pain. 10 tablet 0   No current facility-administered medications for this encounter.    Physical Findings: The patient is in no acute distress. Patient is alert and oriented.  height is 5' 1"  (1.549 m) and weight is 206 lb 0.6 oz (93.5 kg). Her temperature is 97.6 F (36.4 C). Her blood pressure is 158/54 (abnormal) and her pulse is 82. Her respiration is 20 and oxygen saturation is 100%. .  Lungs are clear to auscultation bilaterally. Heart has regular rate and rhythm. No palpable cervical, supraclavicular, or axillary adenopathy. Abdomen soft, non-tender, normal bowel sounds.  On pelvic examination the external genitalia are unremarkable.  A vaginal exam was performed.  There are no mucosal lesions noted in the vaginal vault.  No lesions noted along the  vaginal cuff region.  On bimanual examination no pelvic masses appreciated.  Patient does have a erythematous rash along the skin folds of the lower abdominal area particularly along the right side.  This is suspicious for a yeast infection.   Lab Findings: Lab Results  Component Value Date   WBC 8.6 11/24/2020   HGB 10.7 (L) 11/24/2020   HCT 31.2 (L) 11/24/2020   MCV 90.7 11/24/2020   PLT 140 (L) 11/24/2020    Radiographic Findings: No results found.  Impression:  Stage IB grade 1 Endometrioid Endometrial Adenocarcinoma (MSI stable, MMR normal)   No evidence of recurrence on clinical exam.  Patient likely has a yeast infection along the skin folds of the right lower abdominal region.  She has been given antifungal powder to use to see if this will help clear up this rash.  If this does not improve I recommend she follow-up with her primary care physician concerning this issue.  Plan: Patient will follow up with Dr. Berline Lopes in 6 months.  Routine follow-up in radiation oncology in 1 year.   25  minutes of total time was spent for this patient encounter, including preparation, face-to-face counseling with the patient and coordination of care, physical exam, and documentation of the encounter. ____________________________________  Blair Promise, PhD, MD   This document serves as a record of services personally performed by Gery Pray, MD. It was created on his behalf by Roney Mans, a trained medical scribe. The creation of this record is based on the scribe's personal observations and the provider's statements to them. This document has been checked and approved by the attending provider.

## 2021-01-29 ENCOUNTER — Other Ambulatory Visit: Payer: Self-pay

## 2021-01-29 ENCOUNTER — Ambulatory Visit
Admission: RE | Admit: 2021-01-29 | Discharge: 2021-01-29 | Disposition: A | Discharge status: Home / Self Care | Payer: Medicare Other | Source: Ambulatory Visit | Attending: Radiation Oncology | Admitting: Radiation Oncology

## 2021-01-29 ENCOUNTER — Encounter: Payer: Self-pay | Admitting: Radiation Oncology

## 2021-01-29 DIAGNOSIS — Z79899 Other long term (current) drug therapy: Secondary | ICD-10-CM | POA: Insufficient documentation

## 2021-01-29 DIAGNOSIS — Z923 Personal history of irradiation: Secondary | ICD-10-CM | POA: Insufficient documentation

## 2021-01-29 DIAGNOSIS — C541 Malignant neoplasm of endometrium: Secondary | ICD-10-CM

## 2021-01-29 DIAGNOSIS — R21 Rash and other nonspecific skin eruption: Secondary | ICD-10-CM | POA: Diagnosis not present

## 2021-01-29 DIAGNOSIS — Z8542 Personal history of malignant neoplasm of other parts of uterus: Secondary | ICD-10-CM | POA: Insufficient documentation

## 2021-01-29 DIAGNOSIS — R4182 Altered mental status, unspecified: Secondary | ICD-10-CM | POA: Insufficient documentation

## 2021-01-29 DIAGNOSIS — Z7982 Long term (current) use of aspirin: Secondary | ICD-10-CM | POA: Diagnosis not present

## 2021-01-29 DIAGNOSIS — Z8701 Personal history of pneumonia (recurrent): Secondary | ICD-10-CM | POA: Insufficient documentation

## 2021-01-29 DIAGNOSIS — Z08 Encounter for follow-up examination after completed treatment for malignant neoplasm: Secondary | ICD-10-CM | POA: Diagnosis not present

## 2021-01-29 NOTE — Progress Notes (Signed)
Elizabeth Crosby is here today for follow up post radiation to the pelvic.  They completed their radiation on: 12/25/17  Does the patient complain of any of the following:  Pain: No complaints of pelvic pain. Reports having some abdominal discomfort.  Abdominal bloating: Yes Diarrhea/Constipation: no Nausea/Vomiting: no Vaginal Discharge: no Blood in Urine or Stool: no Urinary Issues (dysuria/incomplete emptying/ incontinence/ increased frequency/urgency): Patient reports having frequency and urgency.  Does patient report using vaginal dilator 2-3 times a week and/or sexually active 2-3 weeks: no Post radiation skin changes: Noted patient to have increased redness to abdominal fold. Patient reports area being worse on the right side    Additional comments if applicable:   Patient reports having increase hair loss.  Vitals:   01/29/21 1430  BP: (!) 158/54  Pulse: 82  Resp: 20  Temp: 97.6 F (36.4 C)  SpO2: 100%  Weight: 206 lb 0.6 oz (93.5 kg)  Height: 5\' 1"  (1.549 m)

## 2021-04-05 DIAGNOSIS — N183 Chronic kidney disease, stage 3 unspecified: Secondary | ICD-10-CM | POA: Diagnosis not present

## 2021-04-05 DIAGNOSIS — I129 Hypertensive chronic kidney disease with stage 1 through stage 4 chronic kidney disease, or unspecified chronic kidney disease: Secondary | ICD-10-CM | POA: Diagnosis not present

## 2021-04-05 DIAGNOSIS — E782 Mixed hyperlipidemia: Secondary | ICD-10-CM | POA: Diagnosis not present

## 2021-04-05 DIAGNOSIS — N390 Urinary tract infection, site not specified: Secondary | ICD-10-CM | POA: Diagnosis not present

## 2021-04-05 DIAGNOSIS — E1122 Type 2 diabetes mellitus with diabetic chronic kidney disease: Secondary | ICD-10-CM | POA: Diagnosis not present

## 2021-04-05 DIAGNOSIS — R600 Localized edema: Secondary | ICD-10-CM | POA: Diagnosis not present

## 2021-04-23 DIAGNOSIS — N390 Urinary tract infection, site not specified: Secondary | ICD-10-CM | POA: Diagnosis not present

## 2021-04-26 ENCOUNTER — Telehealth: Payer: Self-pay | Admitting: Oncology

## 2021-04-26 NOTE — Telephone Encounter (Signed)
Winnie called and verified her appointment with Dr. Miquel Dunn on 01/28/22 at 3:00.

## 2021-05-21 ENCOUNTER — Telehealth: Payer: Self-pay | Admitting: *Deleted

## 2021-05-21 NOTE — Telephone Encounter (Signed)
CALLED PATIENT TO INFORM OF FU APPT. WITH DR. [place] ON 07-25-21- ARRIVAL TIME- 12:15 PM, SPOKE WITH PATIENT AND SHE IS AWARE OF THIS APPT.

## 2021-05-21 NOTE — Telephone Encounter (Signed)
Elizabeth Crosby from radiation called and scheduled a follow up appt with Dr Delsa Sale for April. Elizabeth Crosby will contact the patient with the appt

## 2021-07-05 DIAGNOSIS — R600 Localized edema: Secondary | ICD-10-CM | POA: Diagnosis not present

## 2021-07-05 DIAGNOSIS — N183 Chronic kidney disease, stage 3 unspecified: Secondary | ICD-10-CM | POA: Diagnosis not present

## 2021-07-05 DIAGNOSIS — E1122 Type 2 diabetes mellitus with diabetic chronic kidney disease: Secondary | ICD-10-CM | POA: Diagnosis not present

## 2021-07-05 DIAGNOSIS — I129 Hypertensive chronic kidney disease with stage 1 through stage 4 chronic kidney disease, or unspecified chronic kidney disease: Secondary | ICD-10-CM | POA: Diagnosis not present

## 2021-07-05 DIAGNOSIS — E782 Mixed hyperlipidemia: Secondary | ICD-10-CM | POA: Diagnosis not present

## 2021-07-19 DIAGNOSIS — Z79899 Other long term (current) drug therapy: Secondary | ICD-10-CM | POA: Diagnosis not present

## 2021-07-19 DIAGNOSIS — N179 Acute kidney failure, unspecified: Secondary | ICD-10-CM | POA: Diagnosis not present

## 2021-07-25 ENCOUNTER — Inpatient Hospital Stay: Payer: Medicare Other | Attending: Obstetrics & Gynecology | Admitting: Obstetrics & Gynecology

## 2021-07-25 DIAGNOSIS — C541 Malignant neoplasm of endometrium: Secondary | ICD-10-CM

## 2021-07-25 NOTE — Assessment & Plan Note (Signed)
76y.o.  year old a stage IB grade 1endometrioid endometrial adenocarcinoma (MSI stable, MMR normal). ?? ?Pathology revealed high/intermediate risk factors for recurrence, therefore vaginal brachytherapy adjuvant therapy was recommended according to NCCN guidelines. It was completed in September, 2019. ? ?Negative symptom review, normal exam.  No evidence of recurrence ?? ?>continue f/u every 6 months until she is 5 years out from completion of therapy (September, 2024).  ?

## 2021-07-25 NOTE — Progress Notes (Unsigned)
Follow Up Note: Gyn-Onc ? ?Elizabeth Crosby 76 y.o. female ? ?CC:  ? ? ?HPI: The oncology history was reviewed. ? ?Interval History: She denies any vaginal bleeding, abdominal/pelvic pain, cough, lethargy or abdominal distention.    ? ?Review of Systems  ?Review of Systems  ?Constitutional:  Negative for malaise/fatigue and weight loss.  ?Respiratory:  Negative for shortness of breath and wheezing.   ?Cardiovascular:  Negative for chest pain and leg swelling.  ?Gastrointestinal:  Negative for abdominal pain, blood in stool, constipation, nausea and vomiting.  ?Genitourinary:  Negative for dysuria, frequency, hematuria and urgency.  ?Musculoskeletal:  Negative for joint pain and myalgias.  ?Neurological:  Negative for weakness.  ?Psychiatric/Behavioral:  Negative for depression. The patient does not have insomnia.   ? ?Current medications, allergy, social history, past surgical history, past medical history, family history were all reviewed. ? ? ? ?Vitals:  There were no vitals taken for this visit. ? ?Physical Exam:  ?Physical Exam ?Exam conducted with a chaperone present.  ?Constitutional:   ?   General: She is not in acute distress. ?Cardiovascular:  ?   Rate and Rhythm: Normal rate and regular rhythm.  ?Pulmonary:  ?   Effort: Pulmonary effort is normal.  ?   Breath sounds: Normal breath sounds. No wheezing or rhonchi.  ?Abdominal:  ?   Palpations: Abdomen is soft.  ?   Tenderness: There is no abdominal tenderness. There is no right CVA tenderness or left CVA tenderness.  ?   Hernia: No hernia is present.  ?Genitourinary: ?   General: Normal vulva.  ?   Urethra: No urethral lesion.  ?   Vagina: No lesions. No bleeding ?Musculoskeletal:  ?   Cervical back: Neck supple.  ?   Right lower leg: No edema.  ?   Left lower leg: No edema.  ?Lymphadenopathy:  ?   Upper Body:  ?   Right upper body: No supraclavicular adenopathy.  ?   Left upper body: No supraclavicular adenopathy.  ?   Lower Body: No right inguinal  adenopathy. No left inguinal adenopathy.  ?Skin: ?   Findings: No rash.  ?Neurological:  ?   Mental Status: She is oriented to person, place, and time.  ? ?Assessment/Plan: *** ? ? ?[name], MD  ?

## 2021-07-26 DIAGNOSIS — Z0130 Encounter for examination of blood pressure without abnormal findings: Secondary | ICD-10-CM | POA: Diagnosis not present

## 2021-07-26 DIAGNOSIS — N179 Acute kidney failure, unspecified: Secondary | ICD-10-CM | POA: Diagnosis not present

## 2021-07-31 DIAGNOSIS — R109 Unspecified abdominal pain: Secondary | ICD-10-CM | POA: Diagnosis not present

## 2021-07-31 DIAGNOSIS — R197 Diarrhea, unspecified: Secondary | ICD-10-CM | POA: Diagnosis not present

## 2021-08-01 DIAGNOSIS — R197 Diarrhea, unspecified: Secondary | ICD-10-CM | POA: Diagnosis not present

## 2021-08-01 DIAGNOSIS — R109 Unspecified abdominal pain: Secondary | ICD-10-CM | POA: Diagnosis not present

## 2021-08-02 ENCOUNTER — Telehealth: Payer: Self-pay | Admitting: Oncology

## 2021-08-02 NOTE — Telephone Encounter (Signed)
Elizabeth Crosby called and said she had missed her last appointment with Dr. Delsa Sale on 4/26.  Rescheduled appointment to 08/08/21 at 12:45.  She verbalized agreement of new appointment date and time. ?

## 2021-08-08 ENCOUNTER — Inpatient Hospital Stay: Payer: Medicare Other | Attending: Obstetrics & Gynecology | Admitting: Obstetrics & Gynecology

## 2021-08-08 ENCOUNTER — Other Ambulatory Visit: Payer: Self-pay

## 2021-08-08 ENCOUNTER — Encounter: Payer: Self-pay | Admitting: Obstetrics & Gynecology

## 2021-08-08 VITALS — BP 144/43 | HR 63 | Temp 97.9°F | Resp 16 | Ht 61.0 in | Wt 218.0 lb

## 2021-08-08 DIAGNOSIS — Z8542 Personal history of malignant neoplasm of other parts of uterus: Secondary | ICD-10-CM | POA: Diagnosis present

## 2021-08-08 DIAGNOSIS — C541 Malignant neoplasm of endometrium: Secondary | ICD-10-CM

## 2021-08-08 DIAGNOSIS — Z923 Personal history of irradiation: Secondary | ICD-10-CM | POA: Insufficient documentation

## 2021-08-08 NOTE — Progress Notes (Signed)
Follow Up Note: Gyn-Onc ? ?SABAH ZUCCO 76 y.o. female ? ?CC: She presents for a f/u visit ? ? ?HPI: The oncology history was reviewed. ? ?Interval History: She denies any vaginal bleeding, abdominal/pelvic pain, cough, lethargy or abdominal distention. She was seen in f/u by RAD-ONC in 10/22 and was felt to be NED clinically. Recently evaluated by her PCP for diarrhea.    ? ?Review of Systems  ?Review of Systems  ?Constitutional:  Negative for malaise/fatigue and weight loss.  ?Respiratory:  Negative for shortness of breath and wheezing.   ?Cardiovascular:  Negative for chest pain and leg swelling.  ?Gastrointestinal:  Negative for abdominal pain, blood in stool, constipation, nausea and vomiting.  ?Genitourinary:  Negative for dysuria, frequency, hematuria and urgency.  ?Musculoskeletal:  Negative for joint pain and myalgias.  ?Neurological:  Negative for weakness.  ?Psychiatric/Behavioral:  Negative for depression. The patient does not have insomnia.   ? ?Current medications, allergy, social history, past surgical history, past medical history, family history were all reviewed. ? ? ? ?Vitals:  BP (!) 144/43 (BP Location: Left Arm, Patient Position: Sitting)   Pulse 63   Temp 97.9 ?F (36.6 ?C) (Oral)   Resp 16   Ht 5' 1"  (1.549 m)   Wt 218 lb (98.9 kg)   SpO2 100%   BMI 41.19 kg/m?   ? ?Physical Exam:  ?Physical Exam ?Exam conducted with a chaperone present.  ?Constitutional:   ?   General: She is not in acute distress. ?Cardiovascular:  ?   Rate and Rhythm: Normal rate and regular rhythm.  ?Pulmonary:  ?   Effort: Pulmonary effort is normal.  ?   Breath sounds: Normal breath sounds. No wheezing or rhonchi.  ?Abdominal:  ?   Palpations: Abdomen is soft.  ?   Tenderness: There is no abdominal tenderness. There is no right CVA tenderness or left CVA tenderness.  ?   Hernia: No hernia is present.  ?Genitourinary: ?   General: Normal vulva. Angiokeratomas ?   Urethra: No urethral lesion.  ?   Vagina: No  lesions. No bleeding; foreshortened vagina ?Musculoskeletal:  ?   Cervical back: Neck supple.  ?   Right lower leg: No edema.  ?   Left lower leg: No edema.  ?Lymphadenopathy:  ?   Upper Body:  ?   Right upper body: No supraclavicular adenopathy.  ?   Left upper body: No supraclavicular adenopathy.  ?   Lower Body: No right inguinal adenopathy. No left inguinal adenopathy.  ?Skin: ?   Findings: No rash.  ?Neurological:  ?   Mental Status: She is oriented to person, place, and time.  ? ?Assessment/Plan:  ?Endometrial cancer ([place]) ?76 yo with a h/o a stage IB EC (MSI stable, MMR normal) ?Vaginal brachytherapy completed in 9/19 ? ?Negative symptom review, normal exam.  No evidence of recurrence ? ?>continue f/u every 6 mos until 5 yrs out from completion of therapy (9/24); see RAD-ONC in 6 mos; return in 1 yr  ? ? ?[name], MD  ?

## 2021-08-08 NOTE — Assessment & Plan Note (Addendum)
76 yo with a h/o a stage IB EC (MSI stable, MMR normal) ?Vaginal brachytherapy completed in 9/19 ? ?Negative symptom review, normal exam.  No evidence of recurrence ? ?>continue f/u every 6 mos until 5 yrs out from completion of therapy (9/24); see RAD-ONC in 6 mos; return in 1 yr ?

## 2021-08-08 NOTE — Patient Instructions (Signed)
Return in 1 year ?

## 2021-08-16 DIAGNOSIS — M461 Sacroiliitis, not elsewhere classified: Secondary | ICD-10-CM | POA: Diagnosis not present

## 2021-08-23 DIAGNOSIS — N183 Chronic kidney disease, stage 3 unspecified: Secondary | ICD-10-CM | POA: Diagnosis not present

## 2021-08-23 DIAGNOSIS — R609 Edema, unspecified: Secondary | ICD-10-CM | POA: Diagnosis not present

## 2021-08-23 DIAGNOSIS — D649 Anemia, unspecified: Secondary | ICD-10-CM | POA: Diagnosis not present

## 2021-08-23 DIAGNOSIS — R1900 Intra-abdominal and pelvic swelling, mass and lump, unspecified site: Secondary | ICD-10-CM | POA: Diagnosis not present

## 2021-08-23 DIAGNOSIS — K8681 Exocrine pancreatic insufficiency: Secondary | ICD-10-CM | POA: Diagnosis not present

## 2021-08-23 DIAGNOSIS — K219 Gastro-esophageal reflux disease without esophagitis: Secondary | ICD-10-CM | POA: Diagnosis not present

## 2021-09-20 DIAGNOSIS — K8681 Exocrine pancreatic insufficiency: Secondary | ICD-10-CM | POA: Diagnosis not present

## 2021-09-20 DIAGNOSIS — J309 Allergic rhinitis, unspecified: Secondary | ICD-10-CM | POA: Diagnosis not present

## 2021-09-20 DIAGNOSIS — E1122 Type 2 diabetes mellitus with diabetic chronic kidney disease: Secondary | ICD-10-CM | POA: Diagnosis not present

## 2021-09-20 DIAGNOSIS — Z0000 Encounter for general adult medical examination without abnormal findings: Secondary | ICD-10-CM | POA: Diagnosis not present

## 2021-09-20 DIAGNOSIS — K219 Gastro-esophageal reflux disease without esophagitis: Secondary | ICD-10-CM | POA: Diagnosis not present

## 2021-09-20 DIAGNOSIS — I129 Hypertensive chronic kidney disease with stage 1 through stage 4 chronic kidney disease, or unspecified chronic kidney disease: Secondary | ICD-10-CM | POA: Diagnosis not present

## 2021-09-20 DIAGNOSIS — N183 Chronic kidney disease, stage 3 unspecified: Secondary | ICD-10-CM | POA: Diagnosis not present

## 2021-09-20 DIAGNOSIS — G4733 Obstructive sleep apnea (adult) (pediatric): Secondary | ICD-10-CM | POA: Diagnosis not present

## 2021-09-20 DIAGNOSIS — E782 Mixed hyperlipidemia: Secondary | ICD-10-CM | POA: Diagnosis not present

## 2021-09-21 DIAGNOSIS — I129 Hypertensive chronic kidney disease with stage 1 through stage 4 chronic kidney disease, or unspecified chronic kidney disease: Secondary | ICD-10-CM | POA: Diagnosis not present

## 2021-10-03 ENCOUNTER — Other Ambulatory Visit: Payer: Self-pay

## 2021-10-03 ENCOUNTER — Emergency Department (HOSPITAL_COMMUNITY): Payer: Medicare Other

## 2021-10-03 ENCOUNTER — Inpatient Hospital Stay (HOSPITAL_COMMUNITY)
Admission: EM | Admit: 2021-10-03 | Discharge: 2021-10-09 | DRG: 208 | Disposition: A | Discharge status: Skilled Nursing Facility | Payer: Medicare Other | Attending: Family Medicine | Admitting: Family Medicine

## 2021-10-03 ENCOUNTER — Inpatient Hospital Stay (HOSPITAL_COMMUNITY): Payer: Medicare Other

## 2021-10-03 DIAGNOSIS — Z923 Personal history of irradiation: Secondary | ICD-10-CM

## 2021-10-03 DIAGNOSIS — N179 Acute kidney failure, unspecified: Secondary | ICD-10-CM | POA: Diagnosis present

## 2021-10-03 DIAGNOSIS — J341 Cyst and mucocele of nose and nasal sinus: Secondary | ICD-10-CM | POA: Diagnosis not present

## 2021-10-03 DIAGNOSIS — E870 Hyperosmolality and hypernatremia: Secondary | ICD-10-CM | POA: Diagnosis not present

## 2021-10-03 DIAGNOSIS — N1831 Chronic kidney disease, stage 3a: Secondary | ICD-10-CM | POA: Diagnosis not present

## 2021-10-03 DIAGNOSIS — I5032 Chronic diastolic (congestive) heart failure: Secondary | ICD-10-CM | POA: Diagnosis not present

## 2021-10-03 DIAGNOSIS — E785 Hyperlipidemia, unspecified: Secondary | ICD-10-CM | POA: Diagnosis present

## 2021-10-03 DIAGNOSIS — J690 Pneumonitis due to inhalation of food and vomit: Principal | ICD-10-CM | POA: Diagnosis present

## 2021-10-03 DIAGNOSIS — I952 Hypotension due to drugs: Secondary | ICD-10-CM | POA: Diagnosis not present

## 2021-10-03 DIAGNOSIS — J90 Pleural effusion, not elsewhere classified: Secondary | ICD-10-CM | POA: Diagnosis not present

## 2021-10-03 DIAGNOSIS — Z6841 Body Mass Index (BMI) 40.0 and over, adult: Secondary | ICD-10-CM

## 2021-10-03 DIAGNOSIS — M109 Gout, unspecified: Secondary | ICD-10-CM | POA: Diagnosis present

## 2021-10-03 DIAGNOSIS — Z90710 Acquired absence of both cervix and uterus: Secondary | ICD-10-CM

## 2021-10-03 DIAGNOSIS — R7881 Bacteremia: Secondary | ICD-10-CM | POA: Diagnosis not present

## 2021-10-03 DIAGNOSIS — G9341 Metabolic encephalopathy: Secondary | ICD-10-CM | POA: Diagnosis not present

## 2021-10-03 DIAGNOSIS — D631 Anemia in chronic kidney disease: Secondary | ICD-10-CM | POA: Diagnosis present

## 2021-10-03 DIAGNOSIS — R0603 Acute respiratory distress: Secondary | ICD-10-CM

## 2021-10-03 DIAGNOSIS — Z91199 Patient's noncompliance with other medical treatment and regimen due to unspecified reason: Secondary | ICD-10-CM

## 2021-10-03 DIAGNOSIS — R4020 Unspecified coma: Secondary | ICD-10-CM | POA: Diagnosis not present

## 2021-10-03 DIAGNOSIS — G9340 Encephalopathy, unspecified: Secondary | ICD-10-CM | POA: Diagnosis not present

## 2021-10-03 DIAGNOSIS — G4733 Obstructive sleep apnea (adult) (pediatric): Secondary | ICD-10-CM | POA: Diagnosis present

## 2021-10-03 DIAGNOSIS — T40425A Adverse effect of tramadol, initial encounter: Secondary | ICD-10-CM | POA: Diagnosis not present

## 2021-10-03 DIAGNOSIS — W19XXXA Unspecified fall, initial encounter: Secondary | ICD-10-CM

## 2021-10-03 DIAGNOSIS — F419 Anxiety disorder, unspecified: Secondary | ICD-10-CM | POA: Diagnosis present

## 2021-10-03 DIAGNOSIS — I130 Hypertensive heart and chronic kidney disease with heart failure and stage 1 through stage 4 chronic kidney disease, or unspecified chronic kidney disease: Secondary | ICD-10-CM | POA: Diagnosis not present

## 2021-10-03 DIAGNOSIS — Z743 Need for continuous supervision: Secondary | ICD-10-CM | POA: Diagnosis not present

## 2021-10-03 DIAGNOSIS — Z794 Long term (current) use of insulin: Secondary | ICD-10-CM

## 2021-10-03 DIAGNOSIS — Z4682 Encounter for fitting and adjustment of non-vascular catheter: Secondary | ICD-10-CM | POA: Diagnosis not present

## 2021-10-03 DIAGNOSIS — R1310 Dysphagia, unspecified: Secondary | ICD-10-CM | POA: Diagnosis present

## 2021-10-03 DIAGNOSIS — J9690 Respiratory failure, unspecified, unspecified whether with hypoxia or hypercapnia: Secondary | ICD-10-CM | POA: Diagnosis not present

## 2021-10-03 DIAGNOSIS — Z8589 Personal history of malignant neoplasm of other organs and systems: Secondary | ICD-10-CM

## 2021-10-03 DIAGNOSIS — G8929 Other chronic pain: Secondary | ICD-10-CM | POA: Diagnosis present

## 2021-10-03 DIAGNOSIS — D6489 Other specified anemias: Secondary | ICD-10-CM | POA: Diagnosis not present

## 2021-10-03 DIAGNOSIS — Z79899 Other long term (current) drug therapy: Secondary | ICD-10-CM

## 2021-10-03 DIAGNOSIS — R5381 Other malaise: Secondary | ICD-10-CM | POA: Diagnosis not present

## 2021-10-03 DIAGNOSIS — E1122 Type 2 diabetes mellitus with diabetic chronic kidney disease: Secondary | ICD-10-CM | POA: Diagnosis not present

## 2021-10-03 DIAGNOSIS — R55 Syncope and collapse: Secondary | ICD-10-CM | POA: Diagnosis not present

## 2021-10-03 DIAGNOSIS — G928 Other toxic encephalopathy: Secondary | ICD-10-CM | POA: Diagnosis present

## 2021-10-03 DIAGNOSIS — J811 Chronic pulmonary edema: Secondary | ICD-10-CM | POA: Diagnosis not present

## 2021-10-03 DIAGNOSIS — B957 Other staphylococcus as the cause of diseases classified elsewhere: Secondary | ICD-10-CM | POA: Diagnosis not present

## 2021-10-03 DIAGNOSIS — E119 Type 2 diabetes mellitus without complications: Secondary | ICD-10-CM

## 2021-10-03 DIAGNOSIS — Z9911 Dependence on respirator [ventilator] status: Secondary | ICD-10-CM | POA: Diagnosis not present

## 2021-10-03 DIAGNOSIS — K219 Gastro-esophageal reflux disease without esophagitis: Secondary | ICD-10-CM | POA: Diagnosis not present

## 2021-10-03 DIAGNOSIS — R000 Tachycardia, unspecified: Secondary | ICD-10-CM | POA: Diagnosis not present

## 2021-10-03 DIAGNOSIS — Z825 Family history of asthma and other chronic lower respiratory diseases: Secondary | ICD-10-CM

## 2021-10-03 DIAGNOSIS — Z7982 Long term (current) use of aspirin: Secondary | ICD-10-CM

## 2021-10-03 DIAGNOSIS — R404 Transient alteration of awareness: Secondary | ICD-10-CM | POA: Diagnosis not present

## 2021-10-03 DIAGNOSIS — R410 Disorientation, unspecified: Principal | ICD-10-CM

## 2021-10-03 DIAGNOSIS — Z823 Family history of stroke: Secondary | ICD-10-CM

## 2021-10-03 DIAGNOSIS — Z800 Family history of malignant neoplasm of digestive organs: Secondary | ICD-10-CM

## 2021-10-03 DIAGNOSIS — J9601 Acute respiratory failure with hypoxia: Secondary | ICD-10-CM | POA: Diagnosis not present

## 2021-10-03 DIAGNOSIS — T4275XA Adverse effect of unspecified antiepileptic and sedative-hypnotic drugs, initial encounter: Secondary | ICD-10-CM | POA: Diagnosis present

## 2021-10-03 LAB — MRSA NEXT GEN BY PCR, NASAL: MRSA by PCR Next Gen: NOT DETECTED

## 2021-10-03 LAB — COMPREHENSIVE METABOLIC PANEL
ALT: 20 U/L (ref 0–44)
AST: 40 U/L (ref 15–41)
Albumin: 2.7 g/dL — ABNORMAL LOW (ref 3.5–5.0)
Alkaline Phosphatase: 70 U/L (ref 38–126)
Anion gap: 12 (ref 5–15)
BUN: 23 mg/dL (ref 8–23)
CO2: 20 mmol/L — ABNORMAL LOW (ref 22–32)
Calcium: 8.4 mg/dL — ABNORMAL LOW (ref 8.9–10.3)
Chloride: 117 mmol/L — ABNORMAL HIGH (ref 98–111)
Creatinine, Ser: 1.67 mg/dL — ABNORMAL HIGH (ref 0.44–1.00)
GFR, Estimated: 32 mL/min — ABNORMAL LOW (ref >60)
Glucose, Bld: 151 mg/dL — ABNORMAL HIGH (ref 70–99)
Potassium: 3.9 mmol/L (ref 3.5–5.1)
Sodium: 149 mmol/L — ABNORMAL HIGH (ref 135–145)
Total Bilirubin: 1.7 mg/dL — ABNORMAL HIGH (ref 0.3–1.2)
Total Protein: 5.3 g/dL — ABNORMAL LOW (ref 6.5–8.1)

## 2021-10-03 LAB — URINALYSIS, ROUTINE W REFLEX MICROSCOPIC
Bilirubin Urine: NEGATIVE
Bilirubin Urine: NEGATIVE
Glucose, UA: NEGATIVE mg/dL
Glucose, UA: NEGATIVE mg/dL
Ketones, ur: NEGATIVE mg/dL
Ketones, ur: NEGATIVE mg/dL
Leukocytes,Ua: NEGATIVE
Leukocytes,Ua: NEGATIVE
Nitrite: NEGATIVE
Nitrite: NEGATIVE
Protein, ur: >=300 mg/dL — AB
Protein, ur: 100 mg/dL — AB
Specific Gravity, Urine: 1.015 (ref 1.005–1.030)
Specific Gravity, Urine: 1.008 (ref 1.005–1.030)
pH: 5 (ref 5.0–8.0)
pH: 5 (ref 5.0–8.0)

## 2021-10-03 LAB — I-STAT VENOUS BLOOD GAS, ED
Acid-base deficit: 3 mmol/L — ABNORMAL HIGH (ref 0.0–2.0)
Bicarbonate: 22 mmol/L (ref 20.0–28.0)
Calcium, Ion: 1.13 mmol/L — ABNORMAL LOW (ref 1.15–1.40)
HCT: 29 % — ABNORMAL LOW (ref 36.0–46.0)
Hemoglobin: 9.9 g/dL — ABNORMAL LOW (ref 12.0–15.0)
O2 Saturation: 100 %
Potassium: 3.9 mmol/L (ref 3.5–5.1)
Sodium: 150 mmol/L — ABNORMAL HIGH (ref 135–145)
TCO2: 23 mmol/L (ref 22–32)
pCO2, Ven: 36.7 mmHg — ABNORMAL LOW (ref 44–60)
pH, Ven: 7.386 (ref 7.25–7.43)
pO2, Ven: 171 mmHg — ABNORMAL HIGH (ref 32–45)

## 2021-10-03 LAB — I-STAT CHEM 8, ED
BUN: 20 mg/dL (ref 8–23)
Calcium, Ion: 1.13 mmol/L — ABNORMAL LOW (ref 1.15–1.40)
Chloride: 116 mmol/L — ABNORMAL HIGH (ref 98–111)
Creatinine, Ser: 1.6 mg/dL — ABNORMAL HIGH (ref 0.44–1.00)
Glucose, Bld: 145 mg/dL — ABNORMAL HIGH (ref 70–99)
HCT: 30 % — ABNORMAL LOW (ref 36.0–46.0)
Hemoglobin: 10.2 g/dL — ABNORMAL LOW (ref 12.0–15.0)
Potassium: 3.9 mmol/L (ref 3.5–5.1)
Sodium: 149 mmol/L — ABNORMAL HIGH (ref 135–145)
TCO2: 21 mmol/L — ABNORMAL LOW (ref 22–32)

## 2021-10-03 LAB — ECHOCARDIOGRAM COMPLETE
Area-P 1/2: 2.56 cm2
Height: 61 in
P 1/2 time: 561 msec
S' Lateral: 2.8 cm
Weight: 3488.56 oz

## 2021-10-03 LAB — POCT I-STAT 7, (LYTES, BLD GAS, ICA,H+H)
Acid-base deficit: 2 mmol/L (ref 0.0–2.0)
Bicarbonate: 22.7 mmol/L (ref 20.0–28.0)
Calcium, Ion: 1.16 mmol/L (ref 1.15–1.40)
HCT: 26 % — ABNORMAL LOW (ref 36.0–46.0)
Hemoglobin: 8.8 g/dL — ABNORMAL LOW (ref 12.0–15.0)
O2 Saturation: 100 %
Patient temperature: 96.4
Potassium: 3.6 mmol/L (ref 3.5–5.1)
Sodium: 148 mmol/L — ABNORMAL HIGH (ref 135–145)
TCO2: 24 mmol/L (ref 22–32)
pCO2 arterial: 33.2 mmHg (ref 32–48)
pH, Arterial: 7.437 (ref 7.35–7.45)
pO2, Arterial: 313 mmHg — ABNORMAL HIGH (ref 83–108)

## 2021-10-03 LAB — PROCALCITONIN: Procalcitonin: 0.1 ng/mL

## 2021-10-03 LAB — CBC WITH DIFFERENTIAL/PLATELET
Abs Immature Granulocytes: 0.02 10*3/uL (ref 0.00–0.07)
Basophils Absolute: 0.1 10*3/uL (ref 0.0–0.1)
Basophils Relative: 1 %
Eosinophils Absolute: 0.1 10*3/uL (ref 0.0–0.5)
Eosinophils Relative: 1 %
HCT: 32 % — ABNORMAL LOW (ref 36.0–46.0)
Hemoglobin: 10.9 g/dL — ABNORMAL LOW (ref 12.0–15.0)
Immature Granulocytes: 0 %
Lymphocytes Relative: 19 %
Lymphs Abs: 1.3 10*3/uL (ref 0.7–4.0)
MCH: 32.4 pg (ref 26.0–34.0)
MCHC: 34.1 g/dL (ref 30.0–36.0)
MCV: 95.2 fL (ref 80.0–100.0)
Monocytes Absolute: 0.7 10*3/uL (ref 0.1–1.0)
Monocytes Relative: 10 %
Neutro Abs: 4.7 10*3/uL (ref 1.7–7.7)
Neutrophils Relative %: 69 %
Platelets: 159 10*3/uL (ref 150–400)
RBC: 3.36 MIL/uL — ABNORMAL LOW (ref 3.87–5.11)
RDW: 14.4 % (ref 11.5–15.5)
WBC: 6.8 10*3/uL (ref 4.0–10.5)
nRBC: 0 % (ref 0.0–0.2)

## 2021-10-03 LAB — GLUCOSE, CAPILLARY
Glucose-Capillary: 96 mg/dL (ref 70–99)
Glucose-Capillary: 92 mg/dL (ref 70–99)
Glucose-Capillary: 152 mg/dL — ABNORMAL HIGH (ref 70–99)
Glucose-Capillary: 161 mg/dL — ABNORMAL HIGH (ref 70–99)

## 2021-10-03 LAB — RAPID URINE DRUG SCREEN, HOSP PERFORMED
Amphetamines: NOT DETECTED
Barbiturates: NOT DETECTED
Benzodiazepines: NOT DETECTED
Cocaine: NOT DETECTED
Opiates: NOT DETECTED
Tetrahydrocannabinol: NOT DETECTED

## 2021-10-03 LAB — HEMOGLOBIN A1C
Hgb A1c MFr Bld: 5.7 % — ABNORMAL HIGH (ref 4.8–5.6)
Mean Plasma Glucose: 116.89 mg/dL

## 2021-10-03 LAB — PHOSPHORUS
Phosphorus: 2.7 mg/dL (ref 2.5–4.6)
Phosphorus: 2.7 mg/dL (ref 2.5–4.6)

## 2021-10-03 LAB — STREP PNEUMONIAE URINARY ANTIGEN: Strep Pneumo Urinary Antigen: NEGATIVE

## 2021-10-03 LAB — MAGNESIUM
Magnesium: 1.5 mg/dL — ABNORMAL LOW (ref 1.7–2.4)
Magnesium: 1.6 mg/dL — ABNORMAL LOW (ref 1.7–2.4)

## 2021-10-03 LAB — CBG MONITORING, ED: Glucose-Capillary: 135 mg/dL — ABNORMAL HIGH (ref 70–99)

## 2021-10-03 LAB — LACTIC ACID, PLASMA
Lactic Acid, Venous: 2.2 mmol/L (ref 0.5–1.9)
Lactic Acid, Venous: 1.5 mmol/L (ref 0.5–1.9)
Lactic Acid, Venous: 1.2 mmol/L (ref 0.5–1.9)

## 2021-10-03 LAB — SALICYLATE LEVEL: Salicylate Lvl: <7 mg/dL — ABNORMAL LOW (ref 7.0–30.0)

## 2021-10-03 LAB — BRAIN NATRIURETIC PEPTIDE: B Natriuretic Peptide: 706.2 pg/mL — ABNORMAL HIGH (ref 0.0–100.0)

## 2021-10-03 LAB — ACETAMINOPHEN LEVEL: Acetaminophen (Tylenol), Serum: <10 ug/mL — ABNORMAL LOW (ref 10–30)

## 2021-10-03 LAB — ETHANOL: Alcohol, Ethyl (B): <10 mg/dL (ref <10)

## 2021-10-03 MED ORDER — FUROSEMIDE 10 MG/ML IJ SOLN
40.0000 mg | Freq: Once | INTRAMUSCULAR | Status: AC
Start: 2021-10-03 — End: 2021-10-03
  Administered 2021-10-03: 40 mg via INTRAVENOUS
  Filled 2021-10-03: qty 4

## 2021-10-03 MED ORDER — ASPIRIN 81 MG PO CHEW
81.0000 mg | CHEWABLE_TABLET | Freq: Every day | ORAL | Status: DC
  Administered 2021-10-03 – 2021-10-04 (×2): 81 mg
  Filled 2021-10-03 (×2): qty 1

## 2021-10-03 MED ORDER — FENTANYL BOLUS VIA INFUSION
25.0000 ug | INTRAVENOUS | Status: DC | PRN
  Administered 2021-10-03: 100 ug via INTRAVENOUS
  Administered 2021-10-03: 50 ug via INTRAVENOUS

## 2021-10-03 MED ORDER — MAGNESIUM SULFATE 4 GM/100ML IV SOLN
4.0000 g | Freq: Once | INTRAVENOUS | Status: AC
  Administered 2021-10-03: 4 g via INTRAVENOUS
  Filled 2021-10-03: qty 100

## 2021-10-03 MED ORDER — FENTANYL BOLUS VIA INFUSION
25.0000 ug | INTRAVENOUS | Status: DC | PRN
  Administered 2021-10-03: 25 ug via INTRAVENOUS

## 2021-10-03 MED ORDER — SIMVASTATIN 20 MG PO TABS
20.0000 mg | ORAL_TABLET | Freq: Every evening | ORAL | Status: DC
  Administered 2021-10-03: 20 mg
  Filled 2021-10-03: qty 1

## 2021-10-03 MED ORDER — ONDANSETRON HCL 4 MG/2ML IJ SOLN: 4.0000 mg | Freq: Four times a day (QID) | INTRAMUSCULAR | Status: DC | PRN

## 2021-10-03 MED ORDER — FENTANYL 2500MCG IN NS 250ML (10MCG/ML) PREMIX INFUSION
25.0000 ug/h | INTRAVENOUS | Status: DC
  Administered 2021-10-03 – 2021-10-04 (×2): 100 ug/h via INTRAVENOUS
  Filled 2021-10-03: qty 250

## 2021-10-03 MED ORDER — NOREPINEPHRINE 4 MG/250ML-% IV SOLN
2.0000 ug/min | INTRAVENOUS | Status: DC
  Administered 2021-10-03: 2 ug/min via INTRAVENOUS
  Administered 2021-10-03: 8 ug/min via INTRAVENOUS
  Administered 2021-10-04: 10 ug/min via INTRAVENOUS
  Filled 2021-10-03 (×4): qty 250

## 2021-10-03 MED ORDER — VITAL HIGH PROTEIN PO LIQD
1000.0000 mL | ORAL | Status: DC
  Administered 2021-10-03: 1000 mL
  Filled 2021-10-03: qty 1000

## 2021-10-03 MED ORDER — FENTANYL 2500MCG IN NS 250ML (10MCG/ML) PREMIX INFUSION
25.0000 ug/h | INTRAVENOUS | Status: DC
  Administered 2021-10-03: 50 ug/h via INTRAVENOUS
  Filled 2021-10-03: qty 250

## 2021-10-03 MED ORDER — HEPARIN SODIUM (PORCINE) 5000 UNIT/ML IJ SOLN
5000.0000 [IU] | Freq: Three times a day (TID) | INTRAMUSCULAR | Status: DC
  Administered 2021-10-03 – 2021-10-09 (×19): 5000 [IU] via SUBCUTANEOUS
  Filled 2021-10-03 (×20): qty 1

## 2021-10-03 MED ORDER — ORAL CARE MOUTH RINSE: 15.0000 mL | OROMUCOSAL | Status: DC | PRN

## 2021-10-03 MED ORDER — LACTATED RINGERS IV BOLUS: 1000.0000 mL | Freq: Once | INTRAVENOUS | Status: AC

## 2021-10-03 MED ORDER — NALOXONE HCL 0.4 MG/ML IJ SOLN
0.4000 mg | Freq: Once | INTRAMUSCULAR | Status: AC
  Administered 2021-10-03: 0.4 mg via INTRAVENOUS
  Filled 2021-10-03: qty 1

## 2021-10-03 MED ORDER — SODIUM CHLORIDE 0.9 % IV SOLN
500.0000 mg | INTRAVENOUS | Status: DC
  Administered 2021-10-03 – 2021-10-04 (×2): 500 mg via INTRAVENOUS
  Filled 2021-10-03 (×2): qty 5

## 2021-10-03 MED ORDER — DEXMEDETOMIDINE HCL IN NACL 400 MCG/100ML IV SOLN
0.0000 ug/kg/h | INTRAVENOUS | Status: DC
  Administered 2021-10-03: 0.4 ug/kg/h via INTRAVENOUS
  Filled 2021-10-03: qty 100

## 2021-10-03 MED ORDER — FENOFIBRATE 160 MG PO TABS
160.0000 mg | ORAL_TABLET | Freq: Every morning | ORAL | Status: DC
Start: 2021-10-03 — End: 2021-10-04
  Administered 2021-10-04: 160 mg
  Filled 2021-10-03 (×2): qty 1

## 2021-10-03 MED ORDER — DOCUSATE SODIUM 100 MG PO CAPS: 100.0000 mg | ORAL_CAPSULE | Freq: Two times a day (BID) | ORAL | Status: DC | PRN

## 2021-10-03 MED ORDER — CHLORHEXIDINE GLUCONATE CLOTH 2 % EX PADS
6.0000 | MEDICATED_PAD | Freq: Every day | CUTANEOUS | Status: DC
  Administered 2021-10-03 – 2021-10-05 (×3): 6 via TOPICAL

## 2021-10-03 MED ORDER — PROPOFOL 1000 MG/100ML IV EMUL
5.0000 ug/kg/min | INTRAVENOUS | Status: DC
  Administered 2021-10-03: 5 ug/kg/min via INTRAVENOUS
  Filled 2021-10-03: qty 100

## 2021-10-03 MED ORDER — VITAL HIGH PROTEIN PO LIQD
1000.0000 mL | ORAL | Status: DC
  Filled 2021-10-03: qty 1000

## 2021-10-03 MED ORDER — PANTOPRAZOLE 2 MG/ML SUSPENSION: 40.0000 mg | Freq: Every day | ORAL | Status: DC

## 2021-10-03 MED ORDER — ETOMIDATE 2 MG/ML IV SOLN
INTRAVENOUS | Status: DC | PRN
  Administered 2021-10-03: 20 mg via INTRAVENOUS

## 2021-10-03 MED ORDER — SUCCINYLCHOLINE CHLORIDE 200 MG/10ML IV SOSY
PREFILLED_SYRINGE | INTRAVENOUS | Status: AC
  Filled 2021-10-03: qty 10

## 2021-10-03 MED ORDER — SODIUM CHLORIDE 0.9 % IV SOLN
2.0000 g | INTRAVENOUS | Status: DC
  Administered 2021-10-03 – 2021-10-04 (×2): 2 g via INTRAVENOUS
  Filled 2021-10-03 (×2): qty 20

## 2021-10-03 MED ORDER — SODIUM CHLORIDE 0.9 % IV SOLN: 250.0000 mL | INTRAVENOUS | Status: DC

## 2021-10-03 MED ORDER — PROSOURCE TF PO LIQD
45.0000 mL | Freq: Two times a day (BID) | ORAL | Status: DC
  Filled 2021-10-03: qty 45

## 2021-10-03 MED ORDER — ORAL CARE MOUTH RINSE
15.0000 mL | OROMUCOSAL | Status: DC
  Administered 2021-10-03 – 2021-10-04 (×16): 15 mL via OROMUCOSAL

## 2021-10-03 MED ORDER — SODIUM CHLORIDE 0.9 % IV SOLN: INTRAVENOUS | Status: DC | PRN

## 2021-10-03 MED ORDER — INSULIN ASPART 100 UNIT/ML IJ SOLN
0.0000 [IU] | INTRAMUSCULAR | Status: DC
  Administered 2021-10-03 – 2021-10-04 (×4): 3 [IU] via SUBCUTANEOUS
  Administered 2021-10-04: 2 [IU] via SUBCUTANEOUS
  Administered 2021-10-04 – 2021-10-05 (×3): 3 [IU] via SUBCUTANEOUS
  Administered 2021-10-05: 5 [IU] via SUBCUTANEOUS
  Administered 2021-10-05 (×2): 3 [IU] via SUBCUTANEOUS
  Administered 2021-10-06 (×2): 2 [IU] via SUBCUTANEOUS

## 2021-10-03 MED ORDER — POTASSIUM CHLORIDE 20 MEQ PO PACK
40.0000 meq | PACK | Freq: Once | ORAL | Status: AC
  Administered 2021-10-03: 40 meq
  Filled 2021-10-03: qty 2

## 2021-10-03 MED ORDER — DOCUSATE SODIUM 50 MG/5ML PO LIQD: 100.0000 mg | Freq: Two times a day (BID) | ORAL | Status: DC | PRN

## 2021-10-03 MED ORDER — SUCCINYLCHOLINE CHLORIDE 20 MG/ML IJ SOLN
INTRAMUSCULAR | Status: DC | PRN
  Administered 2021-10-03: 148.35 mg via INTRAVENOUS

## 2021-10-03 MED ORDER — POLYETHYLENE GLYCOL 3350 17 G PO PACK: 17.0000 g | PACK | Freq: Every day | ORAL | Status: DC | PRN

## 2021-10-03 MED ORDER — ETOMIDATE 2 MG/ML IV SOLN
INTRAVENOUS | Status: AC
  Filled 2021-10-03: qty 20

## 2021-10-03 MED ORDER — ROCURONIUM BROMIDE 10 MG/ML (PF) SYRINGE
PREFILLED_SYRINGE | INTRAVENOUS | Status: AC
  Filled 2021-10-03: qty 10

## 2021-10-03 MED ORDER — PANTOPRAZOLE 2 MG/ML SUSPENSION
40.0000 mg | Freq: Every day | ORAL | Status: DC
Start: 2021-10-03 — End: 2021-10-04
  Administered 2021-10-03 – 2021-10-04 (×2): 40 mg
  Filled 2021-10-03 (×3): qty 20

## 2021-10-03 NOTE — ED Triage Notes (Signed)
Pt arriving via [facility] from home, EMS found pt hunched over on the floor under her bed. Family was concerned that pt took extra dose of medication then normal. Pt was not following any commands upon arrival.   Respirations were 8 upon arrival, 0.4 of narcan given and Improved RR to 16  '4mg'$  of zofran administered in the field.  Bilateral 20g IV in wrists  Last Vitals:  95.4 ax. Temp HR 120s  CBG - 154  96 on RA  175/120

## 2021-10-03 NOTE — ED Notes (Signed)
Mittens placed at this time

## 2021-10-03 NOTE — ED Notes (Signed)
Pt placed on [place] per PA verbal order

## 2021-10-03 NOTE — Progress Notes (Signed)
Patient transported to CT and back to [facility] RNx2.

## 2021-10-03 NOTE — ED Provider Notes (Signed)
[facility] EMERGENCY DEPARTMENT Provider Note   CSN: [id number] Arrival date & time: 10/03/21  0109     History  Chief Complaint  Patient presents with   Altered Mental Status    Elizabeth Crosby is a 76 y.o. female who presents via [facility] from home after she was found hunched over on her knees by the side of her bed by her husband.  Her husband reported to EMS that he heard her get up like she was going to go to the bathroom but fell to her knees and when he came to her she was acting "as though she was drunk".  He was unable to get off the ground prompting EMS call.  Per EMS patient respiratory rate was 8 times per minute with improvement to 15 breaths/min after administration of 0.4 of Narcan, however no improvement in her mental status.  Given 4 of Zofran in route to IV.  Collateral history obtained from the patient's daughter [name] via telephone by this provider who states that her mother had similar presentation with encephalopathy in August of last year at which time she had a pneumonia.  Additionally has had history of similar reactions after taking tizanidine and "prescription strength Tylenol", suspect tramadol from med list.  I personally reviewed this patient's medical records.  She has history of type 2 diabetes, hypertension, GERD.  HPI     Home Medications Prior to Admission medications   Medication Sig Start Date End Date Taking? Authorizing Provider  acetaminophen (TYLENOL) 500 MG tablet Take 1 tablet (500 mg total) by mouth every 4 (four) hours as needed for moderate pain, headache or fever. 11/25/20   Pokhrel, Corrie Mckusick, MD  aspirin EC 81 MG tablet Take 81 mg by mouth daily. Swallow whole.    [provider]  Carboxymethylcellul-Glycerin (LUBRICATING EYE DROPS OP) Place 1 drop into both eyes daily as needed (irritation).    [provider]  cetirizine (ZYRTEC) 10 MG tablet Take 10 mg by mouth daily.     [provider]  diclofenac Sodium (VOLTAREN) 1 % GEL Apply 2 g topically 4 (four) times daily.    [provider]  fenofibrate 160 MG tablet Take 160 mg by mouth every morning.     [provider]  HUMALOG KWIKPEN 200 UNIT/ML KwikPen Inject 10 Units into the skin daily with supper. 11/18/19   [provider]  hydrocortisone cream 1 % Apply 1 application topically daily as needed for itching.    [provider]  insulin degludec (TRESIBA) 200 UNIT/ML FlexTouch Pen Inject 40 Units into the skin daily with breakfast.    [provider]  lipase/protease/amylase (CREON) 12000-38000 units CPEP capsule Take by mouth.    [provider]  loperamide (IMODIUM A-D) 2 MG tablet Take 4 mg by mouth as needed for diarrhea or loose stools.    [provider]  Melatonin 5 MG TABS Take 5-10 mg by mouth at bedtime as needed (sleep).    [provider]  metoprolol tartrate (LOPRESSOR) 25 MG tablet Take 25 mg by mouth 2 (two) times daily.    [provider]  omeprazole (PRILOSEC) 20 MG capsule Take 20 mg by mouth daily. 08/19/18   [provider]  prochlorperazine (COMPAZINE) 10 MG tablet Take 1 tablet (10 mg total) by mouth every 6 (six) hours as needed for nausea or vomiting. 12/18/17   Gery Pray, MD  simvastatin (ZOCOR) 20 MG tablet Take 20 mg by  mouth every evening.    [provider]  telmisartan-hydrochlorothiazide (MICARDIS HCT) 80-25 MG tablet Take 1 tablet by mouth daily. 01/06/18   [provider]  traMADol (ULTRAM) 50 MG tablet Take 1 tablet (50 mg total) by mouth every 12 (twelve) hours as needed for severe pain or moderate pain. 11/25/20 11/25/21  Pokhrel, Corrie Mckusick, MD  vancomycin (VANCOCIN) 125 MG capsule Take 125 mg by mouth 4 (four) times daily.    [provider]      Allergies    Ozempic (0.25 or 0.5 mg-dose) [semaglutide(0.25 or 0.'5mg'$ -dos)], Amoxicillin, Canagliflozin, Dulaglutide, Lipitor  [atorvastatin], Metformin, Other, Pregabalin er, Sulfa antibiotics, and Potassium-containing compounds    Review of Systems   Review of Systems  Unable to perform ROS: Mental status change    Physical Exam Updated Vital Signs BP (!) 142/125   Pulse 95   Temp (!) 96.1 F (35.6 C)   Resp 20   Ht '5\' 1"'$  (1.549 m)   Wt 98.9 kg   SpO2 100%   BMI 41.20 kg/m  Physical Exam Vitals and nursing note reviewed.  Constitutional:      Appearance: She is obese. She is toxic-appearing. She is not ill-appearing.     Comments: Rectal temperature 95 F, patient covered in warm blankets.  HENT:     Head: Normocephalic and atraumatic.     Mouth/Throat:     Mouth: Mucous membranes are dry.     Pharynx: No oropharyngeal exudate or posterior oropharyngeal erythema.  Eyes:     General: Lids are normal.        Right eye: No discharge.        Left eye: No discharge.     Conjunctiva/sclera: Conjunctivae normal.     Pupils: Pupils are equal, round, and reactive to light.  Neck:     Trachea: Trachea normal.  Cardiovascular:     Rate and Rhythm: Regular rhythm. Tachycardia present.     Pulses:          Dorsalis pedis pulses are 1+ on the right side and 1+ on the left side.     Heart sounds: Normal heart sounds. No murmur heard. Pulmonary:     Effort: Tachypnea, accessory muscle usage and respiratory distress present.     Breath sounds: Examination of the left-middle field reveals rales. Examination of the right-lower field reveals rales. Examination of the left-lower field reveals rales. Rales present. No wheezing.  Chest:     Chest wall: No mass, lacerations, deformity, swelling, tenderness, crepitus or edema.  Abdominal:     General: Bowel sounds are normal. There is no distension.     Palpations: Abdomen is soft.     Tenderness: There is no abdominal tenderness.  Musculoskeletal:        General: No deformity.     Cervical back: Neck supple.     Right lower leg: 2+ Pitting Edema present.      Left lower leg: 2+ Pitting Edema present.  Skin:    General: Skin is cool and dry.     Capillary Refill: Capillary refill takes 2 to 3 seconds.       Neurological:     Mental Status: She is lethargic and disoriented.     GCS: GCS eye subscore is 4. GCS verbal subscore is 1. GCS motor subscore is 4.     Comments: Patient not currently responding to any verbal interaction, cries out with painful stimuli with sternal rub moving all extremities spontaneously without purposeful movement  Psychiatric:        Mood and Affect: Mood normal.        Behavior: Behavior is uncooperative.     ED Results / Procedures / Treatments   Labs (all labs ordered are listed, but only abnormal results are displayed) Labs Reviewed  COMPREHENSIVE METABOLIC PANEL - Abnormal; Notable for the following components:      Result Value   Sodium 149 (*)    Chloride 117 (*)    CO2 20 (*)    Glucose, Bld 151 (*)    Creatinine, Ser 1.67 (*)    Calcium 8.4 (*)    Total Protein 5.3 (*)    Albumin 2.7 (*)    Total Bilirubin 1.7 (*)    GFR, Estimated 32 (*)    All other components within normal limits  CBC WITH DIFFERENTIAL/PLATELET - Abnormal; Notable for the following components:   RBC 3.36 (*)    Hemoglobin 10.9 (*)    HCT 32.0 (*)    All other components within normal limits  URINALYSIS, ROUTINE W REFLEX MICROSCOPIC - Abnormal; Notable for the following components:   Color, Urine AMBER (*)    APPearance HAZY (*)    Hgb urine dipstick MODERATE (*)    Protein, ur >=300 (*)    Bacteria, UA MANY (*)    All other components within normal limits  LACTIC ACID, PLASMA - Abnormal; Notable for the following components:   Lactic Acid, Venous 2.2 (*)    All other components within normal limits  ACETAMINOPHEN LEVEL - Abnormal; Notable for the following components:   Acetaminophen (Tylenol), Serum <10 (*)    All other components within normal limits  SALICYLATE LEVEL - Abnormal; Notable for the following components:    Salicylate Lvl <5.7 (*)    All other components within normal limits  CBG MONITORING, ED - Abnormal; Notable for the following components:   Glucose-Capillary 135 (*)    All other components within normal limits  I-STAT VENOUS BLOOD GAS, ED - Abnormal; Notable for the following components:   pCO2, Ven 36.7 (*)    pO2, Ven 171 (*)    Acid-base deficit 3.0 (*)    Sodium 150 (*)    Calcium, Ion 1.13 (*)    HCT 29.0 (*)    Hemoglobin 9.9 (*)    All other components within normal limits  I-STAT CHEM 8, ED - Abnormal; Notable for the following components:   Sodium 149 (*)    Chloride 116 (*)    Creatinine, Ser 1.60 (*)    Glucose, Bld 145 (*)    Calcium, Ion 1.13 (*)    TCO2 21 (*)    Hemoglobin 10.2 (*)    HCT 30.0 (*)    All other components within normal limits  CULTURE, BLOOD (ROUTINE X 2)  CULTURE, BLOOD (ROUTINE X 2)  RAPID URINE DRUG SCREEN, HOSP PERFORMED  ETHANOL  BLOOD GAS, ARTERIAL  BRAIN NATRIURETIC PEPTIDE  CBG MONITORING, ED    EKG EKG Interpretation  Date/Time:  Wednesday October 03 2021 05:38:49 EDT Ventricular Rate:  99 PR Interval:  136 QRS Duration: 140 QT Interval:  359 QTC Calculation: 461 R Axis:   -79 Text Interpretation: Sinus tachycardia RBBB and LAFB Interpretation limited secondary to artifact Confirmed by Ripley Fraise ([id number]) on 10/03/2021 5:44:46 AM  Radiology DG Chest Port 1 View  Result Date: 10/03/2021 CLINICAL DATA:  Status post intubation. EXAM: PORTABLE CHEST 1 VIEW COMPARISON:  Earlier today FINDINGS: The ET tube has been  retracted and is now 1.9 cm above the carina. Stable cardiac enlargement. Unchanged bilateral pleural effusions and interstitial edema. IMPRESSION: Interval retraction of endotracheal tube with tip now 1.9 cm above the carina. Electronically Signed   By: Kerby Moors M.D.   On: 10/03/2021 07:02   DG Chest Port 1 View  Result Date: 10/03/2021 CLINICAL DATA:  Status post ET tube placement. EXAM: PORTABLE CHEST 1 VIEW  COMPARISON:  10/03/2021 FINDINGS: ETT tip in satisfactory position just above the carina. There is an enteric tube with tip and side port coursing below the level of the imaged diaphragms. Stable cardiac silhouette. Bilateral pleural effusions and mild interstitial edema identified, slightly improved from the prior exam. No new findings. IMPRESSION: 1. Satisfactory position of ETT. 2. Slight improvement in pleural effusions and interstitial edema. Electronically Signed   By: Kerby Moors M.D.   On: 10/03/2021 07:02   DG Pelvis Portable  Result Date: 10/03/2021 CLINICAL DATA:  76 year old female with altered level of consciousness. EXAM: PORTABLE PELVIS 1-2 VIEWS COMPARISON:  CT Abdomen and Pelvis 09/22/2017. FINDINGS: Portable AP supine view at 0545 hours. Large body habitus, suboptimal radiographic technique. Femoral heads appear to be normally located. No pelvis fracture or dislocation is evident. Negative visible bowel gas pattern. IMPRESSION: Suboptimal due to body habitus/technique. No radiographic abnormality identified. Electronically Signed   By: Genevie Ann M.D.   On: 10/03/2021 06:07   DG Chest Port 1 View  Result Date: 10/03/2021 CLINICAL DATA:  76 year old female with altered level of consciousness. EXAM: PORTABLE CHEST 1 VIEW COMPARISON:  Chest radiographs 12/13/2020 and earlier. FINDINGS: Portable AP semi upright view at 0541 hours. Lower lung volumes. Increased perihilar and basilar predominant pulmonary interstitial markings and failing bibasilar lung opacity obscuring the diaphragm. No superimposed pneumothorax. No air bronchograms identified. Mediastinal contours appear stable, within normal limits. No acute osseous abnormality identified. Paucity of bowel gas in the upper abdomen. IMPRESSION: Lower lung volumes with evidence of acute pulmonary edema and small pleural effusions. Electronically Signed   By: Genevie Ann M.D.   On: 10/03/2021 06:06    Procedures .Critical Care  Performed by:  Emeline Darling, PA-C Authorized by: Emeline Darling, PA-C   Critical care provider statement:    Critical care time (minutes):  45   Critical care was time spent personally by me on the following activities:  Development of treatment plan with patient or surrogate, discussions with consultants, evaluation of patient's response to treatment, examination of patient, obtaining history from patient or surrogate, ordering and performing treatments and interventions, ordering and review of laboratory studies, ordering and review of radiographic studies, pulse oximetry and re-evaluation of patient's condition     Medications Ordered in ED Medications  etomidate (AMIDATE) injection ( Intravenous Canceled Entry 10/03/21 0630)  succinylcholine (ANECTINE) injection ( Intravenous Canceled Entry 10/03/21 0630)  propofol (DIPRIVAN) 1000 MG/100ML infusion (15 mcg/kg/min  98.9 kg Intravenous Rate/Dose Change 10/03/21 0719)  naloxone [facility]) injection 0.4 mg (0.4 mg Intravenous Given 10/03/21 0534)    ED Course/ Medical Decision Making/ A&P Clinical Course as of 10/03/21 0723  Wed Oct 03, 2021  0716 Consult to [place], who is agreeable to admitting this patient to her service. I appreciate her collaboration in the care of this patient.  [RS]    Clinical Course User Index [RS] Alick Lecomte, Gypsy Balsam, PA-C  Medical Decision Making 76 year old female presents via EMS for altered level of consciousness.  Hypothermic on intake with rectal temperature of 95, placed on Bair hugger, tachycardic, mildly elevated diastolic BP.  Tachypneic.  Significantly increased work of breathing with some rales in the bases, mild abrasions but otherwise no acute deformity of the extremities.  The differential diagnosis for AMS is extensive and includes, but is not limited to:  Drug overdose - opioids, alcohol, sedatives, antipsychotics, drug withdrawal, others Metabolic: hypoxia,  hypoglycemia, hyperglycemia, hypercalcemia, hypernatremia, hyponatremia, uremia, hepatic encephalopathy, hypothyroidism, hyperthyroidism, vitamin B12 or thiamine deficiency, carbon monoxide poisoning, Wilson's disease, Lactic acidosis, DKA/HHOS Infectious: meningitis, encephalitis, bacteremia/sepsis, urinary tract infection, pneumonia, neurosyphilis Structural: Space-occupying lesion, (brain tumor, subdural hematoma, hydrocephalus,) Vascular: stroke, subarachnoid hemorrhage, coronary ischemia, hypertensive encephalopathy, CNS vasculitis, thrombotic thrombocytopenic purpura, disseminated intravascular coagulation, hyperviscosity Psychiatric: Schizophrenia, depression; Other: Seizure, hypothermia, heat stroke, ICU psychosis, dementia -"sundowning."    Amount and/or Complexity of Data Reviewed Labs: ordered.    Details: CBC without leukocytosis and with mild anemia with hemoglobin of 10.9 near patient's baseline.  CMP with mildly elevated sodium to 149, AKI with creatinine 1.6 elevated from patient's baseline of 1.1.  Acetaminophen and salicylate, and ethanol levels are all normal.  UA with many bacteria, greater than 3 and protein, moderate hemoglobin, UDS pan negative.  I-STAT ABG without acidosis or hypercapnia.  Lactic acid is 2.2 Radiology: ordered.    Details: Chest x-ray with pulmonary edema and small pleural effusions bilaterally, pelvic x-ray without acute osseous abnormality identified. ECG/medicine tests:     Details: EKG with sinus tachycardia.  Risk Prescription drug management.   GCS of 9 on intake, patient with significantly increased work of breathing, appears to be fatiguing.  Decision was made with attending physician Dr. Christy Gentles to proceed with securing the patient's airway with intubation.  Please see his associated note for intubation documentation.  Patient will require admission to the hospital for further management and stabilization.  Patient currently intubated on  ventilator with propofol for sedation.  Consult to critical care medicine NP Brandi as above.  Patient has been administered her service.  I appreciate her collaboration in care of this patient.  This chart was dictated using voice recognition software, Dragon. Despite the best efforts of this provider to proofread and correct errors, errors may still occur which can change documentation meaning.  Final Clinical Impression(s) / ED Diagnoses Final diagnoses:  Delirium  Respiratory distress    Rx / DC Orders ED Discharge Orders     None         Aura Dials 10/03/21 0724    Ripley Fraise, MD 10/03/21 [phone]

## 2021-10-03 NOTE — H&P (Signed)
NAME:  Elizabeth Crosby, MRN:  [id number], DOB:  1945-05-28, LOS: 0 ADMISSION DATE:  10/03/2021, CONSULTATION DATE:  7/5 REFERRING MD: Dr. Christy Gentles, CHIEF COMPLAINT:  AMS, Respiratory Distress    History of Present Illness:  76 y/o F who presented to [facility] ER on 7/5 with reports of altered mental status and respiratory distress.   The patient presented to [facility] via EMS.  Her husband reports she got up in the middle of the night to go to the restroom and he heard her fall.  He reported she acted as if she was "drunk" but no ETOH suspected. He found her on her knees.  She takes tramadol and tizanidineand has gotten her meds mixed up in the past and taken too much on accident.  On presentation to the ER, her GCS was 8 and she had respiratory distress. She was intubated for airway protection - reported to have desaturated with RSI, 2 attempts.  Initial labs - Na 149, K 3.9, Cl 116, CO2 20, glucose 151, BUN 23, Sr Cr 1.67, alk phos 70, WBC 6.8, Hgb 10.9, and platelets 159. ETOH <10, UDS negative. CXR showed placement of ETT, R>L interstitial edema, effusions.  CT Head negative for acute process. BNP pending.    PCCM called for ICU admission.   Daughter, Elizabeth Crosby [phone]), reports she spoke to her mother on 7/3 and she was in her usual state of health. She texted with her on 7/4 and she complained of feeling tired and of her chronic knee pain.  [name] states she occasionally will get her meds mixed up and she notes the patient to have slurring of her words.  The patient typically avoids going to the MD if possible.  She was recently seen by her PCP for routine check up and had a good report.  She notes that her "pancreas doesn't work well and she was recently started on meds to help".    Pertinent  Medical History  CKD III  DM  GERD Anxiety  Glaucoma  Gout  HLD HTN  OSA - nonadherent to CPAP  Difficulty with Anesthesia  Appendectomy Cholecystectomy  Total Hysterectomy   Omentectomy  Significant Hospital Events: Including procedures, antibiotic start and stop dates in addition to other pertinent events   7/5 Admit with AMS, respiratory distress, GCS 8. Intubated in ER.   Interim History / Subjective:  As above   Objective   Blood pressure (!) 142/125, pulse 95, temperature (!) 96.1 F (35.6 C), resp. rate 20, height _0  (1.549 m), weight 98.9 kg, SpO2 100 %.    Vent Mode: PRVC FiO2 (%):  [100 %] 100 % Set Rate:  [24 bmp] 24 bmp Vt Set:  [380 mL] 380 mL PEEP:  [5 cmH20] 5 cmH20 Plateau Pressure:  [18 cmH20] 18 cmH20  No intake or output data in the 24 hours ending 10/03/21 0722 Filed Weights   10/03/21 0517  Weight: 98.9 kg    Examination: General: elderly adult female lying in bed on vent in NAD HENT: MM pink/moist, ETT, pupils equal/reactive, anicteric  Lungs: non-labored on vent but dyssynchronous due to mild agitation, coarse rhonchi bilaterally.  [place] assessment of pleural space with R>L effusions Cardiovascular: s1s2 RRR, ST on monitor, SEM LSB (difficult to hear due to lung sounds) Abdomen: soft, bsx4 active  Extremities: warm/dry, BLE changes consistent with chronic venous insufficiency  Neuro: on fentanyl, opens eyes to voice, moving all extremities spontaneously, mittens in place, does not follow commands   Resolved  Hospital Problem list     Assessment & Plan:   Acute Metabolic Encephalopathy  In setting of suspected accidental medication mix up, possible PNA. CT head/neck negative for acute process.  -PAD protocol with precedex, fentanyl. Note hypotension with propofol in ER.  GCS 8 on presentation. Hx of similar admission in August.  UDS, ETOH negative. -RASS Goal 0 to -1  -minimize sedating medications as able to allow for neuro exam  -early PT efforts as able  -assess for infectious process   Acute Hypoxic Respiratory Failure in setting of Bilateral Infiltrates vs Effusions R>L OSA  Infectious vs edema with effusions.  Not compliant with CPAP at home.  -PRVC 8cc/kg -wean PEEP / fiO2 for sats > 90% -follow intermittent CXR  -await ABG once patient synchronous on vent  -daily SBT / WUA -lasix 40 mg IV x1 -empiric ceftriaxone, azithromycin.  Multiple allergies noted.  -assess strep antigen, BC, PCT  -if no response in effusions with diuretics, consider thoracentesis for sampling   AKI on CKD III Baseline sr cr ~ 1.1-1.2 -Trend BMP / urinary output -Replace electrolytes as indicated -Avoid nephrotoxic agents, ensure adequate renal perfusion  Sedation Related Hypotension  Noted in ER on propofol. Improved with cessation of propofol.  -monitor BP trend with sedation  Mechanical Fall  CT head, neck negative for acute process  -follow up once mental status clears  Hx HTN, HLD, Grade I DD  ? Murmur on Exam  -assess ECHO -tele monitoring  -hold home lopressor, telmisartan-HCTZ for now  -continue ASA, fenofibrate, zocor  -lasix x1 as above   DM  -Q4 CBG  -SSI, moderate scale for now. May need to adjust pending glucose trend for goal 140-180 -assess A1c   Best Practice (right click and "Reselect all SmartList Selections" daily)  Diet/type: NPO DVT prophylaxis: prophylactic heparin  GI prophylaxis: PPI Lines: N/A Foley:  N/A Code Status:  full code Last date of multidisciplinary goals of care discussion: full code, confirmed with Elizabeth Crosby at bedside.   Labs   CBC: Recent Labs  Lab 10/03/21 0531 10/03/21 0611  WBC 6.8  --   NEUTROABS 4.7  --   HGB 10.9* 10.2*  9.9*  HCT 32.0* 30.0*  29.0*  MCV 95.2  --   PLT 159  --     Basic Metabolic Panel: Recent Labs  Lab 10/03/21 0531 10/03/21 0611  NA 149* 149*  150*  K 3.9 3.9  3.9  CL 117* 116*  CO2 20*  --   GLUCOSE 151* 145*  BUN 23 20  CREATININE 1.67* 1.60*  CALCIUM 8.4*  --    GFR: Estimated Creatinine Clearance: 32.7 mL/min (A) (by C-G formula based on SCr of 1.6 mg/dL (H)). Recent Labs  Lab 10/03/21 0531  10/03/21 0537  WBC 6.8  --   LATICACIDVEN  --  2.2*    Liver Function Tests: Recent Labs  Lab 10/03/21 0531  AST 40  ALT 20  ALKPHOS 70  BILITOT 1.7*  PROT 5.3*  ALBUMIN 2.7*   No results for input(s): "LIPASE", "AMYLASE" in the last 168 hours. No results for input(s): "AMMONIA" in the last 168 hours.  ABG    Component Value Date/Time   HCO3 22.0 10/03/2021 0611   TCO2 23 10/03/2021 0611   TCO2 21 (L) 10/03/2021 0611   ACIDBASEDEF 3.0 (H) 10/03/2021 0611   O2SAT 100 10/03/2021 0611     Coagulation Profile: No results for input(s): "INR", "PROTIME" in the last 168 hours.  Cardiac  Enzymes: No results for input(s): "CKTOTAL", "CKMB", "CKMBINDEX", "TROPONINI" in the last 168 hours.  HbA1C: Hgb A1c MFr Bld  Date/Time Value Ref Range Status  11/19/2020 05:03 AM 7.2 (H) 4.8 - 5.6 % Final    Comment:    (NOTE) Pre diabetes:          5.7%-6.4%  Diabetes:              >6.4%  Glycemic control for   <7.0% adults with diabetes   09/25/2017 03:28 PM 8.2 (H) 4.8 - 5.6 % Final    Comment:    (NOTE) Pre diabetes:          5.7%-6.4% Diabetes:              >6.4% Glycemic control for   <7.0% adults with diabetes     CBG: Recent Labs  Lab 10/03/21 0544  GLUCAP 135*    Review of Systems:   Unable to complete as patient is altered on mechanical ventilation. Information obtained from providers at bedside, chart review and family.   Past Medical History:  She,  has a past medical history of Anxiety, Arthritis, Cellulitis, Chronic kidney disease (CKD), stage III (moderate) ([place]), Complication of anesthesia, Diabetes mellitus without complication ([place]), GERD (gastroesophageal reflux disease), Glaucoma, Gout, History of blood transfusion, History of kidney stones, History of radiation therapy, Hyperlipidemia, Hypertension, Leukocytes in urine, Lumbar back pain, and Sleep apnea.   Surgical History:   Past Surgical History:  Procedure Laterality Date   APPENDECTOMY  1972    CATARACT EXTRACTION W/PHACO Right 01/10/2014   Procedure: CATARACT EXTRACTION PHACO AND INTRAOCULAR LENS PLACEMENT ([place]);  Surgeon: Tonny Branch, MD;  Location: [facility];  Service: Ophthalmology;  Laterality: Right;  CDE:5.78   CATARACT EXTRACTION W/PHACO Left 01/27/2014   Procedure: CATARACT EXTRACTION PHACO AND INTRAOCULAR LENS PLACEMENT LEFT EYE CDE=5.75;  Surgeon: Tonny Branch, MD;  Location: [facility];  Service: Ophthalmology;  Laterality: Left;   CHOLECYSTECTOMY  1972   COLONOSCOPY WITH PROPOFOL N/A 09/01/2012   Procedure: COLONOSCOPY WITH PROPOFOL;  Surgeon: Garlan Fair, MD;  Location: [facility];  Service: Endoscopy;  Laterality: N/A;   KIDNEY STONE SURGERY     OMENTECTOMY  09/30/2017   Procedure: OMENTECTOMY;  Surgeon: [name], MD;  Location: [facility];  Service: Gynecology;;   ROBOTIC ASSISTED TOTAL HYSTERECTOMY WITH BILATERAL SALPINGO OOPHERECTOMY Bilateral 09/30/2017   Procedure: ROBOTIC ASSISTED TOTAL HYSTERECTOMY WITH BILATERAL SALPINGO OOPHORECTOMY;  Surgeon: [name], MD;  Location: [facility];  Service: Gynecology;  Laterality: Bilateral;     Social History:   reports that she has never smoked. She has never used smokeless tobacco. She reports that she does not drink alcohol and does not use drugs.   Family History:  Her family history includes Asthma in her father; Colon cancer in her father; Stroke in her mother.   Allergies Allergies  Allergen Reactions   Ozempic (0.25 Or 0.5 Mg-Dose) [Semaglutide(0.25 Or 0.84m-Dos)] Nausea And Vomiting   Amoxicillin     Other reaction(s): cellulitis   Canagliflozin     Other reaction(s): UTI   Dulaglutide Other (See Comments)    GI Upset   Lipitor [Atorvastatin] Other (See Comments)    Leg cramps   Metformin Other (See Comments)    Diarrhea    Other Nausea And Vomiting    Pt states does not know drug but anesthesia drugs have made her have nausea and vomiting.    Pregabalin Er     Other reaction(s): dizziness  Sulfa Antibiotics      diarrhea   Potassium-Containing Compounds Rash     Home Medications  Prior to Admission medications   Medication Sig Start Date End Date Taking? Authorizing Provider  acetaminophen (TYLENOL) 500 MG tablet Take 1 tablet (500 mg total) by mouth every 4 (four) hours as needed for moderate pain, headache or fever. 11/25/20   Pokhrel, Corrie Mckusick, MD  aspirin EC 81 MG tablet Take 81 mg by mouth daily. Swallow whole.    [provider]  Carboxymethylcellul-Glycerin (LUBRICATING EYE DROPS OP) Place 1 drop into both eyes daily as needed (irritation).    [provider]  cetirizine (ZYRTEC) 10 MG tablet Take 10 mg by mouth daily.     [provider]  diclofenac Sodium (VOLTAREN) 1 % GEL Apply 2 g topically 4 (four) times daily.    [provider]  fenofibrate 160 MG tablet Take 160 mg by mouth every morning.     [provider]  HUMALOG KWIKPEN 200 UNIT/ML KwikPen Inject 10 Units into the skin daily with supper. 11/18/19   [provider]  hydrocortisone cream 1 % Apply 1 application topically daily as needed for itching.    [provider]  insulin degludec (TRESIBA) 200 UNIT/ML FlexTouch Pen Inject 40 Units into the skin daily with breakfast.    [provider]  lipase/protease/amylase (CREON) 12000-38000 units CPEP capsule Take by mouth.    [provider]  loperamide (IMODIUM A-D) 2 MG tablet Take 4 mg by mouth as needed for diarrhea or loose stools.    [provider]  Melatonin 5 MG TABS Take 5-10 mg by mouth at bedtime as needed (sleep).    [provider]  metoprolol tartrate (LOPRESSOR) 25 MG tablet Take 25 mg by mouth 2 (two) times daily.    [provider]  omeprazole (PRILOSEC) 20 MG capsule Take 20 mg by mouth daily. 08/19/18   [provider]  prochlorperazine (COMPAZINE) 10 MG tablet Take 1 tablet (10 mg total) by mouth every 6 (six) hours as needed for nausea or vomiting.  12/18/17   Gery Pray, MD  simvastatin (ZOCOR) 20 MG tablet Take 20 mg by mouth every evening.    [provider]  telmisartan-hydrochlorothiazide (MICARDIS HCT) 80-25 MG tablet Take 1 tablet by mouth daily. 01/06/18   [provider]  traMADol (ULTRAM) 50 MG tablet Take 1 tablet (50 mg total) by mouth every 12 (twelve) hours as needed for severe pain or moderate pain. 11/25/20 11/25/21  Pokhrel, Corrie Mckusick, MD  vancomycin (VANCOCIN) 125 MG capsule Take 125 mg by mouth 4 (four) times daily.    [provider]     Critical care time: 61 minutes     Noe Gens, MSN, APRN, NP-C, AGACNP-BC [facility] 10/03/2021, 7:22 AM   Please see [email or web address] for pager details.   From 7A-7P if no response, please call [phone] After hours, please call ELink [phone]

## 2021-10-03 NOTE — Progress Notes (Signed)
  Echocardiogram 2D Echocardiogram has been performed.  Elizabeth Crosby 10/03/2021, 2:06 PM

## 2021-10-03 NOTE — ED Provider Notes (Signed)
I was informed the patient is become hypotensive due to sedation.  Levophed will be ordered while awaiting admission.  Fentanyl has been ordered for sedation   Ripley Fraise, MD 10/03/21 [phone]

## 2021-10-03 NOTE — Progress Notes (Signed)
Initial Nutrition Assessment  DOCUMENTATION CODES:   Obesity unspecified  INTERVENTION:   Initiate tube feeding via OG tube: Vital High Protein at 60 ml/h (1440 ml per day)  Provides 1440 kcal, 125 gm protein, 1261 ml free water daily   NUTRITION DIAGNOSIS:   Inadequate oral intake related to inability to eat as evidenced by NPO status.  GOAL:   Patient will meet greater than or equal to 90% of their needs  MONITOR:   TF tolerance  REASON FOR ASSESSMENT:   Consult Enteral/tube feeding initiation and management  ASSESSMENT:   Pt with PMH of CKD III, DM, GERD, anxiety, gout, HLD, HTN, OSA nonadherent to CPAP admitted due to acute metabolic encephalopathy in setting of accidental medication and possible PNA.   Pt discussed during ICU rounds and with RN and MD. No family present.   7/5 intubated for resp distress   Medications reviewed and include: SSI, protonix  Precedex Mag sulfate x 1  Levophed @ 2 mcg   Labs reviewed: Na 148, magnesium: 1.5, phosphorus 2.5   16 F OG tube; per xray tip and side port below level of diaphragm    NUTRITION - FOCUSED PHYSICAL EXAM:  Flowsheet Row Most Recent Value  Orbital Region No depletion  Upper Arm Region No depletion  Thoracic and Lumbar Region No depletion  Buccal Region Unable to assess  Temple Region Severe depletion  Clavicle Bone Region No depletion  Clavicle and Acromion Bone Region Moderate depletion  Scapular Bone Region Moderate depletion  Dorsal Hand No depletion  Patellar Region No depletion  Anterior Thigh Region No depletion  Posterior Calf Region No depletion  Edema (RD Assessment) Severe  [BLE]  Hair Reviewed  Eyes Unable to assess  Mouth Unable to assess  Skin Reviewed  Nails Reviewed       Diet Order:   Diet Order             Diet NPO time specified  Diet effective now                   EDUCATION NEEDS:   Not appropriate for education at this time  Skin:  Skin Assessment:  Reviewed RN Assessment  Last BM:     Height:   Ht Readings from Last 1 Encounters:  10/03/21 '5\' 1"'$  (1.549 m)    Weight:   Wt Readings from Last 1 Encounters:  10/03/21 98.9 kg    BMI:  Body mass index is 41.2 kg/m.  Estimated Nutritional Needs:   Kcal:  1400  Protein:  95-115 grams  Fluid:  > 1.5 L/day  Lockie Pares., RD, LDN, CNSC See AMiON for contact information

## 2021-10-03 NOTE — ED Provider Notes (Signed)
Procedure Name: Intubation Date/Time: 10/03/2021 6:45 AM  Performed by: Ripley Fraise, MDPre-anesthesia Checklist: Patient identified and Patient being monitored Oxygen Delivery Method: Nasal cannula Preoxygenation: Pre-oxygenation with 100% oxygen Induction Type: Rapid sequence Laryngoscope Size: Glidescope Grade View: Grade I Tube size: 7.5 mm Number of attempts: 2 Airway Equipment and Method: Video-laryngoscopy Placement Confirmation: CO2 detector Secured at: 23 cm Tube secured with: ETT holder Comments: On initial attempt by  APP Sponseller, patient became immediately hypoxic which required [place] to utilize bag-valve-mask.  Patient responded to BVM and her pulse ox went above 90%.  On second attempt I was able to intubate patient without difficulty.        Ripley Fraise, MD 10/03/21 [phone]

## 2021-10-03 NOTE — ED Notes (Signed)
Propofol titrated per PA verbal order

## 2021-10-04 ENCOUNTER — Inpatient Hospital Stay (HOSPITAL_COMMUNITY): Payer: Medicare Other

## 2021-10-04 DIAGNOSIS — G9341 Metabolic encephalopathy: Secondary | ICD-10-CM | POA: Diagnosis not present

## 2021-10-04 DIAGNOSIS — R7881 Bacteremia: Secondary | ICD-10-CM | POA: Diagnosis not present

## 2021-10-04 DIAGNOSIS — N179 Acute kidney failure, unspecified: Secondary | ICD-10-CM

## 2021-10-04 DIAGNOSIS — J90 Pleural effusion, not elsewhere classified: Secondary | ICD-10-CM

## 2021-10-04 DIAGNOSIS — R410 Disorientation, unspecified: Principal | ICD-10-CM

## 2021-10-04 DIAGNOSIS — J9601 Acute respiratory failure with hypoxia: Secondary | ICD-10-CM | POA: Diagnosis not present

## 2021-10-04 DIAGNOSIS — R0603 Acute respiratory distress: Secondary | ICD-10-CM

## 2021-10-04 LAB — PROCALCITONIN: Procalcitonin: 0.27 ng/mL

## 2021-10-04 LAB — BLOOD CULTURE ID PANEL (REFLEXED) - BCID2

## 2021-10-04 LAB — CBC
HCT: 30.3 % — ABNORMAL LOW (ref 36.0–46.0)
Hemoglobin: 9.8 g/dL — ABNORMAL LOW (ref 12.0–15.0)
MCH: 31.8 pg (ref 26.0–34.0)
MCHC: 32.3 g/dL (ref 30.0–36.0)
MCV: 98.4 fL (ref 80.0–100.0)
Platelets: 153 10*3/uL (ref 150–400)
RBC: 3.08 MIL/uL — ABNORMAL LOW (ref 3.87–5.11)
RDW: 14.6 % (ref 11.5–15.5)
WBC: 13 10*3/uL — ABNORMAL HIGH (ref 4.0–10.5)
nRBC: 0 % (ref 0.0–0.2)

## 2021-10-04 LAB — GLUCOSE, CAPILLARY
Glucose-Capillary: 137 mg/dL — ABNORMAL HIGH (ref 70–99)
Glucose-Capillary: 152 mg/dL — ABNORMAL HIGH (ref 70–99)
Glucose-Capillary: 154 mg/dL — ABNORMAL HIGH (ref 70–99)
Glucose-Capillary: 164 mg/dL — ABNORMAL HIGH (ref 70–99)
Glucose-Capillary: 181 mg/dL — ABNORMAL HIGH (ref 70–99)
Glucose-Capillary: 187 mg/dL — ABNORMAL HIGH (ref 70–99)

## 2021-10-04 LAB — BASIC METABOLIC PANEL
Anion gap: 7 (ref 5–15)
BUN: 24 mg/dL — ABNORMAL HIGH (ref 8–23)
CO2: 21 mmol/L — ABNORMAL LOW (ref 22–32)
Calcium: 7.9 mg/dL — ABNORMAL LOW (ref 8.9–10.3)
Chloride: 117 mmol/L — ABNORMAL HIGH (ref 98–111)
Creatinine, Ser: 1.65 mg/dL — ABNORMAL HIGH (ref 0.44–1.00)
GFR, Estimated: 32 mL/min — ABNORMAL LOW (ref >60)
Glucose, Bld: 210 mg/dL — ABNORMAL HIGH (ref 70–99)
Potassium: 4.5 mmol/L (ref 3.5–5.1)
Sodium: 145 mmol/L (ref 135–145)

## 2021-10-04 LAB — PHOSPHORUS
Phosphorus: 3 mg/dL (ref 2.5–4.6)
Phosphorus: 3.1 mg/dL (ref 2.5–4.6)

## 2021-10-04 LAB — MAGNESIUM
Magnesium: 2.1 mg/dL (ref 1.7–2.4)
Magnesium: 2 mg/dL (ref 1.7–2.4)

## 2021-10-04 MED ORDER — CEFAZOLIN SODIUM-DEXTROSE 2-4 GM/100ML-% IV SOLN
2.0000 g | Freq: Three times a day (TID) | INTRAVENOUS | Status: DC
  Administered 2021-10-05 – 2021-10-08 (×10): 2 g via INTRAVENOUS
  Filled 2021-10-04 (×12): qty 100

## 2021-10-04 MED ORDER — POLYETHYLENE GLYCOL 3350 17 G PO PACK
17.0000 g | PACK | Freq: Every day | ORAL | Status: DC | PRN
  Administered 2021-10-06: 17 g via ORAL
  Filled 2021-10-04: qty 1

## 2021-10-04 MED ORDER — PANTOPRAZOLE SODIUM 40 MG PO TBEC
40.0000 mg | DELAYED_RELEASE_TABLET | Freq: Every day | ORAL | Status: DC
  Administered 2021-10-05 – 2021-10-08 (×4): 40 mg via ORAL
  Filled 2021-10-04 (×4): qty 1

## 2021-10-04 MED ORDER — VANCOMYCIN HCL 1500 MG/300ML IV SOLN
1500.0000 mg | Freq: Once | INTRAVENOUS | Status: AC
  Administered 2021-10-04: 1500 mg via INTRAVENOUS
  Filled 2021-10-04: qty 300

## 2021-10-04 MED ORDER — SIMVASTATIN 20 MG PO TABS
20.0000 mg | ORAL_TABLET | Freq: Every evening | ORAL | Status: DC
Start: 2021-10-04 — End: 2021-10-09
  Administered 2021-10-04 – 2021-10-08 (×5): 20 mg via ORAL
  Filled 2021-10-04 (×5): qty 1

## 2021-10-04 MED ORDER — ASPIRIN 81 MG PO CHEW
81.0000 mg | CHEWABLE_TABLET | Freq: Every day | ORAL | Status: DC
  Administered 2021-10-05 – 2021-10-06 (×2): 81 mg via ORAL
  Filled 2021-10-04 (×2): qty 1

## 2021-10-04 MED ORDER — ORAL CARE MOUTH RINSE
15.0000 mL | OROMUCOSAL | Status: DC
  Administered 2021-10-04 – 2021-10-08 (×13): 15 mL via OROMUCOSAL

## 2021-10-04 MED ORDER — ORAL CARE MOUTH RINSE
15.0000 mL | OROMUCOSAL | Status: DC | PRN
Start: 2021-10-04 — End: 2021-10-09

## 2021-10-04 MED ORDER — DOCUSATE SODIUM 100 MG PO CAPS
100.0000 mg | ORAL_CAPSULE | Freq: Two times a day (BID) | ORAL | Status: DC | PRN
Start: 2021-10-04 — End: 2021-10-09
  Administered 2021-10-06: 100 mg via ORAL
  Filled 2021-10-04: qty 1

## 2021-10-04 MED ORDER — FUROSEMIDE 10 MG/ML IJ SOLN
60.0000 mg | Freq: Once | INTRAMUSCULAR | Status: AC
  Administered 2021-10-04: 60 mg via INTRAVENOUS
  Filled 2021-10-04: qty 6

## 2021-10-04 NOTE — Evaluation (Signed)
Clinical/Bedside Swallow Evaluation Patient Details  Name: Elizabeth Crosby MRN: [id number] Date of Birth: 24-Dec-1945  Today's Date: 10/04/2021 Time: SLP Start Time (ACUTE ONLY): 17 SLP Stop Time (ACUTE ONLY): 5284 SLP Time Calculation (min) (ACUTE ONLY): 25 min  Past Medical History:  Past Medical History:  Diagnosis Date   Anxiety    Arthritis    "knees are bone on bone and i see dr. Tonita Cong for the cortisone injections "    Cellulitis    hx  of in the legs    Chronic kidney disease (CKD), stage III (moderate) ([place])    Complication of anesthesia    doesnt know the name of the anesthesia , but i was vomiting while i was still under    Diabetes mellitus without complication ([facility])    type 2    GERD (gastroesophageal reflux disease)    Glaucoma    Gout    History of blood transfusion    as child after accident, after childbirth/hemorroid surgery   History of kidney stones    History of radiation therapy    vaginal brachytherapy  11/27/2017-12/25/2017   Dr Gery Pray   Hyperlipidemia    Hypertension    Leukocytes in urine    Lumbar back pain    Sleep apnea    non adherant with cpap device    Past Surgical History:  Past Surgical History:  Procedure Laterality Date   APPENDECTOMY  1972   CATARACT EXTRACTION W/PHACO Right 01/10/2014   Procedure: CATARACT EXTRACTION PHACO AND INTRAOCULAR LENS PLACEMENT ([place]);  Surgeon: Tonny Branch, MD;  Location: [facility];  Service: Ophthalmology;  Laterality: Right;  CDE:5.78   CATARACT EXTRACTION W/PHACO Left 01/27/2014   Procedure: CATARACT EXTRACTION PHACO AND INTRAOCULAR LENS PLACEMENT LEFT EYE CDE=5.75;  Surgeon: Tonny Branch, MD;  Location: [facility];  Service: Ophthalmology;  Laterality: Left;   CHOLECYSTECTOMY  1972   COLONOSCOPY WITH PROPOFOL N/A 09/01/2012   Procedure: COLONOSCOPY WITH PROPOFOL;  Surgeon: Garlan Fair, MD;  [facility];  Service: Endoscopy;  Laterality: N/A;   KIDNEY STONE SURGERY     OMENTECTOMY  09/30/2017    Procedure: OMENTECTOMY;  Surgeon: [name], MD;  Location: [facility];  Service: Gynecology;;   ROBOTIC ASSISTED TOTAL HYSTERECTOMY WITH BILATERAL SALPINGO OOPHERECTOMY Bilateral 09/30/2017   Procedure: ROBOTIC ASSISTED TOTAL HYSTERECTOMY WITH BILATERAL SALPINGO OOPHORECTOMY;  Surgeon: [name], MD;  Location: [facility];  Service: Gynecology;  Laterality: Bilateral;   HPI:  Patient is a 76 y.o. female with PMH: DM, GERD anxiety, gout, glaucoma, HTN, OSA nonadherent with CPAP, CKD III. She presented to the hospital via EMS on 10/03/21 from home after husband heard her fall in middle of the night and she was acting as if "drunk" but no ETOH suspected. Husband had reported that she has gotten a couple of her meds mixed up in the past and taken too much by accident. In ER, GCS was 8 and she had respiratory distress. She was intubated for airway protection on 7/5. UDS was negative, CT head negative for acute intracranial process, CXR showed right greater than left interstitial edema, effusions. She was extubated 10/04/21 at 0929 to 2L [place]. RN concerned about patient's persistent coughing with PO's, prompting swallow eval order with SLP.    Assessment / Plan / Recommendation  Clinical Impression  Patient presents with clinical s/s of dysphagia as per this bedside/clinical swallow evaluation. As patient has persistent cough which she and spouse report has been going on for past 2-3  months, difficult to differentiate chronic cough related to PNA versus cough from penetration/aspiration of liquids. SLP observed patient with PO's of thin liquids (water), nectar thick liquids, solids and puree solids. She exhibited both immediate and delayed coughing with thin liquids but only delayed coughing with nectar thick liquids. No significant difficulty observed with puree solids but patient only ate a very tiny piece of saltine cracker before stopping and telling SLP she did not want to have anymore. Towards end of PO intake,  patient exhibiting s/s esophageal dysphagia with hiccups observed (patient with h/o GERD). Her spouse told SLP that some foods "give her indigestion" and she takes Tums antacid for this. SLP is recommending downgrade diet to Dys 2(fine chop), nectar thick liquids and plan to further assess next date, likely with objective swallow evaluation. SLP Visit Diagnosis: Dysphagia, unspecified (R13.10)    Aspiration Risk  Mild aspiration risk    Diet Recommendation Dysphagia 2 (Fine chop);Nectar-thick liquid   Liquid Administration via: Cup Medication Administration: Whole meds with puree Compensations: Minimize environmental distractions;Slow rate;Small sips/bites Postural Changes: Seated upright at 90 degrees;Remain upright for at least 30 minutes after po intake    Other  Recommendations Oral Care Recommendations: Oral care BID;Staff/trained caregiver to provide oral care Other Recommendations: Order thickener from pharmacy;Prohibited food (jello, ice cream, thin soups);Clarify dietary restrictions;Remove water pitcher;Have oral suction available    Recommendations for follow up therapy are one component of a multi-disciplinary discharge planning process, led by the attending physician.  Recommendations may be updated based on patient status, additional functional criteria and insurance authorization.  Follow up Recommendations Other (comment) (TBD)      Assistance Recommended at Discharge Frequent or constant Supervision/Assistance  Functional Status Assessment Patient has had a recent decline in their functional status and demonstrates the ability to make significant improvements in function in a reasonable and predictable amount of time.  Frequency and Duration min 2x/week  1 week       Prognosis Prognosis for Safe Diet Advancement: Good      Swallow Study   General Date of Onset: 10/04/21 HPI: Patient is a 76 y.o. female with PMH: DM, GERD anxiety, gout, glaucoma, HTN, OSA  nonadherent with CPAP, CKD III. She presented to the hospital via EMS on 10/03/21 from home after husband heard her fall in middle of the night and she was acting as if "drunk" but no ETOH suspected. Husband had reported that she has gotten a couple of her meds mixed up in the past and taken too much by accident. In ER, GCS was 8 and she had respiratory distress. She was intubated for airway protection on 7/5. UDS was negative, CT head negative for acute intracranial process, CXR showed right greater than left interstitial edema, effusions. She was extubated 10/04/21 at 0929 to 2L . RN concerned about patient's persistent coughing with PO's, prompting swallow eval order with SLP. Type of Study: Bedside Swallow Evaluation Previous Swallow Assessment: none found Diet Prior to this Study: Regular;Thin liquids Temperature Spikes Noted: No Respiratory Status: Room air History of Recent Intubation: Yes Length of Intubations (days): 1 days Date extubated: 10/04/21 Behavior/Cognition: Alert;Cooperative;Pleasant mood;Confused;Lethargic/Drowsy Oral Cavity Assessment: Within Functional Limits Oral Care Completed by SLP: Yes Oral Cavity - Dentition: Adequate natural dentition Vision: Functional for self-feeding Self-Feeding Abilities: Able to feed self;Needs set up Patient Positioning: Upright in bed Baseline Vocal Quality: Hoarse Volitional Cough: Strong Volitional Swallow: Able to elicit    Oral/Motor/Sensory Function Overall Oral Motor/Sensory Function: Within functional limits  Ice Chips     Thin Liquid Thin Liquid: Impaired Presentation: Cup;Self Fed Pharyngeal  Phase Impairments: Cough - Immediate;Cough - Delayed    Nectar Thick Nectar Thick Liquid: Impaired Presentation: Cup;Self Fed Pharyngeal Phase Impairments: Cough - Delayed   Honey Thick     Puree Puree: Within functional limits   Solid     Solid: Impaired Other Comments: Patient only ate very tiny bit of saltine cracker before  stopping      Sonia Baller, MA, CCC-SLP Speech Therapy

## 2021-10-04 NOTE — Progress Notes (Signed)
NAME:  Elizabeth Crosby, MRN:  [id number], DOB:  01/24/1946, LOS: 1 ADMISSION DATE:  10/03/2021, CONSULTATION DATE:  7/5 REFERRING MD: Dr. Christy Gentles, CHIEF COMPLAINT:  AMS, Respiratory Distress    History of Present Illness:  76 y/o F who presented to [facility] ER on 7/5 with reports of altered mental status and respiratory distress.   The patient presented to [facility] via EMS.  Her husband reports she got up in the middle of the night to go to the restroom and he heard her fall.  He reported she acted as if she was "drunk" but no ETOH suspected. He found her on her knees.  She takes tramadol and tizanidine and has gotten her meds mixed up in the past and taken too much on accident.  On presentation to the ER, her GCS was 8 and she had respiratory distress. She was intubated for airway protection - reported to have desaturated with RSI, 2 attempts.  Initial labs - Na 149, K 3.9, Cl 116, CO2 20, glucose 151, BUN 23, Sr Cr 1.67, alk phos 70, WBC 6.8, Hgb 10.9, and platelets 159. ETOH <10, UDS negative. CXR showed placement of ETT, R>L interstitial edema, effusions.  CT Head negative for acute process. BNP pending.    PCCM called for ICU admission.   Daughter, [name] [phone]), reports she spoke to her mother on 7/3 and she was in her usual state of health. She texted with her on 7/4 and she complained of feeling tired and of her chronic knee pain.  [name] states she occasionally will get her meds mixed up and she notes the patient to have slurring of her words.  The patient typically avoids going to the MD if possible.  She was recently seen by her PCP for routine check up and had a good report.  She notes that her "pancreas doesn't work well and she was recently started on meds to help".    Pertinent  Medical History  CKD III  DM  GERD Anxiety  Glaucoma  Gout  HLD HTN  OSA - nonadherent to CPAP  Difficulty with Anesthesia  Appendectomy Cholecystectomy  Endometrial Ca s/p Total Hysterectomy and  vaginal brachytherapy Omentectomy  Significant Hospital Events: Including procedures, antibiotic start and stop dates in addition to other pertinent events   7/5 Admit with AMS, respiratory distress, GCS 8. Intubated in ER.  7/6 Improved, off pressors and weaning on PSV, 1/2 BC positive for staph Lugdensis  Interim History / Subjective:  BC 1/2 positive for staph ;ugdensis, BP improved and off pressors, awake and responsive, tolerating SBT.  CXR has worsening effusions. Tmax 99.8    Objective   Blood pressure (!) 136/34, pulse 80, temperature 98.7 F (37.1 C), temperature source Axillary, resp. rate (!) 21, height 5' 1"  (1.549 m), weight 98.9 kg, SpO2 100 %.    Vent Mode: PRVC FiO2 (%):  [40 %-50 %] 40 % Set Rate:  [24 bmp] 24 bmp Vt Set:  [380 mL] 380 mL PEEP:  [5 cmH20] 5 cmH20 Plateau Pressure:  [10 cmH20-17 cmH20] 12 cmH20   Intake/Output Summary (Last 24 hours) at 10/04/2021 0745 Last data filed at 10/04/2021 0600 Gross per 24 hour  Intake 3415.95 ml  Output 1800 ml  Net 1615.95 ml   Filed Weights   10/03/21 0517  Weight: 98.9 kg    General:  elderly F, awake but ill-appearing, intubated and sedated  HEENT: MM pink/moist, sclera anicteric, pupils equal, ETT in place Neuro: awake, nodding to  questions and moving extremities on Fentanyl gtt CV: G3T5 rrr, holosystolic murmur PULM:  basilar rhonchi with good air movement in the upper lobes, Mechanically ventilated  GI: soft, non-tender Extremities: warm/dry, no Skin: no rashes or lesions   Labs reviewed Glu 187 Procal 0.10->0.27 Creatinine 1.65 GFR 32 WBC 13  Resolved Hospital Problem list     Assessment & Plan:   Acute Metabolic Encephalopathy  In setting of suspected accidental medication mix up, possible PNA. CT head/neck negative for acute process.  -improved since admission and able to extubate this morning - GCS 8 on presentation. Hx of similar admission in August.  UDS, ETOH negative. -early PT  efforts as able  -assess for infectious process   Acute Hypoxic Respiratory Failure in setting of Bilateral Infiltrates vs Effusions R>L OSA  Staph bacteremia  Infectious vs edema with effusions. Not compliant with CPAP at home.  Blood culture x1 positive for staph lugdensis   -de-escalate abx to Cefazolin -off pressors and procal <0.5, echo reassuring, no artificial heart valves or implanted cardiac devices to increase risk of endocarditis -repeat blood cultures tomorrow, if remain positive may need TEE -additional lasix 30m and monitor response -monitor effusions, may need thora   AKI on CKD III Baseline sr cr ~ 1.1-1.2 -stable today continue to follow BMP / urinary output -Replace electrolytes as indicated -Avoid nephrotoxic agents, ensure adequate renal perfusion  Sedation Related Hypotension  Noted in ER on propofol. Improved with cessation of propofol.  -resolved off sedation  Mechanical Fall  CT head, neck negative for acute process  -in the setting of encephalopathy  Hx HTN, HLD, Grade I DD  Systolic murmur -Echo with LVEF 60-65%,  -tele monitoring  -hold home lopressor, telmisartan-HCTZ for now, monitor BP and resume as needed -continue ASA, zocor, hold fenofibrate in the setting of AKI -lasix x1 as above   DM  -Q4 CBG  -continue SSI, moderate scale for now. May need to adjust pending glucose trend for goal 140-180 Best Practice (right click and "Reselect all SmartList Selections" daily)  Diet/type: NPO, advance as tolerated DVT prophylaxis: prophylactic heparin  GI prophylaxis: PPI Lines: N/A Foley:  Yes, and it is still needed Code Status:  full code Last date of multidisciplinary goals of care discussion: full code, confirmed with JAshley Marinerat bedside. Family update pending 7/6  Labs   CBC: Recent Labs  Lab 10/03/21 0531 10/03/21 0611 10/03/21 0903 10/04/21 0416  WBC 6.8  --   --  13.0*  NEUTROABS 4.7  --   --   --   HGB 10.9* 10.2*  9.9*  8.8* 9.8*  HCT 32.0* 30.0*  29.0* 26.0* 30.3*  MCV 95.2  --   --  98.4  PLT 159  --   --  153     Basic Metabolic Panel: Recent Labs  Lab 10/03/21 0531 10/03/21 0611 10/03/21 0903 10/03/21 1018 10/03/21 1459 10/04/21 0416  NA 149* 149*  150* 148*  --   --  145  K 3.9 3.9  3.9 3.6  --   --  4.5  CL 117* 116*  --   --   --  117*  CO2 20*  --   --   --   --  21*  GLUCOSE 151* 145*  --   --   --  210*  BUN 23 20  --   --   --  24*  CREATININE 1.67* 1.60*  --   --   --  1.65*  CALCIUM 8.4*  --   --   --   --  7.9*  MG  --   --   --  1.5* 1.6* 2.1  PHOS  --   --   --  2.7 2.7 3.0    GFR: Estimated Creatinine Clearance: 31.7 mL/min (A) (by C-G formula based on SCr of 1.65 mg/dL (H)). Recent Labs  Lab 10/03/21 0531 10/03/21 0537 10/03/21 1018 10/03/21 1459 10/04/21 0416  PROCALCITON  --   --  0.10  --  0.27  WBC 6.8  --   --   --  13.0*  LATICACIDVEN  --  2.2* 1.5 1.2  --      Liver Function Tests: Recent Labs  Lab 10/03/21 0531  AST 40  ALT 20  ALKPHOS 70  BILITOT 1.7*  PROT 5.3*  ALBUMIN 2.7*    No results for input(s): "LIPASE", "AMYLASE" in the last 168 hours. No results for input(s): "AMMONIA" in the last 168 hours.  ABG    Component Value Date/Time   PHART 7.437 10/03/2021 0903   PCO2ART 33.2 10/03/2021 0903   PO2ART 313 (H) 10/03/2021 0903   HCO3 22.7 10/03/2021 0903   TCO2 24 10/03/2021 0903   ACIDBASEDEF 2.0 10/03/2021 0903   O2SAT 100 10/03/2021 0903     Coagulation Profile: No results for input(s): "INR", "PROTIME" in the last 168 hours.  Cardiac Enzymes: No results for input(s): "CKTOTAL", "CKMB", "CKMBINDEX", "TROPONINI" in the last 168 hours.  HbA1C: Hgb A1c MFr Bld  Date/Time Value Ref Range Status  10/03/2021 10:18 AM 5.7 (H) 4.8 - 5.6 % Final    Comment:    (NOTE) Pre diabetes:          5.7%-6.4%  Diabetes:              >6.4%  Glycemic control for   <7.0% adults with diabetes   11/19/2020 05:03 AM 7.2 (H) 4.8 - 5.6 %  Final    Comment:    (NOTE) Pre diabetes:          5.7%-6.4%  Diabetes:              >6.4%  Glycemic control for   <7.0% adults with diabetes     CBG: Recent Labs  Lab 10/03/21 1525 10/03/21 1946 10/03/21 2327 10/04/21 0334 10/04/21 0740  GLUCAP 92 152* 161* 181* 187*     Review of Systems:   Unable to complete as patient is altered on mechanical ventilation. Information obtained from providers at bedside, chart review and family.   Past Medical History:  She,  has a past medical history of Anxiety, Arthritis, Cellulitis, Chronic kidney disease (CKD), stage III (moderate) ([place]), Complication of anesthesia, Diabetes mellitus without complication ([place]), GERD (gastroesophageal reflux disease), Glaucoma, Gout, History of blood transfusion, History of kidney stones, History of radiation therapy, Hyperlipidemia, Hypertension, Leukocytes in urine, Lumbar back pain, and Sleep apnea.   Surgical History:   Past Surgical History:  Procedure Laterality Date   APPENDECTOMY  1972   CATARACT EXTRACTION W/PHACO Right 01/10/2014   Procedure: CATARACT EXTRACTION PHACO AND INTRAOCULAR LENS PLACEMENT ([place]);  Surgeon: Tonny Branch, MD;  Location: [facility];  Service: Ophthalmology;  Laterality: Right;  CDE:5.78   CATARACT EXTRACTION W/PHACO Left 01/27/2014   Procedure: CATARACT EXTRACTION PHACO AND INTRAOCULAR LENS PLACEMENT LEFT EYE CDE=5.75;  Surgeon: Tonny Branch, MD;  Location: [facility];  Service: Ophthalmology;  Laterality: Left;   CHOLECYSTECTOMY  1972   COLONOSCOPY WITH PROPOFOL N/A 09/01/2012  Procedure: COLONOSCOPY WITH PROPOFOL;  Surgeon: Garlan Fair, MD;  Location: [facility];  Service: Endoscopy;  Laterality: N/A;   KIDNEY STONE SURGERY     OMENTECTOMY  09/30/2017   Procedure: OMENTECTOMY;  Surgeon: [name], MD;  Location: [facility];  Service: Gynecology;;   ROBOTIC ASSISTED TOTAL HYSTERECTOMY WITH BILATERAL SALPINGO OOPHERECTOMY Bilateral 09/30/2017   Procedure: ROBOTIC ASSISTED TOTAL  HYSTERECTOMY WITH BILATERAL SALPINGO OOPHORECTOMY;  Surgeon: [name], MD;  Location: [facility];  Service: Gynecology;  Laterality: Bilateral;     Social History:   reports that she has never smoked. She has never used smokeless tobacco. She reports that she does not drink alcohol and does not use drugs.   Family History:  Her family history includes Asthma in her father; Colon cancer in her father; Stroke in her mother.   Allergies Allergies  Allergen Reactions   Ozempic (0.25 Or 0.5 Mg-Dose) [Semaglutide(0.25 Or 0.62m-Dos)] Nausea And Vomiting   Amoxil [Amoxicillin] Other (See Comments)    Cellulitis    Glucophage [Metformin] Diarrhea   Invokana [Canagliflozin] Other (See Comments)    UTI   Lipitor [Atorvastatin] Other (See Comments)    Myalgias   Lyrica Cr [Pregabalin Er] Other (See Comments)    Dizziness    Other Nausea And Vomiting    Pt states does not know drug but anesthesia drugs have made her have nausea and vomiting.    Sulfa Antibiotics Diarrhea   Trulicity [Dulaglutide] Other (See Comments)    GI Upset   Potassium-Containing Compounds Rash     Home Medications  Prior to Admission medications   Medication Sig Start Date End Date Taking? Authorizing Provider  acetaminophen (TYLENOL) 500 MG tablet Take 1 tablet (500 mg total) by mouth every 4 (four) hours as needed for moderate pain, headache or fever. 11/25/20   Pokhrel, LCorrie Mckusick MD  aspirin EC 81 MG tablet Take 81 mg by mouth daily. Swallow whole.    [provider]  Carboxymethylcellul-Glycerin (LUBRICATING EYE DROPS OP) Place 1 drop into both eyes daily as needed (irritation).    [provider]  cetirizine (ZYRTEC) 10 MG tablet Take 10 mg by mouth daily.     [provider]  diclofenac Sodium (VOLTAREN) 1 % GEL Apply 2 g topically 4 (four) times daily.    [provider]  fenofibrate 160 MG tablet Take 160 mg by mouth every morning.     [provider]  HUMALOG KWIKPEN  200 UNIT/ML KwikPen Inject 10 Units into the skin daily with supper. 11/18/19   [provider]  hydrocortisone cream 1 % Apply 1 application topically daily as needed for itching.    [provider]  insulin degludec (TRESIBA) 200 UNIT/ML FlexTouch Pen Inject 40 Units into the skin daily with breakfast.    [provider]  lipase/protease/amylase (CREON) 12000-38000 units CPEP capsule Take by mouth.    [provider]  loperamide (IMODIUM A-D) 2 MG tablet Take 4 mg by mouth as needed for diarrhea or loose stools.    [provider]  Melatonin 5 MG TABS Take 5-10 mg by mouth at bedtime as needed (sleep).    [provider]  metoprolol tartrate (LOPRESSOR) 25 MG tablet Take 25 mg by mouth 2 (two) times daily.    [provider]  omeprazole (PRILOSEC) 20 MG capsule Take 20 mg by mouth daily. 08/19/18   [provider]  prochlorperazine (COMPAZINE) 10 MG tablet Take 1 tablet (10 mg total) by mouth every  6 (six) hours as needed for nausea or vomiting. 12/18/17   Gery Pray, MD  simvastatin (ZOCOR) 20 MG tablet Take 20 mg by mouth every evening.    [provider]  telmisartan-hydrochlorothiazide (MICARDIS HCT) 80-25 MG tablet Take 1 tablet by mouth daily. 01/06/18   [provider]  traMADol (ULTRAM) 50 MG tablet Take 1 tablet (50 mg total) by mouth every 12 (twelve) hours as needed for severe pain or moderate pain. 11/25/20 11/25/21  Pokhrel, Corrie Mckusick, MD  vancomycin (VANCOCIN) 125 MG capsule Take 125 mg by mouth 4 (four) times daily.    [provider]     Critical care time: 35 minutes    CRITICAL CARE Performed by: Otilio Carpen Esley Brooking   Total critical care time: 35 minutes  Critical care time was exclusive of separately billable procedures and treating other patients.  Critical care was necessary to treat or prevent imminent or life-threatening deterioration.  Critical care was time spent personally  by me on the following activities: development of treatment plan with patient and/or surrogate as well as nursing, discussions with consultants, evaluation of patient's response to treatment, examination of patient, obtaining history from patient or surrogate, ordering and performing treatments and interventions, ordering and review of laboratory studies, ordering and review of radiographic studies, pulse oximetry and re-evaluation of patient's condition.  Otilio Carpen Sawsan Riggio, PA-C [facility] See Amion for pager If no response to pager , please call [phone] until 7pm After 7:00 pm call Elink  [phone]

## 2021-10-04 NOTE — Progress Notes (Signed)
Notified Ccm of low blood pressure despite max on levophed   Per CCM ok for MAP to be less than 65 as long as systolic remains over 114

## 2021-10-04 NOTE — Procedures (Signed)
Extubation Procedure Note  Patient Details:   Name: Elizabeth Crosby DOB: 1945-08-16 MRN: [id number]   Airway Documentation:    Vent end date: 10/04/21 Vent end time: 0929   Evaluation  O2 sats: stable throughout Complications: No apparent complications Patient did tolerate procedure well. Bilateral Breath Sounds: Clear, Diminished   Yes  Pt extubated to 2L [place], pt tolerating well at this time. Cuff leak present, no stridor noted, RN at bedside, RT will monitor.   Geni Bers Rhya Shan 10/04/2021, 9:30 AM

## 2021-10-04 NOTE — Evaluation (Signed)
Physical Therapy Evaluation Patient Details Name: Elizabeth Crosby MRN: [id number] DOB: 1945-10-20 Today's Date: 10/04/2021  History of Present Illness  The pt is a 76 yo female presenting 7/5 with AMS and after sustaining 2 falls at home. Pt given narcan by EMS with improvement in respirations. Concern for pt accidentially taking extra dose of medications. GCS of 8 upon arrival, pt intubated due to respiratory distress, extubated 7/6. PMH includes: CKD III, DM, GERD, anxiety, gout, HLD, HTN, and obesity.   Clinical Impression  Pt in bed upon arrival of PT, agreeable to evaluation at this time. Prior to admission the pt was mobilizing with use of cane, RW, or rollator depending on space in her home or distance she needed to walk, she lives with her spouse who is independent but unable to provide physical assist and a step-daughter who can assist but is working full-time. The pt now presents with limitations in functional mobility, strength, stability, power, and activity tolerance due to above dx, and will continue to benefit from skilled PT to address these deficits. The pt was able to answer questions about PLOF and home set up with ~50-75% accuracy. She was able to follow commands with increased time, but needed modA of 2 to stand and BUE support. She was unable to take more than small lateral steps along EOB and needed modA of 2 to maintain balance and to facilitate wt shift. Will continue to benefit from skilled PT acutely, and will need SNF to maximize functional independence prior to return home.         Recommendations for follow up therapy are one component of a multi-disciplinary discharge planning process, led by the attending physician.  Recommendations may be updated based on patient status, additional functional criteria and insurance authorization.  Follow Up Recommendations Skilled nursing-short term rehab (<3 hours/day) Can patient physically be transported by private vehicle: No     Assistance Recommended at Discharge Frequent or constant Supervision/Assistance  Patient can return home with the following  A lot of help with walking and/or transfers;A lot of help with bathing/dressing/bathroom;Assistance with cooking/housework;Assistance with feeding;Direct supervision/assist for medications management;Direct supervision/assist for financial management;Help with stairs or ramp for entrance;Assist for transportation    Equipment Recommendations None recommended by PT  Recommendations for Other Services  OT consult    Functional Status Assessment Patient has had a recent decline in their functional status and demonstrates the ability to make significant improvements in function in a reasonable and predictable amount of time.     Precautions / Restrictions Precautions Precautions: Fall Restrictions Weight Bearing Restrictions: No      Mobility  Bed Mobility Overal bed mobility: Needs Assistance Bed Mobility: Supine to Sit, Sit to Supine     Supine to sit: Mod assist, +2 for physical assistance, HOB elevated Sit to supine: Mod assist, +2 for physical assistance   General bed mobility comments: modA to complete movement of LE and elevation of trunk, pt using bed rails    Transfers Overall transfer level: Needs assistance Equipment used: 2 person hand held assist Transfers: Sit to/from Stand Sit to Stand: Mod assist, +2 physical assistance           General transfer comment: modA to power up, posterior lean initially. fatigues quickly    Ambulation/Gait Ambulation/Gait assistance: Mod assist, +2 physical assistance Gait Distance (Feet): 3 Feet Assistive device: 2 person hand held assist Gait Pattern/deviations: Step-to pattern, Decreased stride length, Shuffle Gait velocity: decreased     General Gait Details:  small lateral steps along EOB, modA to steady with movement of LE, improved step clearance with RLE compared to LLE     Balance Overall  balance assessment: Needs assistance Sitting-balance support: Bilateral upper extremity supported Sitting balance-Leahy Scale: Poor Sitting balance - Comments: at times needing minA, cues to correct posterior LOB Postural control: Posterior lean Standing balance support: Bilateral upper extremity supported Standing balance-Leahy Scale: Poor Standing balance comment: dependent on therapist assist                             Pertinent Vitals/Pain Pain Assessment Pain Assessment: No/denies pain    Home Living Family/patient expects to be discharged to:: Private residence Living Arrangements: Spouse/significant other (step child lives with them and works from home currently.) Available Help at Discharge: Family;Available 24 hours/day Type of Home: House Home Access: Stairs to enter Entrance Stairs-Rails: Psychiatric nurse of Steps: 4 +1 STE at carport Alternate Level Stairs-Number of Steps: flight to basement Home Layout: Two level Home Equipment: Conservation officer, nature (2 wheels);Rollator (4 wheels);Cane - single point;Wheelchair - manual Additional Comments: daughter present to confirm    Prior Function Prior Level of Function : Independent/Modified Independent;Driving;History of Falls (last six months)             Mobility Comments: pt walks with RW or cane in the home, uses rollator for community distances ADLs Comments: takes showers or bird baths, reports independent but increased time     Hand Dominance   Dominant Hand: Right    Extremity/Trunk Assessment   Upper Extremity Assessment Upper Extremity Assessment: Defer to OT evaluation    Lower Extremity Assessment Lower Extremity Assessment: Generalized weakness    Cervical / Trunk Assessment Cervical / Trunk Assessment: Other exceptions Cervical / Trunk Exceptions: large body habitus  Communication   Communication: No difficulties (slowed speech)  Cognition Arousal/Alertness:  Awake/alert Behavior During Therapy: Flat affect Overall Cognitive Status: Impaired/Different from baseline Area of Impairment: Attention, Following commands, Safety/judgement, Awareness, Problem solving                   Current Attention Level: Focused   Following Commands: Follows one step commands with increased time Safety/Judgement: Decreased awareness of safety, Decreased awareness of deficits Awareness: Intellectual Problem Solving: Slow processing, Decreased initiation, Difficulty sequencing, Requires verbal cues, Requires tactile cues General Comments: pt with slowed processing and increased time for all movements and verbal responses. speaks in one word at a time, per daughter the pt fucutates between clear and altered        General Comments General comments (skin integrity, edema, etc.): VSS on RA        Assessment/Plan    PT Assessment Patient needs continued PT services  PT Problem List Decreased strength;Decreased range of motion;Decreased activity tolerance;Decreased balance;Decreased mobility;Decreased cognition;Decreased knowledge of use of DME;Decreased safety awareness;Decreased coordination;Decreased knowledge of precautions       PT Treatment Interventions DME instruction;Gait training;Stair training;Functional mobility training;Therapeutic activities;Therapeutic exercise;Balance training;Patient/family education    PT Goals (Current goals can be found in the Care Plan section)  Acute Rehab PT Goals Patient Stated Goal: to be more independent prior to return home PT Goal Formulation: With patient Time For Goal Achievement: 10/18/21 Potential to Achieve Goals: Good    Frequency Min 3X/week        AM-PAC PT "6 Clicks" Mobility  Outcome Measure Help needed turning from your back to your side while in a flat bed without  using bedrails?: A Lot Help needed moving from lying on your back to sitting on the side of a flat bed without using bedrails?:  A Lot Help needed moving to and from a bed to a chair (including a wheelchair)?: A Lot Help needed standing up from a chair using your arms (e.g., wheelchair or bedside chair)?: A Lot Help needed to walk in hospital room?: Total Help needed climbing 3-5 steps with a railing? : Total 6 Click Score: 10    End of Session Equipment Utilized During Treatment: Gait belt Activity Tolerance: Patient limited by fatigue Patient left: in bed;with call bell/phone within reach;with bed alarm set;with family/visitor present Nurse Communication: Mobility status PT Visit Diagnosis: Unsteadiness on feet (R26.81);Repeated falls (R29.6);Muscle weakness (generalized) (M62.81)    Time: 2025-4270 PT Time Calculation (min) (ACUTE ONLY): 33 min   Charges:   PT Evaluation $PT Eval Moderate Complexity: 1 Mod PT Treatments $Therapeutic Activity: 8-22 mins        [place] Carbo, PT, DPT   Acute Rehabilitation Department  Sandra Cockayne 10/04/2021, 6:17 PM

## 2021-10-04 NOTE — Procedures (Signed)
Bedside chest ultrasonography:  Showing bilateral pleural effusion right more than left

## 2021-10-04 NOTE — Progress Notes (Signed)
  Transition of Care [facility]) Screening Note   Patient Details  Name: Elizabeth Crosby Date of Birth: 07-12-1945   Transition of Care [facility]) CM/SW Contact:    Benard Halsted, LCSW Phone Number: 10/04/2021, 12:38 PM    Transition of Care Department [facility]) has reviewed patient. We will continue to monitor patient advancement and therapy needs through interdisciplinary progression rounds. If new patient transition needs arise, please place a TOC consult.

## 2021-10-04 NOTE — Progress Notes (Signed)
PHARMACY - PHYSICIAN COMMUNICATION CRITICAL VALUE ALERT - BLOOD CULTURE IDENTIFICATION (BCID)  Elizabeth Crosby is an 76 y.o. female who presented to [facility] on 10/03/2021 with a chief complaint of altered mental status and respiratory distress.   Assessment:  1 of 2 Staphylococcus lugdunensis  Name of physician (or Provider) Contacted: Sommer  Current antibiotics: ceftriaxone and azithromycin  Changes to prescribed antibiotics recommended: provider wanted to add one time dose of Vancomycin to add Gram (+) coverage until the day time ID team could see this patient. He did not want to de-escalate from ceftriaxone to cefazolin at this time.   Results for orders placed or performed during the hospital encounter of 10/03/21  Blood Culture ID Panel (Reflexed) (Collected: 10/03/2021  5:33 AM)  Result Value Ref Range   Enterococcus faecalis NOT DETECTED NOT DETECTED   Enterococcus Faecium NOT DETECTED NOT DETECTED   Listeria monocytogenes NOT DETECTED NOT DETECTED   Staphylococcus species DETECTED (A) NOT DETECTED   Staphylococcus aureus (BCID) NOT DETECTED NOT DETECTED   Staphylococcus epidermidis NOT DETECTED NOT DETECTED   Staphylococcus lugdunensis DETECTED (A) NOT DETECTED   Streptococcus species NOT DETECTED NOT DETECTED   Streptococcus agalactiae NOT DETECTED NOT DETECTED   Streptococcus pneumoniae NOT DETECTED NOT DETECTED   Streptococcus pyogenes NOT DETECTED NOT DETECTED   A.calcoaceticus-baumannii NOT DETECTED NOT DETECTED   Bacteroides fragilis NOT DETECTED NOT DETECTED   Enterobacterales NOT DETECTED NOT DETECTED   Enterobacter cloacae complex NOT DETECTED NOT DETECTED   Escherichia coli NOT DETECTED NOT DETECTED   Klebsiella aerogenes NOT DETECTED NOT DETECTED   Klebsiella oxytoca NOT DETECTED NOT DETECTED   Klebsiella pneumoniae NOT DETECTED NOT DETECTED   Proteus species NOT DETECTED NOT DETECTED   Salmonella species NOT DETECTED NOT DETECTED   Serratia marcescens NOT  DETECTED NOT DETECTED   Haemophilus influenzae NOT DETECTED NOT DETECTED   Neisseria meningitidis NOT DETECTED NOT DETECTED   Pseudomonas aeruginosa NOT DETECTED NOT DETECTED   Stenotrophomonas maltophilia NOT DETECTED NOT DETECTED   Candida albicans NOT DETECTED NOT DETECTED   Candida auris NOT DETECTED NOT DETECTED   Candida glabrata NOT DETECTED NOT DETECTED   Candida krusei NOT DETECTED NOT DETECTED   Candida parapsilosis NOT DETECTED NOT DETECTED   Candida tropicalis NOT DETECTED NOT DETECTED   Cryptococcus neoformans/gattii NOT DETECTED NOT DETECTED   Methicillin resistance mecA/C NOT DETECTED NOT DETECTED    Jodean Lima Seddrick Flax 10/04/2021  4:23 AM

## 2021-10-05 ENCOUNTER — Inpatient Hospital Stay (HOSPITAL_COMMUNITY): Payer: Medicare Other

## 2021-10-05 DIAGNOSIS — R0603 Acute respiratory distress: Secondary | ICD-10-CM | POA: Diagnosis not present

## 2021-10-05 DIAGNOSIS — G9341 Metabolic encephalopathy: Secondary | ICD-10-CM | POA: Diagnosis not present

## 2021-10-05 DIAGNOSIS — R7881 Bacteremia: Secondary | ICD-10-CM | POA: Diagnosis not present

## 2021-10-05 LAB — CBC
HCT: 28.3 % — ABNORMAL LOW (ref 36.0–46.0)
Hemoglobin: 8.9 g/dL — ABNORMAL LOW (ref 12.0–15.0)
MCH: 30.9 pg (ref 26.0–34.0)
MCHC: 31.4 g/dL (ref 30.0–36.0)
MCV: 98.3 fL (ref 80.0–100.0)
Platelets: 123 10*3/uL — ABNORMAL LOW (ref 150–400)
RBC: 2.88 MIL/uL — ABNORMAL LOW (ref 3.87–5.11)
RDW: 14.6 % (ref 11.5–15.5)
WBC: 9.6 10*3/uL (ref 4.0–10.5)
nRBC: 0 % (ref 0.0–0.2)

## 2021-10-05 LAB — BASIC METABOLIC PANEL
Anion gap: 10 (ref 5–15)
BUN: 27 mg/dL — ABNORMAL HIGH (ref 8–23)
CO2: 22 mmol/L (ref 22–32)
Calcium: 8 mg/dL — ABNORMAL LOW (ref 8.9–10.3)
Chloride: 111 mmol/L (ref 98–111)
Creatinine, Ser: 1.78 mg/dL — ABNORMAL HIGH (ref 0.44–1.00)
GFR, Estimated: 29 mL/min — ABNORMAL LOW (ref >60)
Glucose, Bld: 69 mg/dL — ABNORMAL LOW (ref 70–99)
Potassium: 3.9 mmol/L (ref 3.5–5.1)
Sodium: 143 mmol/L (ref 135–145)

## 2021-10-05 LAB — GLUCOSE, CAPILLARY
Glucose-Capillary: 107 mg/dL — ABNORMAL HIGH (ref 70–99)
Glucose-Capillary: 133 mg/dL — ABNORMAL HIGH (ref 70–99)
Glucose-Capillary: 159 mg/dL — ABNORMAL HIGH (ref 70–99)
Glucose-Capillary: 196 mg/dL — ABNORMAL HIGH (ref 70–99)
Glucose-Capillary: 216 mg/dL — ABNORMAL HIGH (ref 70–99)
Glucose-Capillary: 65 mg/dL — ABNORMAL LOW (ref 70–99)
Glucose-Capillary: 68 mg/dL — ABNORMAL LOW (ref 70–99)
Glucose-Capillary: 86 mg/dL (ref 70–99)

## 2021-10-05 LAB — PROCALCITONIN: Procalcitonin: 0.44 ng/mL

## 2021-10-05 MED ORDER — ENSURE ENLIVE PO LIQD
237.0000 mL | Freq: Two times a day (BID) | ORAL | Status: DC
  Administered 2021-10-05 – 2021-10-08 (×4): 237 mL via ORAL

## 2021-10-05 NOTE — Progress Notes (Signed)
SLP Cancellation Note  Patient Details Name: Elizabeth Crosby MRN: [id number] DOB: 1945-09-12   Cancelled treatment:       Reason Eval/Treat Not Completed: Patient at procedure or test/unavailable (Pt currently working with therapy. SLP will follow up as schedule allows.)  [name] I. Hardin Negus, [place], [place] Office number [phone] Pager [place] 10/05/2021, 11:25 AM

## 2021-10-05 NOTE — Progress Notes (Signed)
Nutrition Follow-up  DOCUMENTATION CODES:   Obesity unspecified  INTERVENTION:   Ensure Enlive po BID, each supplement provides 350 kcal and 20 grams of protein.  Encourage PO intake, discussed importance of nutrition   NUTRITION DIAGNOSIS:   Inadequate oral intake related to dysphagia as evidenced by meal completion < 50%. Progressing.   GOAL:   Patient will meet greater than or equal to 90% of their needs Progressing.   MONITOR:   PO intake, Diet advancement, Supplement acceptance  REASON FOR ASSESSMENT:   Consult Enteral/tube feeding initiation and management  ASSESSMENT:   Pt with PMH of CKD III, DM, GERD, anxiety, gout, HLD, HTN, OSA nonadherent to CPAP admitted due to acute metabolic encephalopathy in setting of accidental medication and possible PNA.   Pt discussed during ICU rounds and with RN and MD.   Damaris Schooner with pt and family. Pt confirms weight loss. Does seem to have come confusion, unable to answer all questions.  Reports that she drank thickened liquids this morning for breakfast but could not remember more than that.  Pt willing to try ensure.   7/5 intubated for resp distress  7/6 extubated; diet advanced  Medications reviewed and include: SSI, protonix   Labs reviewed   Diet Order:   Diet Order             DIET DYS 3 Room service appropriate? Yes with Assist; Fluid consistency: Nectar Thick  Diet effective now                   EDUCATION NEEDS:   Education needs have been addressed  Skin:  Skin Assessment: Reviewed RN Assessment  Last BM:     Height:   Ht Readings from Last 1 Encounters:  10/03/21 '5\' 1"'$  (1.549 m)    Weight:   Wt Readings from Last 1 Encounters:  10/03/21 98.9 kg    BMI:  Body mass index is 41.2 kg/m.  Estimated Nutritional Needs:   Kcal:  1400-1600  Protein:  75-85 grams  Fluid:  > 1.5 L/day  Lockie Pares., RD, LDN, CNSC See AMiON for contact information

## 2021-10-05 NOTE — Progress Notes (Signed)
Modified Barium Swallow Progress Note  Patient Details  Name: Elizabeth Crosby MRN: [id number] Date of Birth: 1945/11/11  Today's Date: 10/05/2021  Modified Barium Swallow completed.  Full report located under Chart Review in the Imaging Section.  Brief recommendations include the following:  Clinical Impression  Despite SLP's prompts and frequent repositioning, the view of pt's larynx was intermittently obscured by pt's shoulders. Pt presents with a pharyngeal delay which resulted in penetration (PAS 5) and aspiration (PAS 7) of thin liquids when consecutive swallows were used or large boluses consumed. The effectiveness of pt's cough could not be conclusively determined due to the suboptimal view of the larynx. Some functional benefit was noted with a chin tuck posture, but the consistent effectiveness of this was unclear since pt's shoulders frequently became elevated when implementing this posture. Reduced bolus sizes eliminated or improved laryngeal invasion to PAS 3. However, depth increased to PAS 5 once and subsequent aspiration (PAS 7) is suspected due to pt's coughing thereafter. Pt's diet will be advanced to regular texture solids and nectar thick liquids will be continued. However, thin water will be allowed between meals after oral care with observance of precautions (i.e, no straws & avoidance of consecutive swallows). Pt's prognosis for advancement to thin liquids is judged to be very good with training for consistent use of swallowing precautions.   Swallow Evaluation Recommendations       SLP Diet Recommendations: Regular solids;Nectar thick liquid (thin water allowed between meals via cup (no straws))   Liquid Administration via: Cup;Straw   Medication Administration: Whole meds with puree   Supervision: Intermittent supervision to cue for compensatory strategies   Compensations: Slow rate;Small sips/bites   Postural Changes: Seated upright at 90 degrees         Litzi Binning  I. Hardin Negus, [place], [place] Office number [phone] Pager [place] 10/05/2021,3:27 PM

## 2021-10-05 NOTE — Progress Notes (Addendum)
NAME:  Elizabeth Crosby, MRN:  [id number], DOB:  29-Jul-1945, LOS: 2 ADMISSION DATE:  10/03/2021, CONSULTATION DATE:  7/5 REFERRING MD: Dr. Christy Gentles, CHIEF COMPLAINT:  AMS, Respiratory Distress    History of Present Illness:  76 y/o F who presented to [facility] ER on 7/5 with reports of altered mental status and respiratory distress.   The patient presented to [facility] via EMS.  Her husband reports she got up in the middle of the night to go to the restroom and he heard her fall.  He reported she acted as if she was "drunk" but no ETOH suspected. He found her on her knees.  She takes tramadol and tizanidine and has gotten her meds mixed up in the past and taken too much on accident.  On presentation to the ER, her GCS was 8 and she had respiratory distress. She was intubated for airway protection - reported to have desaturated with RSI, 2 attempts.  Initial labs - Na 149, K 3.9, Cl 116, CO2 20, glucose 151, BUN 23, Sr Cr 1.67, alk phos 70, WBC 6.8, Hgb 10.9, and platelets 159. ETOH <10, UDS negative. CXR showed placement of ETT, R>L interstitial edema, effusions.  CT Head negative for acute process. BNP pending.    PCCM called for ICU admission.   Daughter, [name] [phone]), reports she spoke to her mother on 7/3 and she was in her usual state of health. She texted with her on 7/4 and she complained of feeling tired and of her chronic knee pain.  [name] states she occasionally will get her meds mixed up and she notes the patient to have slurring of her words.  The patient typically avoids going to the MD if possible.  She was recently seen by her PCP for routine check up and had a good report.  She notes that her "pancreas doesn't work well and she was recently started on meds to help".    Pertinent  Medical History  CKD III  DM  GERD Anxiety  Glaucoma  Gout  HLD HTN  OSA - nonadherent to CPAP  Difficulty with Anesthesia  Appendectomy Cholecystectomy  Endometrial Ca s/p Total Hysterectomy and  vaginal brachytherapy Omentectomy  Significant Hospital Events: Including procedures, antibiotic start and stop dates in addition to other pertinent events   7/5 Admit with AMS, respiratory distress, GCS 8. Intubated in ER.  7/6 Improved, off pressors and weaning on PSV, 1/2 BC positive for staph Lugdensis 7/7 extubated yesterday, doing well on RA, worked with speech and PT, no fever   Interim History / Subjective:  No overnight events, stable on RA, mental status improved, able to tolerate diet and work with PT, no complaints today UOP 2.4 L after Lasix, +449 since admission  Objective   Blood pressure (!) 110/48, pulse 73, temperature 98.3 F (36.8 C), temperature source Oral, resp. rate 13, height _0  (1.549 m), weight 98.9 kg, SpO2 95 %.      Intake/Output Summary (Last 24 hours) at 10/05/2021 0744 Last data filed at 10/05/2021 0600 Gross per 24 hour  Intake 1234.82 ml  Output 2415 ml  Net -1180.18 ml    Filed Weights   10/03/21 0517  Weight: 98.9 kg     General:  chronically ill-appearing F awake and in no distress HEENT: MM pink/moist, sclera anicteric, poor dentition Neuro: awake, oriented to person and place, still confused to situation prior to admission, moving all extremities, non-focal CV: s1s2 rrr, no m/r/g PULM:  mildly decreased air  entry bilateral bases without rhonchi or wheezing GI: soft, obese, non-tender Extremities: warm/dry, 1+ pre-tibial edema  Skin: no rashes or lesions   Labs reviewed Glu 187 Procal 0.10->0.27->0.44 Creatinine 1.78 GFR 29 WBC 9.6  Resolved Hospital Problem list   Hypotension  Assessment & Plan:   Acute Metabolic Encephalopathy  In setting of suspected accidental medication mix up, possible PNA. CT head/neck negative for acute process.  -initially intubated 7/5, extubated 7/6 - GCS 8 on presentation. Hx of similar admission in August.  UDS, ETOH negative. -worked with PT, will need frequent or constant assistance, will  likely need rehab -stable for transfer out of ICU today  Acute Hypoxic Respiratory Failure in setting of Bilateral Infiltrates vs Effusions R>L OSA  Staph bacteremia  Infectious vs edema with effusions. Not compliant with CPAP at home.  Blood culture x1 positive for staph lugdensis   -de-escalate abx to Cefazolin -only required pressors while on sedation, fever curve and WBC  echo reassuring, no artificial heart valves or implanted cardiac devices to increase risk of endocarditis -repeat blood cultures today, if remain positive may need TEE -diuresed well, hold lasix today -monitor effusions, may need thora if enlarging  AKI on CKD III Baseline sr cr ~ 1.1-1.2 -slight rise today continue to follow BMP / urinary output, though making good urine -hold further Lasix -Replace electrolytes as indicated -Avoid nephrotoxic agents, ensure adequate renal perfusion  Sedation Related Hypotension  Noted in ER on propofol. Improved with cessation of propofol.  -resolved off sedation  Mechanical Fall  CT head, neck negative for acute process  -in the setting of encephalopathy -PT eval  Hx HTN, HLD, Grade I DD  Systolic murmur -Echo with LVEF 60-65%,  -tele monitoring  -hold home lopressor, telmisartan-HCTZ for now, monitor BP and resume as needed -continue ASA, zocor, hold fenofibrate in the setting of AKI -diuresed with lasix  DM  -Q4 CBG  -continue SSI, moderate scale for now. May need to adjust pending glucose trend for goal 140-180  Best Practice (right click and "Reselect all SmartList Selections" daily)  Diet/type: dysphagia diet (see orders), advance as tolerated DVT prophylaxis: prophylactic heparin  GI prophylaxis: PPI Lines: N/A Foley:  removal ordered  Code Status:  full code Last date of multidisciplinary goals of care discussion: full code, confirmed with [name] at bedside. Family updated at the bedside 7/6  Labs   CBC: Recent Labs  Lab 10/03/21 0531  10/03/21 1610 10/03/21 0903 10/04/21 0416 10/05/21 0312  WBC 6.8  --   --  13.0* 9.6  NEUTROABS 4.7  --   --   --   --   HGB 10.9* 10.2*  9.9* 8.8* 9.8* 8.9*  HCT 32.0* 30.0*  29.0* 26.0* 30.3* 28.3*  MCV 95.2  --   --  98.4 98.3  PLT 159  --   --  153 123*     Basic Metabolic Panel: Recent Labs  Lab 10/03/21 0531 10/03/21 0611 10/03/21 0903 10/03/21 1018 10/03/21 1459 10/04/21 0416 10/04/21 1532 10/05/21 0312  NA 149* 149*  150* 148*  --   --  145  --  143  K 3.9 3.9  3.9 3.6  --   --  4.5  --  3.9  CL 117* 116*  --   --   --  117*  --  111  CO2 20*  --   --   --   --  21*  --  22  GLUCOSE 151* 145*  --   --   --  210*  --  69*  BUN 23 20  --   --   --  24*  --  27*  CREATININE 1.67* 1.60*  --   --   --  1.65*  --  1.78*  CALCIUM 8.4*  --   --   --   --  7.9*  --  8.0*  MG  --   --   --  1.5* 1.6* 2.1 2.0  --   PHOS  --   --   --  2.7 2.7 3.0 3.1  --     GFR: Estimated Creatinine Clearance: 29.4 mL/min (A) (by C-G formula based on SCr of 1.78 mg/dL (H)). Recent Labs  Lab 10/03/21 0531 10/03/21 0537 10/03/21 1018 10/03/21 1459 10/04/21 0416 10/05/21 0312  PROCALCITON  --   --  0.10  --  0.27 0.44  WBC 6.8  --   --   --  13.0* 9.6  LATICACIDVEN  --  2.2* 1.5 1.2  --   --      Liver Function Tests: Recent Labs  Lab 10/03/21 0531  AST 40  ALT 20  ALKPHOS 70  BILITOT 1.7*  PROT 5.3*  ALBUMIN 2.7*    No results for input(s): "LIPASE", "AMYLASE" in the last 168 hours. No results for input(s): "AMMONIA" in the last 168 hours.  ABG    Component Value Date/Time   PHART 7.437 10/03/2021 0903   PCO2ART 33.2 10/03/2021 0903   PO2ART 313 (H) 10/03/2021 0903   HCO3 22.7 10/03/2021 0903   TCO2 24 10/03/2021 0903   ACIDBASEDEF 2.0 10/03/2021 0903   O2SAT 100 10/03/2021 0903     Coagulation Profile: No results for input(s): "INR", "PROTIME" in the last 168 hours.  Cardiac Enzymes: No results for input(s): "CKTOTAL", "CKMB", "CKMBINDEX",  "TROPONINI" in the last 168 hours.  HbA1C: Hgb A1c MFr Bld  Date/Time Value Ref Range Status  10/03/2021 10:18 AM 5.7 (H) 4.8 - 5.6 % Final    Comment:    (NOTE) Pre diabetes:          5.7%-6.4%  Diabetes:              >6.4%  Glycemic control for   <7.0% adults with diabetes   11/19/2020 05:03 AM 7.2 (H) 4.8 - 5.6 % Final    Comment:    (NOTE) Pre diabetes:          5.7%-6.4%  Diabetes:              >6.4%  Glycemic control for   <7.0% adults with diabetes     CBG: Recent Labs  Lab 10/04/21 0740 10/04/21 1121 10/04/21 1530 10/04/21 1928 10/04/21 2330  GLUCAP 187* 164* 137* 154* 152*     Review of Systems:   Unable to complete as patient is altered on mechanical ventilation. Information obtained from providers at bedside, chart review and family.   Past Medical History:  She,  has a past medical history of Anxiety, Arthritis, Cellulitis, Chronic kidney disease (CKD), stage III (moderate) ([place]), Complication of anesthesia, Diabetes mellitus without complication ([place]), GERD (gastroesophageal reflux disease), Glaucoma, Gout, History of blood transfusion, History of kidney stones, History of radiation therapy, Hyperlipidemia, Hypertension, Leukocytes in urine, Lumbar back pain, and Sleep apnea.   Surgical History:   Past Surgical History:  Procedure Laterality Date   APPENDECTOMY  1972   CATARACT EXTRACTION W/PHACO Right 01/10/2014   Procedure: CATARACT EXTRACTION PHACO AND INTRAOCULAR LENS PLACEMENT ([place]);  Surgeon: Tonny Branch, MD;  Location: [facility];  Service: Ophthalmology;  Laterality: Right;  CDE:5.78   CATARACT EXTRACTION W/PHACO Left 01/27/2014   Procedure: CATARACT EXTRACTION PHACO AND INTRAOCULAR LENS PLACEMENT LEFT EYE CDE=5.75;  Surgeon: Tonny Branch, MD;  Location: [facility];  Service: Ophthalmology;  Laterality: Left;   CHOLECYSTECTOMY  1972   COLONOSCOPY WITH PROPOFOL N/A 09/01/2012   Procedure: COLONOSCOPY WITH PROPOFOL;  Surgeon: Garlan Fair, MD;   Location: [facility];  Service: Endoscopy;  Laterality: N/A;   KIDNEY STONE SURGERY     OMENTECTOMY  09/30/2017   Procedure: OMENTECTOMY;  Surgeon: [name], MD;  Location: [facility];  Service: Gynecology;;   ROBOTIC ASSISTED TOTAL HYSTERECTOMY WITH BILATERAL SALPINGO OOPHERECTOMY Bilateral 09/30/2017   Procedure: ROBOTIC ASSISTED TOTAL HYSTERECTOMY WITH BILATERAL SALPINGO OOPHORECTOMY;  Surgeon: [name], MD;  Location: [facility];  Service: Gynecology;  Laterality: Bilateral;     Social History:   reports that she has never smoked. She has never used smokeless tobacco. She reports that she does not drink alcohol and does not use drugs.   Family History:  Her family history includes Asthma in her father; Colon cancer in her father; Stroke in her mother.   Allergies Allergies  Allergen Reactions   Ozempic (0.25 Or 0.5 Mg-Dose) [Semaglutide(0.25 Or 0.24m-Dos)] Nausea And Vomiting   Amoxil [Amoxicillin] Other (See Comments)    Cellulitis    Glucophage [Metformin] Diarrhea   Invokana [Canagliflozin] Other (See Comments)    UTI   Lipitor [Atorvastatin] Other (See Comments)    Myalgias   Lyrica Cr [Pregabalin Er] Other (See Comments)    Dizziness    Other Nausea And Vomiting    Pt states does not know drug but anesthesia drugs have made her have nausea and vomiting.    Sulfa Antibiotics Diarrhea   Trulicity [Dulaglutide] Other (See Comments)    GI Upset   Potassium-Containing Compounds Rash     Home Medications  Prior to Admission medications   Medication Sig Start Date End Date Taking? Authorizing Provider  acetaminophen (TYLENOL) 500 MG tablet Take 1 tablet (500 mg total) by mouth every 4 (four) hours as needed for moderate pain, headache or fever. 11/25/20   Pokhrel, LCorrie Mckusick MD  aspirin EC 81 MG tablet Take 81 mg by mouth daily. Swallow whole.    [provider]  Carboxymethylcellul-Glycerin (LUBRICATING EYE DROPS OP) Place 1 drop into both eyes daily as needed  (irritation).    [provider]  cetirizine (ZYRTEC) 10 MG tablet Take 10 mg by mouth daily.     [provider]  diclofenac Sodium (VOLTAREN) 1 % GEL Apply 2 g topically 4 (four) times daily.    [provider]  fenofibrate 160 MG tablet Take 160 mg by mouth every morning.     [provider]  HUMALOG KWIKPEN 200 UNIT/ML KwikPen Inject 10 Units into the skin daily with supper. 11/18/19   [provider]  hydrocortisone cream 1 % Apply 1 application topically daily as needed for itching.    [provider]  insulin degludec (TRESIBA) 200 UNIT/ML FlexTouch Pen Inject 40 Units into the skin daily with breakfast.    [provider]  lipase/protease/amylase (CREON) 12000-38000 units CPEP capsule Take by mouth.    [provider]  loperamide (IMODIUM A-D) 2 MG tablet Take 4 mg by mouth as needed for diarrhea or loose stools.    [provider]  Melatonin 5 MG TABS Take 5-10 mg by mouth at bedtime as needed (sleep).  [provider]  metoprolol tartrate (LOPRESSOR) 25 MG tablet Take 25 mg by mouth 2 (two) times daily.    [provider]  omeprazole (PRILOSEC) 20 MG capsule Take 20 mg by mouth daily. 08/19/18   [provider]  prochlorperazine (COMPAZINE) 10 MG tablet Take 1 tablet (10 mg total) by mouth every 6 (six) hours as needed for nausea or vomiting. 12/18/17   Gery Pray, MD  simvastatin (ZOCOR) 20 MG tablet Take 20 mg by mouth every evening.    [provider]  telmisartan-hydrochlorothiazide (MICARDIS HCT) 80-25 MG tablet Take 1 tablet by mouth daily. 01/06/18   [provider]  traMADol (ULTRAM) 50 MG tablet Take 1 tablet (50 mg total) by mouth every 12 (twelve) hours as needed for severe pain or moderate pain. 11/25/20 11/25/21  Pokhrel, Corrie Mckusick, MD  vancomycin (VANCOCIN) 125 MG capsule Take 125 mg by mouth 4 (four) times daily.    [provider]     Critical  care time: n/a     Otilio Carpen Rakiya Krawczyk, PA-C [facility] See Amion for pager If no response to pager , please call [phone] until 7pm After 7:00 pm call [place]  [phone]

## 2021-10-05 NOTE — Progress Notes (Signed)
Occupational Therapy Evaluation  PTA pt lived with her husband, occasionally used a cane or RW for mobility and was able to complete her ADL tasks and did some light cooking. Husband assisted with IADL tasks as needed. Able to mobilize to EOB and transfer to recliner with min A using bari RW. Overall mod to max A with LB ADL tasks.VSS. Recommend rehab at SNF however pt wanting to DC home. Acute OT to follow.     10/05/21 1000  OT Visit Information  Last OT Received On 10/05/21  Assistance Needed +2  History of Present Illness The pt is a 76 yo female presenting 7/5 with AMS and after sustaining 2 falls at home. Pt given narcan by EMS with improvement in respirations. Concern for pt accidentially taking extra dose of medications. GCS of 8 upon arrival, pt intubated due to respiratory distress, extubated 7/6. PMH includes: CKD III, DM, GERD, anxiety, gout, HLD, HTN, and obesity.  Precautions  Precautions Fall  Restrictions  Weight Bearing Restrictions No  Home Living  Family/patient expects to be discharged to: Private residence  Living Arrangements Spouse/significant other (step child lives with them and works from home currently.)  Available Help at Discharge Family;Available 24 hours/day  Type of Home House  Home Access Stairs to enter  Entrance Stairs-Number of Steps 4 +1 STE at [place] Two level  Alternate Level Stairs-Number of Steps flight to basement  Alternate Level Stairs-Rails Right  Bathroom Shower/Tub Walk-in shower;Tub/shower unit  Risk manager (2 wheels);Rollator (4 wheels);Cane - single point;Wheelchair - manual  Additional Comments daughter present to confirm  Prior Function  Prior Level of Function  Independent/Modified Independent;Driving;History of Falls (last six months)  Mobility Comments pt walks with RW or cane in the home, uses rollator for community distances  ADLs  Comments takes showers or bird baths, reports independent but increased time  Communication  Communication No difficulties (slowed speech)  Pain Assessment  Pain Assessment Faces  Faces Pain Scale 2  Pain Location B knees  Pain Descriptors / Indicators Discomfort  Pain Intervention(s) Limited activity within patient's tolerance  Cognition  Arousal/Alertness Awake/alert  Behavior During Therapy Flat affect  Overall Cognitive Status Impaired/Different from baseline  Area of Impairment Attention;Following commands;Safety/judgement;Awareness;Problem solving;Orientation;Memory  Orientation Level Disoriented to;Time  Current Attention Level Sustained  Memory Decreased short-term memory  Following Commands Follows one step commands consistently  Safety/Judgement Decreased awareness of safety;Decreased awareness of deficits  Awareness Emergent  Problem Solving Slow processing;Decreased initiation;Difficulty sequencing;Requires verbal cues;Requires tactile cues  General Comments pt with slowed processing; cognition improved from yesterday's session with PT; Pt alert adn oriented to self and place adn situation; states it's June of 2023; most likely cognitive deficits at baseline  Upper Extremity Assessment  Upper Extremity Assessment Generalized weakness  Lower Extremity Assessment  Lower Extremity Assessment Defer to PT evaluation  Cervical / Trunk Assessment  Cervical / Trunk Assessment Other exceptions  Cervical / Trunk Exceptions large body habitus and pannus  Vision- History  Baseline Vision/History 1 Wears glasses  ADL  Overall ADL's  Needs assistance/impaired  Grooming Set up;Supervision/safety  Upper Body Bathing Supervision/ safety;Set up;Sitting  Lower Body Bathing Moderate assistance;Sit to/from stand  Upper Body Dressing  Minimal assistance;Sitting  Lower Body Dressing Moderate assistance;Sit to/from Retail buyer Minimal assistance;Rolling walker (2 wheels)   Toileting- Clothing Manipulation and Hygiene Maximal assistance  Functional mobility during ADLs Minimal assistance;Rolling walker (2 wheels)  General  ADL Comments would most likley benefit form AE for LB ADL  Bed Mobility  Overal bed mobility Needs Assistance  Bed Mobility Supine to Sit;Sit to Supine  Supine to sit HOB elevated;Min assist  Sit to supine Min assist  Transfers  Overall transfer level Needs assistance  Equipment used Rolling walker (2 wheels) (bari)  Transfers Sit to/from Stand  Sit to [employer] safety/equipment  Balance  Overall balance assessment Needs assistance  Sitting-balance support Bilateral upper extremity supported  Sitting balance-Leahy Scale Fair  Sitting balance - Comments at times needing minA, cues to correct posterior LOB  Postural control Posterior lean  Standing balance support Bilateral upper extremity supported  Standing balance-Leahy Scale Poor  Standing balance comment dependent on therapist assist  Exercises  Exercises Other exercises;General Upper Extremity  General Exercises - Upper Extremity  Elbow Extension AROM;Both;10 reps  Shoulder Flexion AROM;Both;15 reps;Seated  Elbow Flexion AROM;Both;10 reps;Seated  OT - End of Session  Equipment Utilized During Treatment Rolling walker (2 wheels);Gait belt  Activity Tolerance Patient tolerated treatment well  Patient left in chair;with call bell/phone within reach;with chair alarm set;with family/visitor present  Nurse Communication Mobility status  OT Assessment  OT Recommendation/Assessment Patient needs continued OT Services  OT Visit Diagnosis Unsteadiness on feet (R26.81);Other abnormalities of gait and mobility (R26.89);Muscle weakness (generalized) (M62.81);History of falling (Z91.81);Other symptoms and signs involving cognitive function;Pain  Pain - part of body Knee (B)  OT Problem List Decreased strength;Decreased range of motion;Decreased activity tolerance;Impaired  balance (sitting and/or standing);Decreased cognition;Decreased safety awareness;Decreased knowledge of use of DME or AE;Decreased knowledge of precautions;Cardiopulmonary status limiting activity;Obesity;Impaired UE functional use;Pain;Increased edema  OT Plan  OT Frequency (ACUTE ONLY) Min 2X/week  OT Treatment/Interventions (ACUTE ONLY) Self-care/ADL training;Therapeutic exercise;Energy conservation;DME and/or AE instruction;Therapeutic activities;Cognitive remediation/compensation;Patient/family education;Balance training  AM-PAC OT "6 Clicks" Daily Activity Outcome Measure (Version 2)  Help from another person eating meals? 4  Help from another person taking care of personal grooming? 3  Help from another person toileting, which includes using toliet, bedpan, or urinal? 2  Help from another person bathing (including washing, rinsing, drying)? 2  Help from another person to put on and taking off regular upper body clothing? 3  Help from another person to put on and taking off regular lower body clothing? 2  6 Click Score 16  Progressive Mobility  What is the highest level of mobility based on the progressive mobility assessment? Level 4 (Walks with assist in room) - Balance while marching in place and cannot step forward and back - Complete  Activity Transferred from bed to chair  OT Recommendation  Follow Up Recommendations Skilled nursing-short term rehab (<3 hours/day)  Assistance recommended at discharge Frequent or constant Supervision/Assistance  Patient can return home with the following A lot of help with walking and/or transfers;A lot of help with bathing/dressing/bathroom;Assistance with cooking/housework;Direct supervision/assist for medications management;Direct supervision/assist for financial management;Assist for transportation;Help with stairs or ramp for entrance  Functional Status Assessent Patient has had a recent decline in their functional status and demonstrates the  ability to make significant improvements in function in a reasonable and predictable amount of time.  OT Equipment None recommended by OT  Individuals Consulted  Consulted and Agree with Results and Recommendations Patient;Family member/caregiver  Family Member Consulted husband  Acute Rehab OT Goals  Patient Stated Goal to go home and not go to SNF  OT Goal Formulation With patient/family  Time For Goal Achievement 10/19/21  Potential to Achieve Goals Good  OT  Time Calculation  OT Start Time (ACUTE ONLY) 1100  OT Stop Time (ACUTE ONLY) 1131  OT Time Calculation (min) 31 min  OT General Charges  $OT Visit 1 Visit  OT Evaluation  $OT Eval Moderate Complexity 1 Mod  OT Treatments  $Self Care/Home Management  8-22 mins  Written Expression  Dominant Hand Right  Maurie Boettcher, OT/L   Acute OT Clinical Specialist Acute Rehabilitation Services Pager [phone] Office [phone]

## 2021-10-05 NOTE — NC FL2 (Signed)
[place] LEVEL OF CARE SCREENING TOOL     IDENTIFICATION  Patient Name: Elizabeth Crosby Birthdate: March 31, 1946 Sex: female Admission Date (Current Location): 10/03/2021  [facility] and [place] Number:  [employer] and Address:  [facility] [place] [place] [address] [place], [place] [address]      Provider Number: [id number]  Attending Physician Name and Address:  Jacky Kindle, MD  Relative Name and Phone Number:       Current Level of Care: Hospital Recommended Level of Care: [place] Prior Approval Number:    Date Approved/Denied:   PASRR Number: [id number]  Discharge Plan: SNF    Current Diagnoses: Patient Active Problem List   Diagnosis Date Noted   Delirium    Respiratory distress    Bacteremia    Acute metabolic encephalopathy 24/23/5361   Acute encephalopathy 11/18/2020   DM2 (diabetes mellitus, type 2) ([place]) 11/18/2020   HTN (hypertension) 11/18/2020   Left lower lobe pneumonia 11/18/2020   History of heart valve abnormality 10/24/2017   Postmenopausal bleeding 09/30/2017   Pelvic mass 09/30/2017   Morbid obesity ([place]) 09/30/2017   Endometrial cancer ([place]) 09/30/2017   Pelvic mass in female 09/30/2017   Acute rhinitis 07/04/2013   Bronchitis, subacute 05/29/2013   GERD (gastroesophageal reflux disease) 05/29/2013    Orientation RESPIRATION BLADDER Height & Weight     Self, Time, Situation, Place  Normal Continent, External catheter Weight: 218 lb 0.6 oz (98.9 kg) Height:  '5\' 1"'$  (154.9 cm)  BEHAVIORAL SYMPTOMS/MOOD NEUROLOGICAL BOWEL NUTRITION STATUS      Continent Diet (See dc summary)  AMBULATORY STATUS COMMUNICATION OF NEEDS Skin   Limited Assist Verbally Normal                       Personal Care Assistance Level of Assistance  Bathing, Feeding, Dressing Bathing Assistance: Maximum assistance Feeding assistance: Independent Dressing Assistance: Limited assistance     Functional  Limitations Info  Speech     Speech Info: Impaired    SPECIAL CARE FACTORS FREQUENCY  PT (By licensed PT), OT (By licensed OT)     PT Frequency: 5x/week OT Frequency: 5x/week            Contractures Contractures Info: Not present    Additional Factors Info  Code Status, Allergies, Insulin Sliding Scale Code Status Info: Full Allergies Info: Ozempic (0.25 Or 0.5 Mg-dose) (Semaglutide(0.25 Or 0.'5mg'$ -dos)), Amoxil (Amoxicillin), Glucophage (Metformin), Invokana (Canagliflozin), Lipitor (Atorvastatin), Lyrica Cr (Pregabalin Er), Other, Sulfa Antibiotics, Trulicity (Dulaglutide), Potassium-containing Compounds   Insulin Sliding Scale Info: See dc summary       Current Medications (10/05/2021):  This is the current hospital active medication list Current Facility-Administered Medications  Medication Dose Route Frequency Provider Last Rate Last Admin   0.9 %  sodium chloride infusion  250 mL Intravenous Continuous Ripley Fraise, MD       0.9 %  sodium chloride infusion   Intravenous PRN Jacky Kindle, MD   Paused at 10/03/21 1557   aspirin chewable tablet 81 mg  81 mg Oral Daily Jacky Kindle, MD   81 mg at 10/05/21 0931   ceFAZolin (ANCEF) IVPB 2g/100 mL premix  2 g Intravenous Q8H Chand, Currie Paris, MD 200 mL/hr at 10/05/21 1616 2 g at 10/05/21 1616   Chlorhexidine Gluconate Cloth 2 % PADS 6 each  6 each Topical Daily Jacky Kindle, MD   6 each at 10/05/21 0931   docusate sodium (COLACE) capsule  100 mg  100 mg Oral BID PRN Jacky Kindle, MD       feeding supplement (ENSURE ENLIVE / ENSURE PLUS) liquid 237 mL  237 mL Oral BID BM Chand, Sudham, MD   237 mL at 10/05/21 1458   heparin injection 5,000 Units  5,000 Units Subcutaneous Q8H Ollis, Brandi L, NP   5,000 Units at 10/05/21 1458   insulin aspart (novoLOG) injection 0-15 Units  0-15 Units Subcutaneous Q4H Ollis, Brandi L, NP   3 Units at 10/05/21 1614   ondansetron (ZOFRAN) injection 4 mg  4 mg Intravenous Q6H PRN Ollis, Brandi L, NP        Oral care mouth rinse  15 mL Mouth Rinse 4 times per day Jacky Kindle, MD   15 mL at 10/05/21 1616   Oral care mouth rinse  15 mL Mouth Rinse PRN Jacky Kindle, MD       pantoprazole (PROTONIX) EC tablet 40 mg  40 mg Oral QHS Chand, Sudham, MD       polyethylene glycol (MIRALAX / GLYCOLAX) packet 17 g  17 g Oral Daily PRN Jacky Kindle, MD       simvastatin (ZOCOR) tablet 20 mg  20 mg Oral QPM Jacky Kindle, MD   20 mg at 10/04/21 1804     Discharge Medications: Please see discharge summary for a list of discharge medications.  Relevant Imaging Results:  Relevant Lab Results:   Additional Information SSN: [id number]. Pt is vaccinated for covid with one booster.  Benard Halsted, LCSW

## 2021-10-05 NOTE — Progress Notes (Signed)
Physical Therapy Treatment Patient Details Name: Elizabeth Crosby MRN: [id number] DOB: 1945/04/15 Today's Date: 10/05/2021   History of Present Illness The pt is a 76 yo female presenting 7/5 with AMS and after sustaining 2 falls at home. Pt given narcan by EMS with improvement in respirations. Concern for pt accidentially taking extra dose of medications. GCS of 8 upon arrival, pt intubated due to respiratory distress, extubated 7/6. PMH includes: CKD III, DM, GERD, anxiety, gout, HLD, HTN, and obesity.    PT Comments    Pt is progressing well towards goals of PT. Pt was pleasant and agreeable to mobility at this time. Pt is min A for bed mobility, transfers, and ambulation. Pt tolerated 5 ft of ambulation with a RW. Pt was min A for step pivot to [facility] and lateral step transfer from [facility] to bed, increased time during session for pt expressing need to have a BM. Pt continues to present with decreased tolerance to activity, mild balance deficits, and decreased strength. For these reasons, pt would continue to benefit from skilled PT. Will continue to follow acutely.    Recommendations for follow up therapy are one component of a multi-disciplinary discharge planning process, led by the attending physician.  Recommendations may be updated based on patient status, additional functional criteria and insurance authorization.  Follow Up Recommendations  Skilled nursing-short term rehab (<3 hours/day) Can patient physically be transported by private vehicle: No   Assistance Recommended at Discharge Frequent or constant Supervision/Assistance  Patient can return home with the following A lot of help with walking and/or transfers;A lot of help with bathing/dressing/bathroom;Assistance with cooking/housework;Assistance with feeding;Direct supervision/assist for medications management;Direct supervision/assist for financial management;Help with stairs or ramp for entrance;Assist for transportation   Equipment  Recommendations  None recommended by PT    Recommendations for Other Services       Precautions / Restrictions Precautions Precautions: Fall Restrictions Weight Bearing Restrictions: No     Mobility  Bed Mobility Overal bed mobility: Needs Assistance Bed Mobility: Supine to Sit, Sit to Supine     Supine to sit: HOB elevated, Min assist Sit to supine: Min assist   General bed mobility comments: min A for bed mobility with cues for LE manuevering and hand placement on rail during supine to sit. min A for sit to supine to swing BLE, pt able to instinctively straighten hips once supine    Transfers Overall transfer level: Needs assistance Equipment used: Rolling walker (2 wheels) Transfers: Sit to/from Stand, Bed to chair/wheelchair/BSC Sit to Stand: Min assist, +2 physical assistance, +2 safety/equipment           General transfer comment: from EOB x1, from chair x1, from [facility] x1. pt with min A to stand and cues for pushing off seated support, cues for hand placement on RW. cues for upright posture upon standing, increased trunk flexion in standing. min A for step pivot and lateral step transfer for safety and RW manuevering. pt pulling on walker to stand despite max cues to push off seated support.    Ambulation/Gait Ambulation/Gait assistance: Min assist Gait Distance (Feet): 5 Feet Assistive device: Rolling walker (2 wheels) Gait Pattern/deviations: Step-to pattern, Decreased stride length Gait velocity: decreased Gait velocity interpretation: <1.31 ft/sec, indicative of household ambulator   General Gait Details: pt with slow and mildly unsteady gait, cues for safe use of RW. min A for stability and Barrister's clerk  Modified Rankin (Stroke Patients Only)       Balance Overall balance assessment: Needs assistance Sitting-balance support: Single extremity supported, Feet supported Sitting balance-Leahy Scale:  Fair Sitting balance - Comments: able to support self with 1 UE, cues for upright posture and correct posterior lean Postural control: Posterior lean Standing balance support: Bilateral upper extremity supported Standing balance-Leahy Scale: Poor Standing balance comment: needs at least 1 UE support while standing, however heavily relied on RW while standing                            Cognition Arousal/Alertness: Awake/alert Behavior During Therapy: Flat affect Overall Cognitive Status: Impaired/Different from baseline Area of Impairment: Attention, Following commands, Safety/judgement, Awareness, Problem solving, Orientation, Memory                 Orientation Level: Disoriented to, Time (unsure of day of the week, said it was a day or two after the 4th.) Current Attention Level: Sustained Memory: Decreased short-term memory Following Commands: Follows one step commands consistently Safety/Judgement: Decreased awareness of safety, Decreased awareness of deficits Awareness: Emergent, Intellectual (intellectual with emergent tendencies, understands need for stability with RW, unable to use properly at times) Problem Solving: Slow processing, Decreased initiation, Difficulty sequencing, Requires verbal cues, Requires tactile cues General Comments: slow processing and expression of words. oriented to self, place, and situation. could not say what day of the week it was or the date, correctly identified July 2023.        Exercises      General Comments        Pertinent Vitals/Pain Pain Assessment Pain Assessment: No/denies pain    Home Living                          Prior Function            PT Goals (current goals can now be found in the care plan section) Acute Rehab PT Goals Patient Stated Goal: to be more independent prior to return home PT Goal Formulation: With patient Time For Goal Achievement: 10/18/21 Potential to Achieve Goals:  Good Progress towards PT goals: Progressing toward goals    Frequency    Min 3X/week      PT Plan Current plan remains appropriate    Co-evaluation              AM-PAC PT "6 Clicks" Mobility   Outcome Measure  Help needed turning from your back to your side while in a flat bed without using bedrails?: A Little Help needed moving from lying on your back to sitting on the side of a flat bed without using bedrails?: A Little Help needed moving to and from a bed to a chair (including a wheelchair)?: A Little Help needed standing up from a chair using your arms (e.g., wheelchair or bedside chair)?: A Little Help needed to walk in hospital room?: Total Help needed climbing 3-5 steps with a railing? : Total 6 Click Score: 14    End of Session Equipment Utilized During Treatment: Gait belt Activity Tolerance: Patient tolerated treatment well;Patient limited by fatigue Patient left: in bed;with call bell/phone within reach;with bed alarm set;with family/visitor present Nurse Communication: Mobility status PT Visit Diagnosis: Unsteadiness on feet (R26.81);Repeated falls (R29.6);Muscle weakness (generalized) (M62.81)     Time: 6948-5462 PT Time Calculation (min) (ACUTE ONLY): 28 min  Charges:  $Therapeutic Activity: 23-37 mins  Havery Moros, [place], SPT Acute Rehabilitation Services Office: [place] 10/05/2021, 4:19 PM

## 2021-10-05 NOTE — Progress Notes (Signed)
Speech Language Pathology Treatment: Dysphagia  Patient Details Name: Elizabeth Crosby MRN: [id number] DOB: [birth year] Today's Date: 10/05/2021 Time: 1202-1216 SLP Time Calculation (min) (ACUTE ONLY): 14 min  Assessment / Plan / Recommendation Clinical Impression  Pt was seen for dysphagia treatment with her husband present. Pt reported that she has been tolerating the meals well and pt's husband stated that pt has had this cough for a while. Pt tolerated regular texture solids without overt s/sx of aspiration. Mastication and oral clearance were WFL. Pt consistently exhibited coughing with thin liquids via straw. Coughing was reduced, but not eliminated with thin liquids via cup. Pt's diet will be advanced to dysphagia 3 solids and nectar thick liquids will be continued. A modified barium swallow study is recommended to further assess swallow function.   HPI HPI: Patient is a 76 y.o. female with PMH: DM, GERD anxiety, gout, glaucoma, HTN, OSA nonadherent with CPAP, CKD III. She presented to the hospital via EMS on 10/03/21 from home after husband heard her fall in middle of the night and she was acting as if "drunk" but no ETOH suspected. Husband had reported that she has gotten a couple of her meds mixed up in the past and taken too much by accident. In ER, GCS was 8 and she had respiratory distress. She was intubated for airway protection on 7/5. UDS was negative, CT head negative for acute intracranial process, CXR showed right greater than left interstitial edema, effusions. She was extubated 10/04/21 at 0929 to 2L [place]. RN concerned about patient's persistent coughing with PO's, prompting swallow eval order with SLP.      SLP Plan  MBS      Recommendations for follow up therapy are one component of a multi-disciplinary discharge planning process, led by the attending physician.  Recommendations may be updated based on patient status, additional functional criteria and insurance authorization.     Recommendations  Diet recommendations: Dysphagia 3 (mechanical soft);Nectar-thick liquid Liquids provided via: Cup;Straw Medication Administration: Whole meds with puree Supervision: Patient able to self feed Compensations: Slow rate;Small sips/bites;Minimize environmental distractions Postural Changes and/or Swallow Maneuvers: Seated upright 90 degrees                Oral Care Recommendations: Oral care BID;Staff/trained caregiver to provide oral care Follow Up Recommendations: Skilled nursing-short term rehab (<3 hours/day) SLP Visit Diagnosis: Dysphagia, unspecified (R13.10) Plan: MBS        Yuta Cipollone I. Hardin Negus, [place], [place] Office number [phone] Pager [place]  10/05/2021, 3:13 PM

## 2021-10-05 NOTE — TOC Initial Note (Signed)
Transition of Care [facility]) - Initial/Assessment Note    Patient Details  Name: Elizabeth Crosby MRN: [id number] Date of Birth: [birth year]  Transition of Care [facility]) CM/SW Contact:    Benard Halsted, LCSW Phone Number: 10/05/2021, 5:05 PM  Clinical Narrative:                 CSW received consult for possible SNF placement at time of discharge. CSW spoke with patient and spouse at bedside. Patient reported that patient's spouse is currently unable to care for patient at their home given patient's current physical needs and fall risk. Patient expressed understanding of PT recommendation and is agreeable to SNF placement at time of discharge. Patient reports preference likely for [facility] since she has been there before and it is closest to their home for spouse but she is willing to review the offers. CSW discussed insurance authorization process and will provide Medicare SNF ratings list. Patient has received COVID vaccine. CSW will send out referrals for review. Patient expressed being hopeful for rehab and to feel better soon. No further questions reported at this time.   Skilled Nursing Rehab Facilities-   [email or web address]   Ratings out of 5 possible   Name Address  Phone # [facility] Inspection Overall  [facility] [address], [place] '4 5 2 3  '$ [facility]  [address], [place] [phone] '3 2 5 5  '$ [facility] [place], [place] '3 1 1 1  '$ [facility] [place], [place] '3 2 4 4  '$ [facility] [address] [place] '1 1 2 1  '$ [facility] [address], [place] '2 1 4 3  '$ [facility] [address], [place] '5 2 3 4  '$ [facility] [address] [place] '5 2 2 3  '$ [address] ([place]) [place], [place] [phone] '5 1 2 2   '$ [facility] [address], [place] [phone] '4 1 2 1  '$ [facility] [address] [facility] [phone] '4 1 2 1  '$ [facility] [place]. [place], [place] [phone] '4 1 1 1  '$ [facility]in Flock [address] [phone] '3 2 4 4          '$ [facility] [address], [address] [phone]      [facility] [place] '4 2 3 3  '$ [facility] [place] [address], [place] '4 1 5 4  '$ [facility], [place] [address], [place] [phone] '2 1 1 1  '$ [facility] [address] [facility] [phone] '2 1 3 2          '$ [address] (no [facility]) [place] [facility], [place] [phone] '4 5 5 5  '$ [facility] (No Hum[employer]) [address], [place] '3 1 4 3  '$ [facility] (No [facility]) [place], [place] [phone] '5 5 5 5  '$ [facility] [address], [facility] [phone] '3 2 4 4  '$ [place] [address], [place] '1 1 2 1  '$ [facility] [address], [address] '2 1 1 1  '$ [facility] [place], [place] '5 2 4 5  '$ [facility] [address], [place] '3 1 1 1  '$ [facility] [place], [place] '2 1 2 '$ 1  [facility] [address], [place] [phone] '1 1 1 1  '$ [facility] [address] [place]  [phone] '2 4 2 2  '$ [facility] [address], [place] [phone] '5 2 3 4  '$ [facility] [address], [place] '2 1 1 1  '$ [place] (No Humana) [address] [place] [phone] '2 1 3 2  '$ [facility] [address] [place] [phone] '3 1 1 1          '$ [facility] [place], [place] '5 4 5 5  '$ [facility])  [address], [place] '2 2 3 3  '$ [facility]) [place]  [address] [place] '3 2 4 4  '$ [place] [address] [place] '4 3 4 4  '$ [address] [place], [place] '3 3 1 1  '$ [place] [facility]) [address] [place] [phone] '2 2 4 4     '$ Expected Discharge Plan: [place] Barriers to Discharge: Continued Medical Work up, Ship broker, SNF Pending bed offer   Patient Goals and CMS Choice Patient states their goals for this hospitalization and ongoing recovery are:: Rehab CMS Medicare.gov Compare Post Acute Care list provided to:: Patient Choice offered to / list presented to : Patient, Spouse  Expected Discharge Plan and Services Expected Discharge Plan: [place] In-house Referral: Clinical Social Work   Post Acute Care Choice: [place] Living arrangements for the past 2 months: [place]                                      Prior Living Arrangements/Services Living arrangements for the past 2 months: Single Family Home Lives with:: Spouse Patient language and need for interpreter reviewed:: Yes Do you feel safe going back to the place where you live?: Yes      Need for Family Participation in Patient Care: Yes (Comment) Care giver support system in place?: Yes (comment)   Criminal Activity/Legal Involvement Pertinent to Current Situation/Hospitalization: No - Comment as needed  Activities of Daily Living      Permission Sought/Granted Permission sought to share information with : Facility Sport and exercise psychologist, Family Supports Permission granted to share information with : Yes, Verbal Permission Granted  Share Information with NAME: [name]  Permission granted to share info w AGENCY: SNFs  Permission granted to share info w Relationship: Spouse  Permission granted to share info w Contact Information: [phone]  Emotional Assessment Appearance:: Appears stated  age Attitude/Demeanor/Rapport: Engaged Affect (typically observed): Accepting, Appropriate, Pleasant Orientation: : Oriented to Self, Oriented to Place, Oriented to  Time, Oriented to Situation Alcohol / Substance Use: Not Applicable Psych Involvement: No (comment)  Admission diagnosis:  Delirium [R41.0] Respiratory distress [D82.64] Acute metabolic encephalopathy [B58.30] Patient Active Problem List   Diagnosis Date Noted   Delirium    Respiratory distress    Bacteremia    Acute metabolic encephalopathy 94/09/6806   Acute encephalopathy 11/18/2020   DM2 (diabetes mellitus, type 2) ([place]) 11/18/2020   HTN (hypertension) 11/18/2020   Left lower lobe pneumonia 11/18/2020   History of heart valve abnormality 10/24/2017   Postmenopausal bleeding 09/30/2017   Pelvic mass 09/30/2017   Morbid obesity ([place]) 09/30/2017   Endometrial cancer ([place]) 09/30/2017   Pelvic mass in female 09/30/2017   Acute rhinitis 07/04/2013  Bronchitis, subacute 05/29/2013   GERD (gastroesophageal reflux disease) 05/29/2013   PCP:  [name], MD Pharmacy[facility]   [id number] [place] [address]Phone: [phone]: [phone]    Social Determinants of Health (SDOH) Interventions    Readmission Risk Interventions     No data to display

## 2021-10-06 DIAGNOSIS — N179 Acute kidney failure, unspecified: Secondary | ICD-10-CM

## 2021-10-06 DIAGNOSIS — G9341 Metabolic encephalopathy: Secondary | ICD-10-CM | POA: Diagnosis not present

## 2021-10-06 DIAGNOSIS — W19XXXA Unspecified fall, initial encounter: Secondary | ICD-10-CM

## 2021-10-06 LAB — GLUCOSE, CAPILLARY
Glucose-Capillary: 117 mg/dL — ABNORMAL HIGH (ref 70–99)
Glucose-Capillary: 126 mg/dL — ABNORMAL HIGH (ref 70–99)
Glucose-Capillary: 174 mg/dL — ABNORMAL HIGH (ref 70–99)
Glucose-Capillary: 229 mg/dL — ABNORMAL HIGH (ref 70–99)

## 2021-10-06 LAB — CBC
HCT: 27.3 % — ABNORMAL LOW (ref 36.0–46.0)
Hemoglobin: 9.1 g/dL — ABNORMAL LOW (ref 12.0–15.0)
MCH: 31.8 pg (ref 26.0–34.0)
MCHC: 33.3 g/dL (ref 30.0–36.0)
MCV: 95.5 fL (ref 80.0–100.0)
Platelets: 116 10*3/uL — ABNORMAL LOW (ref 150–400)
RBC: 2.86 MIL/uL — ABNORMAL LOW (ref 3.87–5.11)
RDW: 14.4 % (ref 11.5–15.5)
WBC: 6.9 10*3/uL (ref 4.0–10.5)
nRBC: 0 % (ref 0.0–0.2)

## 2021-10-06 LAB — CULTURE, BLOOD (ROUTINE X 2): Special Requests: ADEQUATE

## 2021-10-06 LAB — BASIC METABOLIC PANEL
Anion gap: 4 — ABNORMAL LOW (ref 5–15)
BUN: 30 mg/dL — ABNORMAL HIGH (ref 8–23)
CO2: 24 mmol/L (ref 22–32)
Calcium: 8 mg/dL — ABNORMAL LOW (ref 8.9–10.3)
Chloride: 115 mmol/L — ABNORMAL HIGH (ref 98–111)
Creatinine, Ser: 1.7 mg/dL — ABNORMAL HIGH (ref 0.44–1.00)
GFR, Estimated: 31 mL/min — ABNORMAL LOW (ref >60)
Glucose, Bld: 118 mg/dL — ABNORMAL HIGH (ref 70–99)
Potassium: 4.1 mmol/L (ref 3.5–5.1)
Sodium: 143 mmol/L (ref 135–145)

## 2021-10-06 MED ORDER — METOPROLOL SUCCINATE ER 25 MG PO TB24
25.0000 mg | ORAL_TABLET | Freq: Every day | ORAL | Status: DC
  Administered 2021-10-06 – 2021-10-09 (×4): 25 mg via ORAL
  Filled 2021-10-06 (×4): qty 1

## 2021-10-06 MED ORDER — FENOFIBRATE 160 MG PO TABS
160.0000 mg | ORAL_TABLET | Freq: Every morning | ORAL | Status: DC
  Administered 2021-10-06 – 2021-10-09 (×4): 160 mg via ORAL
  Filled 2021-10-06 (×4): qty 1

## 2021-10-06 MED ORDER — PANCRELIPASE (LIP-PROT-AMYL) 36000-114000 UNITS PO CPEP: 36000.0000 [IU] | ORAL_CAPSULE | ORAL | Status: DC | PRN

## 2021-10-06 MED ORDER — PANTOPRAZOLE SODIUM 40 MG PO TBEC: 40.0000 mg | DELAYED_RELEASE_TABLET | Freq: Every day | ORAL | Status: DC

## 2021-10-06 MED ORDER — FUROSEMIDE 20 MG PO TABS
20.0000 mg | ORAL_TABLET | Freq: Two times a day (BID) | ORAL | Status: DC
  Administered 2021-10-06 – 2021-10-09 (×6): 20 mg via ORAL
  Filled 2021-10-06 (×6): qty 1

## 2021-10-06 MED ORDER — PANCRELIPASE (LIP-PROT-AMYL) 36000-114000 UNITS PO CPEP
72000.0000 [IU] | ORAL_CAPSULE | Freq: Two times a day (BID) | ORAL | Status: DC
  Administered 2021-10-06 – 2021-10-09 (×6): 72000 [IU] via ORAL
  Filled 2021-10-06 (×6): qty 2

## 2021-10-06 MED ORDER — FENOFIBRATE 160 MG PO TABS
160.0000 mg | ORAL_TABLET | Freq: Every morning | ORAL | Status: DC
Start: 2021-10-06 — End: 2021-10-06

## 2021-10-06 MED ORDER — ASPIRIN 81 MG PO TBEC
81.0000 mg | DELAYED_RELEASE_TABLET | Freq: Every day | ORAL | Status: DC
  Administered 2021-10-07 – 2021-10-09 (×3): 81 mg via ORAL
  Filled 2021-10-06 (×3): qty 1

## 2021-10-06 MED ORDER — ASPIRIN 81 MG PO TBEC: 81.0000 mg | DELAYED_RELEASE_TABLET | Freq: Every day | ORAL | Status: DC

## 2021-10-06 MED ORDER — INSULIN ASPART 100 UNIT/ML IJ SOLN
0.0000 [IU] | Freq: Three times a day (TID) | INTRAMUSCULAR | Status: DC
  Administered 2021-10-06: 3 [IU] via SUBCUTANEOUS
  Administered 2021-10-07 (×3): 2 [IU] via SUBCUTANEOUS
  Administered 2021-10-08: 3 [IU] via SUBCUTANEOUS
  Administered 2021-10-08: 2 [IU] via SUBCUTANEOUS
  Administered 2021-10-09: 3 [IU] via SUBCUTANEOUS

## 2021-10-06 MED ORDER — INSULIN ASPART 100 UNIT/ML IJ SOLN
0.0000 [IU] | Freq: Every day | INTRAMUSCULAR | Status: DC
  Administered 2021-10-06: 2 [IU] via SUBCUTANEOUS

## 2021-10-06 MED ORDER — FAMOTIDINE 20 MG PO TABS
20.0000 mg | ORAL_TABLET | Freq: Two times a day (BID) | ORAL | Status: DC
Start: 2021-10-06 — End: 2021-10-08
  Administered 2021-10-06 – 2021-10-08 (×5): 20 mg via ORAL
  Filled 2021-10-06 (×5): qty 1

## 2021-10-06 NOTE — Plan of Care (Signed)
  Problem: Education: Goal: Ability to describe self-care measures that may prevent or decrease complications (Diabetes Survival Skills Education) will improve Outcome: Progressing Goal: Individualized Educational Video(s) Outcome: Progressing   Problem: Coping: Goal: Ability to adjust to condition or change in health will improve Outcome: Progressing   Problem: Fluid Volume: Goal: Ability to maintain a balanced intake and output will improve Outcome: Progressing   Problem: Health Behavior/Discharge Planning: Goal: Ability to identify and utilize available resources and services will improve Outcome: Progressing Goal: Ability to manage health-related needs will improve Outcome: Progressing   Problem: Metabolic: Goal: Ability to maintain appropriate glucose levels will improve Outcome: Progressing   Problem: Nutritional: Goal: Maintenance of adequate nutrition will improve Outcome: Progressing Goal: Progress toward achieving an optimal weight will improve Outcome: Progressing   Problem: Skin Integrity: Goal: Risk for impaired skin integrity will decrease Outcome: Progressing   Problem: Tissue Perfusion: Goal: Adequacy of tissue perfusion will improve Outcome: Progressing   Problem: Safety: Goal: Non-violent Restraint(s) Outcome: Progressing   Problem: Education: Goal: Knowledge of General Education information will improve Description: Including pain rating scale, medication(s)/side effects and non-pharmacologic comfort measures Outcome: Progressing   Problem: Health Behavior/Discharge Planning: Goal: Ability to manage health-related needs will improve Outcome: Progressing   Problem: Clinical Measurements: Goal: Ability to maintain clinical measurements within normal limits will improve Outcome: Progressing Goal: Will remain free from infection Outcome: Progressing Goal: Diagnostic test results will improve Outcome: Progressing Goal: Respiratory complications  will improve Outcome: Progressing Goal: Cardiovascular complication will be avoided Outcome: Progressing   Problem: Activity: Goal: Risk for activity intolerance will decrease Outcome: Progressing   Problem: Nutrition: Goal: Adequate nutrition will be maintained Outcome: Progressing   Problem: Coping: Goal: Level of anxiety will decrease Outcome: Progressing   Problem: Elimination: Goal: Will not experience complications related to bowel motility Outcome: Progressing Goal: Will not experience complications related to urinary retention Outcome: Progressing   Problem: Pain Managment: Goal: General experience of comfort will improve Outcome: Progressing   Problem: Safety: Goal: Ability to remain free from injury will improve Outcome: Progressing   Problem: Skin Integrity: Goal: Risk for impaired skin integrity will decrease Outcome: Progressing   

## 2021-10-06 NOTE — Progress Notes (Signed)
PROGRESS NOTE    Elizabeth Crosby:[id number] DOB: 1945-05-13 DOA: 10/03/2021 PCP: [name], MD   Brief Narrative:  76 y/o F who presented to [facility] ER on 7/5 with reports of altered mental status and respiratory distress.    The patient presented to [facility] via EMS.  Her husband reports she got up in the middle of the night to go to the restroom and he heard her fall.  He reported she acted as if she was "drunk" but no ETOH suspected. He found her on her knees. She takes tramadol and tizanidine and has gotten her meds mixed up in the past and taken too much on accident.  On presentation to the ER, her GCS was 8 and she had respiratory distress. She was intubated for airway protection - reported to have desaturated with RSI, 2 attempts. UDS negative. CT Head negative for acute process. BNP pending.  Admitted under PCCM, extubated 10/05/2021 and transferred to [facility] on 10/06/2021.   PCCM called for ICU admission.    Daughter, Elizabeth Crosby [phone]), reports she occasionally will get her meds mixed up and she notes the patient to have slurring of her words.  The patient typically avoids going to the MD if possible.  She was recently seen by her PCP for routine check up and had a good report.  She notes that her "pancreas doesn't work well and she was recently started on meds to help".      Assessment & Plan:   Principal Problem:   Acute metabolic encephalopathy Active Problems:   Morbid obesity ([place])   DM2 (diabetes mellitus, type 2) ([place])   Delirium   Respiratory distress   Bacteremia   Acute renal failure superimposed on stage 3a chronic kidney disease ([place])   Accident due to mechanical fall without injury  Acute Metabolic Encephalopathy: In setting of suspected accidental medication mix up, possible aspiration PNA. CT head/neck negative for acute process.  -initially intubated 7/5, extubated 7/6 - GCS 8 on presentation. Hx of similar admission in August.  UDS, ETOH  negative. -worked with PT, they recommend SNF, TOC on board.  Pending placement.   Acute Hypoxic Respiratory Failure in setting of Bilateral Infiltrates vs Effusions R>L OSA/aspiration pneumonia:  Staph bacteremia, Blood culture x1 positive for staph lugdensis, De-escalated abx to Cefazolin, echo reassuring, no artificial heart valves or implanted cardiac devices to increase risk of endocarditis, repeat blood culture negative.  AKI on CKD IIIa Baseline sr cr ~ 1.1-1.2, presented with creatinine of 1.67 which has remained stable.  Wonder if this is going to be her new baseline.  Continue to avoid nephrotoxic agents.   Sedation Related Hypotension  Noted in ER on propofol. Improved with cessation of propofol.  -resolved off sedation   Mechanical Fall  CT head, neck negative for acute process  -in the setting of encephalopathy -PT eval recommended SNF placement.  History of grade 1 diastolic dysfunction/bilateral lower extremity edema: We will resume Lasix 20 mg p.o. daily that she was taking at home.  Type 2 diabetes mellitus: Blood sugar controlled on SSI.  Hypertension: Controlled.  Continue Toprol-XL.  Continue to hold telmisartan-HCTZ in the setting of AKI.  Morbid obesity: Weight loss counseled.  DVT prophylaxis: heparin injection 5,000 Units Start: 10/03/21 0815 SCDs Start: 10/03/21 0808   Code Status: Full Code  Family Communication: Husband and daughter present at bedside.  Plan of care discussed with patient in length and he/she verbalized understanding and agreed with it.  Status is:  Inpatient Remains inpatient appropriate because: Pending SNF placement.  Medically stable.   Estimated body mass index is 41.2 kg/m as calculated from the following:   Height as of this encounter: '5\' 1"'$  (1.549 m).   Weight as of this encounter: 98.9 kg.    Nutritional Assessment: Body mass index is 41.2 kg/m.Marland Kitchen Seen by dietician.  I agree with the assessment and plan as outlined  below: Nutrition Status: Nutrition Problem: Inadequate oral intake Etiology: dysphagia Signs/Symptoms: meal completion < 50% Interventions: Ensure Enlive (each supplement provides 350kcal and 20 grams of protein)  . Skin Assessment: I have examined the patient's skin and I agree with the wound assessment as performed by the wound care RN as outlined below:    Consultants:  None  Procedures:  None  Antimicrobials:  Anti-infectives (From admission, onward)    Start     Dose/Rate Route Frequency Ordered Stop   10/05/21 0800  ceFAZolin (ANCEF) IVPB 2g/100 mL premix        2 g 200 mL/hr over 30 Minutes Intravenous Every 8 hours 10/04/21 0953     10/04/21 0515  vancomycin (VANCOREADY) IVPB 1500 mg/300 mL        1,500 mg 150 mL/hr over 120 Minutes Intravenous  Once 10/04/21 0423 10/04/21 0658   10/03/21 0830  cefTRIAXone (ROCEPHIN) 2 g in sodium chloride 0.9 % 100 mL IVPB  Status:  Discontinued        2 g 200 mL/hr over 30 Minutes Intravenous Every 24 hours 10/03/21 0815 10/04/21 0953   10/03/21 0830  azithromycin (ZITHROMAX) 500 mg in sodium chloride 0.9 % 250 mL IVPB  Status:  Discontinued        500 mg 250 mL/hr over 60 Minutes Intravenous Every 24 hours 10/03/21 0815 10/04/21 0953         Subjective: Patient seen and examined.  She is fully alert and oriented and has no complaints.  Daughter and husband at the bedside.  Objective: Vitals:   10/05/21 2026 10/05/21 2310 10/06/21 0713 10/06/21 1112  BP: (!) 137/47 (!) 130/47 137/66 (!) 132/57  Pulse: 82  76 90  Resp: '17 15 18 19  '$ Temp: 98.6 F (37 C) 98.4 F (36.9 C) 97.7 F (36.5 C) 98 F (36.7 C)  TempSrc: Oral Oral Oral Oral  SpO2: 99% 99% 99% 99%  Weight:      Height:       No intake or output data in the 24 hours ending 10/06/21 1400 Filed Weights   10/03/21 0517  Weight: 98.9 kg    Examination:  General exam: Appears calm and comfortable  Respiratory system: Clear to auscultation. Respiratory effort  normal. Cardiovascular system: S1 & S2 heard, RRR. No JVD, murmurs, rubs, gallops or clicks.  +2 pitting edema bilateral lower extremity. Gastrointestinal system: Abdomen is nondistended, soft and nontender. No organomegaly or masses felt. Normal bowel sounds heard. Central nervous system: Alert and oriented. No focal neurological deficits. Extremities: Symmetric 5 x 5 power. Skin: No rashes, lesions or ulcers Psychiatry: Judgement and insight appear normal. Mood & affect appropriate.    Data Reviewed: I have personally reviewed following labs and imaging studies  CBC: Recent Labs  Lab 10/03/21 0531 10/03/21 0611 10/03/21 0903 10/04/21 0416 10/05/21 0312 10/06/21 0149  WBC 6.8  --   --  13.0* 9.6 6.9  NEUTROABS 4.7  --   --   --   --   --   HGB 10.9* 10.2*  9.9* 8.8* 9.8* 8.9* 9.1*  HCT 32.0* 30.0*  29.0* 26.0* 30.3* 28.3* 27.3*  MCV 95.2  --   --  98.4 98.3 95.5  PLT 159  --   --  153 123* 604*   Basic Metabolic Panel: Recent Labs  Lab 10/03/21 0531 10/03/21 0611 10/03/21 0903 10/03/21 1018 10/03/21 1459 10/04/21 0416 10/04/21 1532 10/05/21 0312 10/06/21 0149  NA 149* 149*  150* 148*  --   --  145  --  143 143  K 3.9 3.9  3.9 3.6  --   --  4.5  --  3.9 4.1  CL 117* 116*  --   --   --  117*  --  111 115*  CO2 20*  --   --   --   --  21*  --  22 24  GLUCOSE 151* 145*  --   --   --  210*  --  69* 118*  BUN 23 20  --   --   --  24*  --  27* 30*  CREATININE 1.67* 1.60*  --   --   --  1.65*  --  1.78* 1.70*  CALCIUM 8.4*  --   --   --   --  7.9*  --  8.0* 8.0*  MG  --   --   --  1.5* 1.6* 2.1 2.0  --   --   PHOS  --   --   --  2.7 2.7 3.0 3.1  --   --    GFR: Estimated Creatinine Clearance: 30.8 mL/min (A) (by C-G formula based on SCr of 1.7 mg/dL (H)). Liver Function Tests: Recent Labs  Lab 10/03/21 0531  AST 40  ALT 20  ALKPHOS 70  BILITOT 1.7*  PROT 5.3*  ALBUMIN 2.7*   No results for input(s): "LIPASE", "AMYLASE" in the last 168 hours. No results for  input(s): "AMMONIA" in the last 168 hours. Coagulation Profile: No results for input(s): "INR", "PROTIME" in the last 168 hours. Cardiac Enzymes: No results for input(s): "CKTOTAL", "CKMB", "CKMBINDEX", "TROPONINI" in the last 168 hours. BNP (last 3 results) No results for input(s): "PROBNP" in the last 8760 hours. HbA1C: No results for input(s): "HGBA1C" in the last 72 hours. CBG: Recent Labs  Lab 10/05/21 1600 10/05/21 2027 10/05/21 2354 10/06/21 0345 10/06/21 0808  GLUCAP 196* 216* 133* 117* 126*   Lipid Profile: No results for input(s): "CHOL", "HDL", "LDLCALC", "TRIG", "CHOLHDL", "LDLDIRECT" in the last 72 hours. Thyroid Function Tests: No results for input(s): "TSH", "T4TOTAL", "FREET4", "T3FREE", "THYROIDAB" in the last 72 hours. Anemia Panel: No results for input(s): "VITAMINB12", "FOLATE", "FERRITIN", "TIBC", "IRON", "RETICCTPCT" in the last 72 hours. Sepsis Labs: Recent Labs  Lab 10/03/21 0537 10/03/21 1018 10/03/21 1459 10/04/21 0416 10/05/21 0312  PROCALCITON  --  0.10  --  0.27 0.44  LATICACIDVEN 2.2* 1.5 1.2  --   --     Recent Results (from the past 240 hour(s))  Blood Culture (Routine X 2)     Status: Abnormal   Collection Time: 10/03/21  5:33 AM   Specimen: BLOOD  Result Value Ref Range Status   Specimen Description BLOOD SITE NOT SPECIFIED  Final   Special Requests   Final    BOTTLES DRAWN AEROBIC AND ANAEROBIC Blood Culture adequate volume   Culture  Setup Time   Final    GRAM POSITIVE COCCI AEROBIC BOTTLE ONLY CRITICAL RESULT CALLED TO, READ BACK BY AND VERIFIED WITH: T RUDISILL,PHARMD'@0359'$  10/04/21 mk Performed at [facility]  Lab, [address] [place], [place] [address]    Culture STAPHYLOCOCCUS LUGDUNENSIS (A)  Final   Report Status 10/06/2021 FINAL  Final   Organism ID, Bacteria STAPHYLOCOCCUS LUGDUNENSIS  Final      Susceptibility   Staphylococcus lugdunensis - MIC*    CIPROFLOXACIN 4 RESISTANT Resistant     ERYTHROMYCIN >=8  RESISTANT Resistant     GENTAMICIN <=0.5 SENSITIVE Sensitive     OXACILLIN <=0.25 SENSITIVE Sensitive     TETRACYCLINE >=16 RESISTANT Resistant     VANCOMYCIN 1 SENSITIVE Sensitive     TRIMETH/SULFA <=10 SENSITIVE Sensitive     CLINDAMYCIN RESISTANT Resistant     RIFAMPIN <=0.5 SENSITIVE Sensitive     Inducible Clindamycin POSITIVE Resistant     * STAPHYLOCOCCUS LUGDUNENSIS  Blood Culture (Routine X 2)     Status: None (Preliminary result)   Collection Time: 10/03/21  5:33 AM   Specimen: BLOOD  Result Value Ref Range Status   Specimen Description BLOOD SITE NOT SPECIFIED  Final   Special Requests   Final    BOTTLES DRAWN AEROBIC AND ANAEROBIC Blood Culture results may not be optimal due to an excessive volume of blood received in culture bottles   Culture   Final    NO GROWTH 3 DAYS Performed at [facility], [place] [address] [place], [place] [address]    Report Status PENDING  Incomplete  Blood Culture ID Panel (Reflexed)     Status: Abnormal   Collection Time: 10/03/21  5:33 AM  Result Value Ref Range Status   Enterococcus faecalis NOT DETECTED NOT DETECTED Final   Enterococcus Faecium NOT DETECTED NOT DETECTED Final   Listeria monocytogenes NOT DETECTED NOT DETECTED Final   Staphylococcus species DETECTED (A) NOT DETECTED Final    Comment: CRITICAL RESULT CALLED TO, READ BACK BY AND VERIFIED WITH: T RUDISILL,PHARMD'@0400'$  10/04/21 [place]    Staphylococcus aureus (BCID) NOT DETECTED NOT DETECTED Final   Staphylococcus epidermidis NOT DETECTED NOT DETECTED Final   Staphylococcus lugdunensis DETECTED (A) NOT DETECTED Final    Comment: CRITICAL RESULT CALLED TO, READ BACK BY AND VERIFIED WITH: T RUDISILL,PHARMD'@0400'$  10/04/21 [place]    Streptococcus species NOT DETECTED NOT DETECTED Final   Streptococcus agalactiae NOT DETECTED NOT DETECTED Final   Streptococcus pneumoniae NOT DETECTED NOT DETECTED Final   Streptococcus pyogenes NOT DETECTED NOT DETECTED Final    A.calcoaceticus-baumannii NOT DETECTED NOT DETECTED Final   Bacteroides fragilis NOT DETECTED NOT DETECTED Final   Enterobacterales NOT DETECTED NOT DETECTED Final   Enterobacter cloacae complex NOT DETECTED NOT DETECTED Final   Escherichia coli NOT DETECTED NOT DETECTED Final   Klebsiella aerogenes NOT DETECTED NOT DETECTED Final   Klebsiella oxytoca NOT DETECTED NOT DETECTED Final   Klebsiella pneumoniae NOT DETECTED NOT DETECTED Final   Proteus species NOT DETECTED NOT DETECTED Final   Salmonella species NOT DETECTED NOT DETECTED Final   Serratia marcescens NOT DETECTED NOT DETECTED Final   Haemophilus influenzae NOT DETECTED NOT DETECTED Final   Neisseria meningitidis NOT DETECTED NOT DETECTED Final   Pseudomonas aeruginosa NOT DETECTED NOT DETECTED Final   Stenotrophomonas maltophilia NOT DETECTED NOT DETECTED Final   Candida albicans NOT DETECTED NOT DETECTED Final   Candida auris NOT DETECTED NOT DETECTED Final   Candida glabrata NOT DETECTED NOT DETECTED Final   Candida krusei NOT DETECTED NOT DETECTED Final   Candida parapsilosis NOT DETECTED NOT DETECTED Final   Candida tropicalis NOT DETECTED NOT DETECTED Final   Cryptococcus neoformans/gattii NOT DETECTED  NOT DETECTED Final   Methicillin resistance mecA/C NOT DETECTED NOT DETECTED Final    Comment: Performed at [facility], [place] [address], [place], [place] [address]  Culture, blood (Routine X 2) w Reflex to ID Panel     Status: None (Preliminary result)   Collection Time: 10/03/21 10:18 AM   Specimen: BLOOD LEFT HAND  Result Value Ref Range Status   Specimen Description BLOOD LEFT HAND  Final   Special Requests   Final    BOTTLES DRAWN AEROBIC ONLY Blood Culture adequate volume   Culture   Final    NO GROWTH 3 DAYS Performed at [facility], [place] [address], [place], [place] [address]    Report Status PENDING  Incomplete  Culture, blood (Routine X 2) w Reflex to ID Panel     Status: None (Preliminary  result)   Collection Time: 10/03/21 10:38 AM   Specimen: BLOOD LEFT ARM  Result Value Ref Range Status   Specimen Description BLOOD LEFT ARM  Final   Special Requests   Final    BOTTLES DRAWN AEROBIC ONLY Blood Culture results may not be optimal due to an inadequate volume of blood received in culture bottles   Culture   Final    NO GROWTH 3 DAYS Performed at [facility], [place] [address] [place], [place] [address]    Report Status PENDING  Incomplete  MRSA Next Gen by PCR, Nasal     Status: None   Collection Time: 10/03/21 10:51 AM   Specimen: Urine, Catheterized; Nasal Swab  Result Value Ref Range Status   MRSA by PCR Next Gen NOT DETECTED NOT DETECTED Final    Comment: (NOTE) The GeneXpert MRSA Assay (FDA approved for NASAL specimens only), is one component of a comprehensive MRSA colonization surveillance program. It is not intended to diagnose MRSA infection nor to guide or monitor treatment for MRSA infections. Test performance is not FDA approved in patients less than 76 years old. Performed at [facility], [place] [address], [place], [place] [address]   Culture, blood (Routine X 2) w Reflex to ID Panel     Status: None (Preliminary result)   Collection Time: 10/05/21  3:12 AM   Specimen: BLOOD  Result Value Ref Range Status   Specimen Description BLOOD LEFT ANTECUBITAL  Final   Special Requests   Final    BOTTLES DRAWN AEROBIC AND ANAEROBIC Blood Culture adequate volume   Culture   Final    NO GROWTH 1 DAY Performed at [facility], [place] [address], [place], [place] [address]    Report Status PENDING  Incomplete  Culture, blood (Routine X 2) w Reflex to ID Panel     Status: None (Preliminary result)   Collection Time: 10/05/21  3:14 AM   Specimen: BLOOD RIGHT HAND  Result Value Ref Range Status   Specimen Description BLOOD RIGHT HAND  Final   Special Requests   Final    BOTTLES DRAWN AEROBIC AND ANAEROBIC Blood Culture adequate volume   Culture    Final    NO GROWTH 1 DAY Performed at [facility], [place] [address], [place], [place] [address]    Report Status PENDING  Incomplete     Radiology Studies: DG Swallowing Func-Speech Pathology  Result Date: 10/05/2021 Table formatting from the original result was not included. Objective Swallowing Evaluation: Type of Study: MBS-Modified Barium Swallow Study  Patient Details Name: BRIGET SHAHEED MRN: [id number]  Date of Birth: 10-17-1945 Today's Date: 10/05/2021 Time: SLP Start Time (ACUTE ONLY): 1350 -SLP Stop Time (ACUTE ONLY): 1410 SLP Time Calculation (min) (ACUTE ONLY): 20 min Past Medical History: Past Medical History: Diagnosis Date  Anxiety   Arthritis   "knees are bone on bone and i see dr. Tonita Cong for the cortisone injections "   Cellulitis   hx  of in the legs   Chronic kidney disease (CKD), stage III (moderate) (HCC)   Complication of anesthesia   doesnt know the name of the anesthesia , but i was vomiting while i was still under   Diabetes mellitus without complication (HCC)   type 2   GERD (gastroesophageal reflux disease)   Glaucoma   Gout   History of blood transfusion   as child after accident, after childbirth/hemorroid surgery  History of kidney stones   History of radiation therapy   vaginal brachytherapy  11/27/2017-12/25/2017   Dr Gery Pray  Hyperlipidemia   Hypertension   Leukocytes in urine   Lumbar back pain   Sleep apnea   non adherant with cpap device  Past Surgical History: Past Surgical History: Procedure Laterality Date  APPENDECTOMY  1972  CATARACT EXTRACTION W/PHACO Right 01/10/2014  Procedure: CATARACT EXTRACTION PHACO AND INTRAOCULAR LENS PLACEMENT ([place]);  Surgeon: Tonny Branch, MD;  Location: [facility];  Service: Ophthalmology;  Laterality: Right;  CDE:5.78  CATARACT EXTRACTION W/PHACO Left 01/27/2014  Procedure: CATARACT EXTRACTION PHACO AND INTRAOCULAR LENS PLACEMENT LEFT EYE CDE=5.75;  Surgeon: Tonny Branch, MD;  Location: [facility];  Service: Ophthalmology;  Laterality: Left;   CHOLECYSTECTOMY  1972  COLONOSCOPY WITH PROPOFOL N/A 09/01/2012  Procedure: COLONOSCOPY WITH PROPOFOL;  Surgeon: Garlan Fair, MD;  Location: [facility];  Service: Endoscopy;  Laterality: N/A;  KIDNEY STONE SURGERY    OMENTECTOMY  09/30/2017  Procedure: OMENTECTOMY;  Surgeon: [name], MD;  Location: [facility];  Service: Gynecology;;  ROBOTIC ASSISTED TOTAL HYSTERECTOMY WITH BILATERAL SALPINGO OOPHERECTOMY Bilateral 09/30/2017  Procedure: ROBOTIC ASSISTED TOTAL HYSTERECTOMY WITH BILATERAL SALPINGO OOPHORECTOMY;  Surgeon: [name], MD;  Location: [facility];  Service: Gynecology;  Laterality: Bilateral; HPI: Patient is a 76 y.o. female with PMH: DM, GERD anxiety, gout, glaucoma, HTN, OSA nonadherent with CPAP, CKD III. She presented to the hospital via EMS on 10/03/21 from home after husband heard her fall in middle of the night and she was acting as if "drunk" but no ETOH suspected. Husband had reported that she has gotten a couple of her meds mixed up in the past and taken too much by accident. In ER, GCS was 8 and she had respiratory distress. She was intubated for airway protection on 7/5. UDS was negative, CT head negative for acute intracranial process, CXR showed right greater than left interstitial edema, effusions. She was extubated 10/04/21 at 0929 to 2L [place]. RN concerned about patient's persistent coughing with PO's, prompting swallow eval order with SLP.  Subjective: pleasant, some confusion, spouse and later daughter in room  Recommendations for follow up therapy are one component of a multi-disciplinary discharge planning process, led by the attending physician.  Recommendations may be updated based on patient status, additional functional criteria and insurance authorization. Assessment / Plan / Recommendation   10/05/2021   2:47 PM Clinical Impressions Clinical Impression Despite SLP's prompts and frequent repositioning, the view of pt's larynx was intermittently obscured by pt's shoulders. Pt presents with a  pharyngeal delay which resulted in penetration (PAS 5) and aspiration (PAS 7) of thin liquids when consecutive swallows  were used or large boluses consumed. The effectiveness of pt's cough could not be conclusively determined due to the suboptimal view of the larynx. Some functional benefit was noted with a chin tuck posture, but the consistent effectiveness of this was unclear since pt's shoulders frequently became elevated when implementing this posture. Reduced bolus sizes eliminated or improved laryngeal invasion to PAS 3. However, depth increased to PAS 5 once and subsequent aspiration (PAS 7) is suspected due to pt's coughing thereafter. Pt's diet will be advanced to regular texture solids and nectar thick liquids will be continued. However, thin water will be allowed between meals after oral care with observance of precautions (i.e, no straws & avoidance of consecutive swallows). Pt's prognosis for advancement to thin liquids is judged to be very good with training for consistent use of swallowing precautions. SLP Visit Diagnosis Dysphagia, pharyngeal phase (R13.13) Impact on safety and function Mild aspiration risk     10/05/2021   2:47 PM Treatment Recommendations Treatment Recommendations Therapy as outlined in treatment plan below     10/05/2021   2:47 PM Prognosis Prognosis for Safe Diet Advancement Good   10/05/2021   2:47 PM Diet Recommendations SLP Diet Recommendations Regular solids;Nectar thick liquid Liquid Administration via Cup;Straw Medication Administration Whole meds with puree Compensations Slow rate;Small sips/bites Postural Changes Seated upright at 90 degrees     10/05/2021   2:47 PM Other Recommendations Follow Up Recommendations Skilled nursing-short term rehab (<3 hours/day) Functional Status Assessment Patient has had a recent decline in their functional status and demonstrates the ability to make significant improvements in function in a reasonable and predictable amount of time.   10/05/2021    2:47 PM Frequency and Duration  Speech Therapy Frequency (ACUTE ONLY) min 2x/week Treatment Duration 2 weeks     10/05/2021   2:47 PM Oral Phase Oral Phase [facility]    10/05/2021   2:47 PM Pharyngeal Phase Pharyngeal Phase Impaired Pharyngeal- Nectar Straw Delayed swallow initiation-pyriform sinuses Pharyngeal- Thin Cup Delayed swallow initiation-pyriform sinuses;Penetration/Aspiration during swallow Pharyngeal Material enters airway, remains ABOVE vocal cords and not ejected out;Material enters airway, CONTACTS cords and not ejected out Pharyngeal- Thin Straw Delayed swallow initiation-pyriform sinuses;Penetration/Aspiration before swallow;Penetration/Aspiration during swallow Pharyngeal Material enters airway, remains ABOVE vocal cords and not ejected out;Material enters airway, CONTACTS cords and not ejected out;Material enters airway, passes BELOW cords and not ejected out despite cough attempt by patient Pharyngeal- Puree Delayed swallow initiation-vallecula Pharyngeal- Regular WFL Pharyngeal- Pill Delayed swallow initiation-pyriform sinuses    10/05/2021   2:47 PM Cervical Esophageal Phase  Cervical Esophageal Phase [facility] Shanika I. Hardin Negus, [place], [place] Office number [phone] Pager [phone] Horton Marshall 10/05/2021, 3:36 PM                      Scheduled Meds:  [START ON 10/07/2021] aspirin EC  81 mg Oral Daily   famotidine  20 mg Oral BID   feeding supplement  237 mL Oral BID BM   [START ON 10/07/2021] fenofibrate  160 mg Oral q morning   furosemide  20 mg Oral BID   heparin  5,000 Units Subcutaneous Q8H   insulin aspart  0-15 Units Subcutaneous TID WC   insulin aspart  0-5 Units Subcutaneous QHS   lipase/protease/amylase  72,000 Units Oral BID WC   metoprolol succinate  25 mg Oral Daily   mouth rinse  15 mL Mouth Rinse 4 times per day   pantoprazole  40 mg Oral QHS  simvastatin  20 mg Oral QPM   Continuous Infusions:  sodium chloride     sodium chloride Stopped  (10/03/21 1557)    ceFAZolin (ANCEF) IV 2 g (10/06/21 1000)     LOS: 3 days   Darliss Cheney, MD [facility]  10/06/2021, 2:00 PM   *Please note that this is a verbal dictation therefore any spelling or grammatical errors are due to the "[place] [employer]" system interpretation.  Please page via [place] and do not message via secure chat for urgent patient care matters. Secure chat can be used for non urgent patient care matters.  How to contact the [facility] Attending or Consulting provider [place] or covering provider during after [place], for this patient?  Check the care team in [facility] and look for a) attending/consulting [facility] provider listed and b) the [facility] team listed. Page or secure chat [id number]. Log into [email or web address] and use [employer] universal password to access. If you do not have the password, please contact the hospital operator. Locate the [facility] provider you are looking for under [facility] and page to a number that you can be directly reached. If you still have difficulty reaching the provider, please page the [facility] (Director on Call) for the Hospitalists listed on amion for assistance.

## 2021-10-07 DIAGNOSIS — G9341 Metabolic encephalopathy: Secondary | ICD-10-CM | POA: Diagnosis not present

## 2021-10-07 LAB — GLUCOSE, CAPILLARY
Glucose-Capillary: 133 mg/dL — ABNORMAL HIGH (ref 70–99)
Glucose-Capillary: 121 mg/dL — ABNORMAL HIGH (ref 70–99)
Glucose-Capillary: 134 mg/dL — ABNORMAL HIGH (ref 70–99)
Glucose-Capillary: 125 mg/dL — ABNORMAL HIGH (ref 70–99)
Glucose-Capillary: 161 mg/dL — ABNORMAL HIGH (ref 70–99)

## 2021-10-07 LAB — BASIC METABOLIC PANEL
Anion gap: 10 (ref 5–15)
BUN: 36 mg/dL — ABNORMAL HIGH (ref 8–23)
CO2: 22 mmol/L (ref 22–32)
Calcium: 7.9 mg/dL — ABNORMAL LOW (ref 8.9–10.3)
Chloride: 110 mmol/L (ref 98–111)
Creatinine, Ser: 1.86 mg/dL — ABNORMAL HIGH (ref 0.44–1.00)
GFR, Estimated: 28 mL/min — ABNORMAL LOW (ref >60)
Glucose, Bld: 161 mg/dL — ABNORMAL HIGH (ref 70–99)
Potassium: 4.6 mmol/L (ref 3.5–5.1)
Sodium: 142 mmol/L (ref 135–145)

## 2021-10-07 NOTE — Progress Notes (Signed)
PROGRESS NOTE    Elizabeth Crosby  IDP:[id number] DOB: 1945-07-17 DOA: 10/03/2021 PCP: Elizabeth Neer, MD   Brief Narrative:  76 y/o F who presented to [facility] on 7/5 with reports of altered mental status and respiratory distress.    The patient presented to [facility] via EMS.  Her husband reports she got up in the middle of the night to go to the restroom and he heard her fall.  He reported she acted as if she was "drunk" but no ETOH suspected. He found her on her knees. She takes tramadol and tizanidine and has gotten her meds mixed up in the past and taken too much on accident.  On presentation to the ER, her GCS was 8 and she had respiratory distress. She was intubated for airway protection - reported to have desaturated with RSI, 2 attempts. UDS negative. CT Head negative for acute process. BNP pending.  Admitted under PCCM, extubated 10/05/2021 and transferred to [facility] on 10/06/2021.   PCCM called for ICU admission.    Daughter, Elizabeth Crosby [phone]), reports she occasionally will get her meds mixed up and she notes the patient to have slurring of her words.  The patient typically avoids going to the MD if possible.  She was recently seen by her PCP for routine check up and had a good report.  She notes that her "pancreas doesn't work well and she was recently started on meds to help".      Assessment & Plan:   Principal Problem:   Acute metabolic encephalopathy Active Problems:   Morbid obesity ([place])   DM2 (diabetes mellitus, type 2) ([place])   Delirium   Respiratory distress   Bacteremia   Acute renal failure superimposed on stage 3a chronic kidney disease ([place])   Accident due to mechanical fall without injury  Acute Metabolic Encephalopathy: In setting of suspected accidental medication mix up, possible aspiration PNA. CT head/neck negative for acute process.  -initially intubated 7/5, extubated 7/6 - GCS 8 on presentation. Hx of similar admission in August.  UDS, ETOH  negative. -worked with PT, they recommend SNF, TOC on board.  Pending placement.   Acute Hypoxic Respiratory Failure in setting of Bilateral Infiltrates vs Effusions R>L OSA/aspiration pneumonia:  Staph bacteremia, Blood culture x1 positive for staph lugdensis, De-escalated abx to Cefazolin, echo reassuring, no artificial heart valves or implanted cardiac devices to increase risk of endocarditis, repeat blood culture negative.  AKI on CKD IIIa Baseline sr cr ~ 1.1-1.2, presented with creatinine of 1.67 which has remained stable.  Wonder if this is going to be her new baseline.  Continue to avoid nephrotoxic agents.   Sedation Related Hypotension  Noted in ER on propofol. Improved with cessation of propofol.  -resolved off sedation   Mechanical Fall  CT head, neck negative for acute process  -in the setting of encephalopathy -PT eval recommended SNF placement.  History of grade 1 diastolic dysfunction/bilateral lower extremity edema: We will resume Lasix 20 mg p.o. daily that she was taking at home.  Type 2 diabetes mellitus: Blood sugar controlled on SSI.  Hypertension: Controlled.  Continue Toprol-XL.  Continue to hold telmisartan-HCTZ in the setting of AKI.  Morbid obesity: Weight loss counseled.  DVT prophylaxis: heparin injection 5,000 Units Start: 10/03/21 0815 SCDs Start: 10/03/21 0808   Code Status: Full Code  Family Communication: None at bedside.  Status is: Inpatient Remains inpatient appropriate because: Pending SNF placement.  Medically stable.   Estimated body mass index is 41.2 kg/m  as calculated from the following:   Height as of this encounter: '5\' 1"'$  (1.549 m).   Weight as of this encounter: 98.9 kg.    Nutritional Assessment: Body mass index is 41.2 kg/m.Marland Kitchen Seen by dietician.  I agree with the assessment and plan as outlined below: Nutrition Status: Nutrition Problem: Inadequate oral intake Etiology: dysphagia Signs/Symptoms: meal completion <  50% Interventions: Ensure Enlive (each supplement provides 350kcal and 20 grams of protein)  . Skin Assessment: I have examined the patient's skin and I agree with the wound assessment as performed by the wound care RN as outlined below:    Consultants:  None  Procedures:  None  Antimicrobials:  Anti-infectives (From admission, onward)    Start     Dose/Rate Route Frequency Ordered Stop   10/05/21 0800  ceFAZolin (ANCEF) IVPB 2g/100 mL premix        2 g 200 mL/hr over 30 Minutes Intravenous Every 8 hours 10/04/21 0953     10/04/21 0515  vancomycin (VANCOREADY) IVPB 1500 mg/300 mL        1,500 mg 150 mL/hr over 120 Minutes Intravenous  Once 10/04/21 0423 10/04/21 0658   10/03/21 0830  cefTRIAXone (ROCEPHIN) 2 g in sodium chloride 0.9 % 100 mL IVPB  Status:  Discontinued        2 g 200 mL/hr over 30 Minutes Intravenous Every 24 hours 10/03/21 0815 10/04/21 0953   10/03/21 0830  azithromycin (ZITHROMAX) 500 mg in sodium chloride 0.9 % 250 mL IVPB  Status:  Discontinued        500 mg 250 mL/hr over 60 Minutes Intravenous Every 24 hours 10/03/21 0815 10/04/21 0953         Subjective: Patient seen and examined.  She complains of bilateral knee pain, right more than the left but she tells me that this is going on since she failed.  She has already been assessed by PT OT.  No concern for fracture.  Objective: Vitals:   10/07/21 0034 10/07/21 0452 10/07/21 0748 10/07/21 0752  BP: (!) 117/41 (!) 119/45 (!) 133/47   Pulse: 73 71 73   Resp: '18 18 14   '$ Temp: 98.7 F (37.1 C) 99.3 F (37.4 C) 98.4 F (36.9 C)   TempSrc: Oral Oral Oral Oral  SpO2: 97% 96% 93%   Weight:      Height:        Intake/Output Summary (Last 24 hours) at 10/07/2021 1138 Last data filed at 10/07/2021 0324 Gross per 24 hour  Intake 599.92 ml  Output 325 ml  Net 274.92 ml   Filed Weights   10/03/21 0517  Weight: 98.9 kg    Examination:  General exam: Appears calm and comfortable, morbidly  obese Respiratory system: Diminished breath sounds at the bases bilaterally. Respiratory effort normal. Cardiovascular system: S1 & S2 heard, RRR. No JVD, murmurs, rubs, gallops or clicks. No pedal edema. Gastrointestinal system: Abdomen is nondistended, soft and nontender. No organomegaly or masses felt. Normal bowel sounds heard. Central nervous system: Alert and oriented. No focal neurological deficits. Extremities: Symmetric 5 x 5 power.  Skin abrasions on bilateral knees. Skin: No rashes, lesions or ulcers.  Psychiatry: Judgement and insight appear normal. Mood & affect appropriate.   Data Reviewed: I have personally reviewed following labs and imaging studies  CBC: Recent Labs  Lab 10/03/21 0531 10/03/21 0611 10/03/21 0903 10/04/21 0416 10/05/21 0312 10/06/21 0149  WBC 6.8  --   --  13.0* 9.6 6.9  NEUTROABS 4.7  --   --   --   --   --  HGB 10.9* 10.2*  9.9* 8.8* 9.8* 8.9* 9.1*  HCT 32.0* 30.0*  29.0* 26.0* 30.3* 28.3* 27.3*  MCV 95.2  --   --  98.4 98.3 95.5  PLT 159  --   --  153 123* 116*    Basic Metabolic Panel: Recent Labs  Lab 10/03/21 0531 10/03/21 0611 10/03/21 0903 10/03/21 1018 10/03/21 1459 10/04/21 0416 10/04/21 1532 10/05/21 0312 10/06/21 0149 10/07/21 0124  NA 149* 149*  150* 148*  --   --  145  --  143 143 142  K 3.9 3.9  3.9 3.6  --   --  4.5  --  3.9 4.1 4.6  CL 117* 116*  --   --   --  117*  --  111 115* 110  CO2 20*  --   --   --   --  21*  --  '22 24 22  '$ GLUCOSE 151* 145*  --   --   --  210*  --  69* 118* 161*  BUN 23 20  --   --   --  24*  --  27* 30* 36*  CREATININE 1.67* 1.60*  --   --   --  1.65*  --  1.78* 1.70* 1.86*  CALCIUM 8.4*  --   --   --   --  7.9*  --  8.0* 8.0* 7.9*  MG  --   --   --  1.5* 1.6* 2.1 2.0  --   --   --   PHOS  --   --   --  2.7 2.7 3.0 3.1  --   --   --     GFR: Estimated Creatinine Clearance: 28.1 mL/min (A) (by C-G formula based on SCr of 1.86 mg/dL (H)). Liver Function Tests: Recent Labs  Lab  10/03/21 0531  AST 40  ALT 20  ALKPHOS 70  BILITOT 1.7*  PROT 5.3*  ALBUMIN 2.7*    No results for input(s): "LIPASE", "AMYLASE" in the last 168 hours. No results for input(s): "AMMONIA" in the last 168 hours. Coagulation Profile: No results for input(s): "INR", "PROTIME" in the last 168 hours. Cardiac Enzymes: No results for input(s): "CKTOTAL", "CKMB", "CKMBINDEX", "TROPONINI" in the last 168 hours. BNP (last 3 results) No results for input(s): "PROBNP" in the last 8760 hours. HbA1C: No results for input(s): "HGBA1C" in the last 72 hours. CBG: Recent Labs  Lab 10/06/21 0808 10/06/21 1620 10/06/21 2139 10/07/21 0638 10/07/21 0751  GLUCAP 126* 174* 229* 133* 121*    Lipid Profile: No results for input(s): "CHOL", "HDL", "LDLCALC", "TRIG", "CHOLHDL", "LDLDIRECT" in the last 72 hours. Thyroid Function Tests: No results for input(s): "TSH", "T4TOTAL", "FREET4", "T3FREE", "THYROIDAB" in the last 72 hours. Anemia Panel: No results for input(s): "VITAMINB12", "FOLATE", "FERRITIN", "TIBC", "IRON", "RETICCTPCT" in the last 72 hours. Sepsis Labs: Recent Labs  Lab 10/03/21 0537 10/03/21 1018 10/03/21 1459 10/04/21 0416 10/05/21 0312  PROCALCITON  --  0.10  --  0.27 0.44  LATICACIDVEN 2.2* 1.5 1.2  --   --      Recent Results (from the past 240 hour(s))  Blood Culture (Routine X 2)     Status: Abnormal   Collection Time: 10/03/21  5:33 AM   Specimen: BLOOD  Result Value Ref Range Status   Specimen Description BLOOD SITE NOT SPECIFIED  Final   Special Requests   Final    BOTTLES DRAWN AEROBIC AND ANAEROBIC Blood Culture adequate volume   Culture  Setup Time  Final    GRAM POSITIVE COCCI AEROBIC BOTTLE ONLY CRITICAL RESULT CALLED TO, READ BACK BY AND VERIFIED WITH: T RUDISILL,PHARMD'@0359'$  10/04/21 mk Performed at [facility], [address] [place], [place] [address]    Culture STAPHYLOCOCCUS LUGDUNENSIS (A)  Final   Report Status 10/06/2021 FINAL  Final    Organism ID, Bacteria STAPHYLOCOCCUS LUGDUNENSIS  Final      Susceptibility   Staphylococcus lugdunensis - MIC*    CIPROFLOXACIN 4 RESISTANT Resistant     ERYTHROMYCIN >=8 RESISTANT Resistant     GENTAMICIN <=0.5 SENSITIVE Sensitive     OXACILLIN <=0.25 SENSITIVE Sensitive     TETRACYCLINE >=16 RESISTANT Resistant     VANCOMYCIN 1 SENSITIVE Sensitive     TRIMETH/SULFA <=10 SENSITIVE Sensitive     CLINDAMYCIN RESISTANT Resistant     RIFAMPIN <=0.5 SENSITIVE Sensitive     Inducible Clindamycin POSITIVE Resistant     * STAPHYLOCOCCUS LUGDUNENSIS  Blood Culture (Routine X 2)     Status: None (Preliminary result)   Collection Time: 10/03/21  5:33 AM   Specimen: BLOOD  Result Value Ref Range Status   Specimen Description BLOOD SITE NOT SPECIFIED  Final   Special Requests   Final    BOTTLES DRAWN AEROBIC AND ANAEROBIC Blood Culture results may not be optimal due to an excessive volume of blood received in culture bottles   Culture   Final    NO GROWTH 4 DAYS Performed at [facility], [place] [address], [place],  [address]    Report Status PENDING  Incomplete  Blood Culture ID Panel (Reflexed)     Status: Abnormal   Collection Time: 10/03/21  5:33 AM  Result Value Ref Range Status   Enterococcus faecalis NOT DETECTED NOT DETECTED Final   Enterococcus Faecium NOT DETECTED NOT DETECTED Final   Listeria monocytogenes NOT DETECTED NOT DETECTED Final   Staphylococcus species DETECTED (A) NOT DETECTED Final    Comment: CRITICAL RESULT CALLED TO, READ BACK BY AND VERIFIED WITH: T RUDISILL,PHARMD'@0400'$  10/04/21 [place]    Staphylococcus aureus (BCID) NOT DETECTED NOT DETECTED Final   Staphylococcus epidermidis NOT DETECTED NOT DETECTED Final   Staphylococcus lugdunensis DETECTED (A) NOT DETECTED Final    Comment: CRITICAL RESULT CALLED TO, READ BACK BY AND VERIFIED WITH: T RUDISILL,PHARMD'@0400'$  10/04/21 [place]    Streptococcus species NOT DETECTED NOT DETECTED Final   Streptococcus  agalactiae NOT DETECTED NOT DETECTED Final   Streptococcus pneumoniae NOT DETECTED NOT DETECTED Final   Streptococcus pyogenes NOT DETECTED NOT DETECTED Final   A.calcoaceticus-baumannii NOT DETECTED NOT DETECTED Final   Bacteroides fragilis NOT DETECTED NOT DETECTED Final   Enterobacterales NOT DETECTED NOT DETECTED Final   Enterobacter cloacae complex NOT DETECTED NOT DETECTED Final   Escherichia coli NOT DETECTED NOT DETECTED Final   Klebsiella aerogenes NOT DETECTED NOT DETECTED Final   Klebsiella oxytoca NOT DETECTED NOT DETECTED Final   Klebsiella pneumoniae NOT DETECTED NOT DETECTED Final   Proteus species NOT DETECTED NOT DETECTED Final   Salmonella species NOT DETECTED NOT DETECTED Final   Serratia marcescens NOT DETECTED NOT DETECTED Final   Haemophilus influenzae NOT DETECTED NOT DETECTED Final   Neisseria meningitidis NOT DETECTED NOT DETECTED Final   Pseudomonas aeruginosa NOT DETECTED NOT DETECTED Final   Stenotrophomonas maltophilia NOT DETECTED NOT DETECTED Final   Candida albicans NOT DETECTED NOT DETECTED Final   Candida auris NOT DETECTED NOT DETECTED Final   Candida glabrata NOT DETECTED NOT DETECTED Final   Candida krusei  NOT DETECTED NOT DETECTED Final   Candida parapsilosis NOT DETECTED NOT DETECTED Final   Candida tropicalis NOT DETECTED NOT DETECTED Final   Cryptococcus neoformans/gattii NOT DETECTED NOT DETECTED Final   Methicillin resistance mecA/C NOT DETECTED NOT DETECTED Final    Comment: Performed at [facility], [place] [address] [place], [place] [address]  Culture, blood (Routine X 2) w Reflex to ID Panel     Status: None (Preliminary result)   Collection Time: 10/03/21 10:18 AM   Specimen: BLOOD LEFT HAND  Result Value Ref Range Status   Specimen Description BLOOD LEFT HAND  Final   Special Requests   Final    BOTTLES DRAWN AEROBIC ONLY Blood Culture adequate volume   Culture   Final    NO GROWTH 4 DAYS Performed at [facility],  [place] [address] [place], [place] [address]    Report Status PENDING  Incomplete  Culture, blood (Routine X 2) w Reflex to ID Panel     Status: None (Preliminary result)   Collection Time: 10/03/21 10:38 AM   Specimen: BLOOD LEFT ARM  Result Value Ref Range Status   Specimen Description BLOOD LEFT ARM  Final   Special Requests   Final    BOTTLES DRAWN AEROBIC ONLY Blood Culture results may not be optimal due to an inadequate volume of blood received in culture bottles   Culture   Final    NO GROWTH 4 DAYS Performed at [facility], [place] [address] [place], [place] [address]    Report Status PENDING  Incomplete  MRSA Next Gen by PCR, Nasal     Status: None   Collection Time: 10/03/21 10:51 AM   Specimen: Urine, Catheterized; Nasal Swab  Result Value Ref Range Status   MRSA by PCR Next Gen NOT DETECTED NOT DETECTED Final    Comment: (NOTE) The GeneXpert MRSA Assay (FDA approved for NASAL specimens only), is one component of a comprehensive MRSA colonization surveillance program. It is not intended to diagnose MRSA infection nor to guide or monitor treatment for MRSA infections. Test performance is not FDA approved in patients less than 66 years old. Performed at [facility], [place] [address] [place], [place] [address]   Culture, blood (Routine X 2) w Reflex to ID Panel     Status: None (Preliminary result)   Collection Time: 10/05/21  3:12 AM   Specimen: BLOOD  Result Value Ref Range Status   Specimen Description BLOOD LEFT ANTECUBITAL  Final   Special Requests   Final    BOTTLES DRAWN AEROBIC AND ANAEROBIC Blood Culture adequate volume   Culture   Final    NO GROWTH 2 DAYS Performed at [facility], [place] [address] [place], [place] [address]    Report Status PENDING  Incomplete  Culture, blood (Routine X 2) w Reflex to ID Panel     Status: None (Preliminary result)   Collection Time: 10/05/21  3:14 AM   Specimen: BLOOD RIGHT HAND  Result Value Ref Range Status    Specimen Description BLOOD RIGHT HAND  Final   Special Requests   Final    BOTTLES DRAWN AEROBIC AND ANAEROBIC Blood Culture adequate volume   Culture   Final    NO GROWTH 2 DAYS Performed at [facility], [place] [address] [place],  [address]    Report Status PENDING  Incomplete     Radiology Studies: DG Swallowing Func-Speech Pathology  Result Date:  10/05/2021 Table formatting from the original result was not included. Objective Swallowing Evaluation: Type of Study: MBS-Modified Barium Swallow Study  Patient Details Name: Elizabeth Crosby MRN: [id number] Date of Birth: 1946-01-01 Today's Date: 10/05/2021 Time: SLP Start Time (ACUTE ONLY): 1350 -SLP Stop Time (ACUTE ONLY): 1410 SLP Time Calculation (min) (ACUTE ONLY): 20 min Past Medical History: Past Medical History: Diagnosis Date  Anxiety   Arthritis   "knees are bone on bone and i see dr. Tonita Cong for the cortisone injections "   Cellulitis   hx  of in the legs   Chronic kidney disease (CKD), stage III (moderate) ([place])   Complication of anesthesia   doesnt know the name of the anesthesia , but i was vomiting while i was still under   Diabetes mellitus without complication (HCC)   type 2   GERD (gastroesophageal reflux disease)   Glaucoma   Gout   History of blood transfusion   as child after accident, after childbirth/hemorroid surgery  History of kidney stones   History of radiation therapy   vaginal brachytherapy  11/27/2017-12/25/2017   Dr Gery Pray  Hyperlipidemia   Hypertension   Leukocytes in urine   Lumbar back pain   Sleep apnea   non adherant with cpap device  Past Surgical History: Past Surgical History: Procedure Laterality Date  APPENDECTOMY  1972  CATARACT EXTRACTION W/PHACO Right 01/10/2014  Procedure: CATARACT EXTRACTION PHACO AND INTRAOCULAR LENS PLACEMENT ([place]);  Surgeon: Tonny Branch, MD;  Location: [facility];  Service: Ophthalmology;  Laterality: Right;  CDE:5.78  CATARACT EXTRACTION W/PHACO Left 01/27/2014  Procedure: CATARACT  EXTRACTION PHACO AND INTRAOCULAR LENS PLACEMENT LEFT EYE CDE=5.75;  Surgeon: Tonny Branch, MD;  Location: [facility];  Service: Ophthalmology;  Laterality: Left;  CHOLECYSTECTOMY  1972  COLONOSCOPY WITH PROPOFOL N/A 09/01/2012  Procedure: COLONOSCOPY WITH PROPOFOL;  Surgeon: Garlan Fair, MD;  Location: [facility];  Service: Endoscopy;  Laterality: N/A;  KIDNEY STONE SURGERY    OMENTECTOMY  09/30/2017  Procedure: OMENTECTOMY;  Surgeon: [name], MD;  Location: [facility];  Service: Gynecology;;  ROBOTIC ASSISTED TOTAL HYSTERECTOMY WITH BILATERAL SALPINGO OOPHERECTOMY Bilateral 09/30/2017  Procedure: ROBOTIC ASSISTED TOTAL HYSTERECTOMY WITH BILATERAL SALPINGO OOPHORECTOMY;  Surgeon: [name], MD;  Location: [facility];  Service: Gynecology;  Laterality: Bilateral; HPI: Patient is a 76 y.o. female with PMH: DM, GERD anxiety, gout, glaucoma, HTN, OSA nonadherent with CPAP, CKD III. She presented to the hospital via EMS on 10/03/21 from home after husband heard her fall in middle of the night and she was acting as if "drunk" but no ETOH suspected. Husband had reported that she has gotten a couple of her meds mixed up in the past and taken too much by accident. In ER, GCS was 8 and she had respiratory distress. She was intubated for airway protection on 7/5. UDS was negative, CT head negative for acute intracranial process, CXR showed right greater than left interstitial edema, effusions. She was extubated 10/04/21 at 0929 to 2L [place]. RN concerned about patient's persistent coughing with PO's, prompting swallow eval order with SLP.  Subjective: pleasant, some confusion, spouse and later daughter in room  Recommendations for follow up therapy are one component of a multi-disciplinary discharge planning process, led by the attending physician.  Recommendations may be updated based on patient status, additional functional criteria and insurance authorization. Assessment / Plan / Recommendation   10/05/2021   2:47 PM Clinical Impressions  Clinical Impression Despite SLP's prompts and frequent repositioning, the view of pt's  larynx was intermittently obscured by pt's shoulders. Pt presents with a pharyngeal delay which resulted in penetration (PAS 5) and aspiration (PAS 7) of thin liquids when consecutive swallows were used or large boluses consumed. The effectiveness of pt's cough could not be conclusively determined due to the suboptimal view of the larynx. Some functional benefit was noted with a chin tuck posture, but the consistent effectiveness of this was unclear since pt's shoulders frequently became elevated when implementing this posture. Reduced bolus sizes eliminated or improved laryngeal invasion to PAS 3. However, depth increased to PAS 5 once and subsequent aspiration (PAS 7) is suspected due to pt's coughing thereafter. Pt's diet will be advanced to regular texture solids and nectar thick liquids will be continued. However, thin water will be allowed between meals after oral care with observance of precautions (i.e, no straws & avoidance of consecutive swallows). Pt's prognosis for advancement to thin liquids is judged to be very good with training for consistent use of swallowing precautions. SLP Visit Diagnosis Dysphagia, pharyngeal phase (R13.13) Impact on safety and function Mild aspiration risk     10/05/2021   2:47 PM Treatment Recommendations Treatment Recommendations Therapy as outlined in treatment plan below     10/05/2021   2:47 PM Prognosis Prognosis for Safe Diet Advancement Good   10/05/2021   2:47 PM Diet Recommendations SLP Diet Recommendations Regular solids;Nectar thick liquid Liquid Administration via Cup;Straw Medication Administration Whole meds with puree Compensations Slow rate;Small sips/bites Postural Changes Seated upright at 90 degrees     10/05/2021   2:47 PM Other Recommendations Follow Up Recommendations Skilled nursing-short term rehab (<3 hours/day) Functional Status Assessment Patient has had a recent decline  in their functional status and demonstrates the ability to make significant improvements in function in a reasonable and predictable amount of time.   10/05/2021   2:47 PM Frequency and Duration  Speech Therapy Frequency (ACUTE ONLY) min 2x/week Treatment Duration 2 weeks     10/05/2021   2:47 PM Oral Phase Oral Phase [facility]    10/05/2021   2:47 PM Pharyngeal Phase Pharyngeal Phase Impaired Pharyngeal- Nectar Straw Delayed swallow initiation-pyriform sinuses Pharyngeal- Thin Cup Delayed swallow initiation-pyriform sinuses;Penetration/Aspiration during swallow Pharyngeal Material enters airway, remains ABOVE vocal cords and not ejected out;Material enters airway, CONTACTS cords and not ejected out Pharyngeal- Thin Straw Delayed swallow initiation-pyriform sinuses;Penetration/Aspiration before swallow;Penetration/Aspiration during swallow Pharyngeal Material enters airway, remains ABOVE vocal cords and not ejected out;Material enters airway, CONTACTS cords and not ejected out;Material enters airway, passes BELOW cords and not ejected out despite cough attempt by patient Pharyngeal- Puree Delayed swallow initiation-vallecula Pharyngeal- Regular WFL Pharyngeal- Pill Delayed swallow initiation-pyriform sinuses    10/05/2021   2:47 PM Cervical Esophageal Phase  Cervical Esophageal Phase [facility] [address]ika I. Hardin Negus, [place], [place] Office number [phone] Pager [phone] Horton Marshall 10/05/2021, 3:36 PM                      Scheduled Meds:  aspirin EC  81 mg Oral Daily   famotidine  20 mg Oral BID   feeding supplement  237 mL Oral BID BM   fenofibrate  160 mg Oral q morning   furosemide  20 mg Oral BID   heparin  5,000 Units Subcutaneous Q8H   insulin aspart  0-15 Units Subcutaneous TID WC   insulin aspart  0-5 Units Subcutaneous QHS   lipase/protease/amylase  72,000 Units Oral BID WC   metoprolol succinate  25 mg Oral  Daily   mouth rinse  15 mL Mouth Rinse 4 times per day   pantoprazole   40 mg Oral QHS   simvastatin  20 mg Oral QPM   Continuous Infusions:  sodium chloride     sodium chloride Stopped (10/03/21 1557)    ceFAZolin (ANCEF) IV 2 g (10/07/21 0902)     LOS: 4 days   Darliss Cheney, MD [facility]  10/07/2021, 11:38 AM   *Please note that this is a verbal dictation therefore any spelling or grammatical errors are due to the "[place] [employer]" system interpretation.  Please page via [place] and do not message via secure chat for urgent patient care matters. Secure chat can be used for non urgent patient care matters.  How to contact the [facility] Attending or Consulting provider [place] or covering provider during after hours [place], for this patient?  Check the care team in [facility] and look for a) attending/consulting TRH provider listed and b) the [facility] team listed. Page or secure chat [phone]. Log into [email or web address] and use [place]'s universal password to access. If you do not have the password, please contact the hospital operator. Locate the [facility] provider you are looking for under [facility] and page to a number that you can be directly reached. If you still have difficulty reaching the provider, please page the [facility] (Director on Call) for the Hospitalists listed on amion for assistance.

## 2021-10-07 NOTE — Plan of Care (Signed)
  Problem: Education: Goal: Ability to describe self-care measures that may prevent or decrease complications (Diabetes Survival Skills Education) will improve Outcome: Progressing Goal: Individualized Educational Video(s) Outcome: Progressing   Problem: Coping: Goal: Ability to adjust to condition or change in health will improve Outcome: Progressing   Problem: Fluid Volume: Goal: Ability to maintain a balanced intake and output will improve Outcome: Progressing   Problem: Health Behavior/Discharge Planning: Goal: Ability to identify and utilize available resources and services will improve Outcome: Progressing Goal: Ability to manage health-related needs will improve Outcome: Progressing   Problem: Metabolic: Goal: Ability to maintain appropriate glucose levels will improve Outcome: Progressing   Problem: Nutritional: Goal: Maintenance of adequate nutrition will improve Outcome: Progressing Goal: Progress toward achieving an optimal weight will improve Outcome: Progressing   Problem: Skin Integrity: Goal: Risk for impaired skin integrity will decrease Outcome: Progressing   Problem: Tissue Perfusion: Goal: Adequacy of tissue perfusion will improve Outcome: Progressing   Problem: Safety: Goal: Non-violent Restraint(s) Outcome: Progressing   Problem: Education: Goal: Knowledge of General Education information will improve Description: Including pain rating scale, medication(s)/side effects and non-pharmacologic comfort measures Outcome: Progressing   Problem: Health Behavior/Discharge Planning: Goal: Ability to manage health-related needs will improve Outcome: Progressing   Problem: Clinical Measurements: Goal: Ability to maintain clinical measurements within normal limits will improve Outcome: Progressing Goal: Will remain free from infection Outcome: Progressing Goal: Diagnostic test results will improve Outcome: Progressing Goal: Respiratory complications  will improve Outcome: Progressing Goal: Cardiovascular complication will be avoided Outcome: Progressing   Problem: Activity: Goal: Risk for activity intolerance will decrease Outcome: Progressing   Problem: Nutrition: Goal: Adequate nutrition will be maintained Outcome: Progressing   Problem: Coping: Goal: Level of anxiety will decrease Outcome: Progressing   Problem: Elimination: Goal: Will not experience complications related to bowel motility Outcome: Progressing Goal: Will not experience complications related to urinary retention Outcome: Progressing   Problem: Pain Managment: Goal: General experience of comfort will improve Outcome: Progressing   Problem: Safety: Goal: Ability to remain free from injury will improve Outcome: Progressing   Problem: Skin Integrity: Goal: Risk for impaired skin integrity will decrease Outcome: Progressing   

## 2021-10-08 DIAGNOSIS — G9341 Metabolic encephalopathy: Secondary | ICD-10-CM | POA: Diagnosis not present

## 2021-10-08 LAB — GLUCOSE, CAPILLARY
Glucose-Capillary: 120 mg/dL — ABNORMAL HIGH (ref 70–99)
Glucose-Capillary: 186 mg/dL — ABNORMAL HIGH (ref 70–99)
Glucose-Capillary: 150 mg/dL — ABNORMAL HIGH (ref 70–99)
Glucose-Capillary: 140 mg/dL — ABNORMAL HIGH (ref 70–99)

## 2021-10-08 LAB — BASIC METABOLIC PANEL
Anion gap: 9 (ref 5–15)
BUN: 39 mg/dL — ABNORMAL HIGH (ref 8–23)
CO2: 23 mmol/L (ref 22–32)
Calcium: 8 mg/dL — ABNORMAL LOW (ref 8.9–10.3)
Chloride: 107 mmol/L (ref 98–111)
Creatinine, Ser: 1.95 mg/dL — ABNORMAL HIGH (ref 0.44–1.00)
GFR, Estimated: 26 mL/min — ABNORMAL LOW (ref >60)
Glucose, Bld: 146 mg/dL — ABNORMAL HIGH (ref 70–99)
Potassium: 4.4 mmol/L (ref 3.5–5.1)
Sodium: 139 mmol/L (ref 135–145)

## 2021-10-08 LAB — CULTURE, BLOOD (ROUTINE X 2)
Culture: NO GROWTH
Culture: NO GROWTH
Culture: NO GROWTH
Special Requests: ADEQUATE

## 2021-10-08 LAB — PROCALCITONIN: Procalcitonin: 0.29 ng/mL

## 2021-10-08 MED ORDER — SODIUM CHLORIDE 0.9 % IV SOLN
INTRAVENOUS | Status: DC
Start: 2021-10-08 — End: 2021-10-09

## 2021-10-08 MED ORDER — CEFAZOLIN SODIUM-DEXTROSE 2-4 GM/100ML-% IV SOLN
2.0000 g | Freq: Two times a day (BID) | INTRAVENOUS | Status: DC
Start: 2021-10-08 — End: 2021-10-09
  Administered 2021-10-08 – 2021-10-09 (×2): 2 g via INTRAVENOUS
  Filled 2021-10-08 (×3): qty 100

## 2021-10-08 MED ORDER — FAMOTIDINE 20 MG PO TABS
20.0000 mg | ORAL_TABLET | Freq: Every day | ORAL | Status: DC
  Administered 2021-10-09: 20 mg via ORAL
  Filled 2021-10-08: qty 1

## 2021-10-08 NOTE — Progress Notes (Signed)
PROGRESS NOTE    Elizabeth Crosby  WRU:[id number] DOB: 1945-08-12 DOA: 10/03/2021 PCP: [name], MD   Brief Narrative:  76 y/o F who presented to [facility] ER on 7/5 with reports of altered mental status and respiratory distress.    The patient presented to [facility] via EMS.  Her husband reports she got up in the middle of the night to go to the restroom and he heard her fall.  He reported she acted as if she was "drunk" but no ETOH suspected. He found her on her knees. She takes tramadol and tizanidine and has gotten her meds mixed up in the past and taken too much on accident.  On presentation to the ER, her GCS was 8 and she had respiratory distress. She was intubated for airway protection - reported to have desaturated with RSI, 2 attempts. UDS negative. CT Head negative for acute process. BNP pending.  Admitted under PCCM, extubated 10/05/2021 and transferred to [facility] on 10/06/2021.   PCCM called for ICU admission.    Daughter, [name] [phone]), reports she occasionally will get her meds mixed up and she notes the patient to have slurring of her words.  The patient typically avoids going to the MD if possible.  She was recently seen by her PCP for routine check up and had a good report.  She notes that her "pancreas doesn't work well and she was recently started on meds to help".      Assessment & Plan:   Principal Problem:   Acute metabolic encephalopathy Active Problems:   Morbid obesity ([place])   DM2 (diabetes mellitus, type 2) ([place])   Delirium   Respiratory distress   Bacteremia   Acute renal failure superimposed on stage 3a chronic kidney disease ([place])   Accident due to mechanical fall without injury  Acute Metabolic Encephalopathy: In setting of suspected accidental medication mix up, possible aspiration PNA. CT head/neck negative for acute process.  -initially intubated 7/5, extubated 7/6 - GCS 8 on presentation. Hx of similar admission in August.  UDS, ETOH  negative. -worked with PT, they recommend SNF, TOC on board.  Pending placement.   Acute Hypoxic Respiratory Failure in setting of Bilateral Infiltrates vs Effusions R>L OSA/aspiration pneumonia:  Staph bacteremia, Blood culture x1 positive for staph lugdensis, De-escalated abx to Cefazolin, echo reassuring, no artificial heart valves or implanted cardiac devices to increase risk of endocarditis, repeat blood culture negative.  AKI on CKD IIIa Baseline sr cr ~ 1.1-1.2, presented with creatinine of 1.67 which is rising slowly.  Wonder if this is going to be her new baseline.  Continue to avoid nephrotoxic agents.  Will start on gentle hydration to see if that helps.   Sedation Related Hypotension  Noted in ER on propofol. Improved with cessation of propofol.  -resolved off sedation, now slightly hypertensive   Mechanical Fall  CT head, neck negative for acute process  -in the setting of encephalopathy -PT eval recommended SNF placement.  History of grade 1 diastolic dysfunction/bilateral lower extremity edema: Continue PTA dose of Lasix 20 mg p.o. daily.  Type 2 diabetes mellitus: Blood sugar controlled on SSI.  Hypertension: Controlled.  Continue Toprol-XL.  Continue to hold telmisartan-HCTZ in the setting of AKI.  Morbid obesity: Weight loss counseled.  DVT prophylaxis: heparin injection 5,000 Units Start: 10/03/21 0815 SCDs Start: 10/03/21 0808   Code Status: Full Code  Family Communication: None at bedside.  Status is: Inpatient Remains inpatient appropriate because: Pending SNF placement.  Medically stable.  Last seen by TOC on 10/05/2021.  Per TOC, patient needs to pick a place, nobody from [facility] spoke with her all weekend.   Estimated body mass index is 43.86 kg/m as calculated from the following:   Height as of this encounter: '5\' 1"'$  (1.549 m).   Weight as of this encounter: 105.3 kg.    Nutritional Assessment: Body mass index is 43.86 kg/m.Marland Kitchen Seen by dietician.  I agree  with the assessment and plan as outlined below: Nutrition Status: Nutrition Problem: Inadequate oral intake Etiology: dysphagia Signs/Symptoms: meal completion < 50% Interventions: Ensure Enlive (each supplement provides 350kcal and 20 grams of protein)  . Skin Assessment: I have examined the patient's skin and I agree with the wound assessment as performed by the wound care RN as outlined below:    Consultants:  None  Procedures:  None  Antimicrobials:  Anti-infectives (From admission, onward)    Start     Dose/Rate Route Frequency Ordered Stop   10/05/21 0800  ceFAZolin (ANCEF) IVPB 2g/100 mL premix        2 g 200 mL/hr over 30 Minutes Intravenous Every 8 hours 10/04/21 0953     10/04/21 0515  vancomycin (VANCOREADY) IVPB 1500 mg/300 mL        1,500 mg 150 mL/hr over 120 Minutes Intravenous  Once 10/04/21 0423 10/04/21 0658   10/03/21 0830  cefTRIAXone (ROCEPHIN) 2 g in sodium chloride 0.9 % 100 mL IVPB  Status:  Discontinued        2 g 200 mL/hr over 30 Minutes Intravenous Every 24 hours 10/03/21 0815 10/04/21 0953   10/03/21 0830  azithromycin (ZITHROMAX) 500 mg in sodium chloride 0.9 % 250 mL IVPB  Status:  Discontinued        500 mg 250 mL/hr over 60 Minutes Intravenous Every 24 hours 10/03/21 0815 10/04/21 0953         Subjective: Seen and examined.  She has no complaints.  She prefers to go to SNF near her home in [place] area.  Objective: Vitals:   10/08/21 0438 10/08/21 0500 10/08/21 0722 10/08/21 1120  BP: (!) 112/42  (!) 148/54 (!) 159/61  Pulse: 66  69 66  Resp: '17  19 18  '$ Temp: 98.3 F (36.8 C)  98.4 F (36.9 C) 98.4 F (36.9 C)  TempSrc: Oral  Oral Oral  SpO2: 97%  98% 97%  Weight:  105.3 kg    Height:        Intake/Output Summary (Last 24 hours) at 10/08/2021 1205 Last data filed at 10/08/2021 0900 Gross per 24 hour  Intake 240 ml  Output 1650 ml  Net -1410 ml    Filed Weights   10/03/21 0517 10/08/21 0500  Weight: 98.9 kg  105.3 kg    Examination:  General exam: Appears calm and comfortable, morbidly obese Respiratory system: Clear to auscultation. Respiratory effort normal. Cardiovascular system: S1 & S2 heard, RRR. No JVD, murmurs, rubs, gallops or clicks. No pedal edema. Gastrointestinal system: Abdomen is nondistended, soft and nontender. No organomegaly or masses felt. Normal bowel sounds heard. Central nervous system: Alert and oriented. No focal neurological deficits. Extremities: Symmetric 5 x 5 power. Skin: No rashes, lesions or ulcers.  Psychiatry: Judgement and insight appear normal. Mood & affect appropriate.   Data Reviewed: I have personally reviewed following labs and imaging studies  CBC: Recent Labs  Lab 10/03/21 0531 10/03/21 0611 10/03/21 0903 10/04/21 0416 10/05/21 0312 10/06/21 0149  WBC 6.8  --   --  13.0* 9.6 6.9  NEUTROABS 4.7  --   --   --   --   --   HGB 10.9* 10.2*  9.9* 8.8* 9.8* 8.9* 9.1*  HCT 32.0* 30.0*  29.0* 26.0* 30.3* 28.3* 27.3*  MCV 95.2  --   --  98.4 98.3 95.5  PLT 159  --   --  153 123* 116*    Basic Metabolic Panel: Recent Labs  Lab 10/03/21 1018 10/03/21 1459 10/04/21 0416 10/04/21 1532 10/05/21 0312 10/06/21 0149 10/07/21 0124 10/08/21 0218  NA  --   --  145  --  143 143 142 139  K  --   --  4.5  --  3.9 4.1 4.6 4.4  CL  --   --  117*  --  111 115* 110 107  CO2  --   --  21*  --  '22 24 22 23  '$ GLUCOSE  --   --  210*  --  69* 118* 161* 146*  BUN  --   --  24*  --  27* 30* 36* 39*  CREATININE  --   --  1.65*  --  1.78* 1.70* 1.86* 1.95*  CALCIUM  --   --  7.9*  --  8.0* 8.0* 7.9* 8.0*  MG 1.5* 1.6* 2.1 2.0  --   --   --   --   PHOS 2.7 2.7 3.0 3.1  --   --   --   --     GFR: Estimated Creatinine Clearance: 27.9 mL/min (A) (by C-G formula based on SCr of 1.95 mg/dL (H)). Liver Function Tests: Recent Labs  Lab 10/03/21 0531  AST 40  ALT 20  ALKPHOS 70  BILITOT 1.7*  PROT 5.3*  ALBUMIN 2.7*    No results for input(s): "LIPASE",  "AMYLASE" in the last 168 hours. No results for input(s): "AMMONIA" in the last 168 hours. Coagulation Profile: No results for input(s): "INR", "PROTIME" in the last 168 hours. Cardiac Enzymes: No results for input(s): "CKTOTAL", "CKMB", "CKMBINDEX", "TROPONINI" in the last 168 hours. BNP (last 3 results) No results for input(s): "PROBNP" in the last 8760 hours. HbA1C: No results for input(s): "HGBA1C" in the last 72 hours. CBG: Recent Labs  Lab 10/07/21 1233 10/07/21 1707 10/07/21 2104 10/08/21 0632 10/08/21 1118  GLUCAP 134* 125* 161* 120* 186*    Lipid Profile: No results for input(s): "CHOL", "HDL", "LDLCALC", "TRIG", "CHOLHDL", "LDLDIRECT" in the last 72 hours. Thyroid Function Tests: No results for input(s): "TSH", "T4TOTAL", "FREET4", "T3FREE", "THYROIDAB" in the last 72 hours. Anemia Panel: No results for input(s): "VITAMINB12", "FOLATE", "FERRITIN", "TIBC", "IRON", "RETICCTPCT" in the last 72 hours. Sepsis Labs: Recent Labs  Lab 10/03/21 0537 10/03/21 1018 10/03/21 1459 10/04/21 0416 10/05/21 0312 10/08/21 0218  PROCALCITON  --  0.10  --  0.27 0.44 0.29  LATICACIDVEN 2.2* 1.5 1.2  --   --   --      Recent Results (from the past 240 hour(s))  Blood Culture (Routine X 2)     Status: Abnormal   Collection Time: 10/03/21  5:33 AM   Specimen: BLOOD  Result Value Ref Range Status   Specimen Description BLOOD SITE NOT SPECIFIED  Final   Special Requests   Final    BOTTLES DRAWN AEROBIC AND ANAEROBIC Blood Culture adequate volume   Culture  Setup Time   Final    GRAM POSITIVE COCCI AEROBIC BOTTLE ONLY CRITICAL RESULT CALLED TO, READ BACK BY AND VERIFIED WITH: T RUDISILL,PHARMD'@0359'$   10/04/21 mk Performed at [facility] [address], [place], [place] [address]    Culture STAPHYLOCOCCUS LUGDUNENSIS (A)  Final   Report Status 10/06/2021 FINAL  Final   Organism ID, Bacteria STAPHYLOCOCCUS LUGDUNENSIS  Final      Susceptibility   Staphylococcus  lugdunensis - MIC*    CIPROFLOXACIN 4 RESISTANT Resistant     ERYTHROMYCIN >=8 RESISTANT Resistant     GENTAMICIN <=0.5 SENSITIVE Sensitive     OXACILLIN <=0.25 SENSITIVE Sensitive     TETRACYCLINE >=16 RESISTANT Resistant     VANCOMYCIN 1 SENSITIVE Sensitive     TRIMETH/SULFA <=10 SENSITIVE Sensitive     CLINDAMYCIN RESISTANT Resistant     RIFAMPIN <=0.5 SENSITIVE Sensitive     Inducible Clindamycin POSITIVE Resistant     * STAPHYLOCOCCUS LUGDUNENSIS  Blood Culture (Routine X 2)     Status: None   Collection Time: 10/03/21  5:33 AM   Specimen: BLOOD  Result Value Ref Range Status   Specimen Description BLOOD SITE NOT SPECIFIED  Final   Special Requests   Final    BOTTLES DRAWN AEROBIC AND ANAEROBIC Blood Culture results may not be optimal due to an excessive volume of blood received in culture bottles   Culture   Final    NO GROWTH 5 DAYS Performed at [facility], [place] [address] [place], [place] [address]    Report Status 10/08/2021 FINAL  Final  Blood Culture ID Panel (Reflexed)     Status: Abnormal   Collection Time: 10/03/21  5:33 AM  Result Value Ref Range Status   Enterococcus faecalis NOT DETECTED NOT DETECTED Final   Enterococcus Faecium NOT DETECTED NOT DETECTED Final   Listeria monocytogenes NOT DETECTED NOT DETECTED Final   Staphylococcus species DETECTED (A) NOT DETECTED Final    Comment: CRITICAL RESULT CALLED TO, READ BACK BY AND VERIFIED WITH: T RUDISILL,PHARMD'@0400'$  10/04/21 [place]    Staphylococcus aureus (BCID) NOT DETECTED NOT DETECTED Final   Staphylococcus epidermidis NOT DETECTED NOT DETECTED Final   Staphylococcus lugdunensis DETECTED (A) NOT DETECTED Final    Comment: CRITICAL RESULT CALLED TO, READ BACK BY AND VERIFIED WITH: T RUDISILL,PHARMD'@0400'$  10/04/21 [place]    Streptococcus species NOT DETECTED NOT DETECTED Final   Streptococcus agalactiae NOT DETECTED NOT DETECTED Final   Streptococcus pneumoniae NOT DETECTED NOT DETECTED Final   Streptococcus  pyogenes NOT DETECTED NOT DETECTED Final   A.calcoaceticus-baumannii NOT DETECTED NOT DETECTED Final   Bacteroides fragilis NOT DETECTED NOT DETECTED Final   Enterobacterales NOT DETECTED NOT DETECTED Final   Enterobacter cloacae complex NOT DETECTED NOT DETECTED Final   Escherichia coli NOT DETECTED NOT DETECTED Final   Klebsiella aerogenes NOT DETECTED NOT DETECTED Final   Klebsiella oxytoca NOT DETECTED NOT DETECTED Final   Klebsiella pneumoniae NOT DETECTED NOT DETECTED Final   Proteus species NOT DETECTED NOT DETECTED Final   Salmonella species NOT DETECTED NOT DETECTED Final   Serratia marcescens NOT DETECTED NOT DETECTED Final   Haemophilus influenzae NOT DETECTED NOT DETECTED Final   Neisseria meningitidis NOT DETECTED NOT DETECTED Final   Pseudomonas aeruginosa NOT DETECTED NOT DETECTED Final   Stenotrophomonas maltophilia NOT DETECTED NOT DETECTED Final   Candida albicans NOT DETECTED NOT DETECTED Final   Candida auris NOT DETECTED NOT DETECTED Final   Candida glabrata NOT DETECTED NOT DETECTED Final   Candida krusei NOT DETECTED NOT DETECTED Final   Candida parapsilosis NOT DETECTED NOT DETECTED Final   Candida tropicalis NOT DETECTED NOT DETECTED Final  Cryptococcus neoformans/gattii NOT DETECTED NOT DETECTED Final   Methicillin resistance mecA/C NOT DETECTED NOT DETECTED Final    Comment: Performed at [facility], [place] [address], [place], [place] [address]  Culture, blood (Routine X 2) w Reflex to ID Panel     Status: None   Collection Time: 10/03/21 10:18 AM   Specimen: BLOOD LEFT HAND  Result Value Ref Range Status   Specimen Description BLOOD LEFT HAND  Final   Special Requests   Final    BOTTLES DRAWN AEROBIC ONLY Blood Culture adequate volume   Culture   Final    NO GROWTH 5 DAYS Performed at [facility], [place] [address] [place], [place] [address]    Report Status 10/08/2021 FINAL  Final  Culture, blood (Routine X 2) w Reflex to ID Panel      Status: None   Collection Time: 10/03/21 10:38 AM   Specimen: BLOOD LEFT ARM  Result Value Ref Range Status   Specimen Description BLOOD LEFT ARM  Final   Special Requests   Final    BOTTLES DRAWN AEROBIC ONLY Blood Culture results may not be optimal due to an inadequate volume of blood received in culture bottles   Culture   Final    NO GROWTH 5 DAYS Performed at [facility], [place] [address], [place], [place] [address]    Report Status 10/08/2021 FINAL  Final  MRSA Next Gen by PCR, Nasal     Status: None   Collection Time: 10/03/21 10:51 AM   Specimen: Urine, Catheterized; Nasal Swab  Result Value Ref Range Status   MRSA by PCR Next Gen NOT DETECTED NOT DETECTED Final    Comment: (NOTE) The GeneXpert MRSA Assay (FDA approved for NASAL specimens only), is one component of a comprehensive MRSA colonization surveillance program. It is not intended to diagnose MRSA infection nor to guide or monitor treatment for MRSA infections. Test performance is not FDA approved in patients less than 48 years old. Performed at [facility], [place] [address] [place], [place] [address]   Culture, blood (Routine X 2) w Reflex to ID Panel     Status: None (Preliminary result)   Collection Time: 10/05/21  3:12 AM   Specimen: BLOOD  Result Value Ref Range Status   Specimen Description BLOOD LEFT ANTECUBITAL  Final   Special Requests   Final    BOTTLES DRAWN AEROBIC AND ANAEROBIC Blood Culture adequate volume   Culture   Final    NO GROWTH 3 DAYS Performed at [facility], [place] [address] [place], [place] [address]    Report Status PENDING  Incomplete  Culture, blood (Routine X 2) w Reflex to ID Panel     Status: None (Preliminary result)   Collection Time: 10/05/21  3:14 AM   Specimen: BLOOD RIGHT HAND  Result Value Ref Range Status   Specimen Description BLOOD RIGHT HAND  Final   Special Requests   Final    BOTTLES DRAWN AEROBIC AND ANAEROBIC Blood Culture adequate volume    Culture   Final    NO GROWTH 3 DAYS Performed at [facility], [place] [address], [place],  [address]    Report Status PENDING  Incomplete     Radiology Studies: No results found.  Scheduled Meds:  aspirin EC  81 mg Oral Daily   [START ON 10/09/2021] famotidine  20 mg Oral Daily   feeding supplement  237 mL Oral BID  BM   fenofibrate  160 mg Oral q morning   furosemide  20 mg Oral BID   heparin  5,000 Units Subcutaneous Q8H   insulin aspart  0-15 Units Subcutaneous TID WC   insulin aspart  0-5 Units Subcutaneous QHS   lipase/protease/amylase  72,000 Units Oral BID WC   metoprolol succinate  25 mg Oral Daily   mouth rinse  15 mL Mouth Rinse 4 times per day   pantoprazole  40 mg Oral QHS   simvastatin  20 mg Oral QPM   Continuous Infusions:  sodium chloride     sodium chloride Stopped (10/03/21 1557)    ceFAZolin (ANCEF) IV 2 g (10/08/21 0933)     LOS: 5 days   Darliss Cheney, MD [facility]  10/08/2021, 12:05 PM   *Please note that this is a verbal dictation therefore any spelling or grammatical errors are due to the "[employer]" system interpretation.  Please page via [place] and do not message via secure chat for urgent patient care matters. Secure chat can be used for non urgent patient care matters.  How to contact the [facility] Attending or Consulting provider [place] or covering provider during after hours [place], for this patient?  Check the care team in [facility] and look for a) attending/consulting TRH provider listed and b) the [facility] team listed. Page or secure chat [phone]. Log into [email or web address] and use [place]'s universal password to access. If you do not have the password, please contact the hospital operator. Locate the [facility] provider you are looking for under [facility] and page to a number that you can be directly reached. If you still have difficulty reaching the provider, please page the [facility] (Director on Call) for the Hospitalists listed on  amion for assistance.

## 2021-10-08 NOTE — Plan of Care (Signed)
  Problem: Education: Goal: Ability to describe self-care measures that may prevent or decrease complications (Diabetes Survival Skills Education) will improve Outcome: Progressing Goal: Individualized Educational Video(s) Outcome: Progressing   Problem: Coping: Goal: Ability to adjust to condition or change in health will improve Outcome: Progressing   Problem: Fluid Volume: Goal: Ability to maintain a balanced intake and output will improve Outcome: Progressing   Problem: Health Behavior/Discharge Planning: Goal: Ability to identify and utilize available resources and services will improve Outcome: Progressing Goal: Ability to manage health-related needs will improve Outcome: Progressing   Problem: Metabolic: Goal: Ability to maintain appropriate glucose levels will improve Outcome: Progressing   Problem: Nutritional: Goal: Maintenance of adequate nutrition will improve Outcome: Progressing Goal: Progress toward achieving an optimal weight will improve Outcome: Progressing   Problem: Skin Integrity: Goal: Risk for impaired skin integrity will decrease Outcome: Progressing   Problem: Tissue Perfusion: Goal: Adequacy of tissue perfusion will improve Outcome: Progressing   Problem: Safety: Goal: Non-violent Restraint(s) Outcome: Progressing   Problem: Education: Goal: Knowledge of General Education information will improve Description: Including pain rating scale, medication(s)/side effects and non-pharmacologic comfort measures Outcome: Progressing   Problem: Health Behavior/Discharge Planning: Goal: Ability to manage health-related needs will improve Outcome: Progressing   Problem: Clinical Measurements: Goal: Ability to maintain clinical measurements within normal limits will improve Outcome: Progressing Goal: Will remain free from infection Outcome: Progressing Goal: Diagnostic test results will improve Outcome: Progressing Goal: Respiratory complications  will improve Outcome: Progressing Goal: Cardiovascular complication will be avoided Outcome: Progressing   Problem: Activity: Goal: Risk for activity intolerance will decrease Outcome: Progressing   Problem: Nutrition: Goal: Adequate nutrition will be maintained Outcome: Progressing   Problem: Coping: Goal: Level of anxiety will decrease Outcome: Progressing   Problem: Elimination: Goal: Will not experience complications related to bowel motility Outcome: Progressing Goal: Will not experience complications related to urinary retention Outcome: Progressing   Problem: Pain Managment: Goal: General experience of comfort will improve Outcome: Progressing   Problem: Safety: Goal: Ability to remain free from injury will improve Outcome: Progressing   Problem: Skin Integrity: Goal: Risk for impaired skin integrity will decrease Outcome: Progressing   

## 2021-10-08 NOTE — Progress Notes (Signed)
Occupational Therapy Treatment Patient Details Name: Elizabeth Crosby MRN: [id number] DOB: 1945/07/22 Today's Date: 10/08/2021   History of present illness The pt is a 76 yo female presenting 7/5 with AMS and after sustaining 2 falls at home. Pt given narcan by EMS with improvement in respirations. Concern for pt accidentially taking extra dose of medications. GCS of 8 upon arrival, pt intubated due to respiratory distress, extubated 7/6. PMH includes: CKD III, DM, GERD, anxiety, gout, HLD, HTN, and obesity.   OT comments  Patient supine in bed and eager to get OOB.  Completing bed mobility with min guard assist, increased time required. Stand pivot to recliner, simulated toilet transfer, with min assist using RW with cueing for hand placement and safety.  Patient continues to present with slow processing, cueing for safety and difficulty follow multiple step commands. Will follow acutely, continue to recommend SNF.    Recommendations for follow up therapy are one component of a multi-disciplinary discharge planning process, led by the attending physician.  Recommendations may be updated based on patient status, additional functional criteria and insurance authorization.    Follow Up Recommendations  Skilled nursing-short term rehab (<3 hours/day)    Assistance Recommended at Discharge Frequent or constant Supervision/Assistance  Patient can return home with the following  A lot of help with walking and/or transfers;A lot of help with bathing/dressing/bathroom;Assistance with cooking/housework;Direct supervision/assist for medications management;Direct supervision/assist for financial management;Assist for transportation;Help with stairs or ramp for entrance   Equipment Recommendations  None recommended by OT    Recommendations for Other Services      Precautions / Restrictions Precautions Precautions: Fall Restrictions Weight Bearing Restrictions: No       Mobility Bed  Mobility Overal bed mobility: Needs Assistance Bed Mobility: Supine to Sit     Supine to sit: Min guard, HOB elevated     General bed mobility comments: min guard for safety, increased time to elevate trunk and scoot forward but not phyiscal assistance required    Transfers Overall transfer level: Needs assistance Equipment used: Rolling walker (2 wheels) Transfers: Sit to/from Stand, Bed to chair/wheelchair/BSC Sit to Stand: Min assist     Step pivot transfers: Min guard     General transfer comment: cueing for hand placement and safety, step pivot to recliner with increased time     Balance Overall balance assessment: Needs assistance Sitting-balance support: No upper extremity supported, Feet supported Sitting balance-Leahy Scale: Fair     Standing balance support: Bilateral upper extremity supported, During functional activity Standing balance-Leahy Scale: Poor Standing balance comment: needs at least 1 UE support while standing, however heavily relied on RW while standing                           ADL either performed or assessed with clinical judgement   ADL Overall ADL's : Needs assistance/impaired     Grooming: Set up;Supervision/safety;Sitting               Lower Body Dressing: Maximal assistance;Sit to/from stand Lower Body Dressing Details (indicate cue type and reason): assist for socks, min assist sit to stand Toilet Transfer: Minimal assistance;Stand-pivot;Rolling walker (2 wheels) Toilet Transfer Details (indicate cue type and reason): simulated to recliner         Functional mobility during ADLs: Minimal assistance;Rolling walker (2 wheels) General ADL Comments: plan for AE education next session    Extremity/Trunk Assessment  Vision       Perception     Praxis      Cognition Arousal/Alertness: Awake/alert Behavior During Therapy: Flat affect Overall Cognitive Status: Impaired/Different from  baseline Area of Impairment: Attention, Memory, Following commands, Safety/judgement, Problem solving, Awareness, Orientation                 Orientation Level: Disoriented to, Time Current Attention Level: Sustained Memory: Decreased short-term memory Following Commands: Follows one step commands consistently Safety/Judgement: Decreased awareness of safety, Decreased awareness of deficits Awareness: Emergent Problem Solving: Slow processing, Decreased initiation, Difficulty sequencing, Requires verbal cues, Requires tactile cues General Comments: pt reports july 2023 or 2024, unable to recall date or day of week. She presents with slow processing and is repetative with telling therapist about her husband (amputee/ sleeps in).  Cueing for hand placement with poor recall during session.        Exercises      Shoulder Instructions       General Comments VSS on RA; noted L knee wound bleeding after transfer to recliner--RN notified and addressing as therapist exiting room    Pertinent Vitals/ Pain       Pain Assessment Pain Assessment: No/denies pain  Home Living                                          Prior Functioning/Environment              Frequency  Min 2X/week        Progress Toward Goals  OT Goals(current goals can now be found in the care plan section)  Progress towards OT goals: Progressing toward goals  Acute Rehab OT Goals Patient Stated Goal: to get home OT Goal Formulation: With patient Time For Goal Achievement: 10/19/21 Potential to Achieve Goals: Good  Plan Discharge plan remains appropriate;Frequency remains appropriate    Co-evaluation                 AM-PAC OT "6 Clicks" Daily Activity     Outcome Measure   Help from another person eating meals?: None Help from another person taking care of personal grooming?: A Little Help from another person toileting, which includes using toliet, bedpan, or urinal?: A  Lot Help from another person bathing (including washing, rinsing, drying)?: A Lot Help from another person to put on and taking off regular upper body clothing?: A Little Help from another person to put on and taking off regular lower body clothing?: A Lot 6 Click Score: 16    End of Session Equipment Utilized During Treatment: Rolling walker (2 wheels);Gait belt  OT Visit Diagnosis: Unsteadiness on feet (R26.81);Other abnormalities of gait and mobility (R26.89);Muscle weakness (generalized) (M62.81);History of falling (Z91.81);Other symptoms and signs involving cognitive function;Pain   Activity Tolerance Patient tolerated treatment well   Patient Left in chair;with call bell/phone within reach;with chair alarm set;with family/visitor present;with nursing/sitter in room   Nurse Communication Mobility status        Time: 0272-5366 OT Time Calculation (min): 27 min  Charges: OT General Charges $OT Visit: 1 Visit OT Treatments $Self Care/Home Management : 23-37 mins  [place] Office [phone]   Delight Stare 10/08/2021, 11:08 AM

## 2021-10-08 NOTE — TOC Progression Note (Signed)
Transition of Care (TOC) - Progression Note    Patient Details  Name: Elizabeth Crosby MRN: [id number] Date of Birth: 09/08/1945  Transition of Care (TOC) CM/SW Contact  Elizabeth M Paisley, LCSW Phone Number: 10/08/2021, 3:56 PM  Clinical Narrative:   CSW met with patient and spouse to discuss bed offers for SNF. Patient and spouse talked with CSW about taking patient home instead. Lengthy discussion with patient and spouse about barriers to getting patient home, including wheelchair ramp and bathroom remodel, but patient and spouse adamant about going home instead. CSW asked PT to see patient to determine safety of going home, and patient is still being recommended for SNF. Patient and spouse agreeable after PT session, preference for [facility]. CSW confirmed with [facility] bed availability and initiated insurance authorization. CSW to follow.    Expected Discharge Plan: Skilled Nursing Facility Barriers to Discharge: Continued Medical Work up, Insurance Authorization, SNF Pending bed offer  Expected Discharge Plan and Services Expected Discharge Plan: Skilled Nursing Facility In-house Referral: Clinical Social Work   Post Acute Care Choice: Skilled Nursing Facility Living arrangements for the past 2 months: Single Family Home                                       Social Determinants of Health (SDOH) Interventions    Readmission Risk Interventions     No data to display          

## 2021-10-08 NOTE — Progress Notes (Signed)
Speech Language Pathology Treatment: Dysphagia  Patient Details Name: Elizabeth Crosby MRN: [id number] DOB: 1945-09-20 Today's Date: 10/08/2021 Time: 5573-2202 SLP Time Calculation (min) (ACUTE ONLY): 29 min  Assessment / Plan / Recommendation Clinical Impression  Pt noted with order for regular diet and thin liquids this am prior to session. Per MBS (7/7), pt with aspiration of thin liquids in larger volumes and swallow strategies were only intermittently effective in reducing aspiration. Regular diet with NTL recommended at that time with water protocol. This am, RN reports pt taking meds whole with thin liquids without difficulty. With clinician at bedside, she self-fed sips of thin liquids by cup and straw (large volumes and consecutive swallows), with pt demonstrating x2 mild throat clearing across multiple trials. Regular textured solid masticated and cleared without difficulty. Given aspiration during MBS was sensed, suspect airway invasion was minimal-none during today's session. Pt reports coughing while eating/drinking in a reclined position. Educated regarding universal swallow precautions (small bites/sips, slow rate of intake, upright positioning for POs and 30 minutes post). Pt demonstrated use of small bites/sips and slow rate of intake with 100% accuracy during trials. Recommend continue regular/thin liquid diet, SLP to f/u briefly to ensure tolerance and use of strategies.    HPI HPI: Patient is a 76 y.o. female with PMH: DM, GERD anxiety, gout, glaucoma, HTN, OSA nonadherent with CPAP, CKD III. She presented to the hospital via EMS on 10/03/21 from home after husband heard her fall in middle of the night and she was acting as if "drunk" but no ETOH suspected. Husband had reported that she has gotten a couple of her meds mixed up in the past and taken too much by accident. In ER, GCS was 8 and she had respiratory distress. She was intubated for airway protection on 7/5. UDS was negative, CT  head negative for acute intracranial process, CXR showed right greater than left interstitial edema, effusions. She was extubated 10/04/21 at 0929 to 2L [place]. RN concerned about patient's persistent coughing with PO's, prompting swallow eval order with SLP.      SLP Plan  Continue with current plan of care      Recommendations for follow up therapy are one component of a multi-disciplinary discharge planning process, led by the attending physician.  Recommendations may be updated based on patient status, additional functional criteria and insurance authorization.    Recommendations  Diet recommendations: Regular;Thin liquid Liquids provided via: Cup;Straw Medication Administration: Whole meds with liquid Supervision: Patient able to self feed Compensations: Minimize environmental distractions;Slow rate;Small sips/bites Postural Changes and/or Swallow Maneuvers: Seated upright 90 degrees                Oral Care Recommendations: Oral care BID;Staff/trained caregiver to provide oral care Follow Up Recommendations: Skilled nursing-short term rehab (<3 hours/day) Assistance recommended at discharge: Frequent or constant Supervision/Assistance SLP Visit Diagnosis: Dysphagia, pharyngeal phase (R13.13) Plan: Continue with current plan of care           Ellwood Dense, [place], [place] Office Number: [place]  10/08/2021, 11:21 AM

## 2021-10-08 NOTE — Progress Notes (Signed)
PHARMACY NOTE:  ANTIMICROBIAL RENAL DOSAGE ADJUSTMENT  Current antimicrobial regimen includes a mismatch between antimicrobial dosage and estimated renal function.  As per policy approved by the Pharmacy & Therapeutics and Medical Executive Committees, the antimicrobial dosage will be adjusted accordingly.  Current antimicrobial dosage:  Cefazolin 2 gm IV Q 8 hours  Indication: Staph lugdunensis bacteremia   Renal Function:  Estimated Creatinine Clearance: 27.9 mL/min (A) (by C-G formula based on SCr of 1.95 mg/dL (H)). '[]'$      On intermittent HD, scheduled: '[]'$      On CRRT    Antimicrobial dosage has been changed to:  Cefazolin 2 gm IV Q 12 hours   Additional comments:   Thank you for allowing pharmacy to be a part of this patient's care.  Jimmy Footman, PharmD, BCPS, BCIDP Infectious Diseases Clinical Pharmacist Phone: [phone] 10/08/2021 12:17 PM

## 2021-10-08 NOTE — Progress Notes (Signed)
Physical Therapy Treatment Patient Details Name: Elizabeth Crosby MRN: [id number] DOB: Feb 15, 1946 Today's Date: 10/08/2021   History of Present Illness The pt is a 76 yo female presenting 7/5 with AMS and after sustaining 2 falls at home. Pt given narcan by EMS with improvement in respirations. Concern for pt accidentially taking extra dose of medications. GCS of 8 upon arrival, pt intubated due to respiratory distress, extubated 7/6. PMH includes: CKD III, DM, GERD, anxiety, gout, HLD, HTN, and obesity.    PT Comments    Pt is progressing well towards goals of PT. Pt's husband was present during the session and PT had a conversation about d/c recommendations, husband and pt agreeable to SNF at this time. Pt demonstrated recall of safe use of RW and education provided on safe transfers. Today's session focused on functional mobility progression, with pt tolerating 6 ft of ambulation with a RW. Increased time during today's session due to transfer to [facility] for a BM. Pt is min A for bed mobility, transfers, and ambulation. Pt continues to present with impairments listed below and would continue to benefit from skilled PT. Will continue to follow acutely.    Recommendations for follow up therapy are one component of a multi-disciplinary discharge planning process, led by the attending physician.  Recommendations may be updated based on patient status, additional functional criteria and insurance authorization.  Follow Up Recommendations  Skilled nursing-short term rehab (<3 hours/day) Can patient physically be transported by private vehicle: No   Assistance Recommended at Discharge Frequent or constant Supervision/Assistance  Patient can return home with the following Assistance with cooking/housework;Assistance with feeding;Direct supervision/assist for medications management;Direct supervision/assist for financial management;Help with stairs or ramp for entrance;Assist for transportation;A little help  with walking and/or transfers;A lot of help with bathing/dressing/bathroom   Equipment Recommendations  None recommended by PT    Recommendations for Other Services       Precautions / Restrictions Precautions Precautions: Fall Restrictions Weight Bearing Restrictions: No     Mobility  Bed Mobility Overal bed mobility: Needs Assistance Bed Mobility: Sit to Supine       Sit to supine: Min assist   General bed mobility comments: min A for LE manuevering and trunk lowering with cues for scooting hips to middle of bed    Transfers Overall transfer level: Needs assistance Equipment used: Rolling walker (2 wheels), 2 person hand held assist Transfers: Sit to/from Stand, Bed to chair/wheelchair/BSC Sit to Stand: Min assist   Step pivot transfers: Min assist       General transfer comment: step pivot from chair to [facility], sit to stand from chair x2 and BSC x1. min A for transfers for safety with cues for upright posture upon standing. 2 HHA for transfer to [facility].    Ambulation/Gait Ambulation/Gait assistance: Min assist Gait Distance (Feet): 6 Feet Assistive device: Rolling walker (2 wheels) Gait Pattern/deviations: Step-to pattern, Decreased stride length, Narrow base of support, Trunk flexed Gait velocity: decreased Gait velocity interpretation: <1.31 ft/sec, indicative of household ambulator   General Gait Details: pt with slow and steady gait pattern with cues for upright posture and safe positioning of RW.   Stairs             Wheelchair Mobility    Modified Rankin (Stroke Patients Only)       Balance Overall balance assessment: Needs assistance Sitting-balance support: Feet supported, No upper extremity supported Sitting balance-Leahy Scale: Fair Sitting balance - Comments: able to support self with 1 UE  Standing balance support: Reliant on assistive device for balance, Bilateral upper extremity supported Standing balance-Leahy Scale: Poor Standing  balance comment: heavy reliance on UE support during today's session, pt with c/o "feeling wobbly."                            Cognition Arousal/Alertness: Awake/alert Behavior During Therapy: Flat affect Overall Cognitive Status: Impaired/Different from baseline Area of Impairment: Orientation, Attention, Memory, Following commands                 Orientation Level: Disoriented to, Time Current Attention Level: Sustained Memory: Decreased short-term memory Following Commands: Follows one step commands with increased time       General Comments: pt with improved awareness of safety and deficits demonstrating vocalizing steps for safe transfers, "I remember what you taught me about safety." continues to demonstrate disorientation to time, stating it's July 2003.        Exercises      General Comments        Pertinent Vitals/Pain Pain Assessment Pain Assessment: No/denies pain    Home Living                          Prior Function            PT Goals (current goals can now be found in the care plan section) Acute Rehab PT Goals Patient Stated Goal: to be more independent prior to return home PT Goal Formulation: With patient Time For Goal Achievement: 10/18/21 Potential to Achieve Goals: Good Progress towards PT goals: Progressing toward goals    Frequency    Min 3X/week      PT Plan Current plan remains appropriate    Co-evaluation              AM-PAC PT "6 Clicks" Mobility   Outcome Measure  Help needed turning from your back to your side while in a flat bed without using bedrails?: A Little Help needed moving from lying on your back to sitting on the side of a flat bed without using bedrails?: A Little Help needed moving to and from a bed to a chair (including a wheelchair)?: A Little Help needed standing up from a chair using your arms (e.g., wheelchair or bedside chair)?: A Little Help needed to walk in hospital  room?: Total Help needed climbing 3-5 steps with a railing? : Total 6 Click Score: 14    End of Session Equipment Utilized During Treatment: Gait belt Activity Tolerance: Patient tolerated treatment well;Patient limited by fatigue Patient left: in bed;with call bell/phone within reach;with bed alarm set;with family/visitor present Nurse Communication: Mobility status PT Visit Diagnosis: Unsteadiness on feet (R26.81);Repeated falls (R29.6);Muscle weakness (generalized) (M62.81)     Time: 3646-8032 PT Time Calculation (min) (ACUTE ONLY): 53 min  Charges:  $Gait Training: 23-37 mins $Therapeutic Activity: 23-37 mins                     Havery Moros, MS, SPT Acute Rehabilitation Services Office: [place] 10/08/2021, 3:12 PM

## 2021-10-08 NOTE — Care Management Important Message (Signed)
Important Message  Patient Details  Name: Elizabeth Crosby MRN: [id number] Date of Birth: 1946-03-15   Medicare Important Message Given:  Yes     Orbie Pyo 10/08/2021, 3:27 PM

## 2021-10-09 DIAGNOSIS — R52 Pain, unspecified: Secondary | ICD-10-CM | POA: Diagnosis not present

## 2021-10-09 DIAGNOSIS — N179 Acute kidney failure, unspecified: Secondary | ICD-10-CM | POA: Diagnosis not present

## 2021-10-09 DIAGNOSIS — E559 Vitamin D deficiency, unspecified: Secondary | ICD-10-CM | POA: Diagnosis not present

## 2021-10-09 DIAGNOSIS — E119 Type 2 diabetes mellitus without complications: Secondary | ICD-10-CM | POA: Diagnosis not present

## 2021-10-09 DIAGNOSIS — G8929 Other chronic pain: Secondary | ICD-10-CM | POA: Diagnosis not present

## 2021-10-09 DIAGNOSIS — R5381 Other malaise: Secondary | ICD-10-CM | POA: Diagnosis not present

## 2021-10-09 DIAGNOSIS — R0603 Acute respiratory distress: Secondary | ICD-10-CM | POA: Diagnosis not present

## 2021-10-09 DIAGNOSIS — K219 Gastro-esophageal reflux disease without esophagitis: Secondary | ICD-10-CM | POA: Diagnosis not present

## 2021-10-09 DIAGNOSIS — Z923 Personal history of irradiation: Secondary | ICD-10-CM | POA: Diagnosis not present

## 2021-10-09 DIAGNOSIS — R404 Transient alteration of awareness: Secondary | ICD-10-CM | POA: Diagnosis not present

## 2021-10-09 DIAGNOSIS — G9341 Metabolic encephalopathy: Secondary | ICD-10-CM | POA: Diagnosis not present

## 2021-10-09 DIAGNOSIS — Z743 Need for continuous supervision: Secondary | ICD-10-CM | POA: Diagnosis not present

## 2021-10-09 DIAGNOSIS — Z79899 Other long term (current) drug therapy: Secondary | ICD-10-CM | POA: Diagnosis not present

## 2021-10-09 DIAGNOSIS — I10 Essential (primary) hypertension: Secondary | ICD-10-CM | POA: Diagnosis not present

## 2021-10-09 DIAGNOSIS — E032 Hypothyroidism due to medicaments and other exogenous substances: Secondary | ICD-10-CM | POA: Diagnosis not present

## 2021-10-09 DIAGNOSIS — J690 Pneumonitis due to inhalation of food and vomit: Secondary | ICD-10-CM | POA: Diagnosis not present

## 2021-10-09 DIAGNOSIS — R7881 Bacteremia: Secondary | ICD-10-CM | POA: Diagnosis not present

## 2021-10-09 LAB — BASIC METABOLIC PANEL
Anion gap: 9 (ref 5–15)
BUN: 42 mg/dL — ABNORMAL HIGH (ref 8–23)
CO2: 23 mmol/L (ref 22–32)
Calcium: 8.3 mg/dL — ABNORMAL LOW (ref 8.9–10.3)
Chloride: 109 mmol/L (ref 98–111)
Creatinine, Ser: 1.84 mg/dL — ABNORMAL HIGH (ref 0.44–1.00)
GFR, Estimated: 28 mL/min — ABNORMAL LOW (ref >60)
Glucose, Bld: 122 mg/dL — ABNORMAL HIGH (ref 70–99)
Potassium: 4.2 mmol/L (ref 3.5–5.1)
Sodium: 141 mmol/L (ref 135–145)

## 2021-10-09 LAB — GLUCOSE, CAPILLARY
Glucose-Capillary: 109 mg/dL — ABNORMAL HIGH (ref 70–99)
Glucose-Capillary: 171 mg/dL — ABNORMAL HIGH (ref 70–99)

## 2021-10-09 MED ORDER — TRAMADOL HCL 50 MG PO TABS
50.0000 mg | ORAL_TABLET | Freq: Two times a day (BID) | ORAL | 0 refills | Status: DC | PRN
Start: 2021-10-09 — End: 2021-12-28

## 2021-10-09 MED ORDER — CEFAZOLIN IV (FOR PTA / DISCHARGE USE ONLY): 2.0000 g | Freq: Two times a day (BID) | INTRAVENOUS | 0 refills | Status: AC

## 2021-10-09 MED ORDER — TIZANIDINE HCL 2 MG PO TABS
1.0000 mg | ORAL_TABLET | Freq: Three times a day (TID) | ORAL | 0 refills | Status: DC | PRN
Start: 2021-10-09 — End: 2021-12-28

## 2021-10-09 NOTE — Plan of Care (Signed)
  Problem: Education: Goal: Ability to describe self-care measures that may prevent or decrease complications (Diabetes Survival Skills Education) will improve Outcome: Progressing   Problem: Coping: Goal: Ability to adjust to condition or change in health will improve Outcome: Progressing   Problem: Nutritional: Goal: Maintenance of adequate nutrition will improve Outcome: Progressing   Problem: Skin Integrity: Goal: Risk for impaired skin integrity will decrease Outcome: Progressing   

## 2021-10-09 NOTE — TOC Transition Note (Signed)
Transition of Care [facility]) - CM/SW Discharge Note   Patient Details  Name: [name] MRN: [id number] Date of Birth: 10/14/1945  [facility] CM/SW Contact:  Joanne Chars, LCSW Phone Number: 10/09/2021, 1:45 PM   Clinical Narrative:   Pt discharging to [facility], room 305P.  RN call report to [phone].    Final next level of care: Skilled Nursing Facility Barriers to Discharge: Barriers Resolved   Patient Goals and CMS Choice Patient states their goals for this hospitalization and ongoing recovery are:: Rehab CMS Medicare.gov Compare Post Acute Care list provided to:: Patient Choice offered to / list presented to : Patient, Spouse  Discharge Placement              Patient chooses bed at:  [facility]) Patient to be transferred to facility by: [place] Name of family member notified: daughter [name], could not reach husband Patient and family notified of of transfer: 10/09/21  Discharge Plan and Services In-house Referral: Clinical Social Work   Post Acute Care Choice: [place]                               Social Determinants of Health (SDOH) Interventions     Readmission Risk Interventions     No data to display

## 2021-10-09 NOTE — Progress Notes (Signed)
PHARMACY CONSULT NOTE FOR:  OUTPATIENT  PARENTERAL ANTIBIOTIC THERAPY (OPAT)  Indication: Staph lugdunensis bacteremia Regimen: Cefazolin 2 gm IV Q 12 hours End date: 10/17/21  IV antibiotic discharge orders are pended. To discharging provider:  please sign these orders via discharge navigator,  Select New Orders & click on the button choice - Manage This Unsigned Work.     Thank you for allowing pharmacy to be a part of this patient's care.  Jimmy Footman, PharmD, BCPS, BCIDP Infectious Diseases Clinical Pharmacist Phone: [phone] 10/09/2021, 11:33 AM

## 2021-10-09 NOTE — Discharge Summary (Signed)
PatientPhysician Discharge Summary  Elizabeth Crosby XNT:[id number] DOB: 07/30/1945 DOA: 10/03/2021  PCP: Elizabeth Neer, MD  Admit date: 10/03/2021 Discharge date: 10/09/2021 30 Day Unplanned Readmission Risk Score    Flowsheet Row ED to Hosp-Admission (Current) from 10/03/2021 in [facility]  30 Day Unplanned Readmission Risk Score (%) 22.81 Filed at 10/09/2021 1200       This score is the patient's risk of an unplanned readmission within 30 days of being discharged (0 -100%). The score is based on dignosis, age, lab data, medications, orders, and past utilization.   Low:  0-14.9   Medium: 15-21.9   High: 22-29.9   Extreme: 30 and above          Admitted From: Home Disposition: SNF  Recommendations for Outpatient Follow-up:  Follow up with PCP in 1-2 weeks Please obtain BMP/CBC in one week Please follow up with your PCP on the following pending results: Unresulted Labs (From admission, onward)    None         Home Health: None Equipment/Devices: None  Discharge Condition: Stable CODE STATUS: Full code Diet recommendation: Cardiac/diabetic  Subjective: Seen and examined.  Doing well.  No complaints.  Ready for discharge.  Brief/Interim Summary: 76 y/o F who presented to [facility] ER on 7/5 with reports of altered mental status and respiratory distress.    The patient presented to [facility] via EMS.  Her husband reports she got up in the middle of the night to go to the restroom and he heard her fall.  He reported she acted as if she was "drunk" but no ETOH suspected. He found her on her knees. She takes tramadol and tizanidine and has gotten her meds mixed up in the past and taken too much on accident.  On presentation to the ER, her GCS was 8 and she had respiratory distress. She was intubated for airway protection - reported to have desaturated with RSI, 2 attempts. UDS negative. CT Head negative for acute process. BNP pending.  Admitted under PCCM, extubated 10/05/2021  and transferred to [facility] on 10/06/2021.   PCCM called for ICU admission.    Daughter, Elizabeth Crosby [phone]), reports she occasionally will get her meds mixed up and she notes the patient to have slurring of her words.  The patient typically avoids going to the MD if possible.  She was recently seen by her PCP for routine check up and had a good report.  She notes that her "pancreas doesn't work well and she was recently started on meds to help".  She was eventually transferred under hospitalist service, details of hospitalization as below.   Acute Metabolic Encephalopathy: In setting of suspected accidental medication mix up, possible aspiration PNA. CT head/neck negative for acute process.  -initially intubated 7/5, extubated 7/6 - GCS 8 on presentation. Hx of similar admission in August.  UDS, ETOH negative. -worked with PT, they recommend SNF, patient is being discharged to SNF today in stable condition.  Gram-positive/Staphylococcus lugdunensis bacteremia 2/2 unclear source : Could be due to aspiration pneumonia.  Seen by ID.  Patient has remained on cefazolin since admission, they recommended continuing cefazolin through 10/17/2021.  TTE was done on 10/03/2021 which ruled out vegetations.  No TEE was recommended.  Acute Hypoxic Respiratory Failure in setting of Bilateral Infiltrates vs Effusions R>L OSA/aspiration pneumonia:  Staph bacteremia, Blood culture x1 positive for staph lugdensis, De-escalated abx to Cefazolin, echo reassuring, no artificial heart valves or implanted cardiac devices to increase risk of  endocarditis, repeat blood culture negative.   AKI on CKD IIIa Baseline sr cr ~ 1.1-1.2, presented with creatinine of 1.67 which remained stable basically in the range of between 1.7-1.9.  Perhaps this is her new baseline and if so, she will have CKD stage IV.    Sedation Related Hypotension  Noted in ER on propofol. Improved with cessation of propofol.  -resolved off sedation,  now slightly hypertensive   Mechanical Fall  CT head, neck negative for acute process  -in the setting of encephalopathy -PT eval recommended SNF placement.   History of grade 1 diastolic dysfunction/bilateral lower extremity edema: Continue PTA dose of Lasix 20 mg p.o. daily.   Type 2 diabetes mellitus: Blood sugar controlled on SSI.  Resume home medications.   Hypertension: Controlled.  Continue Toprol-XL.     Morbid obesity: Weight loss counseled.  Discharge plan was discussed with patient and/or family member and they verbalized understanding and agreed with it.  Discharge Diagnoses:  Principal Problem:   Acute metabolic encephalopathy Active Problems:   Morbid obesity ([place])   DM2 (diabetes mellitus, type 2) ([place])   Delirium   Respiratory distress   Gram-positive bacteremia   Acute renal failure superimposed on stage 3a chronic kidney disease ([place])   Accident due to mechanical fall without injury    Discharge Instructions  Discharge Instructions     Home infusion instructions   Complete by: As directed    Instructions: Flushing of vascular access device: 0.9% NaCl pre/post medication administration and prn patency; Heparin 100 u/ml, 49m for implanted ports and Heparin 10u/ml, 5568mfor all other central venous catheters.      Allergies as of 10/09/2021       Reactions   Ozempic (0.25 Or 0.5 Mg-dose) [semaglutide(0.25 Or 0.68m24mos)] Nausea And Vomiting   Amoxil [amoxicillin] Other (See Comments)   Cellulitis    Glucophage [metformin] Diarrhea   Invokana [canagliflozin] Other (See Comments)   UTI   Lipitor [atorvastatin] Other (See Comments)   Myalgias   Lyrica Cr [pregabalin Er] Other (See Comments)   Dizziness    Other Nausea And Vomiting   Pt states does not know drug but anesthesia drugs have made her have nausea and vomiting.    Sulfa Antibiotics Diarrhea   Trulicity [dulaglutide] Other (See Comments)   GI Upset   Potassium-containing Compounds Rash         Medication List     STOP taking these medications    telmisartan-hydrochlorothiazide 80-25 MG tablet Commonly known as: MICARDIS HCT       TAKE these medications    acetaminophen 500 MG tablet Commonly known as: TYLENOL Take 1 tablet (500 mg total) by mouth every 4 (four) hours as needed for moderate pain, headache or fever.   aspirin EC 81 MG tablet Take 81 mg by mouth daily.   ceFAZolin  IVPB Commonly known as: ANCEF Inject 2 g into the vein every 12 (twelve) hours for 8 days. Indication:  Staph lugdunensis bacteremia First Dose: Yes Last Day of Therapy:  10/17/21 Labs - Once weekly:  CBC/D and BMP, Labs - Every other week:  ESR and CRP   cetirizine 10 MG tablet Commonly known as: ZYRTEC Take 10 mg by mouth daily.   Creon 36000 UNITS Cpep capsule Generic drug: lipase/protease/amylase Take 36,36,144-31,540its by mouth See admin instructions. 2 capsules (72000 units) with each meal, 1 capsules (36000 units) with each snack   diclofenac Sodium 1 % Gel Commonly known as: VOLTAREN Apply 2 g  topically 2 (two) times daily as needed (pain).   famotidine 20 MG tablet Commonly known as: PEPCID Take 20 mg by mouth 2 (two) times daily.   fenofibrate 160 MG tablet Take 160 mg by mouth every morning.   furosemide 20 MG tablet Commonly known as: LASIX Take 20 mg by mouth 2 (two) times daily.   insulin degludec 200 UNIT/ML FlexTouch Pen Commonly known as: TRESIBA Inject 40 Units into the skin daily with breakfast.   ipratropium 0.06 % nasal spray Commonly known as: ATROVENT Place 2 sprays into both nostrils 3 (three) times daily as needed for allergies.   metoprolol succinate 25 MG 24 hr tablet Commonly known as: TOPROL-XL Take 25 mg by mouth daily.   NOVOLOG FLEXPEN [place] Inject 10-20 Units into the skin See admin instructions. 10 units at breakfast, 10 units at lunch, 20 units at dinner   omeprazole 20 MG capsule Commonly known as: PRILOSEC Take 20 mg by  mouth daily.   simvastatin 20 MG tablet Commonly known as: ZOCOR Take 20 mg by mouth every evening.   tiZANidine 2 MG tablet Commonly known as: ZANAFLEX Take 0.5-1 tablets (1-2 mg total) by mouth every 8 (eight) hours as needed for muscle spasms.   traMADol 50 MG tablet Commonly known as: ULTRAM Take 1 tablet (50 mg total) by mouth every 12 (twelve) hours as needed for severe pain or moderate pain. What changed: reasons to take this               Home Infusion Instuctions  (From admission, onward)           Start     Ordered   10/09/21 0000  Home infusion instructions       Question:  Instructions  Answer:  Flushing of vascular access device: 0.9% NaCl pre/post medication administration and prn patency; Heparin 100 u/ml, 22m for implanted ports and Heparin 10u/ml, 532mfor all other central venous catheters.   10/09/21 1256            Follow-up Information     ShMayra NeerMD Follow up in 1 week(s).   Specialty: Family Medicine Contact information: [address] [phone]              Allergies  Allergen Reactions   Ozempic (0.25 Or 0.5 Mg-Dose) [Semaglutide(0.25 Or 0.102m27mos)] Nausea And Vomiting   Amoxil [Amoxicillin] Other (See Comments)    Cellulitis    Glucophage [Metformin] Diarrhea   Invokana [Canagliflozin] Other (See Comments)    UTI   Lipitor [Atorvastatin] Other (See Comments)    Myalgias   Lyrica Cr [Pregabalin Er] Other (See Comments)    Dizziness    Other Nausea And Vomiting    Pt states does not know drug but anesthesia drugs have made her have nausea and vomiting.    Sulfa Antibiotics Diarrhea   Trulicity [Dulaglutide] Other (See Comments)    GI Upset   Potassium-Containing Compounds Rash    Consultations: PCCM   Procedures/Studies: DG Swallowing Func-Speech Pathology  Result Date: 10/05/2021 Table formatting from the original result was not included. Objective Swallowing  Evaluation: Type of Study: MBS-Modified Barium Swallow Study  Patient Details Name: BreGEARLDINE LOONEYN: [id number] of Birth: 9/210/06/1947day's Date: 10/05/2021 Time: SLP Start Time (ACUTE ONLY): 1350 -SLP Stop Time (ACUTE ONLY): 1410 SLP Time Calculation (min) (ACUTE ONLY): 20 min Past Medical History: Past Medical History: Diagnosis Date  Anxiety   Arthritis   "  knees are bone on bone and i see dr. Tonita Cong for the cortisone injections "   Cellulitis   hx  of in the legs   Chronic kidney disease (CKD), stage III (moderate) ([place])   Complication of anesthesia   doesnt know the name of the anesthesia , but i was vomiting while i was still under   Diabetes mellitus without complication ([place])   type 2   GERD (gastroesophageal reflux disease)   Glaucoma   Gout   History of blood transfusion   as child after accident, after childbirth/hemorroid surgery  History of kidney stones   History of radiation therapy   vaginal brachytherapy  11/27/2017-12/25/2017   Dr Gery Pray  Hyperlipidemia   Hypertension   Leukocytes in urine   Lumbar back pain   Sleep apnea   non adherant with cpap device  Past Surgical History: Past Surgical History: Procedure Laterality Date  APPENDECTOMY  1972  CATARACT EXTRACTION W/PHACO Right 01/10/2014  Procedure: CATARACT EXTRACTION PHACO AND INTRAOCULAR LENS PLACEMENT ([place]);  Surgeon: Tonny Branch, MD;  Location: [facility];  Service: Ophthalmology;  Laterality: Right;  CDE:5.78  CATARACT EXTRACTION W/PHACO Left 01/27/2014  Procedure: CATARACT EXTRACTION PHACO AND INTRAOCULAR LENS PLACEMENT LEFT EYE CDE=5.75;  Surgeon: Tonny Branch, MD;  Location: [facility];  Service: Ophthalmology;  Laterality: Left;  CHOLECYSTECTOMY  1972  COLONOSCOPY WITH PROPOFOL N/A 09/01/2012  Procedure: COLONOSCOPY WITH PROPOFOL;  Surgeon: Garlan Fair, MD;  Location: [facility];  Service: Endoscopy;  Laterality: N/A;  KIDNEY STONE SURGERY    OMENTECTOMY  09/30/2017  Procedure: OMENTECTOMY;  Surgeon: [name], MD;  Location: [facility];   Service: Gynecology;;  ROBOTIC ASSISTED TOTAL HYSTERECTOMY WITH BILATERAL SALPINGO OOPHERECTOMY Bilateral 09/30/2017  Procedure: ROBOTIC ASSISTED TOTAL HYSTERECTOMY WITH BILATERAL SALPINGO OOPHORECTOMY;  Surgeon: [name], MD;  Location: [facility];  Service: Gynecology;  Laterality: Bilateral; HPI: Patient is a 76 y.o. female with PMH: DM, GERD anxiety, gout, glaucoma, HTN, OSA nonadherent with CPAP, CKD III. She presented to the hospital via EMS on 10/03/21 from home after husband heard her fall in middle of the night and she was acting as if "drunk" but no ETOH suspected. Husband had reported that she has gotten a couple of her meds mixed up in the past and taken too much by accident. In ER, GCS was 8 and she had respiratory distress. She was intubated for airway protection on 7/5. UDS was negative, CT head negative for acute intracranial process, CXR showed right greater than left interstitial edema, effusions. She was extubated 10/04/21 at 0929 to 2L [place]. RN concerned about patient's persistent coughing with PO's, prompting swallow eval order with SLP.  Subjective: pleasant, some confusion, spouse and later daughter in room  Recommendations for follow up therapy are one component of a multi-disciplinary discharge planning process, led by the attending physician.  Recommendations may be updated based on patient status, additional functional criteria and insurance authorization. Assessment / Plan / Recommendation   10/05/2021   2:47 PM Clinical Impressions Clinical Impression Despite SLP's prompts and frequent repositioning, the view of pt's larynx was intermittently obscured by pt's shoulders. Pt presents with a pharyngeal delay which resulted in penetration (PAS 5) and aspiration (PAS 7) of thin liquids when consecutive swallows were used or large boluses consumed. The effectiveness of pt's cough could not be conclusively determined due to the suboptimal view of the larynx. Some functional benefit was noted with a chin  tuck posture, but the consistent effectiveness of this was unclear since  pt's shoulders frequently became elevated when implementing this posture. Reduced bolus sizes eliminated or improved laryngeal invasion to PAS 3. However, depth increased to PAS 5 once and subsequent aspiration (PAS 7) is suspected due to pt's coughing thereafter. Pt's diet will be advanced to regular texture solids and nectar thick liquids will be continued. However, thin water will be allowed between meals after oral care with observance of precautions (i.e, no straws & avoidance of consecutive swallows). Pt's prognosis for advancement to thin liquids is judged to be very good with training for consistent use of swallowing precautions. SLP Visit Diagnosis Dysphagia, pharyngeal phase (R13.13) Impact on safety and function Mild aspiration risk     10/05/2021   2:47 PM Treatment Recommendations Treatment Recommendations Therapy as outlined in treatment plan below     10/05/2021   2:47 PM Prognosis Prognosis for Safe Diet Advancement Good   10/05/2021   2:47 PM Diet Recommendations SLP Diet Recommendations Regular solids;Nectar thick liquid Liquid Administration via Cup;Straw Medication Administration Whole meds with puree Compensations Slow rate;Small sips/bites Postural Changes Seated upright at 90 degrees     10/05/2021   2:47 PM Other Recommendations Follow Up Recommendations Skilled nursing-short term rehab (<3 hours/day) Functional Status Assessment Patient has had a recent decline in their functional status and demonstrates the ability to make significant improvements in function in a reasonable and predictable amount of time.   10/05/2021   2:47 PM Frequency and Duration  Speech Therapy Frequency (ACUTE ONLY) min 2x/week Treatment Duration 2 weeks     10/05/2021   2:47 PM Oral Phase Oral Phase [facility]    10/05/2021   2:47 PM Pharyngeal Phase Pharyngeal Phase Impaired Pharyngeal- Nectar Straw Delayed swallow initiation-pyriform sinuses Pharyngeal- Thin Cup  Delayed swallow initiation-pyriform sinuses;Penetration/Aspiration during swallow Pharyngeal Material enters airway, remains ABOVE vocal cords and not ejected out;Material enters airway, CONTACTS cords and not ejected out Pharyngeal- Thin Straw Delayed swallow initiation-pyriform sinuses;Penetration/Aspiration before swallow;Penetration/Aspiration during swallow Pharyngeal Material enters airway, remains ABOVE vocal cords and not ejected out;Material enters airway, CONTACTS cords and not ejected out;Material enters airway, passes BELOW cords and not ejected out despite cough attempt by patient Pharyngeal- Puree Delayed swallow initiation-vallecula Pharyngeal- Regular WFL Pharyngeal- Pill Delayed swallow initiation-pyriform sinuses    10/05/2021   2:47 PM Cervical Esophageal Phase  Cervical Esophageal Phase [facility] Shanika I. Hardin Negus, [place], [place] Office number [phone] Pager [place] 10/05/2021, 3:36 PM                     DG Chest Port 1 View  Result Date: 10/04/2021 CLINICAL DATA:  Acute metabolic encephalopathy with hypoxic respiratory failure. EXAM: PORTABLE CHEST 1 VIEW COMPARISON:  10/03/2021 FINDINGS: The endotracheal tube terminates just above the carina. There is a nasogastric tube in place with tip and side port below the GE junction. Stable cardiomediastinal contours. Bilateral mid and lower lung zone increase opacification is identified which is suboptimally evaluated by portable technique. This is favored to represent progressive veil like opacification due to enlarging bilateral pleural effusions. Underlying airspace consolidation/atelectasis cannot be excluded. IMPRESSION: 1. Worsening aeration to both lung bases compatible which is favored to represent enlarging bilateral pleural effusions. Underlying airspace consolidation/atelectasis cannot be excluded. Electronically Signed   By: Kerby Moors M.D.   On: 10/04/2021 08:22   ECHOCARDIOGRAM  COMPLETE  Result Date: 10/03/2021    ECHOCARDIOGRAM REPORT   Patient Name:   NONNIE PICKNEY Date of Exam: 10/03/2021 Medical Rec #:  [id number]      Height:       61.0 in Accession #:    [id number]     Weight:       218.0 lb Date of Birth:  1945/04/06      BSA:          1.959 m Patient Age:    28 years       BP:           94/40 mmHg Patient Gender: F              HR:           50 bpm. Exam Location:  Inpatient Procedure: 2D Echo Indications:    acute respiratory distress  History:        Patient has prior history of Echocardiogram examinations, most                 recent 10/29/2017. Risk Factors:Diabetes and Hypertension.  Sonographer:    Johny Chess RDCS Referring Phys: [id number] Donita Brooks  Sonographer Comments: Image acquisition challenging due to patient body habitus. IMPRESSIONS  1. Left ventricular ejection fraction, by estimation, is 60 to 65%. The left ventricle has normal function. The left ventricle has no regional wall motion abnormalities. There is moderate concentric left ventricular hypertrophy. Left ventricular diastolic parameters are indeterminate. Elevated left atrial pressure.  2. Right ventricular systolic function is normal. The right ventricular size is normal. There is mildly elevated pulmonary artery systolic pressure.  3. Left atrial size was mildly dilated.  4. The mitral valve is normal in structure. Mild mitral valve regurgitation. No evidence of mitral stenosis.  5. Tricuspid valve regurgitation is mild to moderate.  6. The aortic valve is normal in structure. Aortic valve regurgitation is trivial. No aortic stenosis is present.  7. The inferior vena cava is dilated in size with <50% respiratory variability, suggesting right atrial pressure of 15 mmHg. FINDINGS  Left Ventricle: Left ventricular ejection fraction, by estimation, is 60 to 65%. The left ventricle has normal function. The left ventricle has no regional wall motion abnormalities. The left ventricular internal cavity size  was normal in size. There is  moderate concentric left ventricular hypertrophy. Left ventricular diastolic parameters are indeterminate. Elevated left atrial pressure. Right Ventricle: The right ventricular size is normal. No increase in right ventricular wall thickness. Right ventricular systolic function is normal. There is mildly elevated pulmonary artery systolic pressure. The tricuspid regurgitant velocity is 2.57  m/s, and with an assumed right atrial pressure of 15 mmHg, the estimated right ventricular systolic pressure is 48.2 mmHg. Left Atrium: Left atrial size was mildly dilated. Right Atrium: Right atrial size was normal in size. Pericardium: There is no evidence of pericardial effusion. Mitral Valve: The mitral valve is normal in structure. Mild mitral valve regurgitation. No evidence of mitral valve stenosis. Tricuspid Valve: The tricuspid valve is normal in structure. Tricuspid valve regurgitation is mild to moderate. No evidence of tricuspid stenosis. Aortic Valve: The aortic valve is normal in structure. Aortic valve regurgitation is trivial. Aortic regurgitation PHT measures 561 msec. No aortic stenosis is present. Pulmonic Valve: The pulmonic valve was normal in structure. Pulmonic valve regurgitation is trivial. No evidence of pulmonic stenosis. Aorta: The aortic root is normal in size and structure. Venous: The inferior vena cava is dilated in size with less than 50% respiratory variability, suggesting right atrial pressure of 15 mmHg. IAS/Shunts: No atrial level shunt detected by color flow Doppler.  LEFT VENTRICLE PLAX  2D LVIDd:         5.00 cm   Diastology LVIDs:         2.80 cm   LV e' medial:    3.59 cm/s LV PW:         1.40 cm   LV E/e' medial:  26.5 LV IVS:        1.30 cm   LV e' lateral:   6.09 cm/s LVOT diam:     1.60 cm   LV E/e' lateral: 15.6 LV SV:         80 LV SV Index:   41 LVOT Area:     2.01 cm  RIGHT VENTRICLE            IVC RV Basal diam:  2.70 cm    IVC diam: 2.40 cm RV S  prime:     8.70 cm/s TAPSE (M-mode): 2.2 cm LEFT ATRIUM             Index        RIGHT ATRIUM           Index LA diam:        4.00 cm 2.04 cm/m   RA Area:     12.50 cm LA Vol (A2C):   70.7 ml 36.08 ml/m  RA Volume:   28.80 ml  14.70 ml/m LA Vol (A4C):   70.5 ml 35.98 ml/m LA Biplane Vol: 73.0 ml 37.26 ml/m  AORTIC VALVE LVOT Vmax:   138.00 cm/s LVOT Vmean:  94.900 cm/s LVOT VTI:    0.398 m AI PHT:      561 msec  AORTA Ao Root diam: 2.70 cm Ao Asc diam:  3.00 cm MITRAL VALVE               TRICUSPID VALVE MV Area (PHT): 2.56 cm    TR Peak grad:   26.4 mmHg MV Decel Time: 296 msec    TR Vmax:        257.00 cm/s MV E velocity: 95.30 cm/s MV A velocity: 81.80 cm/s  SHUNTS MV E/A ratio:  1.17        Systemic VTI:  0.40 m                            Systemic Diam: 1.60 cm Kardie Tobb DO Electronically signed by Berniece Salines DO Signature Date/Time: 10/03/2021/4:46:13 PM    Final    CT HEAD WO CONTRAST  Result Date: 10/03/2021 CLINICAL DATA:  Found down.  Suspect trauma. EXAM: CT HEAD WITHOUT CONTRAST CT CERVICAL SPINE WITHOUT CONTRAST TECHNIQUE: Multidetector CT imaging of the head and cervical spine was performed following the standard protocol without intravenous contrast. Multiplanar CT image reconstructions of the cervical spine were also generated. RADIATION DOSE REDUCTION: This exam was performed according to the departmental dose-optimization program which includes automated exposure control, adjustment of the mA and/or kV according to patient size and/or use of iterative reconstruction technique. COMPARISON:  11/22/2020 FINDINGS: CT HEAD FINDINGS Brain: Exam detail diminished secondary to motion artifact and patient positioning. (Patient unable to follow commands.). Best imaging obtained at this time. No evidence of acute infarction, hemorrhage, hydrocephalus, extra-axial collection or mass lesion/mass effect. Prominence of the sulci and ventricles compatible with mild brain atrophy. Vascular: No hyperdense  vessel or unexpected calcification. Skull: Normal. Negative for fracture or focal lesion. Sinuses/Orbits: Retention cyst is identified in the left maxillary sinus. No acute abnormality noted. Other: None. CT  CERVICAL SPINE FINDINGS Exam detail diminished secondary to motion artifact and patient positioning. (Patient unable to follow commands.). Best imaging obtained at this time. Alignment: Normal. Skull base and vertebrae: No acute fracture. No primary bone lesion or focal pathologic process. Soft tissues and spinal canal: No prevertebral fluid or swelling. No visible canal hematoma. Disc levels:  Mild multilevel endplate spurring identified. Upper chest: Negative. Other: ET tube and NG tube are in place IMPRESSION: 1. Exam detail is diminished secondary to motion artifact and patient positioning. Best imaging obtained at this time. Recommend repeat imaging as clinically indicated and patient's clinical condition tolerates. 2. No evidence for acute intracranial abnormality. 3. No evidence for cervical spine fracture or subluxation. Electronically Signed   By: Kerby Moors M.D.   On: 10/03/2021 07:30   CT CERVICAL SPINE WO CONTRAST  Result Date: 10/03/2021 CLINICAL DATA:  Found down.  Suspect trauma. EXAM: CT HEAD WITHOUT CONTRAST CT CERVICAL SPINE WITHOUT CONTRAST TECHNIQUE: Multidetector CT imaging of the head and cervical spine was performed following the standard protocol without intravenous contrast. Multiplanar CT image reconstructions of the cervical spine were also generated. RADIATION DOSE REDUCTION: This exam was performed according to the departmental dose-optimization program which includes automated exposure control, adjustment of the mA and/or kV according to patient size and/or use of iterative reconstruction technique. COMPARISON:  11/22/2020 FINDINGS: CT HEAD FINDINGS Brain: Exam detail diminished secondary to motion artifact and patient positioning. (Patient unable to follow commands.). Best  imaging obtained at this time. No evidence of acute infarction, hemorrhage, hydrocephalus, extra-axial collection or mass lesion/mass effect. Prominence of the sulci and ventricles compatible with mild brain atrophy. Vascular: No hyperdense vessel or unexpected calcification. Skull: Normal. Negative for fracture or focal lesion. Sinuses/Orbits: Retention cyst is identified in the left maxillary sinus. No acute abnormality noted. Other: None. CT CERVICAL SPINE FINDINGS Exam detail diminished secondary to motion artifact and patient positioning. (Patient unable to follow commands.). Best imaging obtained at this time. Alignment: Normal. Skull base and vertebrae: No acute fracture. No primary bone lesion or focal pathologic process. Soft tissues and spinal canal: No prevertebral fluid or swelling. No visible canal hematoma. Disc levels:  Mild multilevel endplate spurring identified. Upper chest: Negative. Other: ET tube and NG tube are in place IMPRESSION: 1. Exam detail is diminished secondary to motion artifact and patient positioning. Best imaging obtained at this time. Recommend repeat imaging as clinically indicated and patient's clinical condition tolerates. 2. No evidence for acute intracranial abnormality. 3. No evidence for cervical spine fracture or subluxation. Electronically Signed   By: Kerby Moors M.D.   On: 10/03/2021 07:30   DG Chest Port 1 View  Result Date: 10/03/2021 CLINICAL DATA:  Status post intubation. EXAM: PORTABLE CHEST 1 VIEW COMPARISON:  Earlier today FINDINGS: The ET tube has been retracted and is now 1.9 cm above the carina. Stable cardiac enlargement. Unchanged bilateral pleural effusions and interstitial edema. IMPRESSION: Interval retraction of endotracheal tube with tip now 1.9 cm above the carina. Electronically Signed   By: Kerby Moors M.D.   On: 10/03/2021 07:02   DG Chest Port 1 View  Result Date: 10/03/2021 CLINICAL DATA:  Status post ET tube placement. EXAM: PORTABLE  CHEST 1 VIEW COMPARISON:  10/03/2021 FINDINGS: ETT tip in satisfactory position just above the carina. There is an enteric tube with tip and side port coursing below the level of the imaged diaphragms. Stable cardiac silhouette. Bilateral pleural effusions and mild interstitial edema identified, slightly improved from the prior  exam. No new findings. IMPRESSION: 1. Satisfactory position of ETT. 2. Slight improvement in pleural effusions and interstitial edema. Electronically Signed   By: Kerby Moors M.D.   On: 10/03/2021 07:02   DG Pelvis Portable  Result Date: 10/03/2021 CLINICAL DATA:  76 year old female with altered level of consciousness. EXAM: PORTABLE PELVIS 1-2 VIEWS COMPARISON:  CT Abdomen and Pelvis 09/22/2017. FINDINGS: Portable AP supine view at 0545 hours. Large body habitus, suboptimal radiographic technique. Femoral heads appear to be normally located. No pelvis fracture or dislocation is evident. Negative visible bowel gas pattern. IMPRESSION: Suboptimal due to body habitus/technique. No radiographic abnormality identified. Electronically Signed   By: Genevie Ann M.D.   On: 10/03/2021 06:07   DG Chest Port 1 View  Result Date: 10/03/2021 CLINICAL DATA:  76 year old female with altered level of consciousness. EXAM: PORTABLE CHEST 1 VIEW COMPARISON:  Chest radiographs 12/13/2020 and earlier. FINDINGS: Portable AP semi upright view at 0541 hours. Lower lung volumes. Increased perihilar and basilar predominant pulmonary interstitial markings and failing bibasilar lung opacity obscuring the diaphragm. No superimposed pneumothorax. No air bronchograms identified. Mediastinal contours appear stable, within normal limits. No acute osseous abnormality identified. Paucity of bowel gas in the upper abdomen. IMPRESSION: Lower lung volumes with evidence of acute pulmonary edema and small pleural effusions. Electronically Signed   By: Genevie Ann M.D.   On: 10/03/2021 06:06     Discharge Exam: Vitals:    10/09/21 0835 10/09/21 1109  BP: 115/89 134/78  Pulse: 70 67  Resp: 12 17  Temp: 97.8 F (36.6 C) 98 F (36.7 C)  SpO2: 93% 98%   Vitals:   10/09/21 0335 10/09/21 0555 10/09/21 0835 10/09/21 1109  BP: (!) 135/45  115/89 134/78  Pulse: (!) 59  70 67  Resp: 14  12 17   Temp: 98 F (36.7 C)  97.8 F (36.6 C) 98 F (36.7 C)  TempSrc: Oral  Oral Oral  SpO2: 97%  93% 98%  Weight:  105.1 kg    Height:        General: Pt is alert, awake, not in acute distress Cardiovascular: RRR, S1/S2 +, no rubs, no gallops Respiratory: CTA bilaterally, no wheezing, no rhonchi Abdominal: Soft, NT, ND, bowel sounds + Extremities: no edema, no cyanosis    The results of significant diagnostics from this hospitalization (including imaging, microbiology, ancillary and laboratory) are listed below for reference.     Microbiology: Recent Results (from the past 240 hour(s))  Blood Culture (Routine X 2)     Status: Abnormal   Collection Time: 10/03/21  5:33 AM   Specimen: BLOOD  Result Value Ref Range Status   Specimen Description BLOOD SITE NOT SPECIFIED  Final   Special Requests   Final    BOTTLES DRAWN AEROBIC AND ANAEROBIC Blood Culture adequate volume   Culture  Setup Time   Final    GRAM POSITIVE COCCI AEROBIC BOTTLE ONLY CRITICAL RESULT CALLED TO, READ BACK BY AND VERIFIED WITH: T RUDISILL,PHARMD@0359  10/04/21 mk Performed at [facility], [address] [place], [place] [address]    Culture STAPHYLOCOCCUS LUGDUNENSIS (A)  Final   Report Status 10/06/2021 FINAL  Final   Organism ID, Bacteria STAPHYLOCOCCUS LUGDUNENSIS  Final      Susceptibility   Staphylococcus lugdunensis - MIC*    CIPROFLOXACIN 4 RESISTANT Resistant     ERYTHROMYCIN >=8 RESISTANT Resistant     GENTAMICIN <=0.5 SENSITIVE Sensitive     OXACILLIN <=0.25 SENSITIVE Sensitive  TETRACYCLINE >=16 RESISTANT Resistant     VANCOMYCIN 1 SENSITIVE Sensitive     TRIMETH/SULFA <=10 SENSITIVE Sensitive     CLINDAMYCIN  RESISTANT Resistant     RIFAMPIN <=0.5 SENSITIVE Sensitive     Inducible Clindamycin POSITIVE Resistant     * STAPHYLOCOCCUS LUGDUNENSIS  Blood Culture (Routine X 2)     Status: None   Collection Time: 10/03/21  5:33 AM   Specimen: BLOOD  Result Value Ref Range Status   Specimen Description BLOOD SITE NOT SPECIFIED  Final   Special Requests   Final    BOTTLES DRAWN AEROBIC AND ANAEROBIC Blood Culture results may not be optimal due to an excessive volume of blood received in culture bottles   Culture   Final    NO GROWTH 5 DAYS Performed at [facility], [place] [address], [place], [place] [address]    Report Status 10/08/2021 FINAL  Final  Blood Culture ID Panel (Reflexed)     Status: Abnormal   Collection Time: 10/03/21  5:33 AM  Result Value Ref Range Status   Enterococcus faecalis NOT DETECTED NOT DETECTED Final   Enterococcus Faecium NOT DETECTED NOT DETECTED Final   Listeria monocytogenes NOT DETECTED NOT DETECTED Final   Staphylococcus species DETECTED (A) NOT DETECTED Final    Comment: CRITICAL RESULT CALLED TO, READ BACK BY AND VERIFIED WITH: T RUDISILL,PHARMD@0400  10/04/21 [place]    Staphylococcus aureus (BCID) NOT DETECTED NOT DETECTED Final   Staphylococcus epidermidis NOT DETECTED NOT DETECTED Final   Staphylococcus lugdunensis DETECTED (A) NOT DETECTED Final    Comment: CRITICAL RESULT CALLED TO, READ BACK BY AND VERIFIED WITH: T RUDISILL,PHARMD@0400  10/04/21 [place]    Streptococcus species NOT DETECTED NOT DETECTED Final   Streptococcus agalactiae NOT DETECTED NOT DETECTED Final   Streptococcus pneumoniae NOT DETECTED NOT DETECTED Final   Streptococcus pyogenes NOT DETECTED NOT DETECTED Final   A.calcoaceticus-baumannii NOT DETECTED NOT DETECTED Final   Bacteroides fragilis NOT DETECTED NOT DETECTED Final   Enterobacterales NOT DETECTED NOT DETECTED Final   Enterobacter cloacae complex NOT DETECTED NOT DETECTED Final   Escherichia coli NOT DETECTED NOT DETECTED Final    Klebsiella aerogenes NOT DETECTED NOT DETECTED Final   Klebsiella oxytoca NOT DETECTED NOT DETECTED Final   Klebsiella pneumoniae NOT DETECTED NOT DETECTED Final   Proteus species NOT DETECTED NOT DETECTED Final   Salmonella species NOT DETECTED NOT DETECTED Final   Serratia marcescens NOT DETECTED NOT DETECTED Final   Haemophilus influenzae NOT DETECTED NOT DETECTED Final   Neisseria meningitidis NOT DETECTED NOT DETECTED Final   Pseudomonas aeruginosa NOT DETECTED NOT DETECTED Final   Stenotrophomonas maltophilia NOT DETECTED NOT DETECTED Final   Candida albicans NOT DETECTED NOT DETECTED Final   Candida auris NOT DETECTED NOT DETECTED Final   Candida glabrata NOT DETECTED NOT DETECTED Final   Candida krusei NOT DETECTED NOT DETECTED Final   Candida parapsilosis NOT DETECTED NOT DETECTED Final   Candida tropicalis NOT DETECTED NOT DETECTED Final   Cryptococcus neoformans/gattii NOT DETECTED NOT DETECTED Final   Methicillin resistance mecA/C NOT DETECTED NOT DETECTED Final    Comment: Performed at [facility], [address], [place], [place] [address]  Culture, blood (Routine X 2) w Reflex to ID Panel     Status: None   Collection Time: 10/03/21 10:18 AM   Specimen: BLOOD LEFT HAND  Result Value Ref Range Status   Specimen Description BLOOD LEFT HAND  Final   Special Requests   Final  BOTTLES DRAWN AEROBIC ONLY Blood Culture adequate volume   Culture   Final    NO GROWTH 5 DAYS Performed at [facility], [place] [address] [place], [place] [address]    Report Status 10/08/2021 FINAL  Final  Culture, blood (Routine X 2) w Reflex to ID Panel     Status: None   Collection Time: 10/03/21 10:38 AM   Specimen: BLOOD LEFT ARM  Result Value Ref Range Status   Specimen Description BLOOD LEFT ARM  Final   Special Requests   Final    BOTTLES DRAWN AEROBIC ONLY Blood Culture results may not be optimal due to an inadequate volume of blood received in culture bottles    Culture   Final    NO GROWTH 5 DAYS Performed at [facility], [place] [address], [place], [place] [address]    Report Status 10/08/2021 FINAL  Final  MRSA Next Gen by PCR, Nasal     Status: None   Collection Time: 10/03/21 10:51 AM   Specimen: Urine, Catheterized; Nasal Swab  Result Value Ref Range Status   MRSA by PCR Next Gen NOT DETECTED NOT DETECTED Final    Comment: (NOTE) The GeneXpert MRSA Assay (FDA approved for NASAL specimens only), is one component of a comprehensive MRSA colonization surveillance program. It is not intended to diagnose MRSA infection nor to guide or monitor treatment for MRSA infections. Test performance is not FDA approved in patients less than 91 years old. Performed at [facility], [place] [address], [place], [place] [address]   Culture, blood (Routine X 2) w Reflex to ID Panel     Status: None (Preliminary result)   Collection Time: 10/05/21  3:12 AM   Specimen: BLOOD  Result Value Ref Range Status   Specimen Description BLOOD LEFT ANTECUBITAL  Final   Special Requests   Final    BOTTLES DRAWN AEROBIC AND ANAEROBIC Blood Culture adequate volume   Culture   Final    NO GROWTH 4 DAYS Performed at [facility], [place] [address], [place], [place] [address]    Report Status PENDING  Incomplete  Culture, blood (Routine X 2) w Reflex to ID Panel     Status: None (Preliminary result)   Collection Time: 10/05/21  3:14 AM   Specimen: BLOOD RIGHT HAND  Result Value Ref Range Status   Specimen Description BLOOD RIGHT HAND  Final   Special Requests   Final    BOTTLES DRAWN AEROBIC AND ANAEROBIC Blood Culture adequate volume   Culture   Final    NO GROWTH 4 DAYS Performed at [facility], [place] [address] [place], [place] [address]    Report Status PENDING  Incomplete     Labs: BNP (last 3 results) Recent Labs    10/03/21 1018  BNP 678.9*   Basic Metabolic Panel: Recent Labs  Lab 10/03/21 1018 10/03/21 1459 10/04/21 0416  10/04/21 1532 10/05/21 0312 10/06/21 0149 10/07/21 0124 10/08/21 0218 10/09/21 0042  NA  --   --  145  --  143 143 142 139 141  K  --   --  4.5  --  3.9 4.1 4.6 4.4 4.2  CL  --   --  117*  --  111 115* 110 107 109  CO2  --   --  21*  --  22 24 22 23 23   GLUCOSE  --   --  210*  --  69* 118* 161*  146* 122*  BUN  --   --  24*  --  27* 30* 36* 39* 42*  CREATININE  --   --  1.65*  --  1.78* 1.70* 1.86* 1.95* 1.84*  CALCIUM  --   --  7.9*  --  8.0* 8.0* 7.9* 8.0* 8.3*  MG 1.5* 1.6* 2.1 2.0  --   --   --   --   --   PHOS 2.7 2.7 3.0 3.1  --   --   --   --   --    Liver Function Tests: Recent Labs  Lab 10/03/21 0531  AST 40  ALT 20  ALKPHOS 70  BILITOT 1.7*  PROT 5.3*  ALBUMIN 2.7*   No results for input(s): "LIPASE", "AMYLASE" in the last 168 hours. No results for input(s): "AMMONIA" in the last 168 hours. CBC: Recent Labs  Lab 10/03/21 0531 10/03/21 0611 10/03/21 0903 10/04/21 0416 10/05/21 0312 10/06/21 0149  WBC 6.8  --   --  13.0* 9.6 6.9  NEUTROABS 4.7  --   --   --   --   --   HGB 10.9* 10.2*  9.9* 8.8* 9.8* 8.9* 9.1*  HCT 32.0* 30.0*  29.0* 26.0* 30.3* 28.3* 27.3*  MCV 95.2  --   --  98.4 98.3 95.5  PLT 159  --   --  153 123* 116*   Cardiac Enzymes: No results for input(s): "CKTOTAL", "CKMB", "CKMBINDEX", "TROPONINI" in the last 168 hours. BNP: Invalid input(s): "POCBNP" CBG: Recent Labs  Lab 10/08/21 1118 10/08/21 1615 10/08/21 2113 10/09/21 0620 10/09/21 1108  GLUCAP 186* 150* 140* 109* 171*   D-Dimer No results for input(s): "DDIMER" in the last 72 hours. Hgb A1c No results for input(s): "HGBA1C" in the last 72 hours. Lipid Profile No results for input(s): "CHOL", "HDL", "LDLCALC", "TRIG", "CHOLHDL", "LDLDIRECT" in the last 72 hours. Thyroid function studies No results for input(s): "TSH", "T4TOTAL", "T3FREE", "THYROIDAB" in the last 72 hours.  Invalid input(s): "FREET3" Anemia work up No results for input(s): "VITAMINB12", "FOLATE",  "FERRITIN", "TIBC", "IRON", "RETICCTPCT" in the last 72 hours. Urinalysis    Component Value Date/Time   COLORURINE YELLOW 10/03/2021 1029   APPEARANCEUR CLEAR 10/03/2021 1029   LABSPEC 1.008 10/03/2021 1029   PHURINE 5.0 10/03/2021 1029   GLUCOSEU NEGATIVE 10/03/2021 1029   HGBUR MODERATE (A) 10/03/2021 1029   BILIRUBINUR NEGATIVE 10/03/2021 1029   KETONESUR NEGATIVE 10/03/2021 1029   PROTEINUR 100 (A) 10/03/2021 1029   NITRITE NEGATIVE 10/03/2021 1029   LEUKOCYTESUR NEGATIVE 10/03/2021 1029   Sepsis Labs Recent Labs  Lab 10/03/21 0531 10/04/21 0416 10/05/21 0312 10/06/21 0149  WBC 6.8 13.0* 9.6 6.9   Microbiology Recent Results (from the past 240 hour(s))  Blood Culture (Routine X 2)     Status: Abnormal   Collection Time: 10/03/21  5:33 AM   Specimen: BLOOD  Result Value Ref Range Status   Specimen Description BLOOD SITE NOT SPECIFIED  Final   Special Requests   Final    BOTTLES DRAWN AEROBIC AND ANAEROBIC Blood Culture adequate volume   Culture  Setup Time   Final    GRAM POSITIVE COCCI AEROBIC BOTTLE ONLY CRITICAL RESULT CALLED TO, READ BACK BY AND VERIFIED WITH: T RUDISILL,PHARMD@0359  10/04/21 mk Performed at [facility], [place] [address], [place], [place] [address]    Culture STAPHYLOCOCCUS LUGDUNENSIS (A)  Final   Report Status 10/06/2021 FINAL  Final   Organism ID, Bacteria STAPHYLOCOCCUS LUGDUNENSIS  Final  Susceptibility   Staphylococcus lugdunensis - MIC*    CIPROFLOXACIN 4 RESISTANT Resistant     ERYTHROMYCIN >=8 RESISTANT Resistant     GENTAMICIN <=0.5 SENSITIVE Sensitive     OXACILLIN <=0.25 SENSITIVE Sensitive     TETRACYCLINE >=16 RESISTANT Resistant     VANCOMYCIN 1 SENSITIVE Sensitive     TRIMETH/SULFA <=10 SENSITIVE Sensitive     CLINDAMYCIN RESISTANT Resistant     RIFAMPIN <=0.5 SENSITIVE Sensitive     Inducible Clindamycin POSITIVE Resistant     * STAPHYLOCOCCUS LUGDUNENSIS  Blood Culture (Routine X 2)     Status: None    Collection Time: 10/03/21  5:33 AM   Specimen: BLOOD  Result Value Ref Range Status   Specimen Description BLOOD SITE NOT SPECIFIED  Final   Special Requests   Final    BOTTLES DRAWN AEROBIC AND ANAEROBIC Blood Culture results may not be optimal due to an excessive volume of blood received in culture bottles   Culture   Final    NO GROWTH 5 DAYS Performed at [facility], [place] [address] [place], [place] [address]    Report Status 10/08/2021 FINAL  Final  Blood Culture ID Panel (Reflexed)     Status: Abnormal   Collection Time: 10/03/21  5:33 AM  Result Value Ref Range Status   Enterococcus faecalis NOT DETECTED NOT DETECTED Final   Enterococcus Faecium NOT DETECTED NOT DETECTED Final   Listeria monocytogenes NOT DETECTED NOT DETECTED Final   Staphylococcus species DETECTED (A) NOT DETECTED Final    Comment: CRITICAL RESULT CALLED TO, READ BACK BY AND VERIFIED WITH: T RUDISILL,PHARMD@0400  10/04/21 [place]    Staphylococcus aureus (BCID) NOT DETECTED NOT DETECTED Final   Staphylococcus epidermidis NOT DETECTED NOT DETECTED Final   Staphylococcus lugdunensis DETECTED (A) NOT DETECTED Final    Comment: CRITICAL RESULT CALLED TO, READ BACK BY AND VERIFIED WITH: T RUDISILL,PHARMD@0400  10/04/21 [place]    Streptococcus species NOT DETECTED NOT DETECTED Final   Streptococcus agalactiae NOT DETECTED NOT DETECTED Final   Streptococcus pneumoniae NOT DETECTED NOT DETECTED Final   Streptococcus pyogenes NOT DETECTED NOT DETECTED Final   A.calcoaceticus-baumannii NOT DETECTED NOT DETECTED Final   Bacteroides fragilis NOT DETECTED NOT DETECTED Final   Enterobacterales NOT DETECTED NOT DETECTED Final   Enterobacter cloacae complex NOT DETECTED NOT DETECTED Final   Escherichia coli NOT DETECTED NOT DETECTED Final   Klebsiella aerogenes NOT DETECTED NOT DETECTED Final   Klebsiella oxytoca NOT DETECTED NOT DETECTED Final   Klebsiella pneumoniae NOT DETECTED NOT DETECTED Final   Proteus species NOT  DETECTED NOT DETECTED Final   Salmonella species NOT DETECTED NOT DETECTED Final   Serratia marcescens NOT DETECTED NOT DETECTED Final   Haemophilus influenzae NOT DETECTED NOT DETECTED Final   Neisseria meningitidis NOT DETECTED NOT DETECTED Final   Pseudomonas aeruginosa NOT DETECTED NOT DETECTED Final   Stenotrophomonas maltophilia NOT DETECTED NOT DETECTED Final   Candida albicans NOT DETECTED NOT DETECTED Final   Candida auris NOT DETECTED NOT DETECTED Final   Candida glabrata NOT DETECTED NOT DETECTED Final   Candida krusei NOT DETECTED NOT DETECTED Final   Candida parapsilosis NOT DETECTED NOT DETECTED Final   Candida tropicalis NOT DETECTED NOT DETECTED Final   Cryptococcus neoformans/gattii NOT DETECTED NOT DETECTED Final   Methicillin resistance mecA/C NOT DETECTED NOT DETECTED Final    Comment: Performed at [facility], [address] [place], [place] [address]  Culture, blood (Routine X 2) w Reflex to ID  Panel     Status: None   Collection Time: 10/03/21 10:18 AM   Specimen: BLOOD LEFT HAND  Result Value Ref Range Status   Specimen Description BLOOD LEFT HAND  Final   Special Requests   Final    BOTTLES DRAWN AEROBIC ONLY Blood Culture adequate volume   Culture   Final    NO GROWTH 5 DAYS Performed at [facility], [address], [place], [place] [address]    Report Status 10/08/2021 FINAL  Final  Culture, blood (Routine X 2) w Reflex to ID Panel     Status: None   Collection Time: 10/03/21 10:38 AM   Specimen: BLOOD LEFT ARM  Result Value Ref Range Status   Specimen Description BLOOD LEFT ARM  Final   Special Requests   Final    BOTTLES DRAWN AEROBIC ONLY Blood Culture results may not be optimal due to an inadequate volume of blood received in culture bottles   Culture   Final    NO GROWTH 5 DAYS Performed at [facility], [place] [address] [place], [place] [address]    Report Status 10/08/2021 FINAL  Final  MRSA Next Gen by PCR, Nasal      Status: None   Collection Time: 10/03/21 10:51 AM   Specimen: Urine, Catheterized; Nasal Swab  Result Value Ref Range Status   MRSA by PCR Next Gen NOT DETECTED NOT DETECTED Final    Comment: (NOTE) The GeneXpert MRSA Assay (FDA approved for NASAL specimens only), is one component of a comprehensive MRSA colonization surveillance program. It is not intended to diagnose MRSA infection nor to guide or monitor treatment for MRSA infections. Test performance is not FDA approved in patients less than 65 years old. Performed at [facility], [place] [address], [place], [place] [address]   Culture, blood (Routine X 2) w Reflex to ID Panel     Status: None (Preliminary result)   Collection Time: 10/05/21  3:12 AM   Specimen: BLOOD  Result Value Ref Range Status   Specimen Description BLOOD LEFT ANTECUBITAL  Final   Special Requests   Final    BOTTLES DRAWN AEROBIC AND ANAEROBIC Blood Culture adequate volume   Culture   Final    NO GROWTH 4 DAYS Performed at [facility], [place] [address], [place], [place] [address]    Report Status PENDING  Incomplete  Culture, blood (Routine X 2) w Reflex to ID Panel     Status: None (Preliminary result)   Collection Time: 10/05/21  3:14 AM   Specimen: BLOOD RIGHT HAND  Result Value Ref Range Status   Specimen Description BLOOD RIGHT HAND  Final   Special Requests   Final    BOTTLES DRAWN AEROBIC AND ANAEROBIC Blood Culture adequate volume   Culture   Final    NO GROWTH 4 DAYS Performed at [facility], [place] [address] [place], [place] [address]    Report Status PENDING  Incomplete     Time coordinating discharge: Over 30 minutes  SIGNED:   Darliss Cheney, MD  [facility] 10/09/2021, 1:14 PM *Please note that this is a verbal dictation therefore any spelling or grammatical errors are due to the "[place] One" system interpretation. If 7PM-7AM, please contact night-coverage [email or web address]

## 2021-10-09 NOTE — Progress Notes (Signed)
Pt wheeled off by EMS.

## 2021-10-09 NOTE — TOC Progression Note (Signed)
Transition of Care [facility]) - Progression Note    Patient Details  Name: Elizabeth Crosby MRN: [id number] Date of Birth: 02/01/1946  [facility] CM/SW Contact  Joanne Chars, LCSW Phone Number: 10/09/2021, 8:33 AM  Clinical Narrative:   Auth approved in [place]: [id number], [id number], 3 days: 7/11-7/13.      Expected Discharge Plan: [place] Barriers to Discharge: Continued Medical Work up, Ship broker, SNF Pending bed offer  Expected Discharge Plan and Services Expected Discharge Plan: [place] In-house Referral: Clinical Social Work   Post Acute Care Choice: [place] Living arrangements for the past 2 months: Single Family Home                                       Social Determinants of Health (SDOH) Interventions    Readmission Risk Interventions     No data to display

## 2021-10-09 NOTE — Progress Notes (Signed)
Gave report to whitney at [place].

## 2021-10-09 NOTE — TOC Progression Note (Signed)
Transition of Care [facility]) - Progression Note    Patient Details  Name: TRACIE DORE MRN: [id number] Date of Birth: 10-04-1945  Transition of Care [facility]) CM/SW Contact  Joanne Chars, LCSW Phone Number: 10/09/2021, 10:54 AM  Clinical Narrative:  CSW informed by MD that pt now saying she wants to go home per husband's wishes.  CSW spoke with daughter [name] by phone, updated her, husband is amputee, not able to offer a lot of help at home, she is in agreement that this is not good plan and will call pt husband (her stepfather) and discuss need for SNF.  CSw received call back several minutes later that she had spoken to pt husband and he is in agreement to move forward with DC to SNF.    Expected Discharge Plan: [place] Barriers to Discharge: Continued Medical Work up, Ship broker, SNF Pending bed offer  Expected Discharge Plan and Services Expected Discharge Plan: [place] In-house Referral: Clinical Social Work   Post Acute Care Choice: [place] Living arrangements for the past 2 months: Single Family Home                                       Social Determinants of Health (SDOH) Interventions    Readmission Risk Interventions     No data to display

## 2021-10-09 NOTE — Consult Note (Signed)
[facility] for Infectious Disease    Date of Admission:  10/03/2021   Total days of inpatient antibiotics: 6        Reason for Consult: Strep Lugdunensis bacteremia    Principal Problem:   Acute metabolic encephalopathy Active Problems:   Morbid obesity ([place])   DM2 (diabetes mellitus, type 2) ([place])   Delirium   Respiratory distress   Bacteremia   Acute renal failure superimposed on stage 3a chronic kidney disease ([place])   Accident due to mechanical fall without injury   Assessment: 75 YF admitted from home for acute encephalopathy and respiratory distress, required intubation(no extubated). Found to have staph lugdunensis bacteremia(POA) and aspiration PNA.  #Acute encephalopathy in the setting of bacteriemia #Staph lugdunensis bacteremia 2/2 unclear source  #Hx of falls #Poor dentition #Aspiration PNA #DM -Blood cultures on admission 7/4 1/2staph lugdunensis  -Repeat Cx on 7/5 with no growth -TTE on 7/5 valvular vegetation Imaging/wu -Chest x-ray on admission showed evidence of acute pulmonary edema/small pulmonary edema -Chest x-ray 7/1 shows worsening aeration of both lung bases favored to be enlarging bilateral pleural effusion.  Underlying airspace consolidation/listhesis cannot be excluded - CT head on 7/5 showed no acute abnormality -CT C-spine on 7/5 showed no evidence of C-spine abnormality although it was motion degraded -Speech eval 7/10 showed pt able to self feed Source of bacteremia: could be 2/2 aspiration event vs falls. Pt has hx of multiple falls, she had a fall prior to admission. Given staph species; more likely falls is the source of bacteremia. Suspect bacteremia led to falls->encephalopathy/aspiration.  Will check orthopantogram in the setting of bacteremia Recommendations:  -Continue cefazolin -2 weeks from negative Cx -Orthopantogram OPAT ORDERS:  Diagnosis: staph lugdunenisis bacteremia  Culture Result: +Blood Cx  Allergies   Allergen Reactions   Ozempic (0.25 Or 0.5 Mg-Dose) [Semaglutide(0.25 Or 0.15m-Dos)] Nausea And Vomiting   Amoxil [Amoxicillin] Other (See Comments)    Cellulitis    Glucophage [Metformin] Diarrhea   Invokana [Canagliflozin] Other (See Comments)    UTI   Lipitor [Atorvastatin] Other (See Comments)    Myalgias   Lyrica Cr [Pregabalin Er] Other (See Comments)    Dizziness    Other Nausea And Vomiting    Pt states does not know drug but anesthesia drugs have made her have nausea and vomiting.    Sulfa Antibiotics Diarrhea   Trulicity [Dulaglutide] Other (See Comments)    GI Upset   Potassium-Containing Compounds Rash     Discharge antibiotics to be given via PICC line:  Per pharmacy protocol cefazolin 2gm tid Aim for Vancomycin trough 15-20 or AUC 400-550 (unless otherwise indicated)   [facility]: [facility] Use: Home health RN for IV administration and teaching, line care and labs.    Labs weekly while on IV antibiotics: __ CBC with differential __ CMP __ CRP __ ESR  __ Please pull PIC at completion of IV antibiotics   Fax weekly labs to ([phone] Clinic Follow Up Appt: 7/21  @ [facility] with Dr. SCandiss Norse Microbiology:   Antibiotics: Azithromycin 7/05-06 Ceftriaxone 7/5-6 Cefazolin 7/07-p Vancomycin 7/05 Cultures: Blood 7/04 1/2 Staph lugdunensis 7/5 NG 7/6 NG    HPI: Elizabeth FRANDSENis a 76y.o. female with history of CKD 3, diabetes, anxiety, glaucoma, gout, anxiety, hypertension, hyperlipidemia admitted with altered mental status, and respiratory distress. Patient got up in the middle of the night to the restroom  and husband heard a fall. He  reported that she was acting drunk . She is on tramadol and tizanidine and may have taken too much by accident.  Intubated for airway protection.  UDS negative, chest x-ray showed pulmonary edema/effusion.  Found to have staph lugdunensis bacteremia, pt on cefazolin, I D  engaged.    Review of Systems: ROS  Past Medical History:  Diagnosis Date   Anxiety    Arthritis    "knees are bone on bone and i see dr. Tonita Cong for the cortisone injections "    Cellulitis    hx  of in the legs    Chronic kidney disease (CKD), stage III (moderate) (HCC)    Complication of anesthesia    doesnt know the name of the anesthesia , but i was vomiting while i was still under    Diabetes mellitus without complication (HCC)    type 2    GERD (gastroesophageal reflux disease)    Glaucoma    Gout    History of blood transfusion    as child after accident, after childbirth/hemorroid surgery   History of kidney stones    History of radiation therapy    vaginal brachytherapy  11/27/2017-12/25/2017   Dr Gery Pray   Hyperlipidemia    Hypertension    Leukocytes in urine    Lumbar back pain    Sleep apnea    non adherant with cpap device     Social History   Tobacco Use   Smoking status: Never   Smokeless tobacco: Never  Vaping Use   Vaping Use: Never used  Substance Use Topics   Alcohol use: No   Drug use: No    Family History  Problem Relation Age of Onset   Stroke Mother        Cardiac condition   Asthma Father    Colon cancer Father    Scheduled Meds:  aspirin EC  81 mg Oral Daily   famotidine  20 mg Oral Daily   feeding supplement  237 mL Oral BID BM   fenofibrate  160 mg Oral q morning   furosemide  20 mg Oral BID   heparin  5,000 Units Subcutaneous Q8H   insulin aspart  0-15 Units Subcutaneous TID WC   insulin aspart  0-5 Units Subcutaneous QHS   lipase/protease/amylase  72,000 Units Oral BID WC   metoprolol succinate  25 mg Oral Daily   mouth rinse  15 mL Mouth Rinse 4 times per day   pantoprazole  40 mg Oral QHS   simvastatin  20 mg Oral QPM   Continuous Infusions:  sodium chloride     sodium chloride Stopped (10/03/21 1557)   sodium chloride 75 mL/hr at 10/09/21 0419    ceFAZolin (ANCEF) IV 2 g (10/08/21 2134)   PRN Meds:.sodium  chloride, docusate sodium, lipase/protease/amylase, ondansetron (ZOFRAN) IV, mouth rinse, polyethylene glycol Allergies  Allergen Reactions   Ozempic (0.25 Or 0.5 Mg-Dose) [Semaglutide(0.25 Or 0.29m-Dos)] Nausea And Vomiting   Amoxil [Amoxicillin] Other (See Comments)    Cellulitis    Glucophage [Metformin] Diarrhea   Invokana [Canagliflozin] Other (See Comments)    UTI   Lipitor [Atorvastatin] Other (See Comments)    Myalgias   Lyrica Cr [Pregabalin Er] Other (See Comments)    Dizziness    Other Nausea And Vomiting    Pt states does not know drug but anesthesia drugs have made her have nausea and vomiting.    Sulfa Antibiotics Diarrhea   Trulicity [  Dulaglutide] Other (See Comments)    GI Upset   Potassium-Containing Compounds Rash    OBJECTIVE: Blood pressure 115/89, pulse 70, temperature 97.8 F (36.6 C), temperature source Oral, resp. rate 12, height 5' 1" (1.549 m), weight 105.1 kg, SpO2 93 %.  Physical Exam Constitutional:      Appearance: Normal appearance.  HENT:     Head: Normocephalic and atraumatic.     Right Ear: Tympanic membrane normal.     Left Ear: Tympanic membrane normal.     Nose: Nose normal.     Mouth/Throat:     Mouth: Mucous membranes are moist.  Eyes:     Extraocular Movements: Extraocular movements intact.     Conjunctiva/sclera: Conjunctivae normal.     Pupils: Pupils are equal, round, and reactive to light.  Cardiovascular:     Rate and Rhythm: Normal rate and regular rhythm.     Heart sounds: No murmur heard.    No friction rub. No gallop.  Pulmonary:     Effort: Pulmonary effort is normal.     Breath sounds: Normal breath sounds.  Abdominal:     General: Abdomen is flat.     Palpations: Abdomen is soft.  Musculoskeletal:        General: Normal range of motion.  Skin:    General: Skin is warm and dry.  Neurological:     General: No focal deficit present.     Mental Status: She is alert and oriented to person, place, and time.   Psychiatric:        Mood and Affect: Mood normal.     Lab Results Lab Results  Component Value Date   WBC 6.9 10/06/2021   HGB 9.1 (L) 10/06/2021   HCT 27.3 (L) 10/06/2021   MCV 95.5 10/06/2021   PLT 116 (L) 10/06/2021    Lab Results  Component Value Date   CREATININE 1.84 (H) 10/09/2021   BUN 42 (H) 10/09/2021   NA 141 10/09/2021   K 4.2 10/09/2021   CL 109 10/09/2021   CO2 23 10/09/2021    Lab Results  Component Value Date   ALT 20 10/03/2021   AST 40 10/03/2021   ALKPHOS 70 10/03/2021   BILITOT 1.7 (H) 10/03/2021       Laurice Record, MD [facility]ious [facility] 10/09/2021, 9:20 AM

## 2021-10-10 DIAGNOSIS — Z923 Personal history of irradiation: Secondary | ICD-10-CM | POA: Diagnosis not present

## 2021-10-10 DIAGNOSIS — E119 Type 2 diabetes mellitus without complications: Secondary | ICD-10-CM | POA: Diagnosis not present

## 2021-10-10 DIAGNOSIS — G9341 Metabolic encephalopathy: Secondary | ICD-10-CM | POA: Diagnosis not present

## 2021-10-10 DIAGNOSIS — E559 Vitamin D deficiency, unspecified: Secondary | ICD-10-CM | POA: Diagnosis not present

## 2021-10-10 DIAGNOSIS — E032 Hypothyroidism due to medicaments and other exogenous substances: Secondary | ICD-10-CM | POA: Diagnosis not present

## 2021-10-10 DIAGNOSIS — Z79899 Other long term (current) drug therapy: Secondary | ICD-10-CM | POA: Diagnosis not present

## 2021-10-10 LAB — CULTURE, BLOOD (ROUTINE X 2)
Culture: NO GROWTH
Culture: NO GROWTH
Special Requests: ADEQUATE
Special Requests: ADEQUATE

## 2021-10-11 DIAGNOSIS — J690 Pneumonitis due to inhalation of food and vomit: Secondary | ICD-10-CM | POA: Diagnosis not present

## 2021-10-11 DIAGNOSIS — R0603 Acute respiratory distress: Secondary | ICD-10-CM | POA: Diagnosis not present

## 2021-10-11 DIAGNOSIS — G9341 Metabolic encephalopathy: Secondary | ICD-10-CM | POA: Diagnosis not present

## 2021-10-11 DIAGNOSIS — N179 Acute kidney failure, unspecified: Secondary | ICD-10-CM | POA: Diagnosis not present

## 2021-10-15 DIAGNOSIS — Z79899 Other long term (current) drug therapy: Secondary | ICD-10-CM | POA: Diagnosis not present

## 2021-10-15 DIAGNOSIS — G9341 Metabolic encephalopathy: Secondary | ICD-10-CM | POA: Diagnosis not present

## 2021-10-15 DIAGNOSIS — R7881 Bacteremia: Secondary | ICD-10-CM | POA: Diagnosis not present

## 2021-10-15 DIAGNOSIS — J690 Pneumonitis due to inhalation of food and vomit: Secondary | ICD-10-CM | POA: Diagnosis not present

## 2021-10-18 DIAGNOSIS — R52 Pain, unspecified: Secondary | ICD-10-CM | POA: Diagnosis not present

## 2021-10-18 DIAGNOSIS — G8929 Other chronic pain: Secondary | ICD-10-CM | POA: Diagnosis not present

## 2021-10-18 DIAGNOSIS — I10 Essential (primary) hypertension: Secondary | ICD-10-CM | POA: Diagnosis not present

## 2021-10-18 DIAGNOSIS — K219 Gastro-esophageal reflux disease without esophagitis: Secondary | ICD-10-CM | POA: Diagnosis not present

## 2021-10-19 ENCOUNTER — Encounter: Payer: Self-pay | Admitting: Internal Medicine

## 2021-10-19 ENCOUNTER — Other Ambulatory Visit: Payer: Self-pay

## 2021-10-19 ENCOUNTER — Ambulatory Visit (INDEPENDENT_AMBULATORY_CARE_PROVIDER_SITE_OTHER): Payer: Medicare Other | Admitting: Internal Medicine

## 2021-10-19 VITALS — BP 140/74 | HR 68 | Resp 16 | Ht 61.0 in | Wt 231.7 lb

## 2021-10-19 DIAGNOSIS — R7881 Bacteremia: Secondary | ICD-10-CM

## 2021-10-19 NOTE — Progress Notes (Signed)
Patient Active Problem List   Diagnosis Date Noted   Acute renal failure superimposed on stage 3a chronic kidney disease ([place]) 10/06/2021   Accident due to mechanical fall without injury 10/06/2021   Delirium    Respiratory distress    Gram-positive bacteremia    Acute metabolic encephalopathy 34/19/6222   Acute encephalopathy 11/18/2020   DM2 (diabetes mellitus, type 2) ([place]) 11/18/2020   HTN (hypertension) 11/18/2020   Left lower lobe pneumonia 11/18/2020   History of heart valve abnormality 10/24/2017   Postmenopausal bleeding 09/30/2017   Pelvic mass 09/30/2017   Morbid obesity ([place]) 09/30/2017   Endometrial cancer ([place]) 09/30/2017   Pelvic mass in female 09/30/2017   Acute rhinitis 07/04/2013   Bronchitis, subacute 05/29/2013   GERD (gastroesophageal reflux disease) 05/29/2013    Patient's Medications  New Prescriptions   No medications on file  Previous Medications   ACETAMINOPHEN (TYLENOL) 500 MG TABLET    Take 1 tablet (500 mg total) by mouth every 4 (four) hours as needed for moderate pain, headache or fever.   ASPIRIN EC 81 MG TABLET    Take 81 mg by mouth daily.   CETIRIZINE (ZYRTEC) 10 MG TABLET    Take 10 mg by mouth daily.    DICLOFENAC SODIUM (VOLTAREN) 1 % GEL    Apply 2 g topically 2 (two) times daily as needed (pain).   FAMOTIDINE (PEPCID) 20 MG TABLET    Take 20 mg by mouth 2 (two) times daily.   FENOFIBRATE 160 MG TABLET    Take 160 mg by mouth every morning.    FUROSEMIDE (LASIX) 20 MG TABLET    Take 20 mg by mouth 2 (two) times daily.   INSULIN ASPART (NOVOLOG FLEXPEN [place])    Inject 10-20 Units into the skin See admin instructions. 10 units at breakfast, 10 units at lunch, 20 units at dinner   INSULIN DEGLUDEC (TRESIBA) 200 UNIT/ML FLEXTOUCH PEN    Inject 40 Units into the skin daily with breakfast.   IPRATROPIUM (ATROVENT) 0.06 % NASAL SPRAY    Place 2 sprays into both nostrils 3 (three) times daily as needed for allergies.    LIPASE/PROTEASE/AMYLASE (CREON) 36000 UNITS CPEP CAPSULE    Take 36,000-72,000 Units by mouth See admin instructions. 2 capsules (72000 units) with each meal, 1 capsules (36000 units) with each snack   METOPROLOL SUCCINATE (TOPROL-XL) 25 MG 24 HR TABLET    Take 25 mg by mouth daily.   OMEPRAZOLE (PRILOSEC) 20 MG CAPSULE    Take 20 mg by mouth daily.   SIMVASTATIN (ZOCOR) 20 MG TABLET    Take 20 mg by mouth every evening.   TIZANIDINE (ZANAFLEX) 2 MG TABLET    Take 0.5-1 tablets (1-2 mg total) by mouth every 8 (eight) hours as needed for muscle spasms.   TRAMADOL (ULTRAM) 50 MG TABLET    Take 1 tablet (50 mg total) by mouth every 12 (twelve) hours as needed for severe pain or moderate pain.  Modified Medications   No medications on file  Discontinued Medications   No medications on file    Subjective: 76 year old female with history of CKD stage III, diabetes, anxiety, glaucoma, gout, anxiety, hypertension hyperlipidemia presents for hospital follow-up.  She was admitted at [facility] 7/5 - 7/11 for acute encephalopathy and respiratory distress requiring intubation.  She was found to have staph lugdunensis bacteremia present on admission and aspiration pneumonia.  Blood cultures on 7/4 grew 1 /  2 staph lugdunensis, repeat cultures on 7/5 with no growth.  TTE did not show any valvular vegetation. Additional imaging included chest x-ray on 7/1 showing worsening aeration of both lung bases favored to be enlarging b/l effusion thank you.  CT C-spine and head showed no acute abnormality.  Speech eval on 7/showed patient was able to self-feed.  Unable to orthopantogram. Today, patient presents from SNF.  She still has PICC line.  Per staff she is continuing to get IV antibiotics.  Antibiotics: Azithromycin 7/05-06 Ceftriaxone 7/5-6 Cefazolin 7/07-p Vancomycin 7/05 Cultures: Blood 7/04 1/2 Staph lugdunensis 7/5 NG 7/6 NG Review of Systems: Review of Systems  All other systems reviewed and are  negative.   Past Medical History:  Diagnosis Date   Anxiety    Arthritis    "knees are bone on bone and i see dr. Tonita Cong for the cortisone injections "    Cellulitis    hx  of in the legs    Chronic kidney disease (CKD), stage III (moderate) ([place])    Complication of anesthesia    doesnt know the name of the anesthesia , but i was vomiting while i was still under    Diabetes mellitus without complication (HCC)    type 2    GERD (gastroesophageal reflux disease)    Glaucoma    Gout    History of blood transfusion    as child after accident, after childbirth/hemorroid surgery   History of kidney stones    History of radiation therapy    vaginal brachytherapy  11/27/2017-12/25/2017   Dr Gery Pray   Hyperlipidemia    Hypertension    Leukocytes in urine    Lumbar back pain    Sleep apnea    non adherant with cpap device     Social History   Tobacco Use   Smoking status: Never   Smokeless tobacco: Never  Vaping Use   Vaping Use: Never used  Substance Use Topics   Alcohol use: No   Drug use: No    Family History  Problem Relation Age of Onset   Stroke Mother        Cardiac condition   Asthma Father    Colon cancer Father     Allergies  Allergen Reactions   Ozempic (0.25 Or 0.5 Mg-Dose) [Semaglutide(0.25 Or 0.74m-Dos)] Nausea And Vomiting   Amoxil [Amoxicillin] Other (See Comments)    Cellulitis    Glucophage [Metformin] Diarrhea   Invokana [Canagliflozin] Other (See Comments)    UTI   Lipitor [Atorvastatin] Other (See Comments)    Myalgias   Lyrica Cr [Pregabalin Er] Other (See Comments)    Dizziness    Other Nausea And Vomiting    Pt states does not know drug but anesthesia drugs have made her have nausea and vomiting.    Sulfa Antibiotics Diarrhea   Trulicity [Dulaglutide] Other (See Comments)    GI Upset   Potassium-Containing Compounds Rash    Health Maintenance  Topic Date Due   FOOT EXAM  Never done   OPHTHALMOLOGY EXAM  Never done   URINE  MICROALBUMIN  Never done   Hepatitis C Screening  Never done   Zoster Vaccines- Shingrix (1 of 2) Never done   DEXA SCAN  Never done   Pneumonia Vaccine 76 Years old (4 - PPSV23 or PCV20) 06/24/2014   COVID-19 Vaccine (4 - Booster for Janssen series) 03/29/2021   INFLUENZA VACCINE  10/30/2021   HEMOGLOBIN A1C  04/05/2022   COLONOSCOPY (Pts 45-444yr  Insurance coverage will need to be confirmed)  09/02/2022   TETANUS/TDAP  09/21/2031   HPV VACCINES  Aged Out    Objective:  There were no vitals filed for this visit. There is no height or weight on file to calculate BMI.  Physical Exam Constitutional:      Appearance: Normal appearance.  HENT:     Head: Normocephalic and atraumatic.     Right Ear: Tympanic membrane normal.     Left Ear: Tympanic membrane normal.     Nose: Nose normal.     Mouth/Throat:     Mouth: Mucous membranes are moist.  Eyes:     Extraocular Movements: Extraocular movements intact.     Conjunctiva/sclera: Conjunctivae normal.     Pupils: Pupils are equal, round, and reactive to light.  Cardiovascular:     Rate and Rhythm: Normal rate and regular rhythm.     Heart sounds: No murmur heard.    No friction rub. No gallop.     Comments: RUE PICC Pulmonary:     Effort: Pulmonary effort is normal.     Breath sounds: Normal breath sounds.  Abdominal:     General: Abdomen is flat.     Palpations: Abdomen is soft.  Musculoskeletal:        General: Normal range of motion.  Skin:    General: Skin is warm and dry.  Neurological:     General: No focal deficit present.     Mental Status: She is alert and oriented to person, place, and time.  Psychiatric:        Mood and Affect: Mood normal.     Lab Results Lab Results  Component Value Date   WBC 6.9 10/06/2021   HGB 9.1 (L) 10/06/2021   HCT 27.3 (L) 10/06/2021   MCV 95.5 10/06/2021   PLT 116 (L) 10/06/2021    Lab Results  Component Value Date   CREATININE 1.84 (H) 10/09/2021   BUN 42 (H)  10/09/2021   NA 141 10/09/2021   K 4.2 10/09/2021   CL 109 10/09/2021   CO2 23 10/09/2021    Lab Results  Component Value Date   ALT 20 10/03/2021   AST 40 10/03/2021   ALKPHOS 70 10/03/2021   BILITOT 1.7 (H) 10/03/2021    No results found for: "CHOL", "HDL", "LDLCALC", "LDLDIRECT", "TRIG", "CHOLHDL" Lab Results  Component Value Date   LABRPR NON REACTIVE 11/20/2020   No results found for: "HIV1RNAQUANT", "HIV1RNAVL", "CD4TABS"   A/P #Staph lugdunensis bacteremia 2/2 unclear source  #Hx of falls #Poor dentition #CKD stage IV -Pt had acute encephalopathy in the setting of bacteriemia -Source of bacteremia could be 2/2 aspiration event vs falls. Pt has hx of multiple falls, she had a fall prior to admission. Given staph species; more likely falls is the source of bacteremia.  -Suspect bacteremia led to falls->encephalopathy/aspiration.   -Unable to do orthopantogram, as pt not able to stand up. Will order CT.  -Cefazolin x weeks from negative Cx EOT 7/18, but pt is still on antibiotics -Labs reviewed on 7/17 wbc 9, scr 1.74(new baseline), esr 71, crp 43 Plan: -Stop antibiotics -Take out PICC -CT maxillofacial -Follow-up 3 weeks   I spent more than 45 minutes for this patient encounter including reviewing data/chart, and coordinating care and >50% direct face to face time providing counseling/discussing diagnostics/treatment plan with patient   Laurice Record, [place] 10/19/2021, 3:53 PM

## 2021-10-29 DIAGNOSIS — Z79899 Other long term (current) drug therapy: Secondary | ICD-10-CM | POA: Diagnosis not present

## 2021-10-29 DIAGNOSIS — N1831 Chronic kidney disease, stage 3a: Secondary | ICD-10-CM | POA: Diagnosis not present

## 2021-10-30 DIAGNOSIS — E46 Unspecified protein-calorie malnutrition: Secondary | ICD-10-CM | POA: Diagnosis not present

## 2021-10-30 DIAGNOSIS — E8809 Other disorders of plasma-protein metabolism, not elsewhere classified: Secondary | ICD-10-CM | POA: Diagnosis not present

## 2021-10-30 DIAGNOSIS — K219 Gastro-esophageal reflux disease without esophagitis: Secondary | ICD-10-CM | POA: Diagnosis not present

## 2021-10-30 DIAGNOSIS — I10 Essential (primary) hypertension: Secondary | ICD-10-CM | POA: Diagnosis not present

## 2021-10-30 DIAGNOSIS — N189 Chronic kidney disease, unspecified: Secondary | ICD-10-CM | POA: Diagnosis not present

## 2021-10-30 DIAGNOSIS — E785 Hyperlipidemia, unspecified: Secondary | ICD-10-CM | POA: Diagnosis not present

## 2021-11-05 DIAGNOSIS — Z79899 Other long term (current) drug therapy: Secondary | ICD-10-CM | POA: Diagnosis not present

## 2021-11-05 DIAGNOSIS — R52 Pain, unspecified: Secondary | ICD-10-CM | POA: Diagnosis not present

## 2021-11-05 DIAGNOSIS — G8929 Other chronic pain: Secondary | ICD-10-CM | POA: Diagnosis not present

## 2021-11-08 ENCOUNTER — Encounter: Payer: Self-pay | Admitting: Internal Medicine

## 2021-11-08 ENCOUNTER — Ambulatory Visit (INDEPENDENT_AMBULATORY_CARE_PROVIDER_SITE_OTHER): Payer: Medicare Other | Admitting: Internal Medicine

## 2021-11-08 ENCOUNTER — Ambulatory Visit (HOSPITAL_BASED_OUTPATIENT_CLINIC_OR_DEPARTMENT_OTHER)
Admission: RE | Admit: 2021-11-08 | Discharge: 2021-11-08 | Disposition: A | Discharge status: Home / Self Care | Payer: Medicare Other | Source: Ambulatory Visit | Attending: Internal Medicine | Admitting: Internal Medicine

## 2021-11-08 ENCOUNTER — Other Ambulatory Visit: Payer: Self-pay

## 2021-11-08 VITALS — BP 111/68 | HR 66 | Temp 97.6°F

## 2021-11-08 DIAGNOSIS — R7881 Bacteremia: Secondary | ICD-10-CM

## 2021-11-08 DIAGNOSIS — K089 Disorder of teeth and supporting structures, unspecified: Secondary | ICD-10-CM | POA: Diagnosis not present

## 2021-11-08 NOTE — Progress Notes (Signed)
Patient Active Problem List   Diagnosis Date Noted   Acute renal failure superimposed on stage 3a chronic kidney disease ([employer]) 10/06/2021   Accident due to mechanical fall without injury 10/06/2021   Delirium    Respiratory distress    Gram-positive bacteremia    Acute metabolic encephalopathy 51/05/5850   Acute encephalopathy 11/18/2020   DM2 (diabetes mellitus, type 2) ([place]) 11/18/2020   HTN (hypertension) 11/18/2020   Left lower lobe pneumonia 11/18/2020   History of heart valve abnormality 10/24/2017   Postmenopausal bleeding 09/30/2017   Pelvic mass 09/30/2017   Morbid obesity ([place]) 09/30/2017   Endometrial cancer ([place]) 09/30/2017   Pelvic mass in female 09/30/2017   Acute rhinitis 07/04/2013   Bronchitis, subacute 05/29/2013   GERD (gastroesophageal reflux disease) 05/29/2013    Patient's Medications  New Prescriptions   No medications on file  Previous Medications   ACETAMINOPHEN (TYLENOL) 500 MG TABLET    Take 1 tablet (500 mg total) by mouth every 4 (four) hours as needed for moderate pain, headache or fever.   ASPIRIN EC 81 MG TABLET    Take 81 mg by mouth daily.   CEFAZOLIN (ANCEF) IVPB    Inject 2 g into the vein every 12 (twelve) hours. End date 10/19/21   CETIRIZINE (ZYRTEC) 10 MG TABLET    Take 10 mg by mouth daily.    CHOLECALCIFEROL 1.25 MG (50000 UT) CAPSULE    Take 50,000 Units by mouth daily.   DICLOFENAC SODIUM (VOLTAREN) 1 % GEL    Apply 2 g topically 2 (two) times daily as needed (pain).   FAMOTIDINE (PEPCID) 20 MG TABLET    Take 20 mg by mouth 2 (two) times daily.   FENOFIBRATE 160 MG TABLET    Take 160 mg by mouth every morning.    FERROUS SULFATE 325 (65 FE) MG TABLET    Take 325 mg by mouth daily with breakfast.   FUROSEMIDE (LASIX) 20 MG TABLET    Take 20 mg by mouth 2 (two) times daily.   INSULIN ASPART (NOVOLOG FLEXPEN [place])    Inject 10-20 Units into the skin See admin instructions. 10 units at breakfast, 10 units at lunch, 20 units at  dinner   INSULIN DEGLUDEC (TRESIBA) 200 UNIT/ML FLEXTOUCH PEN    Inject 40 Units into the skin daily with breakfast.   IPRATROPIUM (ATROVENT) 0.06 % NASAL SPRAY    Place 2 sprays into both nostrils 3 (three) times daily as needed for allergies.   LIPASE/PROTEASE/AMYLASE (CREON) 36000 UNITS CPEP CAPSULE    Take 36,000-72,000 Units by mouth See admin instructions. 2 capsules (72000 units) with each meal, 1 capsules (36000 units) with each snack   METOPROLOL SUCCINATE (TOPROL-XL) 25 MG 24 HR TABLET    Take 25 mg by mouth daily.   OMEPRAZOLE (PRILOSEC) 20 MG CAPSULE    Take 20 mg by mouth daily.   TIZANIDINE (ZANAFLEX) 2 MG TABLET    Take 0.5-1 tablets (1-2 mg total) by mouth every 8 (eight) hours as needed for muscle spasms.   TRAMADOL (ULTRAM) 50 MG TABLET    Take 1 tablet (50 mg total) by mouth every 12 (twelve) hours as needed for severe pain or moderate pain.   VITAMIN B-12 (CYANOCOBALAMIN) 250 MCG TABLET    Take 250 mcg by mouth daily.  Modified Medications   No medications on file  Discontinued Medications   No medications on file    Subjective: 76 year old female  with history of CKD stage III, diabetes, anxiety, glaucoma, gout, anxiety, hypertension hyperlipidemia presents for hospital follow-up.  She was admitted at [facility] 7/5 - 7/11 for acute encephalopathy and respiratory distress requiring intubation.  She was found to have staph lugdunensis bacteremia present on admission and aspiration pneumonia.  Blood cultures on 7/4 grew 1 / 2 staph lugdunensis, repeat cultures on 7/5 with no growth.  TTE did not show any valvular vegetation. Additional imaging included chest x-ray on 7/1 showing worsening aeration of both lung bases favored to be enlarging b/l effusion thank you.  CT C-spine and head showed no acute abnormality.  Speech eval on 7/showed patient was able to self-feed.  Unable to orthopantogram. 10/19/21, patient presents from SNF.  She still has PICC line.  Per staff she is  continuing to get IV antibiotics.  Today: Pt reports she feels well. Denies fevers and chills. CT scheduled for today. PICC out. Antibiotics: Azithromycin 7/05-06 Ceftriaxone 7/5-6 Cefazolin 7/07-7/21 Vancomycin 7/05 Cultures: Blood 7/04 1/2 Staph lugdunensis 7/5 NG 7/6 NG  Review of Systems: Review of Systems  All other systems reviewed and are negative.   Past Medical History:  Diagnosis Date   Anxiety    Arthritis    "knees are bone on bone and i see dr. Tonita Cong for the cortisone injections "    Cellulitis    hx  of in the legs    Chronic kidney disease (CKD), stage III (moderate) ([place])    Complication of anesthesia    doesnt know the name of the anesthesia , but i was vomiting while i was still under    Diabetes mellitus without complication (HCC)    type 2    GERD (gastroesophageal reflux disease)    Glaucoma    Gout    History of blood transfusion    as child after accident, after childbirth/hemorroid surgery   History of kidney stones    History of radiation therapy    vaginal brachytherapy  11/27/2017-12/25/2017   Dr Gery Pray   Hyperlipidemia    Hypertension    Leukocytes in urine    Lumbar back pain    Sleep apnea    non adherant with cpap device     Social History   Tobacco Use   Smoking status: Never   Smokeless tobacco: Never  Vaping Use   Vaping Use: Never used  Substance Use Topics   Alcohol use: No   Drug use: No    Family History  Problem Relation Age of Onset   Stroke Mother        Cardiac condition   Asthma Father    Colon cancer Father     Allergies  Allergen Reactions   Ozempic (0.25 Or 0.5 Mg-Dose) [Semaglutide(0.25 Or 0.27m-Dos)] Nausea And Vomiting   Amoxil [Amoxicillin] Other (See Comments)    Cellulitis    Glucophage [Metformin] Diarrhea   Invokana [Canagliflozin] Other (See Comments)    UTI   Lipitor [Atorvastatin] Other (See Comments)    Myalgias   Lyrica Cr [Pregabalin Er] Other (See Comments)    Dizziness     Other Nausea And Vomiting    Pt states does not know drug but anesthesia drugs have made her have nausea and vomiting.    Sulfa Antibiotics Diarrhea   Trulicity [Dulaglutide] Other (See Comments)    GI Upset   Potassium-Containing Compounds Rash    Health Maintenance  Topic Date Due   FOOT EXAM  Never done   OPHTHALMOLOGY EXAM  Never done   URINE MICROALBUMIN  Never done   Hepatitis C Screening  Never done   Zoster Vaccines- Shingrix (1 of 2) Never done   DEXA SCAN  Never done   Pneumonia Vaccine 43+ Years old (4 - PPSV23 or PCV20) 06/24/2014   COVID-19 Vaccine (4 - [place] risk series) 03/29/2021   INFLUENZA VACCINE  10/30/2021   HEMOGLOBIN A1C  04/05/2022   COLONOSCOPY (Pts 45-34yr Insurance coverage will need to be confirmed)  09/02/2022   TETANUS/TDAP  09/21/2031   HPV VACCINES  Aged Out    Objective:  There were no vitals filed for this visit. There is no height or weight on file to calculate BMI.  Physical Exam Constitutional:      Appearance: Normal appearance.  HENT:     Head: Normocephalic and atraumatic.     Right Ear: Tympanic membrane normal.     Left Ear: Tympanic membrane normal.     Nose: Nose normal.     Mouth/Throat:     Mouth: Mucous membranes are moist.  Eyes:     Extraocular Movements: Extraocular movements intact.     Conjunctiva/sclera: Conjunctivae normal.     Pupils: Pupils are equal, round, and reactive to light.  Cardiovascular:     Rate and Rhythm: Normal rate and regular rhythm.     Heart sounds: No murmur heard.    No friction rub. No gallop.  Pulmonary:     Effort: Pulmonary effort is normal.     Breath sounds: Normal breath sounds.  Abdominal:     General: Abdomen is flat.     Palpations: Abdomen is soft.  Musculoskeletal:        General: Normal range of motion.  Skin:    General: Skin is warm and dry.  Neurological:     General: No focal deficit present.     Mental Status: She is alert and oriented to person, place, and  time.  Psychiatric:        Mood and Affect: Mood normal.     Lab Results Lab Results  Component Value Date   WBC 6.9 10/06/2021   HGB 9.1 (L) 10/06/2021   HCT 27.3 (L) 10/06/2021   MCV 95.5 10/06/2021   PLT 116 (L) 10/06/2021    Lab Results  Component Value Date   CREATININE 1.84 (H) 10/09/2021   BUN 42 (H) 10/09/2021   NA 141 10/09/2021   K 4.2 10/09/2021   CL 109 10/09/2021   CO2 23 10/09/2021    Lab Results  Component Value Date   ALT 20 10/03/2021   AST 40 10/03/2021   ALKPHOS 70 10/03/2021   BILITOT 1.7 (H) 10/03/2021    No results found for: "CHOL", "HDL", "LDLCALC", "LDLDIRECT", "TRIG", "CHOLHDL" Lab Results  Component Value Date   LABRPR NON REACTIVE 11/20/2020   No results found for: "HIV1RNAQUANT", "HIV1RNAVL", "CD4TABS"   A/P #Staph lugdunensis bacteremia 2/2 unclear source  #Hx of falls #Poor dentition #CKD stage IV -Pt had acute encephalopathy in the setting of bacteremia -Source of bacteremia could be 2/2 aspiration event vs falls. Pt has hx of multiple falls, she had a fall prior to admission. Given staph species; more likely falls is the source of bacteremia.  -Suspect bacteremia led to falls->encephalopathy/aspiration.   -Unable to do orthopantogram, as pt not able to stand up. Will order CT.  -Cefazolin x 6 weeks from negative Cx EOT 7/18, but pt is still on antibiotics at 7/21 visit. Labs reviewed on 7/17 wbc 9,  scr 1.74(new baseline), esr 71, crp 43 -CT scheduled for today Plan: -labs today cbc, cmp, esr, crp -Follow-up CT maxillofacial -Follow-up  1 week to follow-up on imaging, ok with tele-visit     Laurice Record, MD [facility] 11/08/2021, 8:49 AM

## 2021-11-09 LAB — C-REACTIVE PROTEIN: CRP: 28.6 mg/L — ABNORMAL HIGH (ref <8.0)

## 2021-11-09 LAB — COMPLETE METABOLIC PANEL WITH GFR
AG Ratio: 1.1 (calc) (ref 1.0–2.5)
ALT: 10 U/L (ref 6–29)
AST: 33 U/L (ref 10–35)
Albumin: 2.8 g/dL — ABNORMAL LOW (ref 3.6–5.1)
Alkaline phosphatase (APISO): 83 U/L (ref 37–153)
BUN/Creatinine Ratio: 36 (calc) — ABNORMAL HIGH (ref 6–22)
BUN: 71 mg/dL — ABNORMAL HIGH (ref 7–25)
CO2: 25 mmol/L (ref 20–32)
Calcium: 9 mg/dL (ref 8.6–10.4)
Chloride: 108 mmol/L (ref 98–110)
Creat: 2 mg/dL — ABNORMAL HIGH (ref 0.60–1.00)
Globulin: 2.6 g/dL (calc) (ref 1.9–3.7)
Glucose, Bld: 166 mg/dL — ABNORMAL HIGH (ref 65–99)
Potassium: 4.5 mmol/L (ref 3.5–5.3)
Sodium: 142 mmol/L (ref 135–146)
Total Bilirubin: 0.5 mg/dL (ref 0.2–1.2)
Total Protein: 5.4 g/dL — ABNORMAL LOW (ref 6.1–8.1)
eGFR: 26 mL/min/{1.73_m2} — ABNORMAL LOW (ref >=60)

## 2021-11-09 LAB — CBC WITH DIFFERENTIAL/PLATELET
Absolute Monocytes: 931 cells/uL (ref 200–950)
Basophils Absolute: 113 cells/uL (ref 0–200)
Basophils Relative: 1.2 %
Eosinophils Absolute: 442 cells/uL (ref 15–500)
Eosinophils Relative: 4.7 %
HCT: 31.6 % — ABNORMAL LOW (ref 35.0–45.0)
Hemoglobin: 10.6 g/dL — ABNORMAL LOW (ref 11.7–15.5)
Lymphs Abs: 2331 cells/uL (ref 850–3900)
MCH: 31.5 pg (ref 27.0–33.0)
MCHC: 33.5 g/dL (ref 32.0–36.0)
MCV: 93.8 fL (ref 80.0–100.0)
MPV: 10 fL (ref 7.5–12.5)
Monocytes Relative: 9.9 %
Neutro Abs: 5584 cells/uL (ref 1500–7800)
Neutrophils Relative %: 59.4 %
Platelets: 242 10*3/uL (ref 140–400)
RBC: 3.37 10*6/uL — ABNORMAL LOW (ref 3.80–5.10)
RDW: 13.9 % (ref 11.0–15.0)
Total Lymphocyte: 24.8 %
WBC: 9.4 10*3/uL (ref 3.8–10.8)

## 2021-11-09 LAB — SEDIMENTATION RATE: Sed Rate: 70 mm/h — ABNORMAL HIGH (ref 0–30)

## 2021-11-13 DIAGNOSIS — E8809 Other disorders of plasma-protein metabolism, not elsewhere classified: Secondary | ICD-10-CM | POA: Diagnosis not present

## 2021-11-13 DIAGNOSIS — E785 Hyperlipidemia, unspecified: Secondary | ICD-10-CM | POA: Diagnosis not present

## 2021-11-13 DIAGNOSIS — B957 Other staphylococcus as the cause of diseases classified elsewhere: Secondary | ICD-10-CM | POA: Diagnosis not present

## 2021-11-21 DIAGNOSIS — E785 Hyperlipidemia, unspecified: Secondary | ICD-10-CM | POA: Diagnosis not present

## 2021-11-21 DIAGNOSIS — M170 Bilateral primary osteoarthritis of knee: Secondary | ICD-10-CM | POA: Diagnosis not present

## 2021-11-21 DIAGNOSIS — E119 Type 2 diabetes mellitus without complications: Secondary | ICD-10-CM | POA: Diagnosis not present

## 2021-11-21 DIAGNOSIS — I10 Essential (primary) hypertension: Secondary | ICD-10-CM | POA: Diagnosis not present

## 2021-11-26 ENCOUNTER — Other Ambulatory Visit: Payer: Self-pay

## 2021-11-26 ENCOUNTER — Ambulatory Visit (INDEPENDENT_AMBULATORY_CARE_PROVIDER_SITE_OTHER): Payer: Medicare Other | Admitting: Internal Medicine

## 2021-11-26 DIAGNOSIS — K029 Dental caries, unspecified: Secondary | ICD-10-CM | POA: Diagnosis not present

## 2021-11-26 NOTE — Progress Notes (Signed)
Subjective:    Patient ID: Elizabeth Crosby, female    DOB: 16-Jan-1946, 76 y.o.   MRN: [id number]  Chief Complaint  Patient presents with   [place]      Virtual Visit via Telephone/Video Note   I connected with Elizabeth Crosby on 11/26/2021 at 9:20 AM by telephone and verified that I am speaking with the correct person using two identifiers.   I discussed the limitations, risks, security and privacy concerns of performing an evaluation and management service by telephone and the availability of in person appointments. I also discussed with the patient that there may be a patient responsible charge related to this service. The patient expressed understanding and agreed to proceed.  Location:  Patient: SNF Provider: [facility]   HPI: 76 year old female with history of CKD stage III, diabetes, anxiety, glaucoma, gout, anxiety, hypertension hyperlipidemia presents for hospital follow-up.  She was admitted at [facility] 7/5 - 7/11 for acute encephalopathy and respiratory distress requiring intubation.  She was found to have staph lugdunensis bacteremia present on admission and aspiration pneumonia.  Blood cultures on 7/4 grew 1 / 2 staph lugdunensis, repeat cultures on 7/5 with no growth.  TTE did not show any valvular vegetation. Additional imaging included chest x-ray on 7/1 showing worsening aeration of both lung bases favored to be enlarging b/l effusion thank you.  CT C-spine and head showed no acute abnormality.  Speech eval on 7/showed patient was able to self-feed.  Unable to orthopantogram. 10/19/21, patient presents from SNF.  She still has PICC line.  Per staff she is continuing to get IV antibiotics.   8/10: Pt reports she feels well. Denies fevers and chills. CT scheduled for today. PICC out.  8/28 televisit: no new complaints. Pt's caregiver present during the visit.  Antibiotics: Azithromycin 7/05-06 Ceftriaxone 7/5-6 Cefazolin 7/07-7/21 Vancomycin  7/05 Cultures: Blood 7/04 1/2 Staph lugdunensis 7/5 NG 7/6 NG   Allergies  Allergen Reactions   Ozempic (0.25 Or 0.5 Mg-Dose) [Semaglutide(0.25 Or 0.39m-Dos)] Nausea And Vomiting   Amoxil [Amoxicillin] Other (See Comments)    Cellulitis    Glucophage [Metformin] Diarrhea   Invokana [Canagliflozin] Other (See Comments)    UTI   Lipitor [Atorvastatin] Other (See Comments)    Myalgias   Lyrica Cr [Pregabalin Er] Other (See Comments)    Dizziness    Metformin And Related    Other Nausea And Vomiting    Pt states does not know drug but anesthesia drugs have made her have nausea and vomiting.    Sulfa Antibiotics Diarrhea   Trulicity [Dulaglutide] Other (See Comments)    GI Upset   Potassium-Containing Compounds Rash      Outpatient Medications Prior to Visit  Medication Sig Dispense Refill   acetaminophen (TYLENOL) 500 MG tablet Take 1 tablet (500 mg total) by mouth every 4 (four) hours as needed for moderate pain, headache or fever.     amLODipine (NORVASC) 5 MG tablet Take 5 mg by mouth daily.     ascorbic acid (VITAMIN C) 500 MG tablet Take 500 mg by mouth daily.     aspirin EC 81 MG tablet Take 81 mg by mouth daily.     cetirizine (ZYRTEC) 10 MG tablet Take 10 mg by mouth daily.      Cholecalciferol 1.25 MG (50000 UT) capsule Take 50,000 Units by mouth daily.     diclofenac Sodium (VOLTAREN) 1 % GEL Apply 2 g topically 2 (two) times daily as  needed (pain).     famotidine (PEPCID) 20 MG tablet Take 20 mg by mouth 2 (two) times daily.     fenofibrate 160 MG tablet Take 160 mg by mouth every morning.      ferrous sulfate 325 (65 FE) MG tablet Take 325 mg by mouth daily with breakfast.     furosemide (LASIX) 20 MG tablet Take 20 mg by mouth 2 (two) times daily.     Insulin Aspart (NOVOLOG FLEXPEN [place]) Inject 10-20 Units into the skin See admin instructions. 10 units at breakfast, 10 units at lunch, 20 units at dinner     insulin degludec (TRESIBA) 200 UNIT/ML FlexTouch Pen  Inject 40 Units into the skin daily with breakfast.     ipratropium (ATROVENT) 0.06 % nasal spray Place 2 sprays into both nostrils 3 (three) times daily as needed for allergies.     lipase/protease/amylase (CREON) 36000 UNITS CPEP capsule Take 36,000-72,000 Units by mouth See admin instructions. 2 capsules (72000 units) with each meal, 1 capsules (36000 units) with each snack     metoprolol succinate (TOPROL-XL) 25 MG 24 hr tablet Take 25 mg by mouth daily.     omeprazole (PRILOSEC) 20 MG capsule Take 20 mg by mouth daily.     simvastatin (ZOCOR) 20 MG tablet Take 20 mg by mouth daily.     tiZANidine (ZANAFLEX) 2 MG tablet Take 0.5-1 tablets (1-2 mg total) by mouth every 8 (eight) hours as needed for muscle spasms. 30 tablet 0   traMADol (ULTRAM) 50 MG tablet Take 1 tablet (50 mg total) by mouth every 12 (twelve) hours as needed for severe pain or moderate pain. 10 tablet 0   vitamin B-12 (CYANOCOBALAMIN) 250 MCG tablet Take 250 mcg by mouth daily.     ceFAZolin (ANCEF) IVPB Inject 2 g into the vein every 12 (twelve) hours. End date 10/19/21 (Patient not taking: Reported on 11/26/2021)     No facility-administered medications prior to visit.     Past Medical History:  Diagnosis Date   Anxiety    Arthritis    "knees are bone on bone and i see dr. Tonita Cong for the cortisone injections "    Cellulitis    hx  of in the legs    Chronic kidney disease (CKD), stage III (moderate) ([place])    Complication of anesthesia    doesnt know the name of the anesthesia , but i was vomiting while i was still under    Diabetes mellitus without complication ([place])    type 2    GERD (gastroesophageal reflux disease)    Glaucoma    Gout    History of blood transfusion    as child after accident, after childbirth/hemorroid surgery   History of kidney stones    History of radiation therapy    vaginal brachytherapy  11/27/2017-12/25/2017   Dr Gery Pray   Hyperlipidemia    Hypertension    Leukocytes in urine     Lumbar back pain    Sleep apnea    non adherant with cpap device      Past Surgical History:  Procedure Laterality Date   APPENDECTOMY  1972   CATARACT EXTRACTION W/PHACO Right 01/10/2014   Procedure: CATARACT EXTRACTION PHACO AND INTRAOCULAR LENS PLACEMENT ([place]);  Surgeon: Tonny Branch, MD;  Location: [facility];  Service: Ophthalmology;  Laterality: Right;  CDE:5.78   CATARACT EXTRACTION W/PHACO Left 01/27/2014   Procedure: CATARACT EXTRACTION PHACO AND INTRAOCULAR LENS PLACEMENT LEFT EYE CDE=5.75;  Surgeon: Levada Dy  Geoffry Paradise, MD;  Location: [facility];  Service: Ophthalmology;  Laterality: Left;   CHOLECYSTECTOMY  1972   COLONOSCOPY WITH PROPOFOL N/A 09/01/2012   Procedure: COLONOSCOPY WITH PROPOFOL;  Surgeon: Garlan Fair, MD;  Location: [facility];  Service: Endoscopy;  Laterality: N/A;   KIDNEY STONE SURGERY     OMENTECTOMY  09/30/2017   Procedure: OMENTECTOMY;  Surgeon: [name], MD;  Location: [facility];  Service: Gynecology;;   ROBOTIC ASSISTED TOTAL HYSTERECTOMY WITH BILATERAL SALPINGO OOPHERECTOMY Bilateral 09/30/2017   Procedure: ROBOTIC ASSISTED TOTAL HYSTERECTOMY WITH BILATERAL SALPINGO OOPHORECTOMY;  Surgeon: [name], MD;  Location: [facility];  Service: Gynecology;  Laterality: Bilateral;       Review of Systems    Objective:    Nursing note and vital signs reviewed.     Assessment & Plan:   #Staph lugdunensis bacteremia 2/2 unclear source  #Hx of falls #Poor dentition #CKD stage IV -Pt had acute encephalopathy in the setting of bacteremia -Source of bacteremia could be 2/2 aspiration event vs falls. Pt has hx of multiple falls, she had a fall prior to admission. Given staph species; more likely falls is the source of bacteremia.  -Suspect bacteremia led to falls->encephalopathy/aspiration.   -Unable to do orthopantogram, as pt not able to stand up. Will order CT.  -Cefazolin x 6 weeks from negative Cx EOT 7/18, but pt is still on antibiotics at 7/21 visit. Labs reviewed on  7/17 wbc 9, scr 1.74(new baseline), esr 71, crp 43 -CT maxilofacial showed no abscess, but did show dental carries Plan: -Follow up PRN -Follow-up with Dentistry for dental carries    I am having Shirlee Limerick maintain her fenofibrate, cetirizine, insulin degludec, omeprazole, aspirin EC, acetaminophen, diclofenac Sodium, famotidine, furosemide, ipratropium, metoprolol succinate, lipase/protease/amylase, Insulin Aspart (NOVOLOG FLEXPEN [place]), tiZANidine, traMADol, ceFAZolin, Cholecalciferol, vitamin B-12, ferrous sulfate, amLODipine, ascorbic acid, and simvastatin.   No orders of the defined types were placed in this encounter.    I discussed the assessment and treatment plan with the patient. The patient was provided an opportunity to ask questions and all were answered. The patient agreed with the plan and demonstrated an understanding of the instructions.   The patient was advised to call back or seek an in-person evaluation if the symptoms worsen or if the condition fails to improve as anticipated.   I provided  15 minutes of non-face-to-face time during this encounter.  Follow-up: PRN

## 2021-11-28 ENCOUNTER — Ambulatory Visit: Payer: Self-pay

## 2021-11-28 NOTE — Patient Outreach (Signed)
  Care Coordination   11/28/2021 Name: Elizabeth Crosby MRN: [id number] DOB: 16-Mar-1946   Care Coordination Outreach Attempts:  An unsuccessful telephone outreach was attempted today to offer the patient information about available care coordination services as a benefit of their health plan.   Follow Up Plan:  Additional outreach attempts will be made to offer the patient care coordination information and services.   Encounter Outcome:  No Answer  Care Coordination Interventions Activated:  No   Care Coordination Interventions:  No, not indicated    [place] [phone]

## 2021-12-12 DIAGNOSIS — I10 Essential (primary) hypertension: Secondary | ICD-10-CM | POA: Diagnosis not present

## 2021-12-12 DIAGNOSIS — M170 Bilateral primary osteoarthritis of knee: Secondary | ICD-10-CM | POA: Diagnosis not present

## 2021-12-12 DIAGNOSIS — E8809 Other disorders of plasma-protein metabolism, not elsewhere classified: Secondary | ICD-10-CM | POA: Diagnosis not present

## 2021-12-12 DIAGNOSIS — E785 Hyperlipidemia, unspecified: Secondary | ICD-10-CM | POA: Diagnosis not present

## 2021-12-12 DIAGNOSIS — E119 Type 2 diabetes mellitus without complications: Secondary | ICD-10-CM | POA: Diagnosis not present

## 2021-12-14 ENCOUNTER — Inpatient Hospital Stay (HOSPITAL_COMMUNITY)
Admission: EM | Admit: 2021-12-14 | Discharge: 2021-12-28 | DRG: 100 | Disposition: A | Discharge status: Skilled Nursing Facility | Payer: Medicare Other | Source: Skilled Nursing Facility | Attending: Family Medicine | Admitting: Family Medicine

## 2021-12-14 ENCOUNTER — Encounter (HOSPITAL_COMMUNITY): Payer: Self-pay

## 2021-12-14 ENCOUNTER — Emergency Department (HOSPITAL_COMMUNITY): Payer: Medicare Other

## 2021-12-14 ENCOUNTER — Inpatient Hospital Stay (HOSPITAL_COMMUNITY): Payer: Medicare Other

## 2021-12-14 ENCOUNTER — Encounter (HOSPITAL_COMMUNITY): Payer: Self-pay | Admitting: *Deleted

## 2021-12-14 ENCOUNTER — Other Ambulatory Visit: Payer: Self-pay

## 2021-12-14 DIAGNOSIS — G931 Anoxic brain damage, not elsewhere classified: Secondary | ICD-10-CM | POA: Diagnosis not present

## 2021-12-14 DIAGNOSIS — E43 Unspecified severe protein-calorie malnutrition: Secondary | ICD-10-CM | POA: Diagnosis not present

## 2021-12-14 DIAGNOSIS — E874 Mixed disorder of acid-base balance: Secondary | ICD-10-CM | POA: Diagnosis present

## 2021-12-14 DIAGNOSIS — Z825 Family history of asthma and other chronic lower respiratory diseases: Secondary | ICD-10-CM

## 2021-12-14 DIAGNOSIS — M170 Bilateral primary osteoarthritis of knee: Secondary | ICD-10-CM | POA: Diagnosis present

## 2021-12-14 DIAGNOSIS — R5381 Other malaise: Secondary | ICD-10-CM | POA: Diagnosis not present

## 2021-12-14 DIAGNOSIS — E873 Alkalosis: Secondary | ICD-10-CM | POA: Diagnosis present

## 2021-12-14 DIAGNOSIS — K0889 Other specified disorders of teeth and supporting structures: Secondary | ICD-10-CM | POA: Diagnosis present

## 2021-12-14 DIAGNOSIS — I452 Bifascicular block: Secondary | ICD-10-CM | POA: Diagnosis not present

## 2021-12-14 DIAGNOSIS — Z7401 Bed confinement status: Secondary | ICD-10-CM | POA: Diagnosis not present

## 2021-12-14 DIAGNOSIS — N179 Acute kidney failure, unspecified: Secondary | ICD-10-CM | POA: Diagnosis not present

## 2021-12-14 DIAGNOSIS — I129 Hypertensive chronic kidney disease with stage 1 through stage 4 chronic kidney disease, or unspecified chronic kidney disease: Secondary | ICD-10-CM | POA: Diagnosis present

## 2021-12-14 DIAGNOSIS — E1169 Type 2 diabetes mellitus with other specified complication: Secondary | ICD-10-CM

## 2021-12-14 DIAGNOSIS — Z794 Long term (current) use of insulin: Secondary | ICD-10-CM | POA: Diagnosis not present

## 2021-12-14 DIAGNOSIS — Z515 Encounter for palliative care: Secondary | ICD-10-CM

## 2021-12-14 DIAGNOSIS — E1165 Type 2 diabetes mellitus with hyperglycemia: Secondary | ICD-10-CM | POA: Diagnosis not present

## 2021-12-14 DIAGNOSIS — J9601 Acute respiratory failure with hypoxia: Secondary | ICD-10-CM | POA: Diagnosis not present

## 2021-12-14 DIAGNOSIS — I6522 Occlusion and stenosis of left carotid artery: Secondary | ICD-10-CM | POA: Diagnosis not present

## 2021-12-14 DIAGNOSIS — E785 Hyperlipidemia, unspecified: Secondary | ICD-10-CM | POA: Diagnosis present

## 2021-12-14 DIAGNOSIS — H409 Unspecified glaucoma: Secondary | ICD-10-CM | POA: Diagnosis present

## 2021-12-14 DIAGNOSIS — R1312 Dysphagia, oropharyngeal phase: Secondary | ICD-10-CM | POA: Diagnosis not present

## 2021-12-14 DIAGNOSIS — R1310 Dysphagia, unspecified: Secondary | ICD-10-CM | POA: Diagnosis not present

## 2021-12-14 DIAGNOSIS — Z888 Allergy status to other drugs, medicaments and biological substances status: Secondary | ICD-10-CM

## 2021-12-14 DIAGNOSIS — Z9841 Cataract extraction status, right eye: Secondary | ICD-10-CM

## 2021-12-14 DIAGNOSIS — Z66 Do not resuscitate: Secondary | ICD-10-CM | POA: Diagnosis not present

## 2021-12-14 DIAGNOSIS — I10 Essential (primary) hypertension: Secondary | ICD-10-CM | POA: Diagnosis present

## 2021-12-14 DIAGNOSIS — R6889 Other general symptoms and signs: Secondary | ICD-10-CM | POA: Diagnosis not present

## 2021-12-14 DIAGNOSIS — T41295A Adverse effect of other general anesthetics, initial encounter: Secondary | ICD-10-CM | POA: Diagnosis not present

## 2021-12-14 DIAGNOSIS — Z79899 Other long term (current) drug therapy: Secondary | ICD-10-CM

## 2021-12-14 DIAGNOSIS — Z7982 Long term (current) use of aspirin: Secondary | ICD-10-CM

## 2021-12-14 DIAGNOSIS — D72829 Elevated white blood cell count, unspecified: Secondary | ICD-10-CM | POA: Diagnosis not present

## 2021-12-14 DIAGNOSIS — R29898 Other symptoms and signs involving the musculoskeletal system: Secondary | ICD-10-CM | POA: Diagnosis present

## 2021-12-14 DIAGNOSIS — J151 Pneumonia due to Pseudomonas: Secondary | ICD-10-CM | POA: Diagnosis not present

## 2021-12-14 DIAGNOSIS — E119 Type 2 diabetes mellitus without complications: Secondary | ICD-10-CM

## 2021-12-14 DIAGNOSIS — R29718 NIHSS score 18: Secondary | ICD-10-CM | POA: Diagnosis present

## 2021-12-14 DIAGNOSIS — Z20822 Contact with and (suspected) exposure to covid-19: Secondary | ICD-10-CM | POA: Diagnosis present

## 2021-12-14 DIAGNOSIS — J9600 Acute respiratory failure, unspecified whether with hypoxia or hypercapnia: Secondary | ICD-10-CM | POA: Diagnosis not present

## 2021-12-14 DIAGNOSIS — E1122 Type 2 diabetes mellitus with diabetic chronic kidney disease: Secondary | ICD-10-CM | POA: Diagnosis present

## 2021-12-14 DIAGNOSIS — Z9049 Acquired absence of other specified parts of digestive tract: Secondary | ICD-10-CM | POA: Diagnosis not present

## 2021-12-14 DIAGNOSIS — E669 Obesity, unspecified: Secondary | ICD-10-CM | POA: Diagnosis present

## 2021-12-14 DIAGNOSIS — Z9181 History of falling: Secondary | ICD-10-CM

## 2021-12-14 DIAGNOSIS — Y92238 Other place in hospital as the place of occurrence of the external cause: Secondary | ICD-10-CM | POA: Diagnosis not present

## 2021-12-14 DIAGNOSIS — E1151 Type 2 diabetes mellitus with diabetic peripheral angiopathy without gangrene: Secondary | ICD-10-CM | POA: Diagnosis not present

## 2021-12-14 DIAGNOSIS — Z91048 Other nonmedicinal substance allergy status: Secondary | ICD-10-CM

## 2021-12-14 DIAGNOSIS — Z4682 Encounter for fitting and adjustment of non-vascular catheter: Secondary | ICD-10-CM | POA: Diagnosis not present

## 2021-12-14 DIAGNOSIS — R569 Unspecified convulsions: Principal | ICD-10-CM

## 2021-12-14 DIAGNOSIS — J634 Siderosis: Secondary | ICD-10-CM | POA: Diagnosis present

## 2021-12-14 DIAGNOSIS — Z8619 Personal history of other infectious and parasitic diseases: Secondary | ICD-10-CM

## 2021-12-14 DIAGNOSIS — I499 Cardiac arrhythmia, unspecified: Secondary | ICD-10-CM | POA: Diagnosis not present

## 2021-12-14 DIAGNOSIS — Z880 Allergy status to penicillin: Secondary | ICD-10-CM

## 2021-12-14 DIAGNOSIS — Z634 Disappearance and death of family member: Secondary | ICD-10-CM

## 2021-12-14 DIAGNOSIS — N184 Chronic kidney disease, stage 4 (severe): Secondary | ICD-10-CM | POA: Diagnosis not present

## 2021-12-14 DIAGNOSIS — Z743 Need for continuous supervision: Secondary | ICD-10-CM | POA: Diagnosis not present

## 2021-12-14 DIAGNOSIS — R011 Cardiac murmur, unspecified: Secondary | ICD-10-CM | POA: Diagnosis present

## 2021-12-14 DIAGNOSIS — Z882 Allergy status to sulfonamides status: Secondary | ICD-10-CM

## 2021-12-14 DIAGNOSIS — G4730 Sleep apnea, unspecified: Secondary | ICD-10-CM | POA: Diagnosis present

## 2021-12-14 DIAGNOSIS — E875 Hyperkalemia: Secondary | ICD-10-CM | POA: Diagnosis not present

## 2021-12-14 DIAGNOSIS — D649 Anemia, unspecified: Secondary | ICD-10-CM | POA: Diagnosis not present

## 2021-12-14 DIAGNOSIS — E872 Acidosis, unspecified: Secondary | ICD-10-CM | POA: Diagnosis present

## 2021-12-14 DIAGNOSIS — K219 Gastro-esophageal reflux disease without esophagitis: Secondary | ICD-10-CM | POA: Diagnosis present

## 2021-12-14 DIAGNOSIS — E870 Hyperosmolality and hypernatremia: Secondary | ICD-10-CM | POA: Diagnosis not present

## 2021-12-14 DIAGNOSIS — Z961 Presence of intraocular lens: Secondary | ICD-10-CM | POA: Diagnosis present

## 2021-12-14 DIAGNOSIS — I952 Hypotension due to drugs: Secondary | ICD-10-CM | POA: Diagnosis present

## 2021-12-14 DIAGNOSIS — D631 Anemia in chronic kidney disease: Secondary | ICD-10-CM | POA: Diagnosis not present

## 2021-12-14 DIAGNOSIS — R29818 Other symptoms and signs involving the nervous system: Secondary | ICD-10-CM | POA: Diagnosis not present

## 2021-12-14 DIAGNOSIS — R001 Bradycardia, unspecified: Secondary | ICD-10-CM | POA: Diagnosis not present

## 2021-12-14 DIAGNOSIS — I6523 Occlusion and stenosis of bilateral carotid arteries: Secondary | ICD-10-CM | POA: Diagnosis not present

## 2021-12-14 DIAGNOSIS — G9341 Metabolic encephalopathy: Secondary | ICD-10-CM | POA: Diagnosis not present

## 2021-12-14 DIAGNOSIS — Z452 Encounter for adjustment and management of vascular access device: Secondary | ICD-10-CM | POA: Diagnosis not present

## 2021-12-14 DIAGNOSIS — F419 Anxiety disorder, unspecified: Secondary | ICD-10-CM | POA: Diagnosis present

## 2021-12-14 DIAGNOSIS — Z90710 Acquired absence of both cervix and uterus: Secondary | ICD-10-CM

## 2021-12-14 DIAGNOSIS — Z923 Personal history of irradiation: Secondary | ICD-10-CM

## 2021-12-14 DIAGNOSIS — Z87442 Personal history of urinary calculi: Secondary | ICD-10-CM

## 2021-12-14 DIAGNOSIS — Z800 Family history of malignant neoplasm of digestive organs: Secondary | ICD-10-CM

## 2021-12-14 DIAGNOSIS — M109 Gout, unspecified: Secondary | ICD-10-CM | POA: Diagnosis present

## 2021-12-14 DIAGNOSIS — E8770 Fluid overload, unspecified: Secondary | ICD-10-CM | POA: Diagnosis present

## 2021-12-14 DIAGNOSIS — M545 Low back pain, unspecified: Secondary | ICD-10-CM | POA: Diagnosis present

## 2021-12-14 DIAGNOSIS — Z90722 Acquired absence of ovaries, bilateral: Secondary | ICD-10-CM

## 2021-12-14 DIAGNOSIS — Z6835 Body mass index (BMI) 35.0-35.9, adult: Secondary | ICD-10-CM

## 2021-12-14 DIAGNOSIS — R404 Transient alteration of awareness: Secondary | ICD-10-CM | POA: Diagnosis not present

## 2021-12-14 DIAGNOSIS — Z8542 Personal history of malignant neoplasm of other parts of uterus: Secondary | ICD-10-CM

## 2021-12-14 DIAGNOSIS — I509 Heart failure, unspecified: Secondary | ICD-10-CM | POA: Diagnosis not present

## 2021-12-14 DIAGNOSIS — Z823 Family history of stroke: Secondary | ICD-10-CM

## 2021-12-14 DIAGNOSIS — I672 Cerebral atherosclerosis: Secondary | ICD-10-CM | POA: Diagnosis not present

## 2021-12-14 DIAGNOSIS — Z9842 Cataract extraction status, left eye: Secondary | ICD-10-CM

## 2021-12-14 DIAGNOSIS — A419 Sepsis, unspecified organism: Secondary | ICD-10-CM | POA: Diagnosis present

## 2021-12-14 LAB — APTT: aPTT: 32 seconds (ref 24–36)

## 2021-12-14 LAB — BASIC METABOLIC PANEL
Anion gap: 9 (ref 5–15)
BUN: 72 mg/dL — ABNORMAL HIGH (ref 8–23)
CO2: 20 mmol/L — ABNORMAL LOW (ref 22–32)
Calcium: 8.4 mg/dL — ABNORMAL LOW (ref 8.9–10.3)
Chloride: 112 mmol/L — ABNORMAL HIGH (ref 98–111)
Creatinine, Ser: 1.77 mg/dL — ABNORMAL HIGH (ref 0.44–1.00)
GFR, Estimated: 30 mL/min — ABNORMAL LOW (ref >60)
Glucose, Bld: 36 mg/dL — CL (ref 70–99)
Potassium: 3.5 mmol/L (ref 3.5–5.1)
Sodium: 141 mmol/L (ref 135–145)

## 2021-12-14 LAB — RAPID URINE DRUG SCREEN, HOSP PERFORMED
Amphetamines: NOT DETECTED
Barbiturates: NOT DETECTED
Benzodiazepines: NOT DETECTED
Cocaine: NOT DETECTED
Opiates: NOT DETECTED
Tetrahydrocannabinol: NOT DETECTED

## 2021-12-14 LAB — COMPREHENSIVE METABOLIC PANEL
ALT: 29 U/L (ref 0–44)
AST: 40 U/L (ref 15–41)
Albumin: 3.1 g/dL — ABNORMAL LOW (ref 3.5–5.0)
Alkaline Phosphatase: 121 U/L (ref 38–126)
Anion gap: 10 (ref 5–15)
BUN: 83 mg/dL — ABNORMAL HIGH (ref 8–23)
CO2: 20 mmol/L — ABNORMAL LOW (ref 22–32)
Calcium: 9.2 mg/dL (ref 8.9–10.3)
Chloride: 109 mmol/L (ref 98–111)
Creatinine, Ser: 2.05 mg/dL — ABNORMAL HIGH (ref 0.44–1.00)
GFR, Estimated: 25 mL/min — ABNORMAL LOW (ref >60)
Glucose, Bld: 128 mg/dL — ABNORMAL HIGH (ref 70–99)
Potassium: 4.5 mmol/L (ref 3.5–5.1)
Sodium: 139 mmol/L (ref 135–145)
Total Bilirubin: 1 mg/dL (ref 0.3–1.2)
Total Protein: 6.9 g/dL (ref 6.5–8.1)

## 2021-12-14 LAB — DIFFERENTIAL
Abs Immature Granulocytes: 0.03 10*3/uL (ref 0.00–0.07)
Basophils Absolute: 0.1 10*3/uL (ref 0.0–0.1)
Basophils Relative: 1 %
Eosinophils Absolute: 0.1 10*3/uL (ref 0.0–0.5)
Eosinophils Relative: 1 %
Immature Granulocytes: 0 %
Lymphocytes Relative: 25 %
Lymphs Abs: 2.6 10*3/uL (ref 0.7–4.0)
Monocytes Absolute: 0.7 10*3/uL (ref 0.1–1.0)
Monocytes Relative: 7 %
Neutro Abs: 6.6 10*3/uL (ref 1.7–7.7)
Neutrophils Relative %: 66 %

## 2021-12-14 LAB — I-STAT CHEM 8, ED
BUN: 90 mg/dL — ABNORMAL HIGH (ref 8–23)
Calcium, Ion: 1.18 mmol/L (ref 1.15–1.40)
Chloride: 108 mmol/L (ref 98–111)
Creatinine, Ser: 2.3 mg/dL — ABNORMAL HIGH (ref 0.44–1.00)
Glucose, Bld: 122 mg/dL — ABNORMAL HIGH (ref 70–99)
HCT: 29 % — ABNORMAL LOW (ref 36.0–46.0)
Hemoglobin: 9.9 g/dL — ABNORMAL LOW (ref 12.0–15.0)
Potassium: 4.4 mmol/L (ref 3.5–5.1)
Sodium: 141 mmol/L (ref 135–145)
TCO2: 21 mmol/L — ABNORMAL LOW (ref 22–32)

## 2021-12-14 LAB — URINALYSIS, ROUTINE W REFLEX MICROSCOPIC
Bilirubin Urine: NEGATIVE
Glucose, UA: NEGATIVE mg/dL
Hgb urine dipstick: NEGATIVE
Ketones, ur: NEGATIVE mg/dL
Nitrite: NEGATIVE
Protein, ur: >=300 mg/dL — AB
Specific Gravity, Urine: 1.02 (ref 1.005–1.030)
pH: 6 (ref 5.0–8.0)

## 2021-12-14 LAB — ETHANOL: Alcohol, Ethyl (B): <10 mg/dL (ref <10)

## 2021-12-14 LAB — BLOOD GAS, ARTERIAL
Acid-Base Excess: 0.4 mmol/L (ref 0.0–2.0)
Bicarbonate: 22 mmol/L (ref 20.0–28.0)
Drawn by: 22179
FIO2: 100 %
O2 Saturation: 99.6 %
Patient temperature: 36.4
pCO2 arterial: 26 mmHg — ABNORMAL LOW (ref 32–48)
pH, Arterial: 7.53 — ABNORMAL HIGH (ref 7.35–7.45)
pO2, Arterial: 456 mmHg — ABNORMAL HIGH (ref 83–108)

## 2021-12-14 LAB — CBC
HCT: 31.3 % — ABNORMAL LOW (ref 36.0–46.0)
Hemoglobin: 11.1 g/dL — ABNORMAL LOW (ref 12.0–15.0)
MCH: 32.6 pg (ref 26.0–34.0)
MCHC: 35.5 g/dL (ref 30.0–36.0)
MCV: 91.8 fL (ref 80.0–100.0)
Platelets: 226 10*3/uL (ref 150–400)
RBC: 3.41 MIL/uL — ABNORMAL LOW (ref 3.87–5.11)
RDW: 13.8 % (ref 11.5–15.5)
WBC: 10.1 10*3/uL (ref 4.0–10.5)
nRBC: 0 % (ref 0.0–0.2)

## 2021-12-14 LAB — RESP PANEL BY RT-PCR (FLU A&B, COVID) ARPGX2
Influenza A by PCR: NEGATIVE
Influenza B by PCR: NEGATIVE
SARS Coronavirus 2 by RT PCR: NEGATIVE

## 2021-12-14 LAB — MAGNESIUM
Magnesium: 2 mg/dL (ref 1.7–2.4)
Magnesium: 1.9 mg/dL (ref 1.7–2.4)

## 2021-12-14 LAB — PROTIME-INR
INR: 1.1 (ref 0.8–1.2)
Prothrombin Time: 14.5 seconds (ref 11.4–15.2)

## 2021-12-14 LAB — LACTIC ACID, PLASMA: Lactic Acid, Venous: 1.2 mmol/L (ref 0.5–1.9)

## 2021-12-14 LAB — CBG MONITORING, ED: Glucose-Capillary: 112 mg/dL — ABNORMAL HIGH (ref 70–99)

## 2021-12-14 LAB — GLUCOSE, CAPILLARY
Glucose-Capillary: 140 mg/dL — ABNORMAL HIGH (ref 70–99)
Glucose-Capillary: 27 mg/dL — CL (ref 70–99)

## 2021-12-14 MED ORDER — LEVETIRACETAM IN NACL 1000 MG/100ML IV SOLN
1000.0000 mg | INTRAVENOUS | Status: AC
  Administered 2021-12-14 (×2): 1000 mg via INTRAVENOUS
  Filled 2021-12-14 (×2): qty 100

## 2021-12-14 MED ORDER — SODIUM CHLORIDE 0.9 % IV BOLUS
1000.0000 mL | Freq: Once | INTRAVENOUS | Status: AC
  Administered 2021-12-14: 1000 mL via INTRAVENOUS

## 2021-12-14 MED ORDER — POLYETHYLENE GLYCOL 3350 17 G PO PACK
17.0000 g | PACK | Freq: Every day | ORAL | Status: DC
  Administered 2021-12-15 – 2021-12-25 (×10): 17 g
  Filled 2021-12-14 (×11): qty 1

## 2021-12-14 MED ORDER — DEXTROSE 50 % IV SOLN
INTRAVENOUS | Status: AC
  Filled 2021-12-14: qty 50

## 2021-12-14 MED ORDER — SODIUM CHLORIDE 0.9 % IV SOLN
250.0000 mL | INTRAVENOUS | Status: DC
  Administered 2021-12-18: 250 mL via INTRAVENOUS

## 2021-12-14 MED ORDER — LORAZEPAM 2 MG/ML IJ SOLN
1.0000 mg | Freq: Once | INTRAMUSCULAR | Status: AC
  Administered 2021-12-14: 1 mg via INTRAVENOUS
  Filled 2021-12-14: qty 1

## 2021-12-14 MED ORDER — FENTANYL CITRATE PF 50 MCG/ML IJ SOSY
25.0000 ug | PREFILLED_SYRINGE | INTRAMUSCULAR | Status: DC | PRN
  Administered 2021-12-14: 25 ug via INTRAVENOUS
  Filled 2021-12-14: qty 1

## 2021-12-14 MED ORDER — FENTANYL BOLUS VIA INFUSION
25.0000 ug | INTRAVENOUS | Status: DC | PRN
  Administered 2021-12-15: 25 ug via INTRAVENOUS

## 2021-12-14 MED ORDER — NOREPINEPHRINE 4 MG/250ML-% IV SOLN
0.0000 ug/min | INTRAVENOUS | Status: DC
  Administered 2021-12-14: 2 ug/min via INTRAVENOUS
  Administered 2021-12-15: 17 ug/min via INTRAVENOUS
  Administered 2021-12-15: 16 ug/min via INTRAVENOUS
  Administered 2021-12-15: 9 ug/min via INTRAVENOUS
  Administered 2021-12-15: 17 ug/min via INTRAVENOUS
  Administered 2021-12-16: 10 ug/min via INTRAVENOUS
  Administered 2021-12-16: 17 ug/min via INTRAVENOUS
  Administered 2021-12-16: 13 ug/min via INTRAVENOUS
  Administered 2021-12-16: 17 ug/min via INTRAVENOUS
  Administered 2021-12-17: 10 ug/min via INTRAVENOUS
  Filled 2021-12-14 (×10): qty 250

## 2021-12-14 MED ORDER — LORAZEPAM 2 MG/ML IJ SOLN: 2.0000 mg | INTRAMUSCULAR | Status: DC | PRN

## 2021-12-14 MED ORDER — IOHEXOL 350 MG/ML SOLN
100.0000 mL | Freq: Once | INTRAVENOUS | Status: AC | PRN
  Administered 2021-12-14: 100 mL via INTRAVENOUS

## 2021-12-14 MED ORDER — PROPOFOL 1000 MG/100ML IV EMUL
0.0000 ug/kg/min | INTRAVENOUS | Status: DC
  Administered 2021-12-14: 5 ug/kg/min via INTRAVENOUS
  Filled 2021-12-14: qty 100

## 2021-12-14 MED ORDER — DOCUSATE SODIUM 50 MG/5ML PO LIQD
100.0000 mg | Freq: Two times a day (BID) | ORAL | Status: DC
  Administered 2021-12-15 – 2021-12-27 (×22): 100 mg
  Filled 2021-12-14 (×24): qty 10

## 2021-12-14 MED ORDER — ORAL CARE MOUTH RINSE: 15.0000 mL | OROMUCOSAL | Status: DC | PRN

## 2021-12-14 MED ORDER — FENTANYL CITRATE PF 50 MCG/ML IJ SOSY
25.0000 ug | PREFILLED_SYRINGE | Freq: Once | INTRAMUSCULAR | Status: AC
  Administered 2021-12-14: 25 ug via INTRAVENOUS
  Filled 2021-12-14: qty 1

## 2021-12-14 MED ORDER — ORAL CARE MOUTH RINSE
15.0000 mL | OROMUCOSAL | Status: DC
  Administered 2021-12-15 – 2021-12-17 (×30): 15 mL via OROMUCOSAL

## 2021-12-14 MED ORDER — NOREPINEPHRINE 4 MG/250ML-% IV SOLN
INTRAVENOUS | Status: AC
  Filled 2021-12-14: qty 250

## 2021-12-14 MED ORDER — MIDAZOLAM HCL 2 MG/2ML IJ SOLN: 1.0000 mg | INTRAMUSCULAR | Status: DC | PRN

## 2021-12-14 MED ORDER — PHENYLEPHRINE 80 MCG/ML (10ML) SYRINGE FOR IV PUSH (FOR BLOOD PRESSURE SUPPORT)
PREFILLED_SYRINGE | INTRAVENOUS | Status: AC
  Filled 2021-12-14: qty 10

## 2021-12-14 MED ORDER — ETOMIDATE 2 MG/ML IV SOLN
INTRAVENOUS | Status: AC
  Administered 2021-12-14: 20 mg
  Filled 2021-12-14: qty 20

## 2021-12-14 MED ORDER — PANTOPRAZOLE 2 MG/ML SUSPENSION
40.0000 mg | Freq: Every day | ORAL | Status: DC
  Administered 2021-12-15 – 2021-12-27 (×13): 40 mg
  Filled 2021-12-14 (×12): qty 20

## 2021-12-14 MED ORDER — PANTOPRAZOLE 2 MG/ML SUSPENSION
40.0000 mg | Freq: Every day | ORAL | Status: DC
  Filled 2021-12-14: qty 20

## 2021-12-14 MED ORDER — FENTANYL BOLUS VIA INFUSION: 25.0000 ug | INTRAVENOUS | Status: DC | PRN

## 2021-12-14 MED ORDER — INSULIN ASPART 100 UNIT/ML IJ SOLN
0.0000 [IU] | INTRAMUSCULAR | Status: DC
  Administered 2021-12-15 (×5): 2 [IU] via SUBCUTANEOUS
  Administered 2021-12-16: 3 [IU] via SUBCUTANEOUS
  Administered 2021-12-16: 2 [IU] via SUBCUTANEOUS
  Administered 2021-12-16: 3 [IU] via SUBCUTANEOUS
  Administered 2021-12-16: 2 [IU] via SUBCUTANEOUS
  Administered 2021-12-16: 3 [IU] via SUBCUTANEOUS
  Administered 2021-12-17 (×2): 2 [IU] via SUBCUTANEOUS
  Administered 2021-12-17: 3 [IU] via SUBCUTANEOUS
  Administered 2021-12-17 – 2021-12-18 (×3): 2 [IU] via SUBCUTANEOUS
  Administered 2021-12-18 – 2021-12-19 (×4): 3 [IU] via SUBCUTANEOUS
  Administered 2021-12-19 (×3): 2 [IU] via SUBCUTANEOUS
  Administered 2021-12-20: 3 [IU] via SUBCUTANEOUS
  Administered 2021-12-20: 2 [IU] via SUBCUTANEOUS
  Administered 2021-12-20 (×3): 3 [IU] via SUBCUTANEOUS
  Administered 2021-12-20: 2 [IU] via SUBCUTANEOUS
  Administered 2021-12-21 – 2021-12-23 (×13): 3 [IU] via SUBCUTANEOUS
  Administered 2021-12-23: 2 [IU] via SUBCUTANEOUS
  Administered 2021-12-23 (×3): 3 [IU] via SUBCUTANEOUS
  Administered 2021-12-23 – 2021-12-24 (×2): 2 [IU] via SUBCUTANEOUS
  Administered 2021-12-24: 3 [IU] via SUBCUTANEOUS
  Administered 2021-12-24: 2 [IU] via SUBCUTANEOUS
  Administered 2021-12-24: 3 [IU] via SUBCUTANEOUS
  Administered 2021-12-25: 2 [IU] via SUBCUTANEOUS
  Administered 2021-12-25: 1 [IU] via SUBCUTANEOUS
  Administered 2021-12-25: 2 [IU] via SUBCUTANEOUS
  Administered 2021-12-25: 5 [IU] via SUBCUTANEOUS
  Administered 2021-12-25 – 2021-12-26 (×4): 2 [IU] via SUBCUTANEOUS
  Administered 2021-12-26 (×2): 3 [IU] via SUBCUTANEOUS
  Administered 2021-12-26 – 2021-12-27 (×3): 2 [IU] via SUBCUTANEOUS
  Administered 2021-12-27: 3 [IU] via SUBCUTANEOUS
  Administered 2021-12-28 (×2): 1 [IU] via SUBCUTANEOUS

## 2021-12-14 MED ORDER — FENTANYL 2500MCG IN NS 250ML (10MCG/ML) PREMIX INFUSION
25.0000 ug/h | INTRAVENOUS | Status: DC
  Administered 2021-12-14: 25 ug/h via INTRAVENOUS
  Filled 2021-12-14: qty 250

## 2021-12-14 MED ORDER — ORAL CARE MOUTH RINSE: 15.0000 mL | OROMUCOSAL | Status: DC

## 2021-12-14 MED ORDER — LEVETIRACETAM IN NACL 500 MG/100ML IV SOLN
500.0000 mg | Freq: Two times a day (BID) | INTRAVENOUS | Status: DC
  Administered 2021-12-14 – 2021-12-17 (×6): 500 mg via INTRAVENOUS
  Filled 2021-12-14 (×6): qty 100

## 2021-12-14 MED ORDER — DEXTROSE 50 % IV SOLN
25.0000 g | INTRAVENOUS | Status: AC
  Administered 2021-12-14: 25 g via INTRAVENOUS

## 2021-12-14 MED ORDER — SODIUM CHLORIDE 0.9 % IV SOLN: INTRAVENOUS | Status: DC

## 2021-12-14 MED ORDER — CHLORHEXIDINE GLUCONATE CLOTH 2 % EX PADS
6.0000 | MEDICATED_PAD | Freq: Every day | CUTANEOUS | Status: DC
  Administered 2021-12-15 – 2021-12-18 (×6): 6 via TOPICAL

## 2021-12-14 MED ORDER — FENTANYL 2500MCG IN NS 250ML (10MCG/ML) PREMIX INFUSION
25.0000 ug/h | INTRAVENOUS | Status: DC
  Administered 2021-12-15: 25 ug/h via INTRAVENOUS
  Administered 2021-12-16: 100 ug/h via INTRAVENOUS
  Filled 2021-12-14: qty 250

## 2021-12-14 MED ORDER — ROCURONIUM BROMIDE 10 MG/ML (PF) SYRINGE
PREFILLED_SYRINGE | INTRAVENOUS | Status: AC
  Administered 2021-12-14: 100 mg
  Filled 2021-12-14: qty 10

## 2021-12-14 MED ORDER — ATROPINE SULFATE 1 MG/10ML IJ SOSY
0.5000 mg | PREFILLED_SYRINGE | Freq: Once | INTRAMUSCULAR | Status: DC
  Filled 2021-12-14 (×2): qty 10

## 2021-12-14 MED ORDER — SODIUM CHLORIDE 0.9 % IV SOLN: 2000.0000 mg | Freq: Once | INTRAVENOUS | Status: DC

## 2021-12-14 MED ORDER — FENTANYL CITRATE PF 50 MCG/ML IJ SOSY
25.0000 ug | PREFILLED_SYRINGE | INTRAMUSCULAR | Status: DC | PRN
  Administered 2021-12-14 (×2): 50 ug via INTRAVENOUS
  Filled 2021-12-14 (×2): qty 1

## 2021-12-14 MED ORDER — DEXTROSE 10 % IV SOLN: INTRAVENOUS | Status: DC

## 2021-12-14 NOTE — ED Provider Notes (Signed)
[facility] EMERGENCY DEPARTMENT Provider Note   CSN: [id number] Arrival date & time: 12/14/21  1400  An emergency department physician performed an initial assessment on this suspected stroke patient at 1405.  History  Chief Complaint  Patient presents with   Code Stroke    Elizabeth Crosby is a 76 y.o. female.  HPI 76 year old female presents as a code stroke.  History is initially from EMS.  Nursing facility called EMS due to acute altered mental status.  Last known well seems to be 10:30 AM.  Paramedic reports that the patient initially had some drooling out of the left side of her mouth.  At first she seemed to be pleasantly confused but was following commands. In transport she had about 2 minutes of seizure like activity. Head turned to left and her left side stiffened. No meds given, seizure was over prior to giving any meds. EMS upgraded her to a code stroke and brought her in emergency traffic. Patient is unable to provide any significant history at this time.   Home Medications Prior to Admission medications   Medication Sig Start Date End Date Taking? Authorizing Provider  acetaminophen (TYLENOL) 500 MG tablet Take 1 tablet (500 mg total) by mouth every 4 (four) hours as needed for moderate pain, headache or fever. 11/25/20   Pokhrel, Corrie Mckusick, MD  amLODipine (NORVASC) 5 MG tablet Take 5 mg by mouth daily.    [provider]  ascorbic acid (VITAMIN C) 500 MG tablet Take 500 mg by mouth daily.    [provider]  aspirin EC 81 MG tablet Take 81 mg by mouth daily.    [provider]  ceFAZolin (ANCEF) IVPB Inject 2 g into the vein every 12 (twelve) hours. End date 10/19/21 Patient not taking: Reported on 11/26/2021    [provider]  cetirizine (ZYRTEC) 10 MG tablet Take 10 mg by mouth daily.     [provider]  Cholecalciferol 1.25 MG (50000 UT) capsule Take 50,000 Units by mouth daily.    [provider]  diclofenac Sodium  (VOLTAREN) 1 % GEL Apply 2 g topically 2 (two) times daily as needed (pain).    [provider]  famotidine (PEPCID) 20 MG tablet Take 20 mg by mouth 2 (two) times daily. 08/23/21   [provider]  fenofibrate 160 MG tablet Take 160 mg by mouth every morning.     [provider]  ferrous sulfate 325 (65 FE) MG tablet Take 325 mg by mouth daily with breakfast.    [provider]  furosemide (LASIX) 20 MG tablet Take 20 mg by mouth 2 (two) times daily. 09/28/21   [provider]  Insulin Aspart (NOVOLOG FLEXPEN [place]) Inject 10-20 Units into the skin See admin instructions. 10 units at breakfast, 10 units at lunch, 20 units at dinner    [provider]  insulin degludec (TRESIBA) 200 UNIT/ML FlexTouch Pen Inject 40 Units into the skin daily with breakfast.    [provider]  ipratropium (ATROVENT) 0.06 % nasal spray Place 2 sprays into both nostrils 3 (three) times daily as needed for allergies. 08/10/21   [provider]  lipase/protease/amylase (CREON) 36000 UNITS CPEP capsule Take 36,000-72,000 Units by mouth See admin instructions. 2 capsules (72000 units) with each meal, 1 capsules (36000 units) with each snack    [provider]  metoprolol succinate (TOPROL-XL) 25 MG 24 hr tablet Take 25 mg by mouth daily. 09/10/21   [provider]  omeprazole (PRILOSEC) 20 MG capsule Take 20 mg by mouth daily. 08/19/18   [provider]  simvastatin (ZOCOR) 20 MG tablet Take 20 mg by mouth daily.    [provider]  tiZANidine (ZANAFLEX) 2 MG tablet Take 0.5-1 tablets (1-2 mg total) by mouth every 8 (eight) hours as needed for muscle spasms. 10/09/21   Darliss Cheney, MD  traMADol (ULTRAM) 50 MG tablet Take 1 tablet (50 mg total) by mouth every 12 (twelve) hours as needed for severe pain or moderate pain. 10/09/21 10/09/22  Darliss Cheney, MD  vitamin B-12 (CYANOCOBALAMIN) 250 MCG tablet Take 250 mcg by mouth daily.     [provider]      Allergies    Ozempic (0.25 or 0.5 mg-dose) [semaglutide(0.25 or 0.'5mg'$ -dos)], Amoxil [amoxicillin], Glucophage [metformin], Invokana [canagliflozin], Lipitor [atorvastatin], Lyrica cr [pregabalin er], Metformin and related, Other, Sulfa antibiotics, Trulicity [dulaglutide], and Potassium-containing compounds    Review of Systems   Review of Systems  Unable to perform ROS: Mental status change    Physical Exam Updated Vital Signs There were no vitals taken for this visit. Physical Exam Vitals and nursing note reviewed.  Constitutional:      Appearance: She is well-developed.  HENT:     Head: Normocephalic and atraumatic.     Mouth/Throat:     Comments: Small distal tongue abrasion Eyes:     Pupils: Pupils are equal, round, and reactive to light.     Comments: No eye deviation  Cardiovascular:     Rate and Rhythm: Normal rate and regular rhythm.     Heart sounds: Normal heart sounds.  Pulmonary:     Effort: Pulmonary effort is normal.  Abdominal:     General: There is no distension.     Palpations: Abdomen is soft.  Musculoskeletal:     Cervical back: Neck supple. No rigidity.  Skin:    General: Skin is warm and dry.  Neurological:     Mental Status: She is alert.     Comments: Patient is lethargic.  At first, she is resisting all movements when I try to move extremities and later seems to spontaneously move all 4 extremities to try and pull things off of her but otherwise does not follow commands.  Does not open eyes unless painful stimuli is applied.  Otherwise seems to be moaning and is not talking.     ED Results / Procedures / Treatments   Labs (all labs ordered are listed, but only abnormal results are displayed) Labs Reviewed  CBC - Abnormal; Notable for the following components:      Result Value   RBC 3.41 (*)    Hemoglobin 11.1 (*)    HCT 31.3 (*)    All other components within normal limits  COMPREHENSIVE METABOLIC PANEL -  Abnormal; Notable for the following components:   CO2 20 (*)    Glucose, Bld 128 (*)    BUN 83 (*)    Creatinine, Ser 2.05 (*)    Albumin 3.1 (*)    GFR, Estimated 25 (*)    All other components within normal limits  I-STAT CHEM 8, ED - Abnormal; Notable for the following components:   BUN 90 (*)    Creatinine, Ser 2.30 (*)    Glucose, Bld 122 (*)    TCO2 21 (*)    Hemoglobin 9.9 (*)    HCT 29.0 (*)    All other components within normal limits  CBG MONITORING, ED - Abnormal;  Notable for the following components:   Glucose-Capillary 112 (*)    All other components within normal limits  RESP PANEL BY RT-PCR (FLU A&B, COVID) ARPGX2  PROTIME-INR  APTT  DIFFERENTIAL  ETHANOL  RAPID URINE DRUG SCREEN, HOSP PERFORMED  URINALYSIS, ROUTINE W REFLEX MICROSCOPIC  BLOOD GAS, ARTERIAL    EKG None  Radiology DG Abd 1 View  Result Date: 12/14/2021 CLINICAL DATA:  Orogastric tube placement. EXAM: ABDOMEN - 1 VIEW COMPARISON:  None Available. FINDINGS: Distal tip of nasogastric tube is seen in expected position of distal stomach. IMPRESSION: Distal tip of nasogastric tube seen in expected position of distal stomach. Electronically Signed   By: Marijo Conception M.D.   On: 12/14/2021 15:13   DG Chest Portable 1 View  Result Date: 12/14/2021 CLINICAL DATA:  Endotracheal tube. EXAM: PORTABLE CHEST 1 VIEW COMPARISON:  October 04, 2021. FINDINGS: Stable cardiomediastinal silhouette. Endotracheal tube is in grossly good position. Nasogastric tube is seen entering stomach. Lungs are clear. Bony thorax is unremarkable. IMPRESSION: Endotracheal and nasogastric tubes are in grossly good position. No acute cardiopulmonary abnormality is noted. Electronically Signed   By: Marijo Conception M.D.   On: 12/14/2021 15:13   CT HEAD CODE STROKE WO CONTRAST  Result Date: 12/14/2021 CLINICAL DATA:  Code stroke.  Acute neuro deficit. EXAM: CT HEAD WITHOUT CONTRAST TECHNIQUE: Contiguous axial images were obtained from  the base of the skull through the vertex without intravenous contrast. RADIATION DOSE REDUCTION: This exam was performed according to the departmental dose-optimization program which includes automated exposure control, adjustment of the mA and/or kV according to patient size and/or use of iterative reconstruction technique. COMPARISON:  CT head 10/03/2021 FINDINGS: Brain: No evidence of acute infarction, hemorrhage, hydrocephalus, extra-axial collection or mass lesion/mass effect. Vascular: Negative for hyperdense vessel Skull: Negative Sinuses/Orbits: Retention cyst left maxillary sinus. Remaining sinuses clear. Orbit obscured by motion. Other: None ASPECTS ([place] Stroke Program Early CT Score) - Ganglionic level infarction (caudate, lentiform nuclei, internal capsule, insula, M1-M3 cortex): 7 - Supraganglionic infarction (M4-M6 cortex): 3 Total score (0-10 with 10 being normal): 10 IMPRESSION: 1. No acute intracranial abnormality 2. Motion degraded study 3. ASPECTS is 10 Electronically Signed   By: Franchot Gallo M.D.   On: 12/14/2021 14:45    Procedures Procedure Name: Intubation Date/Time: 12/14/2021 2:53 PM  Performed by: Sherwood Gambler, MDPre-anesthesia Checklist: Patient identified, Patient being monitored, Emergency Drugs available, Timeout performed and Suction available Oxygen Delivery Method: Non-rebreather mask Preoxygenation: Pre-oxygenation with 100% oxygen Induction Type: Rapid sequence Ventilation: Mask ventilation without difficulty Laryngoscope Size: Glidescope and 3 Grade View: Grade I Tube size: 7.5 mm Number of attempts: 1 Airway Equipment and Method: Video-laryngoscopy Placement Confirmation: ETT inserted through vocal cords under direct vision, CO2 detector and Breath sounds checked- equal and bilateral Secured at: 22 cm Tube secured with: ETT holder Dental Injury: Teeth and Oropharynx as per pre-operative assessment     .Critical Care  Performed by: Sherwood Gambler, MD Authorized by: Sherwood Gambler, MD   Critical care provider statement:    Critical care time (minutes):  45   Critical care time was exclusive of:  Separately billable procedures and treating other patients   Critical care was necessary to treat or prevent imminent or life-threatening deterioration of the following conditions:  Respiratory failure and CNS failure or compromise   Critical care was time spent personally by me on the following activities:  Development of treatment plan with patient or surrogate, discussions with consultants, evaluation  of patient's response to treatment, examination of patient, ordering and review of laboratory studies, ordering and review of radiographic studies, ordering and performing treatments and interventions, pulse oximetry, re-evaluation of patient's condition and review of old charts     Medications Ordered in ED Medications  levETIRAcetam (KEPPRA) IVPB 1000 mg/100 mL premix (has no administration in time range)  rocuronium bromide 100 MG/10ML SOSY (has no administration in time range)  etomidate (AMIDATE) 2 MG/ML injection (has no administration in time range)  fentaNYL (SUBLIMAZE) injection 25 mcg (has no administration in time range)  fentaNYL (SUBLIMAZE) injection 25-100 mcg (has no administration in time range)  midazolam (VERSED) injection 1 mg (has no administration in time range)  midazolam (VERSED) injection 1 mg (has no administration in time range)  sodium chloride 0.9 % bolus 1,000 mL (has no administration in time range)  LORazepam (ATIVAN) injection 1 mg (1 mg Intravenous Given 12/14/21 1417)  LORazepam (ATIVAN) injection 1 mg (1 mg Intravenous Given 12/14/21 1427)  iohexol (OMNIPAQUE) 350 MG/ML injection 100 mL (100 mLs Intravenous Contrast Given 12/14/21 1514)    ED Course/ Medical Decision Making/ A&P Clinical Course as of 12/14/21 1519  Fri Dec 14, 2021  1421 Dr. Leonel Ramsay called and is recommending CTA head and neck.   Seems less likely to be LVO.  Hold on the perfusion for now.  Would like 2g IV Keppra when she comes back from the scanner as well. [SG]  1452 Updated neuro that patient is intubated. He would like CT perfusion as well. [SG]  1512 Most recent Cr 2.0 last month. D/w Dr. Leonel Ramsay. He's still worried she's had aphasia and could be LVO. Recommends contrast studies if Cr less than 3.0 [SG]    Clinical Course User Index [SG] Sherwood Gambler, MD                           Medical Decision Making Amount and/or Complexity of Data Reviewed Labs: ordered. Radiology: ordered.  Risk Prescription drug management.   Patient is not currently seizing on arrival but is altered.  Does not really follow commands.  Taken to CT but unable to get good images due to patient's noncompliance and agitation.  She seems postictal.  Was given a couple doses of Ativan but still unable to get good images.  Given neurology is concerned about LVO, I think she needs to be intubated due to potentially critical illness and patient is unintentionally interfering with medical work-up.  She was intubated for airway protection.  Placed on fentanyl and Versed.  I updated her daughter over the phone as well.  As above, neurology is recommending CTA and perfusion. Will give keppra load.  If not an LVO, will still need admission to [facility] for EEG monitoring.  Care transferred to Dr. Tomi Bamberger with CTAs pending.   CXR interpreted by myself - ETT in good position.  CT head without head bleed.        Final Clinical Impression(s) / ED Diagnoses Final diagnoses:  Seizures ([place])  Acute respiratory failure, unspecified whether with hypoxia or hypercapnia [facility])    Rx / DC Orders ED Discharge Orders     None         Sherwood Gambler, MD 12/14/21 1600

## 2021-12-14 NOTE — Progress Notes (Signed)
Patient back to CT department to complete stroke imaging.

## 2021-12-14 NOTE — ED Provider Notes (Signed)
Case was discussed with Dr. Valeta Harms.  We will plan on admission to [facility], ICU   Dorie Rank, MD 12/14/21 1654 Patient initially had increasing agitation so she was placed on propofol and fentanyl infusion.  Patient initially hypertensive but now is having hypotension.  Heart rate also decreased.  We will stop all infusions.  Fluid bolus ordered.  We will give half milligram of atropine.  Patient's blood pressure did improve with IV hydration prior to transfer   Dorie Rank, MD 12/15/21 [phone]

## 2021-12-14 NOTE — ED Notes (Signed)
Elizabeth Bamberger, MD ordered to stop propofol.

## 2021-12-14 NOTE — ED Notes (Signed)
Patient FiO2 decreased to 40 , Bilateral rhonchi present after etsx. Waiting Transport.

## 2021-12-14 NOTE — ED Notes (Signed)
Tomi Bamberger, MD ordered to stop all infusions at this time.

## 2021-12-14 NOTE — Progress Notes (Signed)
[place] Progress Note Patient Name: Elizabeth Crosby DOB: November 04, 1945 MRN: [id number]   Date of Service  12/14/2021  HPI/Events of Note  Hypotension - BP  86/36 with MAP = 51 and HR  = 41.   eICU Interventions  Plan: Norepinephrine IV infusion via PIV. Titrate to MAP >= 65. 0.9 NaCl IV infusion at 100 mL/hour.      Intervention Category Major Interventions: Hypotension - evaluation and management  Smaran Gaus [place] 12/14/2021, 11:01 PM

## 2021-12-14 NOTE — ED Notes (Signed)
EMS called CODE STROKE @ 1350.  Beeped and Called CT and LAB @ 0230.

## 2021-12-14 NOTE — Progress Notes (Signed)
NCCT report finalized- no acute abn, AS 10- motion degraded. Dr Leonel Ramsay notified.

## 2021-12-14 NOTE — Progress Notes (Signed)
Patient back in room for prep to intubate. Unable to complete head imaging- Neurology, Dr Leonel Ramsay, notified. NCCT and CTA pending post intubation.

## 2021-12-14 NOTE — ED Notes (Signed)
Pt BP dropped to 66/30. Propofol decreased to 15 mcg, fentanyl increased to 50 mcg.

## 2021-12-14 NOTE — ED Notes (Signed)
Patient transported to CT on [facility] stretcher with Chalmers Guest, RN at bedside.

## 2021-12-14 NOTE — ED Notes (Signed)
Willis asked to hold atropine at this time.

## 2021-12-14 NOTE — ED Notes (Signed)
Pt still pulling at tubes and moving arms. Will increase propofol titration.

## 2021-12-14 NOTE — Progress Notes (Signed)
Report received from [facility]

## 2021-12-14 NOTE — Progress Notes (Signed)
Hypoglycemic Event  CBG: 27  Treatment: D50 50 mL (25 gm)  Symptoms: Nervous/irritable  Follow-up CBG: Time: 2348 CBG Result:140  Possible Reasons for Event: Unknown  Comments/MD notified: Dr. Oletta Darter notified, orders placed, continuing to monitor.    Vidal Schwalbe

## 2021-12-14 NOTE — ED Notes (Addendum)
Pt continues to fight sedation. Arms and legs are moving, reaching for tubes and pulling on IV's. Propofol increased to 20 mcg.

## 2021-12-14 NOTE — Progress Notes (Signed)
Patient back from [place], Neurologist notified.

## 2021-12-14 NOTE — ED Notes (Signed)
Pressure bag applied to fluid bolus.

## 2021-12-14 NOTE — H&P (Signed)
NAME:  Elizabeth Crosby, MRN:  [id number], DOB:  Sep 09, 1945, LOS: 0 ADMISSION DATE:  12/14/2021, CONSULTATION DATE: 9/15 REFERRING MD: Regenia Skeeter, CHIEF COMPLAINT: Seizure  History of Present Illness:  76 year old female patient with history as outlined below presented to the emergency room at [facility] On 9/15 when she was brought in from nursing home with altered mental status.  First noted earlier that a.m.  On EMS arrival she was found to be confused, drooling out of the left side of her mouth but following commands.  In route to the emergency room she had a witnessed episode lasting about 2 minutes of seizure-like activity.  She was intubated for airway protection.  A code stroke was called.  She was seen by telemetry neurology during that time was slightly agitated, CTA head was negative, she was negative for stroke.  She was felt at that time to be postictal from seizure.  As she was hypertensive neurology consultation included PRES on differential diagnosis.  She was loaded with Keppra, and transferred to [facility] for LTM EEG monitoring as well as plans for MRI of brain.  Critical care asked to admit.  Pertinent  Medical History  Hypertension, CKD stage IV glaucoma, gout, anxiety, insulin-dependent diabetes anemia, hyperlipidemia.  Poor dentition, falls at home  recent hospitalization from 7/5 to 7/11 for staph lugdunesis bacteremia, TEE was negative for vegetation.  Source not clear.  Course was complicated by acute encephalopathy as well as respiratory failure requiring ventilation.  Infectious disease follow-up felt source could potentially have been from a fall.  She completed 6 weeks of cefazolin. Significant Hospital Events: Including procedures, antibiotic start and stop dates in addition to other pertinent events   9/15 admitted with working diagnosis of seizure.  Seized in route to the emergency room and was unable to protect airway on arrival.  Intubated.  Code stroke  called.  CTA negative for stroke.  There is mild fusiform dilation of the internal carotid artery below the skull base bilaterally but this was negative for dissection.  Loaded with IV Keppra, transferred to [facility] for MRI of brain and LTM-EEG monitoring  Interim History / Subjective:  See above  Unable to obtain subjective evaluation due to patient status   Objective   Blood pressure (!) 141/106, pulse (!) 54, temperature 97.8 F (36.6 C), temperature source Axillary, resp. rate 20, height '5\' 1"'$  (1.549 m), weight 85.7 kg, SpO2 100 %.    Vent Mode: PRVC FiO2 (%):  [40 %-100 %] 40 % Set Rate:  [16 bmp-18 bmp] 18 bmp Vt Set:  [400 mL-500 mL] 400 mL PEEP:  [5 cmH20] 5 cmH20 Plateau Pressure:  [16 cmH20-17 cmH20] 16 cmH20  No intake or output data in the 24 hours ending 12/14/21 2157 Filed Weights   12/14/21 1804  Weight: 85.7 kg    General: In bed, intubated, obese, chronically ill appearing HEENT: MM pink/moist, anicteric, atraumatic Neuro: RASS -1, PERRL 46m, GCS 10T, MAE, purposeful CV: S1S2, SB, no m/r/g appreciated PULM:  clear in the upper lobes, clear in the lower lobes, trachea midline, chest expansion symmetric GI: soft, bsx4 active, non-tender   Extremities: warm/dry, no pretibial edema, capillary refill less than 3 seconds  Skin:  no rashes or lesions noted  Labs Ethanol within normal limits INR 1.1 Hemoglobin 11.1 WBC 10.1 Creatinine 2.5 baseline (1.7-1.9), BUN 83 (baseline 32-40) Albumin 3.1 UDS pending COVID flu negative ABG 7.53/26/456/22  Imaging CT head negative for acute intracranial abnormality, motion  graded per radiology Chest x-ray: No pneumothorax, no pleural effusion, no obvious infiltrate, stable ET tube KUB: NG tube in stomach CT angio head and neck: No significant carotid or vertebral artery stenosis in neck per radiology, mild fusiform dilation of internal carotid artery below the skull base bilaterally Twelve-lead: SR, RBBB-present on  previous ecg   Resolved Hospital Problem list     Assessment & Plan:  New onset seizure  Etiology not clear, CTA was negative did have some documented hypertension raising concern for PRES Loaded with Keppra in ER Plan -Management per neuro -Transfer to [facility] for LTM -MRI brain -Continue scheduled Keppra -Seizure precautions -Send blood and urine culture -Follow up UDS  Acute metabolic encephalopathy.   Likely postictal however also could consider PRES Plan -Supportive care -Treating seizures -BP control for now will shoot for SBP goal < 160 -Monitor neuro exams  Acute respiratory failure secondary to ineffective airway management requiring intubation Respiratory alkalosis, suspect secondary to overventilation on vent ABG 7.53/26/456/22 Plan -LTVV strategy with tidal volumes of 4-8 cc/kg ideal body weight -Goal plateau pressures less than 30 and driving pressures less than 15 -Wean PEEP/FiO2 for SpO2 92-98% -VAP bundle -Daily SAT and SBT -PAD bundle with fentanyl GTT, holding propofol at this time -RASS goal 0 to -1 -Repeat CXR and ABG on arrival to [facility].  Hypotension, BP 66/30 while in [facility] ED,  suspect secondary to propofol, afebrile, no significant tachycardia, no significant leukocytosis. Now normotensive History of hypertension.  Looks like peak recorded in the ER is 160 systolic  Plan -1093 ML IVF given in [place]. Goal MAP 65.  - Holding home antihypertensives -Check lactate  CKD stage IV Plan -Check CKs -Strict intake output -Serial chemistries -place foley  History of diabetes II on insulin Plan -Blood Glucose goal 140-180. -SSI  GERD -PPI  History of anemia Plan -Monitor   Best Practice (right click and "Reselect all SmartList Selections" daily)   Diet/type: NPO w/ meds via tube DVT prophylaxis: SCD GI prophylaxis: PPI Lines: N/A Foley:  Yes, and it is still needed Code Status:  full code Last date of multidisciplinary goals of care  discussion [pending]  Labs   CBC: Recent Labs  Lab 12/14/21 1400 12/14/21 1509  WBC 10.1  --   NEUTROABS 6.6  --   HGB 11.1* 9.9*  HCT 31.3* 29.0*  MCV 91.8  --   PLT 226  --     Basic Metabolic Panel: Recent Labs  Lab 12/14/21 1400 12/14/21 1509 12/14/21 1544  NA 139 141  --   K 4.5 4.4  --   CL 109 108  --   CO2 20*  --   --   GLUCOSE 128* 122*  --   BUN 83* 90*  --   CREATININE 2.05* 2.30*  --   CALCIUM 9.2  --   --   MG  --   --  2.0   GFR: Estimated Creatinine Clearance: 21 mL/min (A) (by C-G formula based on SCr of 2.3 mg/dL (H)). Recent Labs  Lab 12/14/21 1400  WBC 10.1    Liver Function Tests: Recent Labs  Lab 12/14/21 1400  AST 40  ALT 29  ALKPHOS 121  BILITOT 1.0  PROT 6.9  ALBUMIN 3.1*   No results for input(s): "LIPASE", "AMYLASE" in the last 168 hours. No results for input(s): "AMMONIA" in the last 168 hours.  ABG    Component Value Date/Time   PHART 7.53 (H) 12/14/2021 1618   PCO2ART 26 (  L) 12/14/2021 1618   PO2ART 456 (H) 12/14/2021 1618   HCO3 22.0 12/14/2021 1618   TCO2 21 (L) 12/14/2021 1509   ACIDBASEDEF 2.0 10/03/2021 0903   O2SAT 99.6 12/14/2021 1618     Coagulation Profile: Recent Labs  Lab 12/14/21 1400  INR 1.1    Cardiac Enzymes: No results for input(s): "CKTOTAL", "CKMB", "CKMBINDEX", "TROPONINI" in the last 168 hours.  HbA1C: Hgb A1c MFr Bld  Date/Time Value Ref Range Status  10/03/2021 10:18 AM 5.7 (H) 4.8 - 5.6 % Final    Comment:    (NOTE) Pre diabetes:          5.7%-6.4%  Diabetes:              >6.4%  Glycemic control for   <7.0% adults with diabetes   11/19/2020 05:03 AM 7.2 (H) 4.8 - 5.6 % Final    Comment:    (NOTE) Pre diabetes:          5.7%-6.4%  Diabetes:              >6.4%  Glycemic control for   <7.0% adults with diabetes     CBG: Recent Labs  Lab 12/14/21 1405  GLUCAP 112*    Review of Systems:   Unable to complete ROS due to patient status  Past Medical History:   She,  has a past medical history of Anxiety, Arthritis, Cellulitis, Chronic kidney disease (CKD), stage III (moderate) ([place]), Complication of anesthesia, Diabetes mellitus without complication ([place]), GERD (gastroesophageal reflux disease), Glaucoma, Gout, History of blood transfusion, History of kidney stones, History of radiation therapy, Hyperlipidemia, Hypertension, Leukocytes in urine, Lumbar back pain, and Sleep apnea.   Surgical History:   Past Surgical History:  Procedure Laterality Date   APPENDECTOMY  1972   CATARACT EXTRACTION W/PHACO Right 01/10/2014   Procedure: CATARACT EXTRACTION PHACO AND INTRAOCULAR LENS PLACEMENT ([place]);  Surgeon: Tonny Branch, MD;  Location: [facility]S;  Service: Ophthalmology;  Laterality: Right;  CDE:5.78   CATARACT EXTRACTION W/PHACO Left 01/27/2014   Procedure: CATARACT EXTRACTION PHACO AND INTRAOCULAR LENS PLACEMENT LEFT EYE CDE=5.75;  Surgeon: Tonny Branch, MD;  Location: [facility];  Service: Ophthalmology;  Laterality: Left;   CHOLECYSTECTOMY  1972   COLONOSCOPY WITH PROPOFOL N/A 09/01/2012   Procedure: COLONOSCOPY WITH PROPOFOL;  Surgeon: Garlan Fair, MD;  Location: [facility];  Service: Endoscopy;  Laterality: N/A;   KIDNEY STONE SURGERY     OMENTECTOMY  09/30/2017   Procedure: OMENTECTOMY;  Surgeon: [name], MD;  Location: [facility];  Service: Gynecology;;   ROBOTIC ASSISTED TOTAL HYSTERECTOMY WITH BILATERAL SALPINGO OOPHERECTOMY Bilateral 09/30/2017   Procedure: ROBOTIC ASSISTED TOTAL HYSTERECTOMY WITH BILATERAL SALPINGO OOPHORECTOMY;  Surgeon: [name], MD;  Location: [facility];  Service: Gynecology;  Laterality: Bilateral;     Social History:   reports that she has never smoked. She has never used smokeless tobacco. She reports that she does not drink alcohol and does not use drugs.   Family History:  Her family history includes Asthma in her father; Colon cancer in her father; Stroke in her mother.   Allergies Allergies  Allergen Reactions    Ozempic (0.25 Or 0.5 Mg-Dose) [Semaglutide(0.25 Or 0.'5mg'$ -Dos)] Nausea And Vomiting   Amoxil [Amoxicillin] Other (See Comments)    Cellulitis    Glucophage [Metformin] Diarrhea   Invokana [Canagliflozin] Other (See Comments)    UTI   Lipitor [Atorvastatin] Other (See Comments)    Myalgias   Lyrica Cr [Pregabalin Er] Other (See Comments)  Dizziness    Metformin And Related    Other Nausea And Vomiting    Pt states does not know drug but anesthesia drugs have made her have nausea and vomiting.    Sulfa Antibiotics Diarrhea   Trulicity [Dulaglutide] Other (See Comments)    GI Upset   Potassium-Containing Compounds Rash     Home Medications  Prior to Admission medications   Medication Sig Start Date End Date Taking? Authorizing Provider  acetaminophen (TYLENOL) 500 MG tablet Take 1 tablet (500 mg total) by mouth every 4 (four) hours as needed for moderate pain, headache or fever. 11/25/20   Pokhrel, Corrie Mckusick, MD  amLODipine (NORVASC) 5 MG tablet Take 5 mg by mouth daily.    [provider]  ascorbic acid (VITAMIN C) 500 MG tablet Take 500 mg by mouth daily.    [provider]  aspirin EC 81 MG tablet Take 81 mg by mouth daily.    [provider]  ceFAZolin (ANCEF) IVPB Inject 2 g into the vein every 12 (twelve) hours. End date 10/19/21 Patient not taking: Reported on 11/26/2021    [provider]  cetirizine (ZYRTEC) 10 MG tablet Take 10 mg by mouth daily.     [provider]  Cholecalciferol 1.25 MG (50000 UT) capsule Take 50,000 Units by mouth daily.    [provider]  diclofenac Sodium (VOLTAREN) 1 % GEL Apply 2 g topically 2 (two) times daily as needed (pain).    [provider]  famotidine (PEPCID) 20 MG tablet Take 20 mg by mouth 2 (two) times daily. 08/23/21   [provider]  fenofibrate 160 MG tablet Take 160 mg by mouth every morning.     [provider]  ferrous sulfate 325 (65 FE) MG tablet Take 325  mg by mouth daily with breakfast.    [provider]  furosemide (LASIX) 20 MG tablet Take 20 mg by mouth 2 (two) times daily. 09/28/21   [provider]  Insulin Aspart (NOVOLOG FLEXPEN [place]) Inject 10-20 Units into the skin See admin instructions. 10 units at breakfast, 10 units at lunch, 20 units at dinner    [provider]  insulin degludec (TRESIBA) 200 UNIT/ML FlexTouch Pen Inject 40 Units into the skin daily with breakfast.    [provider]  ipratropium (ATROVENT) 0.06 % nasal spray Place 2 sprays into both nostrils 3 (three) times daily as needed for allergies. 08/10/21   [provider]  lipase/protease/amylase (CREON) 36000 UNITS CPEP capsule Take 36,000-72,000 Units by mouth See admin instructions. 2 capsules (72000 units) with each meal, 1 capsules (36000 units) with each snack    [provider]  metoprolol succinate (TOPROL-XL) 25 MG 24 hr tablet Take 25 mg by mouth daily. 09/10/21   [provider]  omeprazole (PRILOSEC) 20 MG capsule Take 20 mg by mouth daily. 08/19/18   [provider]  simvastatin (ZOCOR) 20 MG tablet Take 20 mg by mouth daily.    [provider]  tiZANidine (ZANAFLEX) 2 MG tablet Take 0.5-1 tablets (1-2 mg total) by mouth every 8 (eight) hours as needed for muscle spasms. 10/09/21   Darliss Cheney, MD  traMADol (ULTRAM) 50 MG tablet Take 1 tablet (50 mg total) by mouth every 12 (twelve) hours as needed for severe pain or moderate pain. 10/09/21 10/09/22  Darliss Cheney, MD  vitamin B-12 (CYANOCOBALAMIN) 250 MCG tablet Take 250 mcg by mouth daily.    [provider]  Critical care time: 40 minutes     The patient is critically ill with multiple organ systems failure and requires high complexity decision making for assessment and support, frequent evaluation and titration of therapies, application of advanced monitoring technologies and extensive interpretation of multiple  databases.    Critical Care Time devoted to patient care services described in this note is 40 minutes. This time reflects time of care of this [place] NP. This critical care time does not reflect procedure time but could involve care discussion time with the PCCM attending.  Redmond School., MSN, APRN, AGACNP-BC [facility]  12/14/2021 , 9:57 PM  Please see [email or web address] for pager details  If no response, please call [phone] After hours, please call Elink at [phone]

## 2021-12-14 NOTE — Consult Note (Addendum)
[facility] Telemedicine Consult   Requesting Provider: [place] Participants: Patient, ED physician, bedside nurses, telestroke nurse   This consult was provided via telemedicine with 2-way video and audio communication. The patient/family was informed that care would be provided in this way and agreed to receive care in this manner.    Chief Complaint: Altered mental status  HPI: 76 year old female with a history of recent hospitalization secondary to sepsis who presents with altered mental status.  She was in her normal state of health this morning, has not been complaining of headaches or any other significant complaints recently and then was seen to be drooling around 1030.  She was still able to talk, however, and was interactive but then around noon when they went into to get her lunch she was found to have decreased responsiveness.  On EMS arrival, she apparently was looking around, but then was witnessed by EMS to have a convulsion en route.     She was last seen in her normal state of health during breakfast at 9 AM.  She never got out of bed, went back to bed because she was "not feeling well."    LKW: 9 AM tpa given?: No, outside of window IR Thrombectomy? No, no LVO MRS: 4   Exam: Vitals:   12/14/21 1445 12/14/21 1500  BP: (!) 122/47 (!) 151/72  Pulse: 89 (!) 101  Resp: (!) 21 16  SpO2: 100% 100%    General: 0  1A: Level of Consciousness - 1 1B: Ask Month and Age - 2 1C: 'Blink Eyes' & 'Squeeze Hands' - 2 2: Test Horizontal Extraocular Movements - 0 3: Test Visual Fields -did not fully assess prior to intubation- 0 4: Test Facial Palsy - 0 5A: Test Left Arm Motor Drift - 2 5B: Test Right Arm Motor Drift - 2 6A: Test Left Leg Motor Drift - 2 6B: Test Right Leg Motor Drift - 2 7: Test Limb Ataxia - 0 8: Test Sensation - 0 9: Test Language/Aphasia- 3 10: Test Dysarthria - 2 11: Test Extinction/Inattention - 0 NIHSS score: 18  She  moves all extremities relatively symmetrically, she localizes bilaterally, but does not cooperate with keeping her arms aloft.  She appears encephalopathic, and slightly agitated, removing her apparatus and straining her shirt.  She does not follow commands despite repeated attempts.  She had no meningismus.  Imaging Reviewed: CT head-negative, CTA/P-negative  Labs reviewed in epic and pertinent values follow: No leukocytosis   Assessment: 76 year old female with new onset seizure.  She currently appears postictal, and from the description of her being discovered earlier, I suspect that she had a seizure prior to being discovered.  With no stroke on CT and no lesion on CTA, no acute stroke intervention is indicated, and I have relatively low suspicion for stroke in any case.  She will need further work-up with MRI and EEG.  If she spikes a fever, will need LP, but low suspicion for infection at this time given no meningismus or complaints of headache earlier.  Though she had not returned to baseline, she was localizing bilaterally making ongoing seizure much less likely.  Given that she is now intubated, I would favor transfer for continuous EEG monitoring.  She has been hypertensive, and I think PRES would be a possibility as well.  Recommendations: 1) MRI brain with and without contrast 2) LTM EEG 3) follow-up CMP, magnesium 4) Keppra 2 g x 1 followed by 500 mg twice daily  Addison Lank  Leonel Ramsay, MD [facility] [phone]  If 7pm- 7am, please page neurology on call as listed in [place].

## 2021-12-14 NOTE — Progress Notes (Addendum)
eLink Physician-Brief Progress Note Patient Name: Elizabeth Crosby DOB: 06/30/1945 MRN: [id number]   Date of Service  12/14/2021  HPI/Events of Note  Hypoglycemia - Blood glucose = 27.  eICU Interventions  Plan: D50 1 amp IV now.  D10W IV infusion to run at 50 mL/hour.      Intervention Category Major Interventions: Other:;Respiratory failure - evaluation and management  Randee Huston Cornelia Copa 12/14/2021, 11:29 PM

## 2021-12-14 NOTE — Progress Notes (Signed)
Code Stroke cart activation at 1352/ EMS pre alert. Neurology paged at 1355. Neurologist, Dr Roland Rack, joins the telemedicine cart at 1400. EDP, Dr Sherwood Gambler, evaluating on arrival also at 1400.

## 2021-12-14 NOTE — ED Triage Notes (Signed)
Pt brought in by [facility] from [facility] with c/o AMS. LKW 1030 this morning. Facility reports pt is normally A&O x 3 but she now has AMS, is lethargic and has blood coming out of her mouth. EMS arrived and reports face was symmetrical and grip strength equal initially . During transport by EMS, pt started seizing, (mainly on the left side per EMS) for approximately 2 minutes. EMS reports left sided gazing and drooling out of the left side of the mouth. Per EMS-BP 135/64, HR 100, 97% on NRB.

## 2021-12-14 NOTE — Progress Notes (Signed)
1355 call time 1431 exam started 1437 exam finished 1440 images sent to [place] exam completed in epic 1443 [facility] radiology called

## 2021-12-14 NOTE — Progress Notes (Signed)
Neurologist, back on telemedicine cart to complete exam.

## 2021-12-14 NOTE — ED Notes (Signed)
Regenia Skeeter, MD gave verbal order for 50 mcg fentanyl.

## 2021-12-15 ENCOUNTER — Inpatient Hospital Stay: Payer: Self-pay

## 2021-12-15 ENCOUNTER — Other Ambulatory Visit (HOSPITAL_COMMUNITY): Payer: Medicare Other

## 2021-12-15 DIAGNOSIS — R569 Unspecified convulsions: Secondary | ICD-10-CM | POA: Diagnosis not present

## 2021-12-15 LAB — POCT I-STAT 7, (LYTES, BLD GAS, ICA,H+H)
Acid-base deficit: 3 mmol/L — ABNORMAL HIGH (ref 0.0–2.0)
Acid-base deficit: 4 mmol/L — ABNORMAL HIGH (ref 0.0–2.0)
Bicarbonate: 19.2 mmol/L — ABNORMAL LOW (ref 20.0–28.0)
Bicarbonate: 19.6 mmol/L — ABNORMAL LOW (ref 20.0–28.0)
Calcium, Ion: 1.2 mmol/L (ref 1.15–1.40)
Calcium, Ion: 1.22 mmol/L (ref 1.15–1.40)
HCT: 24 % — ABNORMAL LOW (ref 36.0–46.0)
HCT: 28 % — ABNORMAL LOW (ref 36.0–46.0)
Hemoglobin: 8.2 g/dL — ABNORMAL LOW (ref 12.0–15.0)
Hemoglobin: 9.5 g/dL — ABNORMAL LOW (ref 12.0–15.0)
O2 Saturation: 100 %
O2 Saturation: 100 %
Patient temperature: 34
Patient temperature: 98
Potassium: 3.5 mmol/L (ref 3.5–5.1)
Potassium: 3.6 mmol/L (ref 3.5–5.1)
Sodium: 140 mmol/L (ref 135–145)
Sodium: 142 mmol/L (ref 135–145)
TCO2: 20 mmol/L — ABNORMAL LOW (ref 22–32)
TCO2: 20 mmol/L — ABNORMAL LOW (ref 22–32)
pCO2 arterial: 25 mmHg — ABNORMAL LOW (ref 32–48)
pCO2 arterial: 26.3 mmHg — ABNORMAL LOW (ref 32–48)
pH, Arterial: 7.478 — ABNORMAL HIGH (ref 7.35–7.45)
pH, Arterial: 7.482 — ABNORMAL HIGH (ref 7.35–7.45)
pO2, Arterial: 163 mmHg — ABNORMAL HIGH (ref 83–108)
pO2, Arterial: 218 mmHg — ABNORMAL HIGH (ref 83–108)

## 2021-12-15 LAB — CBC WITH DIFFERENTIAL/PLATELET
Abs Immature Granulocytes: 0.08 10*3/uL — ABNORMAL HIGH (ref 0.00–0.07)
Basophils Absolute: 0.1 10*3/uL (ref 0.0–0.1)
Basophils Relative: 1 %
Eosinophils Absolute: 0.4 10*3/uL (ref 0.0–0.5)
Eosinophils Relative: 2 %
HCT: 27.6 % — ABNORMAL LOW (ref 36.0–46.0)
Hemoglobin: 9.9 g/dL — ABNORMAL LOW (ref 12.0–15.0)
Immature Granulocytes: 1 %
Lymphocytes Relative: 24 %
Lymphs Abs: 3.7 10*3/uL (ref 0.7–4.0)
MCH: 32.5 pg (ref 26.0–34.0)
MCHC: 35.9 g/dL (ref 30.0–36.0)
MCV: 90.5 fL (ref 80.0–100.0)
Monocytes Absolute: 1.3 10*3/uL — ABNORMAL HIGH (ref 0.1–1.0)
Monocytes Relative: 9 %
Neutro Abs: 9.7 10*3/uL — ABNORMAL HIGH (ref 1.7–7.7)
Neutrophils Relative %: 63 %
Platelets: 223 10*3/uL (ref 150–400)
RBC: 3.05 MIL/uL — ABNORMAL LOW (ref 3.87–5.11)
RDW: 13.4 % (ref 11.5–15.5)
WBC: 15.3 10*3/uL — ABNORMAL HIGH (ref 4.0–10.5)
nRBC: 0 % (ref 0.0–0.2)

## 2021-12-15 LAB — TSH: TSH: 3.455 u[IU]/mL (ref 0.350–4.500)

## 2021-12-15 LAB — BASIC METABOLIC PANEL
Anion gap: 10 (ref 5–15)
BUN: 71 mg/dL — ABNORMAL HIGH (ref 8–23)
CO2: 20 mmol/L — ABNORMAL LOW (ref 22–32)
Calcium: 8.6 mg/dL — ABNORMAL LOW (ref 8.9–10.3)
Chloride: 111 mmol/L (ref 98–111)
Creatinine, Ser: 1.8 mg/dL — ABNORMAL HIGH (ref 0.44–1.00)
GFR, Estimated: 29 mL/min — ABNORMAL LOW (ref >60)
Glucose, Bld: 114 mg/dL — ABNORMAL HIGH (ref 70–99)
Potassium: 3.6 mmol/L (ref 3.5–5.1)
Sodium: 141 mmol/L (ref 135–145)

## 2021-12-15 LAB — GLUCOSE, CAPILLARY
Glucose-Capillary: 112 mg/dL — ABNORMAL HIGH (ref 70–99)
Glucose-Capillary: 129 mg/dL — ABNORMAL HIGH (ref 70–99)
Glucose-Capillary: 137 mg/dL — ABNORMAL HIGH (ref 70–99)
Glucose-Capillary: 140 mg/dL — ABNORMAL HIGH (ref 70–99)
Glucose-Capillary: 142 mg/dL — ABNORMAL HIGH (ref 70–99)
Glucose-Capillary: 143 mg/dL — ABNORMAL HIGH (ref 70–99)
Glucose-Capillary: 146 mg/dL — ABNORMAL HIGH (ref 70–99)
Glucose-Capillary: 146 mg/dL — ABNORMAL HIGH (ref 70–99)
Glucose-Capillary: 147 mg/dL — ABNORMAL HIGH (ref 70–99)
Glucose-Capillary: 155 mg/dL — ABNORMAL HIGH (ref 70–99)

## 2021-12-15 LAB — MAGNESIUM
Magnesium: 2 mg/dL (ref 1.7–2.4)
Magnesium: 1.9 mg/dL (ref 1.7–2.4)

## 2021-12-15 LAB — MRSA NEXT GEN BY PCR, NASAL: MRSA by PCR Next Gen: NOT DETECTED

## 2021-12-15 LAB — T4, FREE: Free T4: 1.15 ng/dL — ABNORMAL HIGH (ref 0.61–1.12)

## 2021-12-15 LAB — CORTISOL: Cortisol, Plasma: 16.8 ug/dL

## 2021-12-15 LAB — PHOSPHORUS: Phosphorus: 3.4 mg/dL (ref 2.5–4.6)

## 2021-12-15 LAB — TROPONIN I (HIGH SENSITIVITY): Troponin I (High Sensitivity): 38 ng/L — ABNORMAL HIGH (ref <18)

## 2021-12-15 LAB — CK: Total CK: 76 U/L (ref 38–234)

## 2021-12-15 MED ORDER — GLUCAGON HCL RDNA (DIAGNOSTIC) 1 MG IJ SOLR
5.0000 mg | Freq: Once | INTRAVENOUS | Status: AC
  Administered 2021-12-15: 5 mg via INTRAVENOUS
  Filled 2021-12-15: qty 5

## 2021-12-15 MED ORDER — VITAL HIGH PROTEIN PO LIQD
1000.0000 mL | ORAL | Status: DC
  Administered 2021-12-15: 1000 mL

## 2021-12-15 MED ORDER — VITAL AF 1.2 CAL PO LIQD
1000.0000 mL | ORAL | Status: DC
  Administered 2021-12-15 – 2021-12-16 (×2): 1000 mL

## 2021-12-15 MED ORDER — SODIUM CHLORIDE 0.9% FLUSH
10.0000 mL | Freq: Two times a day (BID) | INTRAVENOUS | Status: DC
  Administered 2021-12-15 (×2): 10 mL
  Administered 2021-12-16: 30 mL
  Administered 2021-12-16 – 2021-12-17 (×2): 10 mL
  Administered 2021-12-17: 20 mL
  Administered 2021-12-18 – 2021-12-28 (×18): 10 mL

## 2021-12-15 MED ORDER — ATROPINE SULFATE 1 MG/10ML IJ SOSY
0.5000 mg | PREFILLED_SYRINGE | INTRAMUSCULAR | Status: DC | PRN
  Administered 2021-12-15: 0.5 mg via INTRAVENOUS

## 2021-12-15 MED ORDER — DOPAMINE-DEXTROSE 3.2-5 MG/ML-% IV SOLN
0.0000 ug/kg/min | INTRAVENOUS | Status: DC
  Administered 2021-12-15: 5 ug/kg/min via INTRAVENOUS
  Filled 2021-12-15: qty 250

## 2021-12-15 MED ORDER — SODIUM CHLORIDE 0.9% FLUSH: 10.0000 mL | INTRAVENOUS | Status: DC | PRN

## 2021-12-15 MED ORDER — ATROPINE SULFATE 1 MG/10ML IJ SOSY
1.0000 mg | PREFILLED_SYRINGE | INTRAMUSCULAR | Status: DC | PRN
  Administered 2021-12-15: 0.5 mg via INTRAVENOUS

## 2021-12-15 MED ORDER — HEPARIN SODIUM (PORCINE) 5000 UNIT/ML IJ SOLN
5000.0000 [IU] | Freq: Three times a day (TID) | INTRAMUSCULAR | Status: DC
  Administered 2021-12-15 – 2021-12-28 (×41): 5000 [IU] via SUBCUTANEOUS
  Filled 2021-12-15 (×41): qty 1

## 2021-12-15 MED ORDER — MIDAZOLAM HCL 2 MG/2ML IJ SOLN
2.0000 mg | Freq: Once | INTRAMUSCULAR | Status: AC
  Administered 2021-12-16: 2 mg via INTRAVENOUS
  Filled 2021-12-15: qty 2

## 2021-12-15 MED ORDER — PROSOURCE TF20 ENFIT COMPATIBL EN LIQD
60.0000 mL | Freq: Every day | ENTERAL | Status: DC
  Administered 2021-12-15: 60 mL
  Filled 2021-12-15: qty 60

## 2021-12-15 NOTE — Procedures (Addendum)
Patient Name: Elizabeth Crosby  MRN: [id number]  Epilepsy Attending: Lora Havens  Referring Physician/Provider: Donnetta Simpers, MD  Duration: 12/14/2021 2325 to 12/15/2021 2335  Patient history: 76 y.o. female were with seizures and AMS. EEG to evaluate for seizure  Level of alertness: Awake, asleep  AEDs during EEG study: LEV  Technical aspects: This EEG study was done with scalp electrodes positioned according to the 10-20 International system of electrode placement. Electrical activity was reviewed with band pass filter of 1-'70Hz'$ , sensitivity of 7 uV/mm, display speed of 57m/sec with a '60Hz'$  notched filter applied as appropriate. EEG data were recorded continuously and digitally stored.  Video monitoring was available and reviewed as appropriate.  Description: No posterior dominant rhythm was seen. Sleep was characterized by vertex waves, sleep spindles (12 to 14 Hz), maximal frontocentral region. EEG showed continuous generalized 3 to 6 Hz theta-delta slowing. Hyperventilation and photic stimulation were not performed.     ABNORMALITY - Continuous slow, generalized  IMPRESSION: This study is suggestive of moderate diffuse encephalopathy, nonspecific etiology.  No seizures or epileptiform discharges were seen throughout the recording.  Thu Baggett OBarbra Sarks

## 2021-12-15 NOTE — Progress Notes (Signed)
Neurology progress note  S: Hemodynamic instability has improved. Still on norepi.   LTM EEG personally reviewed through 1500 today, no electrographic seizure activity.   CTA H&N, CTP 9/15 1. CT perfusion negative for acute infarct or ischemia. 2. Repeat head CT negative.  Aspects 10 3. No significant carotid or vertebral artery stenosis in the neck. Atherosclerotic calcification left carotid bulb without significant stenosis 4. Mild fusiform dilatation of the internal carotid artery below the skull base bilaterally. Negative for dissection.  O:  Vitals:   12/15/21 2000 12/15/21 2014  BP: 129/78 129/78  Pulse: 66 69  Resp: 12 19  Temp: 98.4 F (36.9 C) 98.4 F (36.9 C)  SpO2: 100% 100%   Sedation not paused for exam 2/2 hemodynamic instability earlier today (on fentanyl)  Gen: intubated, sedated CV: RRR Resp: ventilated  MS: intubated, sedated, no response to commands or noxious stimuli Speech: intubated CN: pupils pinpoint, minimally reactive, (-) corneals, oculocephalics, cough, gag on sedation Motor & sensory: no response to noxious stimuli  A/P: 76 yo woman with new onset seizure intubated for airway protection and transferred from [facility] to [facility] for cEEG. Her EEG today showed no seizure activity but she did have significant hemodynamic instability (now improved but still on norepi). She is scheduled for MRI brain this evening (ddx includes PRES, stroke, brain mass). She remains afebrile w/o signs c/f CNS infection.  - Continue LTM EEG - Continue keppra '500mg'$  bid - MRI brain wwo contrast - Will continue to follow  This patient is critically ill and at significant risk of neurological worsening, death and care requires constant monitoring of vital signs, hemodynamics,respiratory and cardiac monitoring, neurological assessment, discussion with family, other specialists and medical decision making of high complexity. I spent 45 minutes of neurocritical care time  in the care  of  this patient. This was time spent independent of any time provided by nurse practitioner or PA.  Su Monks, MD [facility] [phone]  If 7pm- 7am, please page neurology on call as listed in [place].

## 2021-12-15 NOTE — Progress Notes (Signed)
Initial Nutrition Assessment  DOCUMENTATION CODES:   Obesity unspecified  INTERVENTION:  Initiate trickle tube feeding via OG (tip in distal stomach): Vital 1.2 at 20 ml/h  Once able to advance TF, recommend: Advancing Vital 1.2 43m q6h to a goal rate of 515mhr (120049mer day) Provides 1440 kcal, 90g gm protein, 1003 ml free water daily  NUTRITION DIAGNOSIS:   Inadequate oral intake related to acute illness as evidenced by NPO status.  GOAL:   Patient will meet greater than or equal to 90% of their needs  MONITOR:   Labs, TF tolerance, Weight trends  REASON FOR ASSESSMENT:   Consult Enteral/tube feeding initiation and management  ASSESSMENT:   Pt admitted from nursing home with AMS. Experienced seizure-like activity and was subsequently intubated for airway protection. PMH significant for HTN, CKD stage IV, glaucoma, gout, anxiety, T2DM, anemia, HLD. Recently admitted 7/5-7/11 or staph lugdunesis bacteremia  CT negative for stroke. Pending brain MRI, unable to perform d/t titration of pressors.   Consult received for trickle TF. VHP has previously been ordered to [facility]. Spoke with MD, continue with trickles at this time. Adjusted TF order and reached out to RN with new order.  Patient is currently intubated on ventilator support MV: 7.5 L/min Temp (24hrs), Avg:97.5 F (36.4 C), Min:92.7 F (33.7 C), Max:99.3 F (37.4 C) MAP 67  Question accuracy of pt's weight history. Her weight documented to have increased from 93.5 kg (01/29/21) to 105.1 kg (10/09/21). Current weight noted to be 85.7 kg. If this is true weight loss, this would be an 18.5% weight loss which is significant for time frame.   Medications: colace, SSI 0-15 units q4h, protonix, miralax IV drips: dopamine, fentanyl, levo @ 45m11m  Labs: BUN 71, Cr 1.80, GFR 29, CBG's 129-146 x24 hours  NUTRITION - FOCUSED PHYSICAL EXAM: RD working remotely. Deferred to follow up.   Diet Order:   Diet Order              Diet NPO time specified  Diet effective now                   EDUCATION NEEDS:   No education needs have been identified at this time  Skin:  Skin Assessment: Reviewed RN Assessment  Last BM:  PTA  Height:   Ht Readings from Last 1 Encounters:  12/14/21 '5\' 1"'$  (1.549 m)    Weight:   Wt Readings from Last 1 Encounters:  12/14/21 85.7 kg    Ideal Body Weight:  47.7 kg  BMI:  Body mass index is 35.7 kg/m.  Estimated Nutritional Needs:   Kcal:  1400-1600  Protein:  95g  Fluid:  >/=1.5L  AlliClayborne DanaN, LDN Clinical Nutrition

## 2021-12-15 NOTE — Progress Notes (Addendum)
eLink Physician-Brief Progress Note Patient Name: Elizabeth Crosby DOB: 1945-06-05 MRN: [id number]   Date of Service  12/15/2021  HPI/Events of Note  Multiple issues: 1. ABG on 30%/PRVC 14/TV 400/P 5 = 7.482/25.0/163/19.2. Low pH is not the etiology of patient's bradycardia. 2. K+ = 3.6. Hyperkalemia is not the etiology of the patient's bradycardia. The patient has an allergy listed to potassium containing compounds. Will hold off on K+ replacement at this time.   eICU Interventions  Plan: Continue present ventilator management.      Intervention Category Major Interventions: Respiratory failure - evaluation and management  Elizabeth Crosby 12/15/2021, 3:45 AM

## 2021-12-15 NOTE — Progress Notes (Addendum)
[facility] Progress Note Patient Name: Elizabeth Crosby DOB: 07/09/1945 MRN: [id number]   Date of Service  12/15/2021  HPI/Events of Note  Bradycardia - Patient dropped her HR into 30's. Given Atropine 0.5 mg IV x 2 with transient response. Patient was on Metoprolol and Norvasc at home. Will challenge with Glucagon IV loading dose. ECG earlier revealed  marked sinus bradycardia with HR in 40's, right bundle branch block, left anterior fascicular block (Bifascicular block) and lateral infarct (cited on or before 14-Dec-2021). BP satisfactory = 144/55 with MAP = 78.  eICU Interventions  Plan: Atropine 1 mg IV PRN HR < 50.  Glucagon 5 mg IV over 5 minutes.  Cycle Troponin. Cardiac Echo in AM. Send AM labs now.      Intervention Category Major Interventions: Arrhythmia - evaluation and management  Xadrian Craighead Eugene 12/15/2021, 2:41 AM

## 2021-12-15 NOTE — Progress Notes (Signed)
Peripherally Inserted Central Catheter Placement  The IV Nurse has discussed with the patient and/or persons authorized to consent for the patient, the purpose of this procedure and the potential benefits and risks involved with this procedure.  The benefits include less needle sticks, lab draws from the catheter, and the patient may be discharged home with the catheter. Risks include, but not limited to, infection, bleeding, blood clot (thrombus formation), and puncture of an artery; nerve damage and irregular heartbeat and possibility to perform a PICC exchange if needed/ordered by physician.  Alternatives to this procedure were also discussed.  Bard Power PICC patient education guide, fact sheet on infection prevention and patient information card has been provided to patient /or left at bedside.  Daughter at bedside gave consent due to sedation.  PICC Placement Documentation  PICC Triple Lumen 80/88/11 Right Basilic 35 cm 0 cm (Active)  Indication for Insertion or Continuance of Line Vasoactive infusions 12/15/21 1111  Exposed Catheter (cm) 0 cm 12/15/21 1111  Site Assessment Clean;Dry;Bruised 12/15/21 1111  Lumen #1 Status Flushed;Saline locked;Blood return noted 12/15/21 1111  Lumen #2 Status Flushed;Saline locked;Blood return noted 12/15/21 1111  Lumen #3 Status Flushed;Saline locked;Blood return noted 12/15/21 1111  Dressing Type Transparent;Securing device 12/15/21 1111  Dressing Status Antimicrobial disc in place;Clean, Dry, Intact 12/15/21 1111  Safety Lock Not Applicable 06/13/92 5859  Line Care Connections checked and tightened 12/15/21 1111  Line Adjustment (NICU/IV Team Only) No 12/15/21 1111  Dressing Intervention New dressing 12/15/21 1111  Dressing Change Due 12/22/21 12/15/21 1111       Rolena Infante 12/15/2021, 11:26 AM

## 2021-12-15 NOTE — Progress Notes (Signed)
LTM EEG hooked up and running - no initial skin breakdown - push button tested - neuro notified. [facility] monitoring.  

## 2021-12-15 NOTE — Progress Notes (Signed)
[facility] Progress Note Patient Name: Elizabeth Crosby DOB: 03-13-1946 MRN: [id number]   Date of Service  12/15/2021  HPI/Events of Note  ABG on 40%/PRVC 18/TV 400/P 5 = 7.478/26.3/218/19.6.  eICU Interventions  Plan: Decrease PRVC rate to 14. Repeat ABG at 5 AM.     Intervention Category Major Interventions: Acid-Base disturbance - evaluation and management;Respiratory failure - evaluation and management  Lysle Dingwall 12/15/2021, 12:38 AM

## 2021-12-15 NOTE — Progress Notes (Signed)
Dr. Valeta Harms notified of urine output of approximately 62m/hr since 1000. Order given to begin tube feeding infusion.

## 2021-12-15 NOTE — Progress Notes (Signed)
ELink/CCM notified, patient is currently at maximum of 48mg of Levophed per peripheral IV.

## 2021-12-15 NOTE — Progress Notes (Signed)
NAME:  Elizabeth Crosby, MRN:  [id number], DOB:  [birth year], LOS: 1 ADMISSION DATE:  12/14/2021, CONSULTATION DATE: 9/15 REFERRING MD: Regenia Skeeter, CHIEF COMPLAINT: Seizure  History of Present Illness:  76 year old female patient with history as outlined below presented to the emergency room at [facility] On 9/15 when she was brought in from nursing home with altered mental status.  First noted earlier that a.m.  On EMS arrival she was found to be confused, drooling out of the left side of her mouth but following commands.  In route to the emergency room she had a witnessed episode lasting about 2 minutes of seizure-like activity.  She was intubated for airway protection.  A code stroke was called.  She was seen by telemetry neurology during that time was slightly agitated, CTA head was negative, she was negative for stroke.  She was felt at that time to be postictal from seizure.  As she was hypertensive neurology consultation included PRES on differential diagnosis.  She was loaded with Keppra, and transferred to [facility] for LTM EEG monitoring as well as plans for MRI of brain.  Critical care asked to admit.  Pertinent  Medical History  Hypertension, CKD stage IV glaucoma, gout, anxiety, insulin-dependent diabetes anemia, hyperlipidemia.  Poor dentition, falls at home  recent hospitalization from 7/5 to 7/11 for staph lugdunesis bacteremia, TEE was negative for vegetation.  Source not clear.  Course was complicated by acute encephalopathy as well as respiratory failure requiring ventilation.  Infectious disease follow-up felt source could potentially have been from a fall.  She completed 6 weeks of cefazolin. Significant Hospital Events: Including procedures, antibiotic start and stop dates in addition to other pertinent events   9/15 admitted with working diagnosis of seizure.  Seized in route to the emergency room and was unable to protect airway on arrival.  Intubated.  Code stroke  called.  CTA negative for stroke.  There is mild fusiform dilation of the internal carotid artery below the skull base bilaterally but this was negative for dissection.  Loaded with IV Keppra, transferred to [facility] for MRI of brain and LTM-EEG monitoring  Interim History / Subjective:   Patient critically ill intubated on mechanical life support   Objective   Blood pressure (!) 105/43, pulse (!) 54, temperature 98.1 F (36.7 C), resp. rate 12, height '5\' 1"'$  (1.549 m), weight 85.7 kg, SpO2 100 %.    Vent Mode: PRVC FiO2 (%):  [30 %-100 %] 30 % Set Rate:  [14 bmp-18 bmp] 14 bmp Vt Set:  [400 mL-500 mL] 400 mL PEEP:  [5 cmH20] 5 cmH20 Plateau Pressure:  [9 cmH20-17 cmH20] 9 cmH20   Intake/Output Summary (Last 24 hours) at 12/15/2021 0037 Last data filed at 12/15/2021 0620 Gross per 24 hour  Intake 1299.35 ml  Output 775 ml  Net 524.35 ml   Filed Weights   12/14/21 1804  Weight: 85.7 kg    General: In bed, intubated obese chronically ill-appearing female intubated on mechanical life support HEENT: NCAT, does not track, sedated Neuro: RASS -3, pupils small, does not respond to pain sedated CV: Regular rhythm, S1-S2 PULM: Bilateral mechanically ventilated breath sounds GI: Soft, nontender nondistended Extremities: No significant edema Skin: No rash  Labs Labs reviewed Serum creatinine improved  Imaging CT head negative for acute intracranial abnormality, motion graded per radiology Chest x-ray: No pneumothorax, no pleural effusion, no obvious infiltrate, stable ET tube KUB: NG tube in stomach CT angio head and neck: No  significant carotid or vertebral artery stenosis in neck per radiology, mild fusiform dilation of internal carotid artery below the skull base bilaterally Twelve-lead: SR, RBBB-present on previous ecg  Chest x-ray yesterday 12/14/2021: Support devices in place Lung fields clear, no infiltrate The patient's images have been independently reviewed by me.      Resolved Hospital Problem list     Assessment & Plan:   New onset seizure  Etiology not clear, CTA was negative did have some documented hypertension raising concern for PRES Loaded with Keppra in ER Plan Plan: Continue antiepileptics Continue seizure precautions Continue long-term video EEG Brain MRI is still pending, unable to go down for imaging at this time due to titration of pressor needs.  Acute metabolic encephalopathy.   Likely postictal however also could consider PRES Plan Supportive care Continue antiepileptics Continue BP goal less than 194 systolic  Acute respiratory failure secondary to ineffective airway management requiring intubation Respiratory alkalosis, suspect secondary to overventilation on vent ABG 7.53/26/456/22 Plan: Low tidal volume ventilation strategy Goal plateau pressures less than 30 Wean PEEP and FiO2 to maintain sats greater than 90% Ventilator associated pneumonia prophylaxis bundle SBT SAT as tolerated, current sedation needs preclude Continue low-dose fentanyl Holding propofol due to bradycardia.  Hypotension, remains on pressors History of hypertension.  Looks like peak recorded in the ER is 174 systolic Bradycardia Plan I am unsure what her hypotension and bradycardia is related to To me at this point it sounds like it was related to medications and oversedation. She is now on fentanyl her pressures are better. She does have a low diastolic but she was placed on dopamine. They are also checking blood pressures in her leg on a cuff. She is in the process of getting a PICC line we will switch her to IV norepinephrine increase that and come off of the dopamine.  CKD stage IV Plan Follow I's and O's, serial chemistries BMP  History of diabetes II on insulin Plan Follow blood glucose with SSI Goal blood glucose 140-180  GERD -PPI  History of anemia Plan -Monitor   Best Practice (right click and "Reselect all  SmartList Selections" daily)   Diet/type: NPO w/ meds via tube DVT prophylaxis: SCD GI prophylaxis: PPI Lines: N/A Foley:  Yes, and it is still needed Code Status:  full code Last date of multidisciplinary goals of care discussion [pending]  Labs    CBC: Recent Labs  Lab 12/14/21 1400 12/14/21 1509 12/15/21 0021 12/15/21 0326 12/15/21 0407  WBC 10.1  --   --   --  15.3*  NEUTROABS 6.6  --   --   --  9.7*  HGB 11.1* 9.9* 9.5* 8.2* 9.9*  HCT 31.3* 29.0* 28.0* 24.0* 27.6*  MCV 91.8  --   --   --  90.5  PLT 226  --   --   --  081    Basic Metabolic Panel: Recent Labs  Lab 12/14/21 1400 12/14/21 1509 12/14/21 1544 12/14/21 2256 12/15/21 0021 12/15/21 0126 12/15/21 0326  NA 139 141  --  141 140 141 142  K 4.5 4.4  --  3.5 3.6 3.6 3.5  CL 109 108  --  112*  --  111  --   CO2 20*  --   --  20*  --  20*  --   GLUCOSE 128* 122*  --  36*  --  114*  --   BUN 83* 90*  --  72*  --  71*  --  CREATININE 2.05* 2.30*  --  1.77*  --  1.80*  --   CALCIUM 9.2  --   --  8.4*  --  8.6*  --   MG  --   --  2.0 1.9  --  2.0  --    GFR: Estimated Creatinine Clearance: 26.9 mL/min (A) (by C-G formula based on SCr of 1.8 mg/dL (H)). Recent Labs  Lab 12/14/21 1400 12/14/21 2212 12/15/21 0407  WBC 10.1  --  15.3*  LATICACIDVEN  --  1.2  --     Liver Function Tests: Recent Labs  Lab 12/14/21 1400  AST 40  ALT 29  ALKPHOS 121  BILITOT 1.0  PROT 6.9  ALBUMIN 3.1*   No results for input(s): "LIPASE", "AMYLASE" in the last 168 hours. No results for input(s): "AMMONIA" in the last 168 hours.  ABG    Component Value Date/Time   PHART 7.482 (H) 12/15/2021 0326   PCO2ART 25.0 (L) 12/15/2021 0326   PO2ART 163 (H) 12/15/2021 0326   HCO3 19.2 (L) 12/15/2021 0326   TCO2 20 (L) 12/15/2021 0326   ACIDBASEDEF 4.0 (H) 12/15/2021 0326   O2SAT 100 12/15/2021 0326     Coagulation Profile: Recent Labs  Lab 12/14/21 1400  INR 1.1    Cardiac Enzymes: Recent Labs  Lab  12/15/21 0126  CKTOTAL 76    HbA1C: Hgb A1c MFr Bld  Date/Time Value Ref Range Status  10/03/2021 10:18 AM 5.7 (H) 4.8 - 5.6 % Final    Comment:    (NOTE) Pre diabetes:          5.7%-6.4%  Diabetes:              >6.4%  Glycemic control for   <7.0% adults with diabetes   11/19/2020 05:03 AM 7.2 (H) 4.8 - 5.6 % Final    Comment:    (NOTE) Pre diabetes:          5.7%-6.4%  Diabetes:              >6.4%  Glycemic control for   <7.0% adults with diabetes     CBG: Recent Labs  Lab 12/14/21 2350 12/15/21 0244 12/15/21 0351 12/15/21 0643 12/15/21 0743  GLUCAP 140* 112* 129* 155* 140*    Review of Systems:   Unable to complete ROS due to patient status  Past Medical History:  She,  has a past medical history of Anxiety, Arthritis, Cellulitis, Chronic kidney disease (CKD), stage III (moderate) (HCC), Complication of anesthesia, Diabetes mellitus without complication ([place]), GERD (gastroesophageal reflux disease), Glaucoma, Gout, History of blood transfusion, History of kidney stones, History of radiation therapy, Hyperlipidemia, Hypertension, Leukocytes in urine, Lumbar back pain, and Sleep apnea.   Surgical History:   Past Surgical History:  Procedure Laterality Date   APPENDECTOMY  1972   CATARACT EXTRACTION W/PHACO Right 01/10/2014   Procedure: CATARACT EXTRACTION PHACO AND INTRAOCULAR LENS PLACEMENT ([place]);  Surgeon: Tonny Branch, MD;  Location: [facility];  Service: Ophthalmology;  Laterality: Right;  CDE:5.78   CATARACT EXTRACTION W/PHACO Left 01/27/2014   Procedure: CATARACT EXTRACTION PHACO AND INTRAOCULAR LENS PLACEMENT LEFT EYE CDE=5.75;  Surgeon: Tonny Branch, MD;  Location: [facility];  Service: Ophthalmology;  Laterality: Left;   CHOLECYSTECTOMY  1972   COLONOSCOPY WITH PROPOFOL N/A 09/01/2012   Procedure: COLONOSCOPY WITH PROPOFOL;  Surgeon: Garlan Fair, MD;  Location: [facility];  Service: Endoscopy;  Laterality: N/A;   KIDNEY STONE SURGERY     OMENTECTOMY   09/30/2017  Procedure: OMENTECTOMY;  Surgeon: [name], MD;  Location: [facility];  Service: Gynecology;;   ROBOTIC ASSISTED TOTAL HYSTERECTOMY WITH BILATERAL SALPINGO OOPHERECTOMY Bilateral 09/30/2017   Procedure: ROBOTIC ASSISTED TOTAL HYSTERECTOMY WITH BILATERAL SALPINGO OOPHORECTOMY;  Surgeon: [name], MD;  Location: [facility];  Service: Gynecology;  Laterality: Bilateral;     Social History:   reports that she has never smoked. She has never used smokeless tobacco. She reports that she does not drink alcohol and does not use drugs.   Family History:  Her family history includes Asthma in her father; Colon cancer in her father; Stroke in her mother.   Allergies Allergies  Allergen Reactions   Ozempic (0.25 Or 0.5 Mg-Dose) [Semaglutide(0.25 Or 0.'5mg'$ -Dos)] Nausea And Vomiting   Amoxil [Amoxicillin] Other (See Comments)    Cellulitis    Glucophage [Metformin] Diarrhea   Invokana [Canagliflozin] Other (See Comments)    UTI   Lipitor [Atorvastatin] Other (See Comments)    Myalgias   Lyrica Cr [Pregabalin Er] Other (See Comments)    Dizziness    Metformin And Related    Other Nausea And Vomiting    Pt states does not know drug but anesthesia drugs have made her have nausea and vomiting.    Sulfa Antibiotics Diarrhea   Trulicity [Dulaglutide] Other (See Comments)    GI Upset   Potassium-Containing Compounds Rash     Home Medications  Prior to Admission medications   Medication Sig Start Date End Date Taking? Authorizing Provider  acetaminophen (TYLENOL) 500 MG tablet Take 1 tablet (500 mg total) by mouth every 4 (four) hours as needed for moderate pain, headache or fever. 11/25/20   Pokhrel, Corrie Mckusick, MD  amLODipine (NORVASC) 5 MG tablet Take 5 mg by mouth daily.    [provider]  ascorbic acid (VITAMIN C) 500 MG tablet Take 500 mg by mouth daily.    [provider]  aspirin EC 81 MG tablet Take 81 mg by mouth daily.    [provider]  ceFAZolin (ANCEF)  IVPB Inject 2 g into the vein every 12 (twelve) hours. End date 10/19/21 Patient not taking: Reported on 11/26/2021    [provider]  cetirizine (ZYRTEC) 10 MG tablet Take 10 mg by mouth daily.     [provider]  Cholecalciferol 1.25 MG (50000 UT) capsule Take 50,000 Units by mouth daily.    [provider]  diclofenac Sodium (VOLTAREN) 1 % GEL Apply 2 g topically 2 (two) times daily as needed (pain).    [provider]  famotidine (PEPCID) 20 MG tablet Take 20 mg by mouth 2 (two) times daily. 08/23/21   [provider]  fenofibrate 160 MG tablet Take 160 mg by mouth every morning.     [provider]  ferrous sulfate 325 (65 FE) MG tablet Take 325 mg by mouth daily with breakfast.    [provider]  furosemide (LASIX) 20 MG tablet Take 20 mg by mouth 2 (two) times daily. 09/28/21   [provider]  Insulin Aspart (NOVOLOG FLEXPEN ) Inject 10-20 Units into the skin See admin instructions. 10 units at breakfast, 10 units at lunch, 20 units at dinner    [provider]  insulin degludec (TRESIBA) 200 UNIT/ML FlexTouch Pen Inject 40 Units into the skin daily with breakfast.    [provider]  ipratropium (ATROVENT) 0.06 % nasal spray Place 2 sprays into both nostrils 3 (three) times daily as needed for allergies. 08/10/21   [provider]  lipase/protease/amylase (CREON) 36000 UNITS CPEP capsule Take 36,000-72,000 Units by mouth See admin instructions. 2 capsules (72000 units) with each meal, 1 capsules (36000 units) with each snack    [provider]  metoprolol succinate (TOPROL-XL) 25 MG 24 hr tablet Take 25 mg by mouth daily. 09/10/21   [provider]  omeprazole (PRILOSEC) 20 MG capsule Take 20 mg by mouth daily. 08/19/18   [provider]  simvastatin (ZOCOR) 20 MG tablet Take 20 mg by mouth daily.    [provider]  tiZANidine (ZANAFLEX) 2 MG tablet Take 0.5-1  tablets (1-2 mg total) by mouth every 8 (eight) hours as needed for muscle spasms. 10/09/21   Darliss Cheney, MD  traMADol (ULTRAM) 50 MG tablet Take 1 tablet (50 mg total) by mouth every 12 (twelve) hours as needed for severe pain or moderate pain. 10/09/21 10/09/22  Darliss Cheney, MD  vitamin B-12 (CYANOCOBALAMIN) 250 MCG tablet Take 250 mcg by mouth daily.    [provider]     This patient is critically ill with multiple organ system failure; which, requires frequent high complexity decision making, assessment, support, evaluation, and titration of therapies. This was completed through the application of advanced monitoring technologies and extensive interpretation of multiple databases. During this encounter critical care time was devoted to patient care services described in this note for 32 minutes.  Garner Nash, DO [facility] 12/15/2021 8:43 AM

## 2021-12-15 NOTE — Progress Notes (Signed)
[facility] Progress Note Patient Name: Elizabeth Crosby DOB: [birth year] MRN: [id number]   Date of Service  12/15/2021  HPI/Events of Note  Patient scheduled for MRI scan at 1 AM. Nursing request for extra sedation for MRI. Patient is intubated and ventilated.   eICU Interventions  Plan: Versed 2 mg IV X 1 at 12:45 AM.     Intervention Category Major Interventions: Other:  Lysle Dingwall 12/15/2021, 8:40 PM

## 2021-12-16 ENCOUNTER — Inpatient Hospital Stay (HOSPITAL_COMMUNITY): Payer: Medicare Other

## 2021-12-16 DIAGNOSIS — R569 Unspecified convulsions: Secondary | ICD-10-CM | POA: Diagnosis not present

## 2021-12-16 DIAGNOSIS — R001 Bradycardia, unspecified: Secondary | ICD-10-CM

## 2021-12-16 LAB — BASIC METABOLIC PANEL
Anion gap: 12 (ref 5–15)
BUN: 69 mg/dL — ABNORMAL HIGH (ref 8–23)
CO2: 18 mmol/L — ABNORMAL LOW (ref 22–32)
Calcium: 8.4 mg/dL — ABNORMAL LOW (ref 8.9–10.3)
Chloride: 106 mmol/L (ref 98–111)
Creatinine, Ser: 1.87 mg/dL — ABNORMAL HIGH (ref 0.44–1.00)
GFR, Estimated: 28 mL/min — ABNORMAL LOW (ref >60)
Glucose, Bld: 196 mg/dL — ABNORMAL HIGH (ref 70–99)
Potassium: 3.8 mmol/L (ref 3.5–5.1)
Sodium: 136 mmol/L (ref 135–145)

## 2021-12-16 LAB — GLUCOSE, CAPILLARY
Glucose-Capillary: 162 mg/dL — ABNORMAL HIGH (ref 70–99)
Glucose-Capillary: 192 mg/dL — ABNORMAL HIGH (ref 70–99)
Glucose-Capillary: 158 mg/dL — ABNORMAL HIGH (ref 70–99)
Glucose-Capillary: 136 mg/dL — ABNORMAL HIGH (ref 70–99)
Glucose-Capillary: 115 mg/dL — ABNORMAL HIGH (ref 70–99)
Glucose-Capillary: 139 mg/dL — ABNORMAL HIGH (ref 70–99)

## 2021-12-16 LAB — CBC
HCT: 27.1 % — ABNORMAL LOW (ref 36.0–46.0)
Hemoglobin: 9.6 g/dL — ABNORMAL LOW (ref 12.0–15.0)
MCH: 32.8 pg (ref 26.0–34.0)
MCHC: 35.4 g/dL (ref 30.0–36.0)
MCV: 92.5 fL (ref 80.0–100.0)
Platelets: 202 10*3/uL (ref 150–400)
RBC: 2.93 MIL/uL — ABNORMAL LOW (ref 3.87–5.11)
RDW: 13.6 % (ref 11.5–15.5)
WBC: 17.5 10*3/uL — ABNORMAL HIGH (ref 4.0–10.5)
nRBC: 0 % (ref 0.0–0.2)

## 2021-12-16 LAB — ECHOCARDIOGRAM COMPLETE BUBBLE STUDY
AR max vel: 2.11 cm2
AV Area VTI: 2.23 cm2
AV Area mean vel: 2.22 cm2
AV Mean grad: 13 mmHg
AV Peak grad: 24.2 mmHg
Ao pk vel: 2.46 m/s
Area-P 1/2: 1.67 cm2
S' Lateral: 2.6 cm

## 2021-12-16 LAB — MAGNESIUM: Magnesium: 1.9 mg/dL (ref 1.7–2.4)

## 2021-12-16 LAB — PHOSPHORUS: Phosphorus: 3.8 mg/dL (ref 2.5–4.6)

## 2021-12-16 MED ORDER — VANCOMYCIN HCL 500 MG/100ML IV SOLN
500.0000 mg | INTRAVENOUS | Status: DC
  Filled 2021-12-16: qty 100

## 2021-12-16 MED ORDER — SODIUM CHLORIDE 0.9 % IV SOLN
2.0000 g | INTRAVENOUS | Status: AC
  Administered 2021-12-16 – 2021-12-23 (×8): 2 g via INTRAVENOUS
  Filled 2021-12-16 (×8): qty 12.5

## 2021-12-16 MED ORDER — VANCOMYCIN HCL IN DEXTROSE 1-5 GM/200ML-% IV SOLN
1000.0000 mg | Freq: Once | INTRAVENOUS | Status: AC
  Administered 2021-12-16: 1000 mg via INTRAVENOUS
  Filled 2021-12-16: qty 200

## 2021-12-16 NOTE — Progress Notes (Signed)
LTM EEG discontinued - no skin breakdown at unhook.   

## 2021-12-16 NOTE — Procedures (Signed)
Patient Name: Elizabeth Crosby  MRN: [id number]  Epilepsy Attending: Lora Havens  Referring Physician/Provider: Donnetta Simpers, MD  Duration: 12/15/2021 2325 to 12/16/2021 1122   Patient history: 76 y.o. female were with seizures and AMS. EEG to evaluate for seizure   Level of alertness: Awake, asleep   AEDs during EEG study: LEV   Technical aspects: This EEG study was done with scalp electrodes positioned according to the 10-20 International system of electrode placement. Electrical activity was reviewed with band pass filter of 1-'70Hz'$ , sensitivity of 7 uV/mm, display speed of 32m/sec with a '60Hz'$  notched filter applied as appropriate. EEG data were recorded continuously and digitally stored.  Video monitoring was available and reviewed as appropriate.   Description: No posterior dominant rhythm was seen. Sleep was characterized by vertex waves, sleep spindles (12 to 14 Hz), maximal frontocentral region. EEG showed continuous generalized 3 to 6 Hz theta-delta slowing. Hyperventilation and photic stimulation were not performed.     Of note, study was paused between 0120 on 12/16/2021 to 0242 as patient was taken for MRI. However when study resumes after MRI, there was significant electrode artifact until 0715 making study difficult to interpret.   ABNORMALITY - Continuous slow, generalized   IMPRESSION: This study is suggestive of moderate diffuse encephalopathy, nonspecific etiology. No seizures or epileptiform discharges were seen throughout the recording.   Timber Marshman OBarbra Sarks

## 2021-12-16 NOTE — Progress Notes (Signed)
NAME:  Elizabeth Crosby, MRN:  [id number], DOB:  04-14-1945, LOS: 2 ADMISSION DATE:  12/14/2021, CONSULTATION DATE: 9/15 REFERRING MD: Regenia Skeeter, CHIEF COMPLAINT: Seizure  History of Present Illness:  76 year old female patient with history as outlined below presented to the emergency room at [facility] On 9/15 when she was brought in from nursing home with altered mental status.  First noted earlier that a.m.  On EMS arrival she was found to be confused, drooling out of the left side of her mouth but following commands.  In route to the emergency room she had a witnessed episode lasting about 2 minutes of seizure-like activity.  She was intubated for airway protection.  A code stroke was called.  She was seen by telemetry neurology during that time was slightly agitated, CTA head was negative, she was negative for stroke.  She was felt at that time to be postictal from seizure.  As she was hypertensive neurology consultation included PRES on differential diagnosis.  She was loaded with Keppra, and transferred to [facility] for LTM EEG monitoring as well as plans for MRI of brain.  Critical care asked to admit.  Pertinent  Medical History  Hypertension, CKD stage IV glaucoma, gout, anxiety, insulin-dependent diabetes anemia, hyperlipidemia.  Poor dentition, falls at home  recent hospitalization from 7/5 to 7/11 for staph lugdunesis bacteremia, TEE was negative for vegetation.  Source not clear.  Course was complicated by acute encephalopathy as well as respiratory failure requiring ventilation.  Infectious disease follow-up felt source could potentially have been from a fall.  She completed 6 weeks of cefazolin. Significant Hospital Events: Including procedures, antibiotic start and stop dates in addition to other pertinent events   9/15 admitted with working diagnosis of seizure.  Seized in route to the emergency room and was unable to protect airway on arrival.  Intubated.  Code stroke  called.  CTA negative for stroke.  There is mild fusiform dilation of the internal carotid artery below the skull base bilaterally but this was negative for dissection.  Loaded with IV Keppra, transferred to [facility] for MRI of brain and LTM-EEG monitoring 9/17: MRI completed with areas of chronic siderosis felt possibly secondary to remote hypoxic ischemic event.  Echocardiogram with EF 70 to 75% no regional wall abnormality grade 1 diastolic dysfunction RV systolic function normal left atrial size mildly dilated agitated saline contrast bubble negative 9/18: MRI showing diffuse slowing but no active seizure.  No new acute injury, negative for findings consistent with PRES LTM discontinued neurology recommending to continue Keppra 500 mg twice daily  Interim History / Subjective:  EEG is negative, exam is nonfocal but following commands passing spontaneous breathing trial  Objective   Blood pressure (!) 137/42, pulse (!) 53, temperature (!) 97.1 F (36.2 C), temperature source Axillary, resp. rate 13, height '5\' 1"'$  (1.549 m), weight 85.7 kg, SpO2 100 %.    Vent Mode: PRVC FiO2 (%):  [30 %] 30 % Set Rate:  [14 bmp] 14 bmp Vt Set:  [400 mL] 400 mL PEEP:  [5 cmH20] 5 cmH20 Pressure Support:  [5 cmH20] 5 cmH20 Plateau Pressure:  [10 cmH20-12 cmH20] 12 cmH20   Intake/Output Summary (Last 24 hours) at 12/16/2021 0835 Last data filed at 12/16/2021 0831 Gross per 24 hour  Intake 3024.21 ml  Output 880 ml  Net 2144.21 ml   Filed Weights   12/14/21 1804  Weight: 85.7 kg    General this is a 76 year old white female she  appears chronically ill and quite debilitated she is currently in no acute distress on spontaneous breathing trial HEENT normocephalic atraumatic orally intubated has orogastric tube in place pupils equal reactive mucous membranes are dry there is a mark on her anterior tongue that appears to be sequela of biting likely from her seizure Pulmonary: Clear to auscultation excellent  tidal volume on pressure support of 5 in excess of 500 mL no accessory use noted Cardiac: Regular rate and rhythm does have systolic heart murmur Abdomen: Soft nontender no organomegaly Extremities: Warm dry, diffuse anasarca pulses palpable scattered areas of ecchymosis GU: Clear yellow Neuro: Opens eyes to command, diffusely weak but following commands and moving all extremities  Resolved Hospital Problem list     Assessment & Plan:  Principal Problem:   Seizure ([place]) Active Problems:   Acute metabolic encephalopathy   Acute respiratory failure (HCC)   Acute renal failure superimposed on stage 4 chronic kidney disease (HCC)   Leukocytosis   Sepsis (HCC)   Hypotension due to drugs   Normal anion gap metabolic acidosis   GERD (gastroesophageal reflux disease)   Morbid obesity (HCC)   DM2 (diabetes mellitus, type 2) (HCC)   History of endometrial cancer   Anemia   New onset seizure  No clear etiology.  MRI negative for new insult or acute process.  Has had remote history of what looks like prior hypoxic insult. Plan: LTM discontinue  Keppra 500 twice daily  Follow-up neurology outpatient   Acute metabolic encephalopathy, improved  Plan Supportive care  Acute respiratory failure secondary to ineffective airway management requiring intubation Respiratory alkalosis, improved/resolved -She has passed her spontaneous breathing trial Plan: Extubate N.p.o./aspiration/reflux precautions Wean oxygen, continue pulse oximetry, as needed chest x-ray  Sepsis, leukocytosis Hypotension, remains on pressors, question some of this being drug-related History of hypertension. Cultures are negative to date.  MRSA smear negative.  CBC was elevated as of 9/17 Pressor requirements: Weaning Plan Discontinue vancomycin Check procalcitonin Today's #2 cefepime, if cultures remain negative will complete 5 days total therapy Continue to wean pressors+  CKD stage IV with none anion gap  metabolic acidosis Plan Repeat chemistry today May need to add some mild bicarbonate replacement Renal dose medications Avoid hypovolemia  History of diabetes II on insulin Plan Goal glucose 140-180 Continue sliding scale insulin  GERD Plan PPI  History of anemia Plan Continue to monitor   Best Practice (right click and "Reselect all SmartList Selections" daily)   Diet/type: NPO w/ meds via tube DVT prophylaxis: SCD GI prophylaxis: PPI Lines: N/A Foley:  Yes, and it is still needed Code Status:  full code Last date of multidisciplinary goals of care discussion pending  My critical care time is 32 minutes  Erick Colace ACNP-BC [employer] Pager # [phone] OR # [phone] if no answer

## 2021-12-16 NOTE — Progress Notes (Signed)
  Echocardiogram 2D Echocardiogram has been performed.  Elizabeth Crosby 12/16/2021, 2:13 PM

## 2021-12-16 NOTE — Progress Notes (Signed)
Pt transported from 4N26 to MRI and back w/o complications. RT will cont to monitor as needed.

## 2021-12-16 NOTE — Progress Notes (Signed)
Pharmacy Antibiotic Note  Elizabeth Crosby is a 76 y.o. female admitted on 12/14/2021 with sepsis.  Pharmacy has been consulted for vancomycin and cefepime dosing.  ClCr 26 ml/min. WBC up 17.5. Afeb. CXR no acute processes. UA 9/15 unremarkable for infection.   9/17 Vancomycin '500mg'$  Q 24 hr Scr used: 1.87 mg/dL Weight: 85.7 kg Vd coeff: 0.5 L/kg Est AUC: 564  Plan: Cefepime 2g q 24 hr Vancomycin '1000mg'$  x1 then '500mg'$  q24 hr. Monitor cultures, clinical status, renal function, vancomycin level Narrow abx as able and f/u duration    Height: '5\' 1"'$  (154.9 cm) Weight: 85.7 kg (188 lb 15 oz) IBW/kg (Calculated) : 47.8  Temp (24hrs), Avg:98.6 F (37 C), Min:95 F (35 C), Max:99.3 F (37.4 C)  Recent Labs  Lab 12/14/21 1400 12/14/21 1509 12/14/21 2212 12/14/21 2256 12/15/21 0126 12/15/21 0407 12/16/21 0612  WBC 10.1  --   --   --   --  15.3* 17.5*  CREATININE 2.05* 2.30*  --  1.77* 1.80*  --  1.87*  LATICACIDVEN  --   --  1.2  --   --   --   --     Estimated Creatinine Clearance: 25.9 mL/min (A) (by C-G formula based on SCr of 1.87 mg/dL (H)).    Allergies  Allergen Reactions   Ozempic (0.25 Or 0.5 Mg-Dose) [Semaglutide(0.25 Or 0.'5mg'$ -Dos)] Nausea And Vomiting   Amoxil [Amoxicillin] Other (See Comments)    Cellulitis    Glucophage [Metformin] Diarrhea   Invokana [Canagliflozin] Other (See Comments)    UTI   Lipitor [Atorvastatin] Other (See Comments)    Myalgias   Lyrica Cr [Pregabalin Er] Other (See Comments)    Dizziness    Metformin And Related    Other Nausea And Vomiting    Pt states does not know drug but anesthesia drugs have made her have nausea and vomiting.    Sulfa Antibiotics Diarrhea   Trulicity [Dulaglutide] Other (See Comments)    GI Upset   Potassium-Containing Compounds Rash    Antimicrobials this admission: Vanc 9/17  >>  Cefe 9/17 >>   Dose adjustments this admission: N/a  Microbiology results: 9/16 BCx: ngtd 9/16 Sputum: pending  9/15  MRSA PCR: neg  Thank you for allowing pharmacy to be a part of this patient's care.  Benetta Spar, PharmD, BCPS, BCCP Clinical Pharmacist  Please check AMION for all [place] phone numbers After 10:00 PM, call [place] [phone]

## 2021-12-17 DIAGNOSIS — E872 Acidosis, unspecified: Secondary | ICD-10-CM | POA: Diagnosis present

## 2021-12-17 DIAGNOSIS — D72829 Elevated white blood cell count, unspecified: Secondary | ICD-10-CM | POA: Diagnosis present

## 2021-12-17 DIAGNOSIS — R569 Unspecified convulsions: Secondary | ICD-10-CM | POA: Diagnosis not present

## 2021-12-17 DIAGNOSIS — A419 Sepsis, unspecified organism: Secondary | ICD-10-CM | POA: Diagnosis present

## 2021-12-17 DIAGNOSIS — J9600 Acute respiratory failure, unspecified whether with hypoxia or hypercapnia: Secondary | ICD-10-CM | POA: Diagnosis not present

## 2021-12-17 DIAGNOSIS — I952 Hypotension due to drugs: Secondary | ICD-10-CM | POA: Diagnosis present

## 2021-12-17 LAB — GLUCOSE, CAPILLARY
Glucose-Capillary: 162 mg/dL — ABNORMAL HIGH (ref 70–99)
Glucose-Capillary: 123 mg/dL — ABNORMAL HIGH (ref 70–99)
Glucose-Capillary: 140 mg/dL — ABNORMAL HIGH (ref 70–99)
Glucose-Capillary: 127 mg/dL — ABNORMAL HIGH (ref 70–99)
Glucose-Capillary: 101 mg/dL — ABNORMAL HIGH (ref 70–99)

## 2021-12-17 LAB — BASIC METABOLIC PANEL
Anion gap: 5 (ref 5–15)
BUN: 62 mg/dL — ABNORMAL HIGH (ref 8–23)
CO2: 21 mmol/L — ABNORMAL LOW (ref 22–32)
Calcium: 8 mg/dL — ABNORMAL LOW (ref 8.9–10.3)
Chloride: 110 mmol/L (ref 98–111)
Creatinine, Ser: 1.88 mg/dL — ABNORMAL HIGH (ref 0.44–1.00)
GFR, Estimated: 28 mL/min — ABNORMAL LOW (ref >60)
Glucose, Bld: 162 mg/dL — ABNORMAL HIGH (ref 70–99)
Potassium: 4.2 mmol/L (ref 3.5–5.1)
Sodium: 136 mmol/L (ref 135–145)

## 2021-12-17 LAB — PROCALCITONIN: Procalcitonin: 2.31 ng/mL

## 2021-12-17 MED ORDER — MIDODRINE HCL 5 MG PO TABS
10.0000 mg | ORAL_TABLET | Freq: Three times a day (TID) | ORAL | Status: DC
  Administered 2021-12-18 – 2021-12-19 (×5): 10 mg via ORAL
  Filled 2021-12-17 (×5): qty 2

## 2021-12-17 MED ORDER — ORAL CARE MOUTH RINSE: 15.0000 mL | OROMUCOSAL | Status: DC | PRN

## 2021-12-17 MED ORDER — LEVETIRACETAM IN NACL 500 MG/100ML IV SOLN
500.0000 mg | Freq: Two times a day (BID) | INTRAVENOUS | Status: DC
  Administered 2021-12-17 – 2021-12-27 (×20): 500 mg via INTRAVENOUS
  Filled 2021-12-17 (×20): qty 100

## 2021-12-17 MED ORDER — LEVETIRACETAM 500 MG PO TABS
500.0000 mg | ORAL_TABLET | Freq: Two times a day (BID) | ORAL | Status: DC
  Filled 2021-12-17: qty 1

## 2021-12-17 NOTE — Progress Notes (Signed)
SLP Cancellation Note  Patient Details Name: Elizabeth Crosby MRN: [id number] DOB: 1945/09/17   Cancelled treatment:       Reason Eval/Treat Not Completed: Fatigue/lethargy limiting ability to participate  RN reports pt is too lethargic for swallow evaluation at this time. Pt was extubated at 1105 this morning. Will continue efforts.   Biannca Scantlin B. Quentin Ore, [facility], [place] Speech Language Pathologist Office: [phone]  Shonna Chock 12/17/2021, 1:31 PM

## 2021-12-17 NOTE — Progress Notes (Signed)
  Transition of Care [facility]) Screening Note   Patient Details  Name: BRYN SALINE Date of Birth: 1945-09-27   Transition of Care [facility]) CM/SW Contact:    Benard Halsted, LCSW Phone Number: 12/17/2021, 10:12 AM    Transition of Care Department [facility]) has reviewed patient from [facility]. We will continue to monitor patient advancement through interdisciplinary progression rounds. If new patient transition needs arise, please place a TOC consult.

## 2021-12-17 NOTE — Progress Notes (Addendum)
Nutrition Follow-up  DOCUMENTATION CODES:   Obesity unspecified  INTERVENTION:   Given recent hx of dysphagia after extubation in July, recommend SLP evaluation prior to advancing PO diet.  If unable to safely advance PO diet, recommend place Cortrak tube for enteral nutrition supplementation: Jevity 1.2 at 50 ml/h with Prosource TF20 60 ml daily would provide 1520 kcal, 87 gm protein, 972 ml free water daily.  Monitor for diet advancement, adequacy of meal intakes, and add PO supplements as needed.   NUTRITION DIAGNOSIS:   Inadequate oral intake related to acute illness as evidenced by NPO status.  Ongoing   GOAL:   Patient will meet greater than or equal to 90% of their needs  Unmet, extubated, OG tube removed  MONITOR:   Labs, TF tolerance, Weight trends  REASON FOR ASSESSMENT:   Consult Enteral/tube feeding initiation and management  ASSESSMENT:   Pt admitted from nursing home with AMS. Experienced seizure-like activity and was subsequently intubated for airway protection. PMH significant for HTN, CKD stage IV, glaucoma, gout, anxiety, T2DM, anemia, HLD. Recently admitted 7/5-7/11 or staph lugdunesis bacteremia  Patient was extubated this morning. OG tube has been removed and TF is off. Previously received Vital AF 1.2 at 20 ml/h. Currently NPO. She remains on one pressor.  On previous admission in July, patient required a dysphagia 3 diet with nectar thick liquids post extubation.   Labs reviewed.  CBG: [phone]  Medications reviewed and include Colace, Novolog, Keppra, Protonix, Miralax, Levophed.  UOP 1165 ml x 24 hours I/O +4.1 L since admission  Diet Order:   Diet Order             Diet NPO time specified  Diet effective now                   EDUCATION NEEDS:   No education needs have been identified at this time  Skin:  Skin Assessment: Reviewed RN Assessment  Last BM:  PTA,  no BM documented  Height:   Ht Readings from Last 1  Encounters:  12/14/21 '5\' 1"'$  (1.549 m)    Weight:   Wt Readings from Last 1 Encounters:  12/14/21 85.7 kg    Ideal Body Weight:  47.7 kg  BMI:  Body mass index is 35.7 kg/m.  Estimated Nutritional Needs:   Kcal:  1500-1700  Protein:  80-95 gm  Fluid:  >/=1.5L   Lucas Mallow RD, LDN, CNSC Please refer to Amion for contact information.

## 2021-12-17 NOTE — Progress Notes (Signed)
Critical care attending attestation note: I agree with the Advanced Practitioner's note, impression, and recommendations as outlined. I have taken an independent interval history, reviewed the chart and examined the patient. The following reflects my medical decision making and independent critical care time   Synopsis of assessment and plan: 76 year old female who presented with new onset seizure in the setting of prior hypoxic brain injury  EEG showed no active seizures, patient is tolerating spontaneous breathing trial   Physical exam: General: Critically ill-appearing female, orally intubated HEENT: [place]/AT, eyes anicteric.  ETT and OGT in place Neuro: Open his eyes with vocal stimuli, following simple commands, moving all 4 extremities Chest: Coarse breath sounds, no wheezes or rhonchi Heart: Regular rate and rhythm, no murmurs or gallops Abdomen: Soft, nontender, nondistended, bowel sounds present Skin: No rash  Labs and images were reviewed  Assessment and plan: New onset seizure in setting of prior hypoxic brain injury Acute metabolic encephalopathy, improved Acute respiratory failure with hypoxia Sepsis was ruled out CKD stage IV Non-anion gap metabolic acidosis Diabetes type 2 Obesity  Continue Keppra 500 mg twice daily Discontinue LTM Patient mental status has improved Patient is tolerating spontaneous breathing trial, will try to extubate him Monitor intake and output BMP in the morning Monitor fingerstick with goal 140-180   CRITICAL CARE Performed by: Jacky Kindle   Total independent critical care time: 30 minutes  Critical care time was exclusive of separately billable procedures and treating other patients.  Critical care was necessary to treat or prevent imminent or life-threatening deterioration.   Critical care was time spent personally by me on the following activities: development of treatment plan with patient and/or surrogate as well as nursing,  discussions with consultants, evaluation of patient's response to treatment, examination of patient, obtaining history from patient or surrogate, ordering and performing treatments and interventions, ordering and review of laboratory studies, ordering and review of radiographic studies, pulse oximetry, re-evaluation of patient's condition and participation in multidisciplinary rounds.  Jacky Kindle, MD [place] [facility] See Amion for pager If no response to pager, please call [phone] until 7pm After 7pm, Please call E-link [phone]  12/17/2021, 12:34 PM

## 2021-12-17 NOTE — IPAL (Signed)
Interdisciplinary Goals of Care Family Meeting   Date carried out:: 12/17/2021  Location of the meeting: Bedside  Member's involved: Physician, Bedside Registered Nurse, and Family Member or next of kin  Durable Power of Attorney or acting medical decision maker: [name]    Discussion: We discussed goals of care for Shirlee Limerick .    The Clinical status was relayed to daughter at bedside  in detail.   Updated and notified of patients medical condition.     Patient remains lethargic, having a weak cough and struggling to remove secretions.   Patient with increased WOB and using accessory muscles to breathe Explained to family course of therapy and the modalities   Patient's family would like to continue current treatment while keeping her DNR but if she gets worse or struggling, they would like to proceed with comfort care and not to reintubate  Code status: Full DNR  Disposition: Continue current acute care    Family are satisfied with Plan of action and management. All questions answered   Jacky Kindle MD [place] [facility] See Amion for pager If no response to pager, please call [phone] until 7pm After 7pm, Please call E-link [phone]

## 2021-12-17 NOTE — Procedures (Signed)
Extubation Procedure Note  Patient Details:   Name: DANYAL ADORNO DOB: Apr 09, 1945 MRN: [id number]   Airway Documentation:    Vent end date: 12/17/21 Vent end time: 1105   Evaluation  O2 sats: stable throughout Complications: No apparent complications Patient did tolerate procedure well. Bilateral Breath Sounds: Rhonchi, Diminished   Yes  RT extubated patient to 4L [place] per MD order with RN at bedside. Positive cuff leak noted. Patient tolerated well. No stridor noted at this time. RT will continue to monitor as needed.   Fabiola Backer 12/17/2021, 11:12 AM

## 2021-12-17 NOTE — Progress Notes (Signed)
Neurology progress note  S: Sedation has been weaned. Patient briskly following commands. EEG shows diffuse slowing with no electrographic seizure activity.  MRI brain wo contrast 1. No acute intracranial abnormality. 2. Unchanged bilateral perirolandic white matter hyperintense T2-weighted signal and chronic siderosis over both hemispheres. This may be a sequela of a remote hypoxic ischemic event.  CTA H&N, CTP 9/15 1. CT perfusion negative for acute infarct or ischemia. 2. Repeat head CT negative.  Aspects 10 3. No significant carotid or vertebral artery stenosis in the neck. Atherosclerotic calcification left carotid bulb without significant stenosis 4. Mild fusiform dilatation of the internal carotid artery below the skull base bilaterally. Negative for dissection.  CNS imaging personally reviewed; I agree with above interpretations  O:  Vitals:   12/17/21 0545 12/17/21 0600  BP: (!) 131/39 (!) 134/42  Pulse: (!) 59 60  Resp: 12 18  Temp: (!) 97.3 F (36.3 C) (!) 97.3 F (36.3 C)  SpO2: 100% 100%   Sedation not paused for exam 2/2 hemodynamic instability earlier today (on fentanyl)  Gen: intubated, off sedation CV: RRR Resp: ventilated  MS: intubated, briskly following commands Speech: intubated CN: PERRL, EOMI, (+) corneals, cough, gag Motor: at least 4/5 diffusely following commands briskly on both sides Sensory: SILT  A/P: 76 yo woman with new onset seizure intubated for airway protection and transferred from [facility] to [facility] for cEEG. LTM w/o electrographic seizure activity. MRI brain neg for new stroke, PRES, or other acute process. She remains afebrile w/o signs c/f CNS infection.  - D/c LTM - Continue keppra '500mg'$  bid - Extubate when CCM feels patient is ready - No further inpatient neurologic workup indicated. Continue keppra '500mg'$  bid at discharge. I will arrange outpatient neurology f/u. Neurology to sign off but please re-engage if additional neurologic  concerns arise.   Su Monks, MD [facility] [phone]  If 7pm- 7am, please page neurology on call as listed in [place].

## 2021-12-18 ENCOUNTER — Inpatient Hospital Stay (HOSPITAL_COMMUNITY): Payer: Medicare Other

## 2021-12-18 DIAGNOSIS — Z66 Do not resuscitate: Secondary | ICD-10-CM | POA: Diagnosis present

## 2021-12-18 DIAGNOSIS — J9601 Acute respiratory failure with hypoxia: Secondary | ICD-10-CM

## 2021-12-18 DIAGNOSIS — E43 Unspecified severe protein-calorie malnutrition: Secondary | ICD-10-CM | POA: Diagnosis present

## 2021-12-18 DIAGNOSIS — N184 Chronic kidney disease, stage 4 (severe): Secondary | ICD-10-CM | POA: Diagnosis not present

## 2021-12-18 DIAGNOSIS — R569 Unspecified convulsions: Secondary | ICD-10-CM | POA: Diagnosis not present

## 2021-12-18 DIAGNOSIS — J151 Pneumonia due to Pseudomonas: Secondary | ICD-10-CM | POA: Diagnosis present

## 2021-12-18 DIAGNOSIS — G9341 Metabolic encephalopathy: Secondary | ICD-10-CM | POA: Diagnosis not present

## 2021-12-18 DIAGNOSIS — R29898 Other symptoms and signs involving the musculoskeletal system: Secondary | ICD-10-CM | POA: Diagnosis present

## 2021-12-18 LAB — BASIC METABOLIC PANEL
Anion gap: 5 (ref 5–15)
BUN: 59 mg/dL — ABNORMAL HIGH (ref 8–23)
CO2: 21 mmol/L — ABNORMAL LOW (ref 22–32)
Calcium: 8.2 mg/dL — ABNORMAL LOW (ref 8.9–10.3)
Chloride: 112 mmol/L — ABNORMAL HIGH (ref 98–111)
Creatinine, Ser: 1.74 mg/dL — ABNORMAL HIGH (ref 0.44–1.00)
GFR, Estimated: 30 mL/min — ABNORMAL LOW (ref >60)
Glucose, Bld: 100 mg/dL — ABNORMAL HIGH (ref 70–99)
Potassium: 4.2 mmol/L (ref 3.5–5.1)
Sodium: 138 mmol/L (ref 135–145)

## 2021-12-18 LAB — GLUCOSE, CAPILLARY
Glucose-Capillary: 102 mg/dL — ABNORMAL HIGH (ref 70–99)
Glucose-Capillary: 133 mg/dL — ABNORMAL HIGH (ref 70–99)
Glucose-Capillary: 169 mg/dL — ABNORMAL HIGH (ref 70–99)
Glucose-Capillary: 91 mg/dL (ref 70–99)
Glucose-Capillary: 92 mg/dL (ref 70–99)
Glucose-Capillary: 94 mg/dL (ref 70–99)

## 2021-12-18 LAB — PROCALCITONIN: Procalcitonin: 1.62 ng/mL

## 2021-12-18 MED ORDER — JEVITY 1.5 CAL/FIBER PO LIQD
1000.0000 mL | ORAL | Status: DC
  Filled 2021-12-18: qty 1000

## 2021-12-18 MED ORDER — JEVITY 1.2 CAL PO LIQD
1000.0000 mL | ORAL | Status: DC
  Administered 2021-12-18 – 2021-12-27 (×9): 1000 mL
  Filled 2021-12-18 (×5): qty 1000

## 2021-12-18 MED ORDER — FUROSEMIDE 10 MG/ML IJ SOLN
40.0000 mg | Freq: Once | INTRAMUSCULAR | Status: AC
  Administered 2021-12-18: 40 mg via INTRAVENOUS
  Filled 2021-12-18: qty 4

## 2021-12-18 MED ORDER — PROSOURCE TF20 ENFIT COMPATIBL EN LIQD
60.0000 mL | Freq: Every day | ENTERAL | Status: DC
  Administered 2021-12-18 – 2021-12-27 (×10): 60 mL
  Filled 2021-12-18 (×10): qty 60

## 2021-12-18 NOTE — Progress Notes (Signed)
Nutrition Follow-up  DOCUMENTATION CODES:   Obesity unspecified  INTERVENTION:   TF via Cortrak tube:  Jevity 1.2 at 50 ml/h  Prosource TF20 60 ml daily   Provides 1520 kcal, 87 gm protein, 972 ml free water daily.   NUTRITION DIAGNOSIS:   Inadequate oral intake related to acute illness as evidenced by NPO status.  Ongoing   GOAL:   Patient will meet greater than or equal to 90% of their needs  Unmet, extubated, OG tube removed  MONITOR:   Labs, TF tolerance, Weight trends  REASON FOR ASSESSMENT:   Consult Enteral/tube feeding initiation and management  ASSESSMENT:   Pt admitted from nursing home with AMS. Experienced seizure-like activity and was subsequently intubated for airway protection. PMH significant for HTN, CKD stage IV, glaucoma, gout, anxiety, T2DM, anemia, HLD. Recently admitted 7/5-7/11 or staph lugdunesis bacteremia  Pt discussed during ICU rounds and with RN and MD.  Pt extubated but failed swallow. Cortrak placed  9/15 admitted with seizures 9/18 per MRI pt with PRES, extubated 9/19 s/p cortrak placement; tip likely in distal stomach or proximal duodenum   Labs reviewed.  CBG: 91-127  Medications reviewed and include Colace, Lasix, Novolog, Protonix, Miralax  Diet Order:   Diet Order             Diet NPO time specified  Diet effective now                   EDUCATION NEEDS:   No education needs have been identified at this time  Skin:  Skin Assessment: Reviewed RN Assessment  Last BM:  PTA,  no BM documented  Height:   Ht Readings from Last 1 Encounters:  12/14/21 '5\' 1"'$  (1.549 m)    Weight:   Wt Readings from Last 1 Encounters:  12/18/21 83 kg    Ideal Body Weight:  47.7 kg  BMI:  Body mass index is 34.57 kg/m.  Estimated Nutritional Needs:   Kcal:  1500-1700  Protein:  80-95 gm  Fluid:  >/=1.5L  Charis Juliana P., RD, LDN, CNSC See AMiON for contact information

## 2021-12-18 NOTE — Procedures (Signed)
Cortrak ° °Tube Type:  Cortrak - 43 inches °Tube Location:  Left nare °Initial Placement:  Stomach °Secured by: Bridle °Technique Used to Measure Tube Placement:  Marking at nare/corner of mouth °Cortrak Secured At:  60 cm ° °Cortrak Tube Team Note: ° °Consult received to place a Cortrak feeding tube.  ° °X-ray is required, abdominal x-ray has been ordered by the Cortrak team. Please confirm tube placement before using the Cortrak tube.  ° °If the tube becomes dislodged please keep the tube and contact the Cortrak team at [email or web address] (password [email or web address]) for replacement.  °If after hours and replacement cannot be delayed, place a NG tube and confirm placement with an abdominal x-ray.  ° ° °Tomeeka Plaugher MS, RD, LDN °Please refer to AMION for RD and/or RD on-call/weekend/after hours pager ° ° °

## 2021-12-18 NOTE — Progress Notes (Signed)
NAME:  Elizabeth Crosby, MRN:  [id number], DOB:  13-Apr-1945, LOS: 4 ADMISSION DATE:  12/14/2021, CONSULTATION DATE: 9/15 REFERRING MD: Regenia Skeeter, CHIEF COMPLAINT: Seizure  History of Present Illness:  76 year old female patient with history as outlined below presented to the emergency room at [facility] On 9/15 when she was brought in from nursing home with altered mental status.  First noted earlier that a.m.  On EMS arrival she was found to be confused, drooling out of the left side of her mouth but following commands.  In route to the emergency room she had a witnessed episode lasting about 2 minutes of seizure-like activity.  She was intubated for airway protection.  A code stroke was called.  She was seen by telemetry neurology during that time was slightly agitated, CTA head was negative, she was negative for stroke.  She was felt at that time to be postictal from seizure.  As she was hypertensive neurology consultation included PRES on differential diagnosis.  She was loaded with Keppra, and transferred to [facility] for LTM EEG monitoring as well as plans for MRI of brain.  Critical care asked to admit.  Pertinent  Medical History  Hypertension, CKD stage IV glaucoma, gout, anxiety, insulin-dependent diabetes anemia, hyperlipidemia.  Poor dentition, falls at home  recent hospitalization from 7/5 to 7/11 for staph lugdunesis bacteremia, TEE was negative for vegetation.  Source not clear.  Course was complicated by acute encephalopathy as well as respiratory failure requiring ventilation.  Infectious disease follow-up felt source could potentially have been from a fall.  She completed 6 weeks of cefazolin. Significant Hospital Events: Including procedures, antibiotic start and stop dates in addition to other pertinent events   9/15 admitted with working diagnosis of seizure.  Seized in route to the emergency room and was unable to protect airway on arrival.  Intubated.  Code stroke  called.  CTA negative for stroke.  There is mild fusiform dilation of the internal carotid artery below the skull base bilaterally but this was negative for dissection.  Loaded with IV Keppra, transferred to [facility] for MRI of brain and LTM-EEG monitoring 9/17: MRI completed with areas of chronic siderosis felt possibly secondary to remote hypoxic ischemic event.  Echocardiogram with EF 70 to 75% no regional wall abnormality grade 1 diastolic dysfunction RV systolic function normal left atrial size mildly dilated agitated saline contrast bubble negative 9/18: MRI showing diffuse slowing but no active seizure.  No new acute injury, negative for findings consistent with PRES LTM discontinued neurology recommending to continue Keppra 500 mg twice daily.  Extubated.  Very awake postextubation.  Concern for need for reintubation, had goals of care discussion.  Patient DO NOT RESUSCITATE, no reintubation. 9/19 remaining weak, ready for transfer to progressive.  Interim History / Subjective:  Very weak, but oriented.  Able to tell me she had a seizure Objective   Blood pressure (Abnormal) 131/41, pulse 84, temperature 98.7 F (37.1 C), temperature source Axillary, resp. rate 12, height '5\' 1"'$  (1.549 m), weight 83 kg, SpO2 98 %.        Intake/Output Summary (Last 24 hours) at 12/18/2021 0902 Last data filed at 12/18/2021 5053 Gross per 24 hour  Intake 544.6 ml  Output 900 ml  Net -355.4 ml   Filed Weights   12/14/21 1804 12/18/21 0340  Weight: 85.7 kg 83 kg   General this is a very weak, very deconditioned 76 year old female who appears much older than stated age 24 mucous  membranes dry no neck vein distention pupils equal reactive Pulmonary: Some scattered rhonchi no accessory use currently on room air Cardiac regular rate and rhythm Abdomen soft not tender Extremities warm dry diffuse anasarca primarily in the upper extremities Neuro slow to awaken, follows commands, moves all extremities  equally weak everywhere.  She is oriented x3 GU clear yellow  Resolved Hospital Problem list   Normal anion gap metabolic acidosis resolved 9/19 Drug-related hypotension resolved postextubation sepsis Assessment & Plan:  Principal Problem:   Seizures ([place]) Active Problems:   Acute metabolic encephalopathy   Pseudomonas pneumonia (HCC)   Acute renal failure superimposed on stage 4 chronic kidney disease (HCC)   Leukocytosis   GERD (gastroesophageal reflux disease)   Morbid obesity (HCC)   DM2 (diabetes mellitus, type 2) (HCC)   History of endometrial cancer   Anemia   Protein-calorie malnutrition, severe (HCC)   Severe muscle deconditioning   DNR (do not resuscitate)   New onset seizure  No clear etiology.  MRI negative for new insult or acute process.  Has had remote history of what looks like prior hypoxic insult. Plan: Keppra 500 twice daily Follow-up with outpatient neurology   Acute metabolic encephalopathy, improved  Plan Supportive care  Severe protein calorie malnutrition and deconditioning Plan Will need extensive physical therapy, suspect she will need nasogastric feeding tube  Acute respiratory failure secondary to ineffective airway management requiring intubation Respiratory alkalosis, improved/resolved -She has passed her spontaneous breathing trial, but her ability to protect airway is marginal  Plan: Pulmonary hygiene Pulse oximetry Full DNR, not a candidate for BiPAP, DO NOT INTUBATE. Swallow evaluation  Pseudomonas pneumonia, leukocytosis Cultures are negative to date.  MRSA smear negative.  CBC was elevated as of 9/17 Pressor requirements: Weaning Plan D # 3 cefepime, sputum showing few Pseudomonas, at this point we will plan on 8 to 10 days pending sensitivities Trend procalcitonin   CKD stage IV, total fluid volume overload Serum creatinine slightly improved  plan Continue to monitor Renal dose medication above reviewed Avoid hypovolemia  with his monthly Lasix x1  History of diabetes II on insulin Plan Glucose goal 140-180 Sliding scale insulin   GERD Plan PPI  History of anemia Plan Continue to monitor   Best Practice (right click and "Reselect all SmartList Selections" daily)   Diet/type: NPO DVT prophylaxis: prophylactic heparin  GI prophylaxis: PPI Lines: N/A Foley:  N/A Code Status:  DNR Last date of multidisciplinary goals of care 9/18   Ready for transfer  Erick Colace ACNP-BC [place] Pager # [phone] OR # [phone] if no answer

## 2021-12-18 NOTE — Evaluation (Signed)
Physical Therapy Evaluation Patient Details Name: Elizabeth Crosby MRN: [id number] DOB: 26-Nov-1945 Today's Date: 12/18/2021  History of Present Illness  76 yo female admitted from SNF with new onset seizure requiring intubation 9/15-9/18. MRI negative for acute findings.  PMH: CKD III, DM, GERD, anxiety, gout, HLD, HTN, and obesity.   Clinical Impression  Pt admitted with above diagnosis. Pt has been at a SNF since discharge from hospital 09/2021. She lived at home mod I prior to that hospitalization. Pt currently with functional limitations due to the deficits listed below (see PT Problem List). On eval, pt required total assist bed mobility. Unable to tolerate transfer to EOB due to generalized pain and weakness. BUE/LE strength < 3/5. Edema noted BUE. Pain with palpation and ROM BUE. Pt very lethargic. Opening eyes on command but otherwise keeping them closed.  Responsive to questions with short answers. Pt will benefit from skilled PT to increase their independence and safety with mobility to allow discharge to the venue listed below.          Recommendations for follow up therapy are one component of a multi-disciplinary discharge planning process, led by the attending physician.  Recommendations may be updated based on patient status, additional functional criteria and insurance authorization.  Follow Up Recommendations Skilled nursing-short term rehab (<3 hours/day) Can patient physically be transported by private vehicle: No    Assistance Recommended at Discharge Frequent or constant Supervision/Assistance  Patient can return home with the following       Equipment Recommendations None recommended by PT  Recommendations for Other Services       Functional Status Assessment Patient has had a recent decline in their functional status and/or demonstrates limited ability to make significant improvements in function in a reasonable and predictable amount of time     Precautions /  Restrictions Precautions Precautions: Fall;Other (comment) Precaution Comments: coretrak      Mobility  Bed Mobility Overal bed mobility: Needs Assistance Bed Mobility: Rolling Rolling: Total assist         General bed mobility comments: total assist rolling R/L. Unable to attempt supine to sit due to pain and weakness. Bed placed in chair position. Total assist to pull forward to sit.    Transfers                   General transfer comment: deferred/unable    Ambulation/Gait                  Stairs            Wheelchair Mobility    Modified Rankin (Stroke Patients Only)       Balance                                             Pertinent Vitals/Pain Pain Assessment Pain Assessment: Faces Faces Pain Scale: Hurts even more Pain Location: BUE with ROM Pain Descriptors / Indicators: Grimacing, Guarding, Moaning Pain Intervention(s): Limited activity within patient's tolerance, Repositioned    Home Living Family/patient expects to be discharged to:: Skilled nursing facility                   Additional Comments: Pt has been at [facility] since d/c from hospital July 2023. She lived at home I/mod I prior to that time.    Prior Function Prior Level of Function : Needs  assist       Physical Assist : Mobility (physical);ADLs (physical)     Mobility Comments: Mod I with cane vs RW prior to 09/2021 hospitalization. Pt required mod assist during that hospital stay and d/c'd to SNF. Pt has been at SNF until current hospitalization. Unsure if she was still active with therapy at SNF.       Hand Dominance   Dominant Hand: Right    Extremity/Trunk Assessment   Upper Extremity Assessment Upper Extremity Assessment: RUE deficits/detail;LUE deficits/detail RUE Deficits / Details: profound weakness < 3/5. Edema noted. Tender to touch and ROM. RUE: Unable to fully assess due to pain LUE Deficits / Details: profound weakness <  3/5. Edema noted. Tender to touch and ROM. LUE: Unable to fully assess due to pain    Lower Extremity Assessment Lower Extremity Assessment: RLE deficits/detail;LLE deficits/detail RLE Deficits / Details: profound weakness, < 3/5 LLE Deficits / Details: profound weakness, < 3/5       Communication   Communication: No difficulties (slowed speech)  Cognition Arousal/Alertness: Lethargic Behavior During Therapy: Flat affect Overall Cognitive Status: Difficult to assess                                 General Comments: Lethargic but opening eyes on command. Following simple commands with increased time. Able to state she was in the hospital. When asked the city, she stated [place]. Stated the year as 2003.        General Comments General comments (skin integrity, edema, etc.): SpO2 97% on RA. HR in 80s. BP 126/36. No significant chane in vitals when bed placed in chair position.    Exercises     Assessment/Plan    PT Assessment Patient needs continued PT services  PT Problem List Decreased strength;Decreased mobility;Cardiopulmonary status limiting activity;Decreased activity tolerance;Decreased balance;Pain       PT Treatment Interventions Therapeutic activities;Therapeutic exercise;Patient/family education;Balance training;Functional mobility training;Gait training    PT Goals (Current goals can be found in the Care Plan section)  Acute Rehab PT Goals Patient Stated Goal: not stated PT Goal Formulation: Patient unable to participate in goal setting Time For Goal Achievement: 01/01/22 Potential to Achieve Goals: Fair    Frequency Min 2X/week     Co-evaluation               AM-PAC PT "6 Clicks" Mobility  Outcome Measure Help needed turning from your back to your side while in a flat bed without using bedrails?: Total Help needed moving from lying on your back to sitting on the side of a flat bed without using bedrails?: Total Help needed moving to  and from a bed to a chair (including a wheelchair)?: Total Help needed standing up from a chair using your arms (e.g., wheelchair or bedside chair)?: Total Help needed to walk in hospital room?: Total Help needed climbing 3-5 steps with a railing? : Total 6 Click Score: 6    End of Session   Activity Tolerance: Patient limited by lethargy;Patient limited by pain Patient left: in bed;with call bell/phone within reach;with bed alarm set Nurse Communication: Mobility status PT Visit Diagnosis: Other abnormalities of gait and mobility (R26.89);Pain;Muscle weakness (generalized) (M62.81)    Time: 2952-8413 PT Time Calculation (min) (ACUTE ONLY): 16 min   Charges:   PT Evaluation $PT Eval Moderate Complexity: 1 Mod          Lorrin Goodell, PT  Office # [phone]  Pager [phone]   Lorriane Shire 12/18/2021, 12:35 PM

## 2021-12-18 NOTE — Evaluation (Signed)
Clinical/Bedside Swallow Evaluation Patient Details  Name: Elizabeth Crosby MRN: [id number] Date of Birth: 1945-06-19  Today's Date: 12/18/2021 Time: SLP Start Time (ACUTE ONLY): 0903 SLP Stop Time (ACUTE ONLY): 0915 SLP Time Calculation (min) (ACUTE ONLY): 12 min  Past Medical History:  Past Medical History:  Diagnosis Date   Anxiety    Arthritis    "knees are bone on bone and i see dr. Tonita Cong for the cortisone injections "    Cellulitis    hx  of in the legs    Chronic kidney disease (CKD), stage III (moderate) (HCC)    Complication of anesthesia    doesnt know the name of the anesthesia , but i was vomiting while i was still under    Diabetes mellitus without complication (HCC)    type 2    GERD (gastroesophageal reflux disease)    Glaucoma    Gout    History of blood transfusion    as child after accident, after childbirth/hemorroid surgery   History of kidney stones    History of radiation therapy    vaginal brachytherapy  11/27/2017-12/25/2017   Dr Gery Pray   Hyperlipidemia    Hypertension    Leukocytes in urine    Lumbar back pain    Sleep apnea    non adherant with cpap device    Past Surgical History:  Past Surgical History:  Procedure Laterality Date   APPENDECTOMY  1972   CATARACT EXTRACTION W/PHACO Right 01/10/2014   Procedure: CATARACT EXTRACTION PHACO AND INTRAOCULAR LENS PLACEMENT ([place]);  Surgeon: Tonny Branch, MD;  Location: [facility];  Service: Ophthalmology;  Laterality: Right;  CDE:5.78   CATARACT EXTRACTION W/PHACO Left 01/27/2014   Procedure: CATARACT EXTRACTION PHACO AND INTRAOCULAR LENS PLACEMENT LEFT EYE CDE=5.75;  Surgeon: Tonny Branch, MD;  Location: [facility];  Service: Ophthalmology;  Laterality: Left;   CHOLECYSTECTOMY  1972   COLONOSCOPY WITH PROPOFOL N/A 09/01/2012   Procedure: COLONOSCOPY WITH PROPOFOL;  Surgeon: Garlan Fair, MD;  Location: [facility];  Service: Endoscopy;  Laterality: N/A;   KIDNEY STONE SURGERY     OMENTECTOMY  09/30/2017    Procedure: OMENTECTOMY;  Surgeon: [name], MD;  Location: [facility];  Service: Gynecology;;   ROBOTIC ASSISTED TOTAL HYSTERECTOMY WITH BILATERAL SALPINGO OOPHERECTOMY Bilateral 09/30/2017   Procedure: ROBOTIC ASSISTED TOTAL HYSTERECTOMY WITH BILATERAL SALPINGO OOPHORECTOMY;  Surgeon: [name], MD;  Location: [facility];  Service: Gynecology;  Laterality: Bilateral;   HPI:  Pt is a 76 yo female admitted from SNF new onset seizure requiring intubation 9/15-9/18. MRI negative for acute findings. Pt seen by SLP in July 2023 for post-extubation dysphagia. MBS 10/05/21 showed aspiration (PAS 7) of thin liquids, recommending Dys 3 diet and nectar thick liquids. She was advanced to regular solids/thin liquids prior to d/c as she was able to consistently take small, single sips. PMH incluides: HTN, CKD, glaucoma, gout, anxiety, HLD    Assessment / Plan / Recommendation  Clinical Impression  Pt is lethargic but also presenting with signs that are concerning for dysphagia and possible aspiration. She awakens to stimulation and follows commands, but remains groggy and falls back to sleep quickly if not being actively cued. She doesn't seal her lips around a cup and anterior spill from the R is noted even when liquids are administered via spoon. With ice chips and small amounts of water she has upwards of at least 5-6 subswallows per bolus with frequent coughing noted. Recommend that she remain NPO for now, allowing  additional time post-extubation and for mentation to start to clear. SLP Visit Diagnosis: Dysphagia, unspecified (R13.10)    Aspiration Risk  Moderate aspiration risk;Risk for inadequate nutrition/hydration    Diet Recommendation NPO   Medication Administration: Via alternative means    Other  Recommendations Oral Care Recommendations: Oral care QID Other Recommendations: Have oral suction available    Recommendations for follow up therapy are one component of a multi-disciplinary discharge planning  process, led by the attending physician.  Recommendations may be updated based on patient status, additional functional criteria and insurance authorization.  Follow up Recommendations  (tba)      Assistance Recommended at Discharge    Functional Status Assessment Patient has had a recent decline in their functional status and demonstrates the ability to make significant improvements in function in a reasonable and predictable amount of time.  Frequency and Duration min 2x/week  2 weeks       Prognosis Prognosis for Safe Diet Advancement: Good Barriers to Reach Goals: Cognitive deficits      Swallow Study   General HPI: Pt is a 76 yo female admitted from SNF new onset seizure requiring intubation 9/15-9/18. MRI negative for acute findings. Pt seen by SLP in July 2023 for post-extubation dysphagia. MBS 10/05/21 showed aspiration (PAS 7) of thin liquids, recommending Dys 3 diet and nectar thick liquids. She was advanced to regular solids/thin liquids prior to d/c as she was able to consistently take small, single sips. PMH incluides: HTN, CKD, glaucoma, gout, anxiety, HLD Type of Study: Bedside Swallow Evaluation Previous Swallow Assessment: see HPI Diet Prior to this Study: NPO Temperature Spikes Noted: No Respiratory Status: Room air History of Recent Intubation: Yes Length of Intubations (days): 4 days Date extubated: 12/17/21 Behavior/Cognition: Lethargic/Drowsy;Cooperative;Pleasant mood;Requires cueing Oral Cavity Assessment: Other (comment) (smalla mount of bruising on L side of tongue) Oral Care Completed by SLP: Yes Oral Cavity - Dentition: Adequate natural dentition Self-Feeding Abilities: Total assist Patient Positioning: Upright in bed Baseline Vocal Quality: Hoarse Volitional Cough: Weak Volitional Swallow: Able to elicit    Oral/Motor/Sensory Function Overall Oral Motor/Sensory Function: Generalized oral weakness   Ice Chips Ice chips: Impaired Presentation:  Spoon Pharyngeal Phase Impairments: Multiple swallows;Cough - Immediate   Thin Liquid Thin Liquid: Impaired Presentation: Spoon;Cup Oral Phase Impairments: Poor awareness of bolus;Reduced labial seal Oral Phase Functional Implications: Right anterior spillage Pharyngeal  Phase Impairments: Multiple swallows;Cough - Delayed    Nectar Thick Nectar Thick Liquid: Not tested   Honey Thick Honey Thick Liquid: Not tested   Puree Puree: Not tested   Solid     Solid: Not tested      Osie Bond., M.A. [place] Office [phone]  Secure chat preferred  12/18/2021,9:25 AM

## 2021-12-19 ENCOUNTER — Inpatient Hospital Stay: Payer: Self-pay

## 2021-12-19 DIAGNOSIS — R569 Unspecified convulsions: Secondary | ICD-10-CM | POA: Diagnosis not present

## 2021-12-19 DIAGNOSIS — D72829 Elevated white blood cell count, unspecified: Secondary | ICD-10-CM

## 2021-12-19 DIAGNOSIS — E1169 Type 2 diabetes mellitus with other specified complication: Secondary | ICD-10-CM | POA: Diagnosis not present

## 2021-12-19 DIAGNOSIS — G9341 Metabolic encephalopathy: Secondary | ICD-10-CM | POA: Diagnosis not present

## 2021-12-19 DIAGNOSIS — J9600 Acute respiratory failure, unspecified whether with hypoxia or hypercapnia: Secondary | ICD-10-CM | POA: Diagnosis not present

## 2021-12-19 DIAGNOSIS — J151 Pneumonia due to Pseudomonas: Secondary | ICD-10-CM

## 2021-12-19 DIAGNOSIS — E43 Unspecified severe protein-calorie malnutrition: Secondary | ICD-10-CM

## 2021-12-19 LAB — BASIC METABOLIC PANEL
Anion gap: 7 (ref 5–15)
BUN: 64 mg/dL — ABNORMAL HIGH (ref 8–23)
CO2: 22 mmol/L (ref 22–32)
Calcium: 8.7 mg/dL — ABNORMAL LOW (ref 8.9–10.3)
Chloride: 113 mmol/L — ABNORMAL HIGH (ref 98–111)
Creatinine, Ser: 1.3 mg/dL — ABNORMAL HIGH (ref 0.44–1.00)
GFR, Estimated: 43 mL/min — ABNORMAL LOW (ref >60)
Glucose, Bld: 111 mg/dL — ABNORMAL HIGH (ref 70–99)
Potassium: 4.5 mmol/L (ref 3.5–5.1)
Sodium: 142 mmol/L (ref 135–145)

## 2021-12-19 LAB — CBC WITH DIFFERENTIAL/PLATELET
Abs Immature Granulocytes: 0.04 10*3/uL (ref 0.00–0.07)
Basophils Absolute: 0.1 10*3/uL (ref 0.0–0.1)
Basophils Relative: 1 %
Eosinophils Absolute: 0.2 10*3/uL (ref 0.0–0.5)
Eosinophils Relative: 2 %
HCT: 25.3 % — ABNORMAL LOW (ref 36.0–46.0)
Hemoglobin: 8.8 g/dL — ABNORMAL LOW (ref 12.0–15.0)
Immature Granulocytes: 0 %
Lymphocytes Relative: 23 %
Lymphs Abs: 2.5 10*3/uL (ref 0.7–4.0)
MCH: 32.6 pg (ref 26.0–34.0)
MCHC: 34.8 g/dL (ref 30.0–36.0)
MCV: 93.7 fL (ref 80.0–100.0)
Monocytes Absolute: 1.5 10*3/uL — ABNORMAL HIGH (ref 0.1–1.0)
Monocytes Relative: 14 %
Neutro Abs: 6.5 10*3/uL (ref 1.7–7.7)
Neutrophils Relative %: 60 %
Platelets: 192 10*3/uL (ref 150–400)
RBC: 2.7 MIL/uL — ABNORMAL LOW (ref 3.87–5.11)
RDW: 14.1 % (ref 11.5–15.5)
WBC: 10.9 10*3/uL — ABNORMAL HIGH (ref 4.0–10.5)
nRBC: 0 % (ref 0.0–0.2)

## 2021-12-19 LAB — PROCALCITONIN: Procalcitonin: 1.77 ng/mL

## 2021-12-19 LAB — GLUCOSE, CAPILLARY
Glucose-Capillary: 129 mg/dL — ABNORMAL HIGH (ref 70–99)
Glucose-Capillary: 138 mg/dL — ABNORMAL HIGH (ref 70–99)
Glucose-Capillary: 150 mg/dL — ABNORMAL HIGH (ref 70–99)
Glucose-Capillary: 154 mg/dL — ABNORMAL HIGH (ref 70–99)
Glucose-Capillary: 160 mg/dL — ABNORMAL HIGH (ref 70–99)
Glucose-Capillary: 170 mg/dL — ABNORMAL HIGH (ref 70–99)

## 2021-12-19 LAB — CULTURE, RESPIRATORY W GRAM STAIN

## 2021-12-19 MED ORDER — INSULIN DETEMIR 100 UNIT/ML ~~LOC~~ SOLN
5.0000 [IU] | Freq: Every day | SUBCUTANEOUS | Status: DC
  Administered 2021-12-19 – 2021-12-23 (×5): 5 [IU] via SUBCUTANEOUS
  Filled 2021-12-19 (×6): qty 0.05

## 2021-12-19 MED ORDER — MIDODRINE HCL 5 MG PO TABS
10.0000 mg | ORAL_TABLET | Freq: Three times a day (TID) | ORAL | Status: DC
  Administered 2021-12-20: 10 mg
  Filled 2021-12-19: qty 2

## 2021-12-19 MED ORDER — CHLORHEXIDINE GLUCONATE CLOTH 2 % EX PADS
6.0000 | MEDICATED_PAD | Freq: Every day | CUTANEOUS | Status: DC
  Administered 2021-12-19 – 2021-12-28 (×10): 6 via TOPICAL

## 2021-12-19 NOTE — Progress Notes (Signed)
Peripherally Inserted Central Catheter Placement  The IV Nurse has discussed with the patient and/or persons authorized to consent for the patient, the purpose of this procedure and the potential benefits and risks involved with this procedure.  The benefits include less needle sticks, lab draws from the catheter, and the patient may be discharged home with the catheter. Risks include, but not limited to, infection, bleeding, blood clot (thrombus formation), and puncture of an artery; nerve damage and irregular heartbeat and possibility to perform a PICC exchange if needed/ordered by physician.  Alternatives to this procedure were also discussed.  Bard Power PICC patient education guide, fact sheet on infection prevention and patient information card has been provided to patient /or left at bedside.    PICC Placement Documentation  PICC Single Lumen 61/22/44 Right Basilic 38 cm 0 cm (Active)  Indication for Insertion or Continuance of Line Prolonged intravenous therapies 12/19/21 1404  Exposed Catheter (cm) 0 cm 12/19/21 1404  Site Assessment Clean, Dry, Intact;Bruised 12/19/21 1404  Line Status Flushed;Saline locked;Blood return noted 12/19/21 1404  Dressing Type Transparent;Securing device 12/19/21 1404  Dressing Status Antimicrobial disc in place;Clean, Dry, Intact 12/19/21 1404  Safety Lock Not Applicable 97/53/00 5110  Line Adjustment (NICU/IV Team Only) No 12/19/21 1404  Dressing Intervention Other (Comment) 12/19/21 1404  Dressing Change Due 12/26/21 12/19/21 [place], [place] 12/19/2021, 2:06 PM

## 2021-12-19 NOTE — Progress Notes (Signed)
9/20 Pt unable to sign for IM Letter. Made a good faith effort to contact listings, but no response or call backs.

## 2021-12-19 NOTE — Progress Notes (Signed)
Pharmacy Antibiotic Note  Elizabeth Crosby is a 76 y.o. female admitted on 12/14/2021 with sepsis, found to have pneumonia and required intubation.  Pharmacy has been consulted for cefepime dosing.  Plan: Continue cefepime 2g q 24 hr Stop date added for 9/24  Height: '5\' 1"'$  (154.9 cm) Weight: 83 kg (182 lb 15.7 oz) IBW/kg (Calculated) : 47.8  Temp (24hrs), Avg:98.3 F (36.8 C), Min:98 F (36.7 C), Max:98.8 F (37.1 C)  Recent Labs  Lab 12/14/21 1400 12/14/21 1509 12/14/21 2212 12/14/21 2256 12/15/21 0126 12/15/21 0407 12/16/21 0612 12/17/21 1020 12/18/21 0339  WBC 10.1  --   --   --   --  15.3* 17.5*  --   --   CREATININE 2.05*   < >  --  1.77* 1.80*  --  1.87* 1.88* 1.74*  LATICACIDVEN  --   --  1.2  --   --   --   --   --   --    < > = values in this interval not displayed.     Estimated Creatinine Clearance: 27.3 mL/min (A) (by C-G formula based on SCr of 1.74 mg/dL (H)).    Allergies  Allergen Reactions   Ozempic (0.25 Or 0.5 Mg-Dose) [Semaglutide(0.25 Or 0.'5mg'$ -Dos)] Nausea And Vomiting   Amoxil [Amoxicillin] Other (See Comments)    Cellulitis    Glucophage [Metformin] Diarrhea   Invokana [Canagliflozin] Other (See Comments)    UTI   Lipitor [Atorvastatin] Other (See Comments)    Myalgias   Lyrica Cr [Pregabalin Er] Other (See Comments)    Dizziness    Metformin And Related    Other Nausea And Vomiting    Pt states does not know drug but anesthesia drugs have made her have nausea and vomiting.    Sulfa Antibiotics Diarrhea   Trulicity [Dulaglutide] Other (See Comments)    GI Upset   Potassium-Containing Compounds Rash    Antimicrobials this admission: Vanc 9/17  >> 9/17 Cefe 9/17 >> 9/24  Dose adjustments this admission: N/a  Microbiology results: 9/16 BCx: ngtd 9/17 Sputum: pan-sensitive Pseudomonas aeruginosa 9/15 MRSA PCR: neg  Thank you for allowing pharmacy to be a part of this patient's care.  Dimple Nanas, PharmD, BCPS 12/19/2021  10:09 AM

## 2021-12-19 NOTE — Progress Notes (Signed)
[facility] PROGRESS NOTE    Progress Note  Elizabeth Crosby  ZJI:[id number] DOB: 01-25-1946 DOA: 12/14/2021 PCP: [name], MD     Brief Narrative:   Elizabeth Crosby is an 76 y.o. female past medical history of chronic kidney disease stage IV, essential hypertension, insulin-dependent diabetes mellitus poor dentition, recently discharged from the hospital on 10/09/2021 for staph lugdesis bacteremia brought into any pain for acute encephalopathy and not able to follow commands, in route to the hospital she was noted to have seizure-like activity was intubated for airway protection code stroke was called CTA of the head was negative he was felt to be postictal from seizures and loaded with Keppra and transferred to [facility] for long-term EEG, MRI of the brain showed no acute intracranial abnormalities and T2 hyperintensity    Significant Events: 9/15 admitted with working diagnosis of seizure.  Seized in route to the emergency room and was unable to protect airway on arrival.  Intubated.  Code stroke called.  CTA negative for stroke.   Loaded with IV Keppra, transferred to [facility] for MRI of brain and LTM-EEG monitoring 9/17: MRI completed with areas of chronic siderosis felt possibly secondary to remote hypoxic ischemic event.   9/17 Echocardiogram with EF 70 to 75% no regional wall abnormality grade 1 diastolic dysfunction RV systolic function normal left atrial size mildly dilated agitated saline contrast bubble negative 9/18:  Extubated.  Very awake postextubation.  Concern for need for reintubation, had goals of care discussion.  Patient DO NOT RESUSCITATE, no reintubation. 9/19 remaining weak, transfer to progressive   Antibiotics: None  Microbiology data: 12/14/2021 blood culture: Negative  Procedures: None  Assessment/Plan:   New onset Seizures ([place]) No clear etiology, MRI showed no acute findings.  But today showed remote history of what looks like prior  hypoxic insult. Neurology was consulted recommended to load up with Keppra. He is currently on Keppra twice a day. For therapy evaluated the patient will need skilled nursing facility.  Acute metabolic encephalopathy: Now improved.  Patient is lethargic but awake. Cannot carry on a conversation. It is to note she is on cefepime and Keppra.  Continue to evaluate closely.  Acute respiratory failure with hypoxia due to inability to protect airway requiring intubation: Now extubated. She is currently a DNR not a candidate for BiPAP she is also DNI. Swallowing evaluation recommended the patient to be n.p.o., will try medications on a alternative route. She may need nasal cannula. Pulmonary recommended to complete a 10-day-day course of cefepime.  Pseudomonas pneumonia: Required intubation is now being weaned off. Pulmonary performed sputum culture that grew Pseudomonas. They recommended a 10-day course of antibiotics Severe protein caloric malnutrition/deconditioning:  Chronic kidney disease stage IV: She still positive about 2.5 L, she was started on IV Lasix and had good diuresis over the last 24 hours. Metabolic panels pending  Diabetes mellitus type 2 insulin-dependent: Fairly controlled on sliding scale insulin. We will add low-dose long-acting.  Nutrition: Patient failed swallowing evaluation. Core track placed 12/18/2021. Patient has been consulted and she has been started on enteral feedings.  Leukocytosis Possibly due to seizures versus pneumonia. Tmax 98.8. Repeat a CBC.  GERD (gastroesophageal reflux disease) Continue PPI.  Normocytic anemia: Follow-up with PCP as an outpatient.   DVT prophylaxis: lovenox Family Communication:none Status is: Inpatient Remains inpatient appropriate because: Acute seizure disorder.    Code Status:     Code Status Orders  (From admission, onward)  Start     Ordered   12/17/21 1759  Do not attempt  resuscitation (DNR)  Continuous       Question Answer Comment  In the event of cardiac or respiratory ARREST Do not call a "code blue"   In the event of cardiac or respiratory ARREST Do not perform Intubation, CPR, defibrillation or ACLS   In the event of cardiac or respiratory ARREST Use medication by any route, position, wound care, and other measures to relive pain and suffering. May use oxygen, suction and manual treatment of airway obstruction as needed for comfort.      12/17/21 1758           Code Status History     Date Active Date Inactive Code Status Order ID Comments User Context   10/03/2021 0811 10/09/2021 2026 Full Code [id number]  Donita Brooks, NP ED   11/18/2020 2217 11/26/2020 0405 Full Code [id number]  Etta Quill, DO ED   09/30/2017 1615 10/01/2017 1720 Full Code [id number]  [name], MD Inpatient         IV Access:   Peripheral IV   Procedures and diagnostic studies:   DG Abd Portable 1V  Result Date: 12/18/2021 CLINICAL DATA:  Feeding tube placement EXAM: PORTABLE ABDOMEN - 1 VIEW COMPARISON:  12/14/2021 FINDINGS: Feeding tube tip in the right upper quadrant, likely in the distal stomach or proximal duodenum. Nonobstructive bowel gas pattern. Prior cholecystectomy. IMPRESSION: Feeding tube tip in the distal stomach or proximal duodenum. Electronically Signed   By: Rolm Baptise M.D.   On: 12/18/2021 10:47     Medical Consultants:   None.   Subjective:    Elizabeth Crosby moaning and groaning  Objective:    Vitals:   12/18/21 2019 12/19/21 0021 12/19/21 0419 12/19/21 0742  BP: (!) 148/47 (!) 149/41  (!) 144/40  Pulse:    69  Resp: '13 13  12  '$ Temp: 98 F (36.7 C) 98.1 F (36.7 C) 98.5 F (36.9 C) 98.2 F (36.8 C)  TempSrc: Oral Oral Oral Oral  SpO2: 99% 99% 98% 92%  Weight:      Height:       SpO2: 92 % O2 Flow Rate (L/min): 4 L/min FiO2 (%): 30 %   Intake/Output Summary (Last 24 hours) at 12/19/2021 0812 Last data filed at  12/18/2021 1653 Gross per 24 hour  Intake 200.39 ml  Output 1000 ml  Net -799.61 ml   Filed Weights   12/14/21 1804 12/18/21 0340  Weight: 85.7 kg 83 kg    Exam: General exam: In no acute distress. Respiratory system: Good air movement and clear to auscultation. Cardiovascular system: S1 & S2 heard, RRR. No JVD. Gastrointestinal system: Abdomen is nondistended, soft and nontender.  Central nervous system: Alert and oriented x2 Extremities: No pedal edema. Skin: No rashes, lesions or ulcers Psychiatry: No judgment or insight of medical condition.   Data Reviewed:    Labs: Basic Metabolic Panel: Recent Labs  Lab 12/14/21 1544 12/14/21 2256 12/15/21 0021 12/15/21 0126 12/15/21 0326 12/15/21 1356 12/16/21 0612 12/17/21 1020 12/18/21 0339  NA  --  141   < > 141 142  --  136 136 138  K  --  3.5   < > 3.6 3.5  --  3.8 4.2 4.2  CL  --  112*  --  111  --   --  106 110 112*  CO2  --  20*  --  20*  --   --  18* 21* 21*  GLUCOSE  --  36*  --  114*  --   --  196* 162* 100*  BUN  --  72*  --  71*  --   --  69* 62* 59*  CREATININE  --  1.77*  --  1.80*  --   --  1.87* 1.88* 1.74*  CALCIUM  --  8.4*  --  8.6*  --   --  8.4* 8.0* 8.2*  MG 2.0 1.9  --  2.0  --  1.9 1.9  --   --   PHOS  --   --   --   --   --  3.4 3.8  --   --    < > = values in this interval not displayed.   GFR Estimated Creatinine Clearance: 27.3 mL/min (A) (by C-G formula based on SCr of 1.74 mg/dL (H)). Liver Function Tests: Recent Labs  Lab 12/14/21 1400  AST 40  ALT 29  ALKPHOS 121  BILITOT 1.0  PROT 6.9  ALBUMIN 3.1*   No results for input(s): "LIPASE", "AMYLASE" in the last 168 hours. No results for input(s): "AMMONIA" in the last 168 hours. Coagulation profile Recent Labs  Lab 12/14/21 1400  INR 1.1   COVID-19 Labs  No results for input(s): "DDIMER", "FERRITIN", "LDH", "CRP" in the last 72 hours.  Lab Results  Component Value Date   SARSCOV2NAA NEGATIVE 12/14/2021   [place]  NEGATIVE 11/25/2020   [place] NEGATIVE 11/18/2020    CBC: Recent Labs  Lab 12/14/21 1400 12/14/21 1509 12/15/21 0021 12/15/21 0326 12/15/21 0407 12/16/21 0612  WBC 10.1  --   --   --  15.3* 17.5*  NEUTROABS 6.6  --   --   --  9.7*  --   HGB 11.1* 9.9* 9.5* 8.2* 9.9* 9.6*  HCT 31.3* 29.0* 28.0* 24.0* 27.6* 27.1*  MCV 91.8  --   --   --  90.5 92.5  PLT 226  --   --   --  223 202   Cardiac Enzymes: Recent Labs  Lab 12/15/21 0126  CKTOTAL 76   BNP (last 3 results) No results for input(s): "PROBNP" in the last 8760 hours. CBG: Recent Labs  Lab 12/18/21 1202 12/18/21 1604 12/18/21 2124 12/19/21 0058 12/19/21 0429  GLUCAP 94 133* 169* 138* 154*   D-Dimer: No results for input(s): "DDIMER" in the last 72 hours. Hgb A1c: No results for input(s): "HGBA1C" in the last 72 hours. Lipid Profile: No results for input(s): "CHOL", "HDL", "LDLCALC", "TRIG", "CHOLHDL", "LDLDIRECT" in the last 72 hours. Thyroid function studies: No results for input(s): "TSH", "T4TOTAL", "T3FREE", "THYROIDAB" in the last 72 hours.  Invalid input(s): "FREET3" Anemia work up: No results for input(s): "VITAMINB12", "FOLATE", "FERRITIN", "TIBC", "IRON", "RETICCTPCT" in the last 72 hours. Sepsis Labs: Recent Labs  Lab 12/14/21 1400 12/14/21 2212 12/15/21 0407 12/16/21 0612 12/17/21 1020 12/18/21 0339  PROCALCITON  --   --   --   --  2.31 1.62  WBC 10.1  --  15.3* 17.5*  --   --   LATICACIDVEN  --  1.2  --   --   --   --    Microbiology Recent Results (from the past 240 hour(s))  Resp Panel by RT-PCR (Flu A&B, Covid) Anterior Nasal Swab     Status: None   Collection Time: 12/14/21  2:08 PM   Specimen: Anterior Nasal Swab  Result Value Ref Range Status   SARS Coronavirus 2 by RT  PCR NEGATIVE NEGATIVE Final    Comment: (NOTE) SARS-CoV-2 target nucleic acids are NOT DETECTED.  The SARS-CoV-2 RNA is generally detectable in upper respiratory specimens during the acute phase of  infection. The lowest concentration of SARS-CoV-2 viral copies this assay can detect is 138 copies/mL. A negative result does not preclude SARS-Cov-2 infection and should not be used as the sole basis for treatment or other patient management decisions. A negative result may occur with  improper specimen collection/handling, submission of specimen other than nasopharyngeal swab, presence of viral mutation(s) within the areas targeted by this assay, and inadequate number of viral copies(<138 copies/mL). A negative result must be combined with clinical observations, patient history, and epidemiological information. The expected result is Negative.  Fact Sheet for Patients:  EntrepreneurPulse.com.au  Fact Sheet for Healthcare Providers:  IncredibleEmployment.be  This test is no t yet approved or cleared by the [place] FDA and  has been authorized for detection and/or diagnosis of SARS-CoV-2 by FDA under an Emergency Use Authorization (EUA). This EUA will remain  in effect (meaning this test can be used) for the duration of the COVID-19 declaration under Section 564(b)(1) of the Act, 21 U.S.C.section 360bbb-3(b)(1), unless the authorization is terminated  or revoked sooner.       Influenza A by PCR NEGATIVE NEGATIVE Final   Influenza B by PCR NEGATIVE NEGATIVE Final    Comment: (NOTE) The Xpert Xpress SARS-CoV-2/FLU/RSV plus assay is intended as an aid in the diagnosis of influenza from Nasopharyngeal swab specimens and should not be used as a sole basis for treatment. Nasal washings and aspirates are unacceptable for Xpert Xpress SARS-CoV-2/FLU/RSV testing.  Fact Sheet for Patients: EntrepreneurPulse.com.au  Fact Sheet for Healthcare Providers: IncredibleEmployment.be  This test is not yet approved or cleared by the [place] FDA and has been authorized for detection and/or diagnosis of SARS-CoV-2  by FDA under an Emergency Use Authorization (EUA). This EUA will remain in effect (meaning this test can be used) for the duration of the COVID-19 declaration under Section 564(b)(1) of the Act, 21 U.S.C. section 360bbb-3(b)(1), unless the authorization is terminated or revoked.  Performed at [facility], [address], [place], [place] [address]   MRSA Next Gen by PCR, Nasal     Status: None   Collection Time: 12/14/21  9:41 PM   Specimen: Nasal Mucosa; Nasal Swab  Result Value Ref Range Status   MRSA by PCR Next Gen NOT DETECTED NOT DETECTED Final    Comment: (NOTE) The GeneXpert MRSA Assay (FDA approved for NASAL specimens only), is one component of a comprehensive MRSA colonization surveillance program. It is not intended to diagnose MRSA infection nor to guide or monitor treatment for MRSA infections. Test performance is not FDA approved in patients less than 47 years old. Performed at [facility], [place] [address] [place], [place] [address]   Culture, blood (Routine X 2) w Reflex to ID Panel     Status: None (Preliminary result)   Collection Time: 12/15/21  1:26 AM   Specimen: BLOOD RIGHT HAND  Result Value Ref Range Status   Specimen Description BLOOD RIGHT HAND  Final   Special Requests   Final    BOTTLES DRAWN AEROBIC AND ANAEROBIC Blood Culture adequate volume   Culture   Final    NO GROWTH 3 DAYS Performed at [facility], [place] [address] [place],  [address]    Report Status PENDING  Incomplete  Culture, blood (Routine X 2) w  Reflex to ID Panel     Status: None (Preliminary result)   Collection Time: 12/15/21  1:40 AM   Specimen: BLOOD LEFT HAND  Result Value Ref Range Status   Specimen Description BLOOD LEFT HAND  Final   Special Requests   Final    BOTTLES DRAWN AEROBIC AND ANAEROBIC Blood Culture results may not be optimal due to an inadequate volume of blood received in culture bottles   Culture   Final    NO GROWTH 3 DAYS Performed at  [facility], [place] [address] [place], [place] [address]    Report Status PENDING  Incomplete  Culture, Respiratory w Gram Stain     Status: None (Preliminary result)   Collection Time: 12/16/21  8:46 AM   Specimen: Tracheal Aspirate; Respiratory  Result Value Ref Range Status   Specimen Description TRACHEAL ASPIRATE  Final   Special Requests NONE  Final   Gram Stain   Final    ABUNDANT WBC PRESENT,BOTH PMN AND MONONUCLEAR MODERATE GRAM NEGATIVE RODS Performed at [facility], [place] [address] [place], [place] [address]    Culture FEW PSEUDOMONAS AERUGINOSA  Final   Report Status PENDING  Incomplete   Organism ID, Bacteria PSEUDOMONAS AERUGINOSA  Final      Susceptibility   Pseudomonas aeruginosa - MIC*    CEFTAZIDIME 4 SENSITIVE Sensitive     CIPROFLOXACIN <=0.25 SENSITIVE Sensitive     GENTAMICIN <=1 SENSITIVE Sensitive     IMIPENEM 1 SENSITIVE Sensitive     PIP/TAZO <=4 SENSITIVE Sensitive     CEFEPIME 2 SENSITIVE Sensitive     * FEW PSEUDOMONAS AERUGINOSA     Medications:    docusate  100 mg Per Tube BID   feeding supplement (PROSource TF20)  60 mL Per Tube Daily   heparin injection (subcutaneous)  5,000 Units Subcutaneous Q8H   insulin aspart  0-15 Units Subcutaneous Q4H   midodrine  10 mg Oral Q8H   pantoprazole  40 mg Per Tube Daily   polyethylene glycol  17 g Per Tube Daily   sodium chloride flush  10-40 mL Intracatheter Q12H   Continuous Infusions:  sodium chloride 250 mL (12/18/21 1817)   ceFEPime (MAXIPIME) IV Stopped (12/18/21 1304)   feeding supplement (JEVITY 1.2 CAL) 1,000 mL (12/18/21 1820)   levETIRAcetam 500 mg (12/18/21 2207)      LOS: 5 days   Charlynne Cousins  [facility]  12/19/2021, 8:12 AM

## 2021-12-19 NOTE — Care Management Important Message (Signed)
Important Message  Patient Details  Name: Elizabeth Crosby MRN: [id number] Date of Birth: 08/22/1945   Medicare Important Message Given:  No     Hannah Beat 12/19/2021, 1:29 PM

## 2021-12-19 NOTE — Progress Notes (Signed)
SLP Cancellation Note  Patient Details Name: MONIGUE SPRAGGINS MRN: [id number] DOB: Mar 08, 1946   Cancelled treatment:       Reason Eval/Treat Not Completed: Other (comment);Patient's level of consciousness;Fatigue/lethargy limiting ability to participate (pt did not awaken adequately for po trials, has Cortrak, will continue efforts)  Kathleen Lime, MS [facility] SLP Acute Rehab Services Office [phone] Pager [phone]  Macario Golds 12/19/2021, 10:25 AM

## 2021-12-20 DIAGNOSIS — E1169 Type 2 diabetes mellitus with other specified complication: Secondary | ICD-10-CM | POA: Diagnosis not present

## 2021-12-20 DIAGNOSIS — R569 Unspecified convulsions: Secondary | ICD-10-CM | POA: Diagnosis not present

## 2021-12-20 DIAGNOSIS — J9600 Acute respiratory failure, unspecified whether with hypoxia or hypercapnia: Secondary | ICD-10-CM | POA: Diagnosis not present

## 2021-12-20 DIAGNOSIS — G9341 Metabolic encephalopathy: Secondary | ICD-10-CM | POA: Diagnosis not present

## 2021-12-20 LAB — GLUCOSE, CAPILLARY
Glucose-Capillary: 175 mg/dL — ABNORMAL HIGH (ref 70–99)
Glucose-Capillary: 146 mg/dL — ABNORMAL HIGH (ref 70–99)
Glucose-Capillary: 139 mg/dL — ABNORMAL HIGH (ref 70–99)
Glucose-Capillary: 174 mg/dL — ABNORMAL HIGH (ref 70–99)
Glucose-Capillary: 153 mg/dL — ABNORMAL HIGH (ref 70–99)
Glucose-Capillary: 189 mg/dL — ABNORMAL HIGH (ref 70–99)
Glucose-Capillary: 183 mg/dL — ABNORMAL HIGH (ref 70–99)

## 2021-12-20 LAB — CBC
HCT: 24.2 % — ABNORMAL LOW (ref 36.0–46.0)
Hemoglobin: 8.1 g/dL — ABNORMAL LOW (ref 12.0–15.0)
MCH: 31.9 pg (ref 26.0–34.0)
MCHC: 33.5 g/dL (ref 30.0–36.0)
MCV: 95.3 fL (ref 80.0–100.0)
Platelets: 171 10*3/uL (ref 150–400)
RBC: 2.54 MIL/uL — ABNORMAL LOW (ref 3.87–5.11)
RDW: 14.1 % (ref 11.5–15.5)
WBC: 8.8 10*3/uL (ref 4.0–10.5)
nRBC: 0 % (ref 0.0–0.2)

## 2021-12-20 LAB — BASIC METABOLIC PANEL
Anion gap: 6 (ref 5–15)
BUN: 63 mg/dL — ABNORMAL HIGH (ref 8–23)
CO2: 23 mmol/L (ref 22–32)
Calcium: 8.8 mg/dL — ABNORMAL LOW (ref 8.9–10.3)
Chloride: 115 mmol/L — ABNORMAL HIGH (ref 98–111)
Creatinine, Ser: 1.2 mg/dL — ABNORMAL HIGH (ref 0.44–1.00)
GFR, Estimated: 47 mL/min — ABNORMAL LOW (ref >60)
Glucose, Bld: 140 mg/dL — ABNORMAL HIGH (ref 70–99)
Potassium: 4.5 mmol/L (ref 3.5–5.1)
Sodium: 144 mmol/L (ref 135–145)

## 2021-12-20 LAB — CULTURE, BLOOD (ROUTINE X 2)
Culture: NO GROWTH
Culture: NO GROWTH
Special Requests: ADEQUATE

## 2021-12-20 MED ORDER — MIDODRINE HCL 5 MG PO TABS: 2.5000 mg | ORAL_TABLET | Freq: Two times a day (BID) | ORAL | Status: DC

## 2021-12-20 MED ORDER — FREE WATER
200.0000 mL | Freq: Two times a day (BID) | Status: DC
  Administered 2021-12-20 – 2021-12-21 (×3): 200 mL

## 2021-12-20 NOTE — Progress Notes (Addendum)
[facility] PROGRESS NOTE    Progress Note  Elizabeth Crosby  ZOX:[id number] DOB: 1945-06-12 DOA: 12/14/2021 PCP: [name], MD     Brief Narrative:   Elizabeth Crosby is an 76 y.o. female past medical history of chronic kidney disease stage IV, essential hypertension, insulin-dependent diabetes mellitus poor dentition, recently discharged from the hospital on 10/09/2021 for staph lugdesis bacteremia brought into any pain for acute encephalopathy and not able to follow commands, in route to the hospital she was noted to have seizure-like activity was intubated for airway protection code stroke was called CTA of the head was negative he was felt to be postictal from seizures and loaded with Keppra and transferred to [facility] for long-term EEG, MRI of the brain showed no acute intracranial abnormalities and T2 hyperintensity    Significant Events: 9/15 admitted with working diagnosis of seizure.  Seized in route to the emergency room and was unable to protect airway on arrival.  Intubated.  Code stroke called.  CTA negative for stroke.   Loaded with IV Keppra, transferred to [facility] for MRI of brain and LTM-EEG monitoring 9/17: MRI completed with areas of chronic siderosis felt possibly secondary to remote hypoxic ischemic event.   9/17 Echocardiogram with EF 70 to 75% no regional wall abnormality grade 1 diastolic dysfunction RV systolic function normal left atrial size mildly dilated agitated saline contrast bubble negative 9/18:  Extubated.  Very awake postextubation.  Concern for need for reintubation, had goals of care discussion.  Patient DO NOT RESUSCITATE, no reintubation. 9/19 remaining weak, transfer to progressive   Antibiotics: None  Microbiology data: 12/14/2021 blood culture: Negative  Procedures: None  Assessment/Plan:   New onset Seizures ([place]) No clear etiology, MRI showed no acute findings,  But showed remote history of what looks like prior hypoxic  insult. Neurology was consulted recommended to load up with Keppra. Keppra twice a day. Physical therapy evaluated the patient will need skilled nursing facility. Neurology recommended follow-up with them as an outpatient.  Acute metabolic encephalopathy: Still lethargic not able to follow-up conversation but able to move extremities on command. It is to note she is on cefepime and Keppra.  Continue to evaluate closely. Out of bed to chair.  Continue to work with physical therapy.  Acute respiratory failure with hypoxia due to inability to protect airway requiring intubation: Now extubated. She is currently a DNR not a candidate for BiPAP she is also DNI. Swallowing evaluation recommended the patient to be n.p.o.,. We will need to repeat swallowing evaluation next week, if failed might be a candidate for PEG tube. Continue medications through NG tube. He has been weaned to room air. Completed 10-day course of antibiotics.  Pseudomonas pneumonia: Required intubation is now being weaned off. Pulmonary performed sputum culture that grew Pseudomonas. Continue antibiotic coverage for 10 days. Tmax of 99.4, leukocytosis resolved.  Nutrition: With NG tube on tube feedings. Speech evaluated the patient recommended the patient to be NPO. We will need to reevaluate next week, she may be a candidate for percutaneous G-tube placement if no improvement in her mentation and swallowing. We will start her on free water per tube every 12 hours.  Check a BMP tomorrow morning  Acute kidney injury on chronic kidney disease stage IV: Likely prerenal with a baseline creatinine of 1.2-1.4. She still positive about 2.0, this morning creatinine has returned to baseline. We will start on free water per tube monitor strict I's and O's, recheck a basic metabolic panel  tomorrow morning.  Essential hypertension/hypotension: Pressure was low on admission antihypertensive medications were held she was started  on midodrine in the ICU. Her blood pressure is high and we will hold midodrine for today and discontinue midodrine.  Diabetes mellitus type 2 insulin-dependent: Improve control, continue long-acting insulin plus sliding scale.  Nutrition: Patient failed swallowing evaluation. Core track placed 12/18/2021. Nutrition has been consulted and she has been started on enteral feedings.  Leukocytosis Possibly due to seizures versus pneumonia. Tmax 99.1.  GERD (gastroesophageal reflux disease) Continue PPI.  Normocytic anemia: Follow-up with PCP as an outpatient.   DVT prophylaxis: lovenox Family Communication:none Status is: Inpatient Remains inpatient appropriate because: Acute seizure disorder.    Code Status:     Code Status Orders  (From admission, onward)           Start     Ordered   12/17/21 1759  Do not attempt resuscitation (DNR)  Continuous       Question Answer Comment  In the event of cardiac or respiratory ARREST Do not call a "code blue"   In the event of cardiac or respiratory ARREST Do not perform Intubation, CPR, defibrillation or ACLS   In the event of cardiac or respiratory ARREST Use medication by any route, position, wound care, and other measures to relive pain and suffering. May use oxygen, suction and manual treatment of airway obstruction as needed for comfort.      12/17/21 1758           Code Status History     Date Active Date Inactive Code Status Order ID Comments User Context   10/03/2021 0811 10/09/2021 2026 Full Code [id number]  Donita Brooks, NP ED   11/18/2020 2217 11/26/2020 0405 Full Code [id number]  Etta Quill, DO ED   09/30/2017 1615 10/01/2017 1720 Full Code [id number]  [name], MD Inpatient         IV Access:   Peripheral IV   Procedures and diagnostic studies:   [place] EKG SITE RITE  Result Date: 12/19/2021 If Site Rite image not attached, placement could not be confirmed due to current cardiac rhythm.  DG  Abd Portable 1V  Result Date: 12/18/2021 CLINICAL DATA:  Feeding tube placement EXAM: PORTABLE ABDOMEN - 1 VIEW COMPARISON:  12/14/2021 FINDINGS: Feeding tube tip in the right upper quadrant, likely in the distal stomach or proximal duodenum. Nonobstructive bowel gas pattern. Prior cholecystectomy. IMPRESSION: Feeding tube tip in the distal stomach or proximal duodenum. Electronically Signed   By: Rolm Baptise M.D.   On: 12/18/2021 10:47     Medical Consultants:   None.   Subjective:    Elizabeth Crosby more awake today but still sleepy  Objective:    Vitals:   12/19/21 2329 12/20/21 0400 12/20/21 0408 12/20/21 0739  BP: (!) 181/54 (!) 158/49  (!) 167/56  Pulse: 73 71  80  Resp: '16 18  18  '$ Temp: 97.9 F (36.6 C) 99.4 F (37.4 C)  99.1 F (37.3 C)  TempSrc: Oral Oral  Oral  SpO2: 98% 99%  99%  Weight:   87.7 kg   Height:       SpO2: 99 % O2 Flow Rate (L/min): 4 L/min FiO2 (%): 30 %   Intake/Output Summary (Last 24 hours) at 12/20/2021 0835 Last data filed at 12/20/2021 0739 Gross per 24 hour  Intake --  Output 800 ml  Net -800 ml    Filed Weights   12/14/21 1804 12/18/21 0340 12/20/21  0408  Weight: 85.7 kg 83 kg 87.7 kg    Exam: General exam: In no acute distress, sleepy Respiratory system: Good air movement and clear to auscultation. Cardiovascular system: S1 & S2 heard, RRR. No JVD. Gastrointestinal system: Abdomen is nondistended, soft and nontender.  Central nervous system: Sleepy, alert to person able to move all 4 extremities without any difficulties. Extremities: 2+ edema, especially significant in the upper extremities. Skin: No rashes, lesions or ulcers  Data Reviewed:    Labs: Basic Metabolic Panel: Recent Labs  Lab 12/14/21 1544 12/14/21 2256 12/15/21 0021 12/15/21 0126 12/15/21 0326 12/15/21 1356 12/16/21 0612 12/17/21 1020 12/18/21 0339 12/19/21 2110 12/20/21 0420  NA  --  141   < > 141   < >  --  136 136 138 142 144  K  --  3.5    < > 3.6   < >  --  3.8 4.2 4.2 4.5 4.5  CL  --  112*  --  111  --   --  106 110 112* 113* 115*  CO2  --  20*  --  20*  --   --  18* 21* 21* 22 23  GLUCOSE  --  36*  --  114*  --   --  196* 162* 100* 111* 140*  BUN  --  72*  --  71*  --   --  69* 62* 59* 64* 63*  CREATININE  --  1.77*  --  1.80*  --   --  1.87* 1.88* 1.74* 1.30* 1.20*  CALCIUM  --  8.4*  --  8.6*  --   --  8.4* 8.0* 8.2* 8.7* 8.8*  MG 2.0 1.9  --  2.0  --  1.9 1.9  --   --   --   --   PHOS  --   --   --   --   --  3.4 3.8  --   --   --   --    < > = values in this interval not displayed.    GFR Estimated Creatinine Clearance: 40.8 mL/min (A) (by C-G formula based on SCr of 1.2 mg/dL (H)). Liver Function Tests: Recent Labs  Lab 12/14/21 1400  AST 40  ALT 29  ALKPHOS 121  BILITOT 1.0  PROT 6.9  ALBUMIN 3.1*    No results for input(s): "LIPASE", "AMYLASE" in the last 168 hours. No results for input(s): "AMMONIA" in the last 168 hours. Coagulation profile Recent Labs  Lab 12/14/21 1400  INR 1.1    COVID-19 Labs  No results for input(s): "DDIMER", "FERRITIN", "LDH", "CRP" in the last 72 hours.  Lab Results  Component Value Date   SARSCOV2NAA NEGATIVE 12/14/2021   [place] NEGATIVE 11/25/2020   [place] NEGATIVE 11/18/2020    CBC: Recent Labs  Lab 12/14/21 1400 12/14/21 1509 12/15/21 0326 12/15/21 0407 12/16/21 0612 12/19/21 2110 12/20/21 0420  WBC 10.1  --   --  15.3* 17.5* 10.9* 8.8  NEUTROABS 6.6  --   --  9.7*  --  6.5  --   HGB 11.1*   < > 8.2* 9.9* 9.6* 8.8* 8.1*  HCT 31.3*   < > 24.0* 27.6* 27.1* 25.3* 24.2*  MCV 91.8  --   --  90.5 92.5 93.7 95.3  PLT 226  --   --  223 202 192 171   < > = values in this interval not displayed.    Cardiac Enzymes: Recent Labs  Lab 12/15/21  0126  CKTOTAL 76    BNP (last 3 results) No results for input(s): "PROBNP" in the last 8760 hours. CBG: Recent Labs  Lab 12/19/21 1649 12/19/21 1952 12/20/21 0110 12/20/21 0352 12/20/21 0736   GLUCAP 129* 150* 175* 146* 139*    D-Dimer: No results for input(s): "DDIMER" in the last 72 hours. Hgb A1c: No results for input(s): "HGBA1C" in the last 72 hours. Lipid Profile: No results for input(s): "CHOL", "HDL", "LDLCALC", "TRIG", "CHOLHDL", "LDLDIRECT" in the last 72 hours. Thyroid function studies: No results for input(s): "TSH", "T4TOTAL", "T3FREE", "THYROIDAB" in the last 72 hours.  Invalid input(s): "FREET3" Anemia work up: No results for input(s): "VITAMINB12", "FOLATE", "FERRITIN", "TIBC", "IRON", "RETICCTPCT" in the last 72 hours. Sepsis Labs: Recent Labs  Lab 12/14/21 2212 12/15/21 0407 12/16/21 0612 12/17/21 1020 12/18/21 0339 12/19/21 2110 12/20/21 0420  PROCALCITON  --   --   --  2.31 1.62 1.77  --   WBC  --  15.3* 17.5*  --   --  10.9* 8.8  LATICACIDVEN 1.2  --   --   --   --   --   --     Microbiology Recent Results (from the past 240 hour(s))  Resp Panel by RT-PCR (Flu A&B, Covid) Anterior Nasal Swab     Status: None   Collection Time: 12/14/21  2:08 PM   Specimen: Anterior Nasal Swab  Result Value Ref Range Status   SARS Coronavirus 2 by RT PCR NEGATIVE NEGATIVE Final    Comment: (NOTE) SARS-CoV-2 target nucleic acids are NOT DETECTED.  The SARS-CoV-2 RNA is generally detectable in upper respiratory specimens during the acute phase of infection. The lowest concentration of SARS-CoV-2 viral copies this assay can detect is 138 copies/mL. A negative result does not preclude SARS-Cov-2 infection and should not be used as the sole basis for treatment or other patient management decisions. A negative result may occur with  improper specimen collection/handling, submission of specimen other than nasopharyngeal swab, presence of viral mutation(s) within the areas targeted by this assay, and inadequate number of viral copies(<138 copies/mL). A negative result must be combined with clinical observations, patient history, and  epidemiological information. The expected result is Negative.  Fact Sheet for Patients:  EntrepreneurPulse.com.au  Fact Sheet for Healthcare Providers:  IncredibleEmployment.be  This test is no t yet approved or cleared by the [place] FDA and  has been authorized for detection and/or diagnosis of SARS-CoV-2 by FDA under an Emergency Use Authorization (EUA). This EUA will remain  in effect (meaning this test can be used) for the duration of the COVID-19 declaration under Section 564(b)(1) of the Act, 21 U.S.C.section 360bbb-3(b)(1), unless the authorization is terminated  or revoked sooner.       Influenza A by PCR NEGATIVE NEGATIVE Final   Influenza B by PCR NEGATIVE NEGATIVE Final    Comment: (NOTE) The Xpert Xpress SARS-CoV-2/FLU/RSV plus assay is intended as an aid in the diagnosis of influenza from Nasopharyngeal swab specimens and should not be used as a sole basis for treatment. Nasal washings and aspirates are unacceptable for Xpert Xpress SARS-CoV-2/FLU/RSV testing.  Fact Sheet for Patients: EntrepreneurPulse.com.au  Fact Sheet for Healthcare Providers: IncredibleEmployment.be  This test is not yet approved or cleared by the [place] FDA and has been authorized for detection and/or diagnosis of SARS-CoV-2 by FDA under an Emergency Use Authorization (EUA). This EUA will remain in effect (meaning this test can be used) for the duration of the COVID-19 declaration under  Section 564(b)(1) of the Act, 21 U.S.C. section 360bbb-3(b)(1), unless the authorization is terminated or revoked.  Performed at [facility], [address] [place], [place] [address]   MRSA Next Gen by PCR, Nasal     Status: None   Collection Time: 12/14/21  9:41 PM   Specimen: Nasal Mucosa; Nasal Swab  Result Value Ref Range Status   MRSA by PCR Next Gen NOT DETECTED NOT DETECTED Final    Comment: (NOTE) The  GeneXpert MRSA Assay (FDA approved for NASAL specimens only), is one component of a comprehensive MRSA colonization surveillance program. It is not intended to diagnose MRSA infection nor to guide or monitor treatment for MRSA infections. Test performance is not FDA approved in patients less than 34 years old. Performed at [facility], [place] [address], [place], [place] [address]   Culture, blood (Routine X 2) w Reflex to ID Panel     Status: None (Preliminary result)   Collection Time: 12/15/21  1:26 AM   Specimen: BLOOD RIGHT HAND  Result Value Ref Range Status   Specimen Description BLOOD RIGHT HAND  Final   Special Requests   Final    BOTTLES DRAWN AEROBIC AND ANAEROBIC Blood Culture adequate volume   Culture   Final    NO GROWTH 4 DAYS Performed at [facility], [place] [address] [place], [place] [address]    Report Status PENDING  Incomplete  Culture, blood (Routine X 2) w Reflex to ID Panel     Status: None (Preliminary result)   Collection Time: 12/15/21  1:40 AM   Specimen: BLOOD LEFT HAND  Result Value Ref Range Status   Specimen Description BLOOD LEFT HAND  Final   Special Requests   Final    BOTTLES DRAWN AEROBIC AND ANAEROBIC Blood Culture results may not be optimal due to an inadequate volume of blood received in culture bottles   Culture   Final    NO GROWTH 4 DAYS Performed at [facility], [place] [address] [place], [place] [address]    Report Status PENDING  Incomplete  Culture, Respiratory w Gram Stain     Status: None   Collection Time: 12/16/21  8:46 AM   Specimen: Tracheal Aspirate; Respiratory  Result Value Ref Range Status   Specimen Description TRACHEAL ASPIRATE  Final   Special Requests NONE  Final   Gram Stain   Final    ABUNDANT WBC PRESENT,BOTH PMN AND MONONUCLEAR MODERATE GRAM NEGATIVE RODS Performed at [facility], [place] [address] [place], [place] [address]    Culture FEW PSEUDOMONAS AERUGINOSA  Final   Report Status  12/19/2021 FINAL  Final   Organism ID, Bacteria PSEUDOMONAS AERUGINOSA  Final      Susceptibility   Pseudomonas aeruginosa - MIC*    CEFTAZIDIME 4 SENSITIVE Sensitive     CIPROFLOXACIN <=0.25 SENSITIVE Sensitive     GENTAMICIN <=1 SENSITIVE Sensitive     IMIPENEM 1 SENSITIVE Sensitive     PIP/TAZO <=4 SENSITIVE Sensitive     CEFEPIME 2 SENSITIVE Sensitive     * FEW PSEUDOMONAS AERUGINOSA     Medications:    Chlorhexidine Gluconate Cloth  6 each Topical Daily   docusate  100 mg Per Tube BID   feeding supplement (PROSource TF20)  60 mL Per Tube Daily   heparin injection (subcutaneous)  5,000 Units Subcutaneous Q8H   insulin aspart  0-15 Units Subcutaneous Q4H   insulin detemir  5 Units Subcutaneous Daily   [  START ON 12/21/2021] midodrine  2.5 mg Per Tube BID WC   pantoprazole  40 mg Per Tube Daily   polyethylene glycol  17 g Per Tube Daily   sodium chloride flush  10-40 mL Intracatheter Q12H   Continuous Infusions:  sodium chloride 250 mL (12/18/21 1817)   ceFEPime (MAXIPIME) IV 2 g (12/19/21 1306)   feeding supplement (JEVITY 1.2 CAL) 1,000 mL (12/19/21 1720)   levETIRAcetam 500 mg (12/20/21 0820)      LOS: 6 days   Charlynne Cousins  [facility]  12/20/2021, 8:35 AM

## 2021-12-20 NOTE — Progress Notes (Signed)
SLP Cancellation Note  Patient Details Name: Elizabeth Crosby MRN: [id number] DOB: 1945/11/01   Cancelled treatment:       Reason Eval/Treat Not Completed: Fatigue/lethargy limiting ability to participate. Per RN, still very sleepy today and not alert enough for POs. Will f/u as able.     Osie Bond., M.A. [place] Office [phone]  Secure chat preferred  12/20/2021, 10:33 AM

## 2021-12-21 DIAGNOSIS — Z66 Do not resuscitate: Secondary | ICD-10-CM

## 2021-12-21 DIAGNOSIS — R569 Unspecified convulsions: Secondary | ICD-10-CM | POA: Diagnosis not present

## 2021-12-21 DIAGNOSIS — E1169 Type 2 diabetes mellitus with other specified complication: Secondary | ICD-10-CM | POA: Diagnosis not present

## 2021-12-21 DIAGNOSIS — G9341 Metabolic encephalopathy: Secondary | ICD-10-CM | POA: Diagnosis not present

## 2021-12-21 LAB — BASIC METABOLIC PANEL
Anion gap: 8 (ref 5–15)
BUN: 58 mg/dL — ABNORMAL HIGH (ref 8–23)
CO2: 23 mmol/L (ref 22–32)
Calcium: 9.1 mg/dL (ref 8.9–10.3)
Chloride: 116 mmol/L — ABNORMAL HIGH (ref 98–111)
Creatinine, Ser: 1.11 mg/dL — ABNORMAL HIGH (ref 0.44–1.00)
GFR, Estimated: 52 mL/min — ABNORMAL LOW (ref >60)
Glucose, Bld: 160 mg/dL — ABNORMAL HIGH (ref 70–99)
Potassium: 4.7 mmol/L (ref 3.5–5.1)
Sodium: 147 mmol/L — ABNORMAL HIGH (ref 135–145)

## 2021-12-21 LAB — GLUCOSE, CAPILLARY
Glucose-Capillary: 154 mg/dL — ABNORMAL HIGH (ref 70–99)
Glucose-Capillary: 156 mg/dL — ABNORMAL HIGH (ref 70–99)
Glucose-Capillary: 157 mg/dL — ABNORMAL HIGH (ref 70–99)
Glucose-Capillary: 162 mg/dL — ABNORMAL HIGH (ref 70–99)
Glucose-Capillary: 169 mg/dL — ABNORMAL HIGH (ref 70–99)
Glucose-Capillary: 175 mg/dL — ABNORMAL HIGH (ref 70–99)
Glucose-Capillary: 176 mg/dL — ABNORMAL HIGH (ref 70–99)

## 2021-12-21 MED ORDER — FREE WATER
200.0000 mL | Freq: Four times a day (QID) | Status: DC
  Administered 2021-12-21 – 2021-12-27 (×24): 200 mL

## 2021-12-21 NOTE — Progress Notes (Signed)
Physical Therapy Treatment Patient Details Name: Elizabeth Crosby MRN: [id number] DOB: 1946/03/26 Today's Date: 12/21/2021   History of Present Illness 76 yo female admitted from SNF with new onset seizure requiring intubation 9/15-9/18. MRI negative for acute findings.  PMH: CKD III, DM, GERD, anxiety, gout, HLD, HTN, and obesity.    PT Comments    Patient progressing with mobility and able to progress to OOB to chair.  She was initially stiff and throughout very slow to respond to commands/questions.  She was weak on bilateral UE's, but seemed worse on L.  Attempts to use Stedy not as productive so utilized squat pivot technique to get to recliner.  She was able to accept weight on both legs, though c/o pain and legs ended up crossed once in the chair.  Feel she remains appropriate for STSNF level rehab at d/c.     Recommendations for follow up therapy are one component of a multi-disciplinary discharge planning process, led by the attending physician.  Recommendations may be updated based on patient status, additional functional criteria and insurance authorization.  Follow Up Recommendations  Skilled nursing-short term rehab (<3 hours/day) Can patient physically be transported by private vehicle: No   Assistance Recommended at Discharge Frequent or constant Supervision/Assistance  Patient can return home with the following     Equipment Recommendations  None recommended by PT    Recommendations for Other Services       Precautions / Restrictions Precautions Precautions: Fall Precaution Comments: coretrak, seizure Restrictions Weight Bearing Restrictions: No     Mobility  Bed Mobility Overal bed mobility: Needs Assistance Bed Mobility: Supine to Sit     Supine to sit: HOB elevated, Max assist, +2 for physical assistance     General bed mobility comments: helicopter method and increased time to follow commands to assist with lifting trunk, etc    Transfers Overall  transfer level: Needs assistance Equipment used: 2 person hand held assist Transfers: Bed to chair/wheelchair/BSC       Squat pivot transfers: Max assist, +2 physical assistance     General transfer comment: attempted Stedy x 1 but pt unable to effectively use UE's to pull up and leaning R so removed and placed chair close for squat pivot with +2 A    Ambulation/Gait                   Stairs             Wheelchair Mobility    Modified Rankin (Stroke Patients Only)       Balance Overall balance assessment: Needs assistance Sitting-balance support: Feet supported, Feet unsupported Sitting balance-Leahy Scale: Poor Sitting balance - Comments: on EOB initially minA, able to progress to periods of supervision, but eventually leaning R and posterior, needing assist to find center again Postural control: Posterior lean, Right lateral lean   Standing balance-Leahy Scale: Zero Standing balance comment: +2 for standing attempts                            Cognition Arousal/Alertness: Awake/alert Behavior During Therapy: Flat affect Overall Cognitive Status: Impaired/Different from baseline Area of Impairment: Orientation, Attention, Following commands, Safety/judgement, Problem solving, Memory                 Orientation Level: Disoriented to, Place, Situation, Time Current Attention Level: Sustained Memory: Decreased short-term memory Following Commands: Follows one step commands inconsistently, Follows one step commands with increased time Safety/Judgement:  Decreased awareness of deficits, Decreased awareness of safety   Problem Solving: Slow processing, Decreased initiation, Difficulty sequencing, Requires verbal cues, Requires tactile cues General Comments: able to state in "hospital" with significant delay and choice of home or hospital        Exercises General Exercises - Lower Extremity Ankle Circles/Pumps: AAROM, Both, 5 reps,  Supine Heel Slides: AAROM, Both, 5 reps, Supine Other Exercises Other Exercises: cervical AAROM rotation minimal range as pt c/o pain x 5 reps    General Comments        Pertinent Vitals/Pain Pain Assessment Pain Assessment: Faces Faces Pain Scale: Hurts little more Pain Location: moaning with cervical and ankle ROM Pain Descriptors / Indicators: Grimacing, Guarding, Moaning Pain Intervention(s): Monitored during session, Limited activity within patient's tolerance    Home Living                          Prior Function            PT Goals (current goals can now be found in the care plan section) Progress towards PT goals: Progressing toward goals    Frequency    Min 2X/week      PT Plan Current plan remains appropriate    Co-evaluation              AM-PAC PT "6 Clicks" Mobility   Outcome Measure  Help needed turning from your back to your side while in a flat bed without using bedrails?: Total Help needed moving from lying on your back to sitting on the side of a flat bed without using bedrails?: Total Help needed moving to and from a bed to a chair (including a wheelchair)?: Total Help needed standing up from a chair using your arms (e.g., wheelchair or bedside chair)?: Total Help needed to walk in hospital room?: Total Help needed climbing 3-5 steps with a railing? : Total 6 Click Score: 6    End of Session Equipment Utilized During Treatment: Gait belt Activity Tolerance: Patient limited by pain Patient left: in chair;with call bell/phone within reach;with chair alarm set Nurse Communication: Need for lift equipment PT Visit Diagnosis: Other abnormalities of gait and mobility (R26.89);Muscle weakness (generalized) (M62.81)     Time: 5945-8592 PT Time Calculation (min) (ACUTE ONLY): 31 min  Charges:  $Therapeutic Activity: 23-37 mins                    Magda Kiel, PT Acute Rehabilitation  Services [phone] 12/21/2021    Reginia Naas 12/21/2021, 12:45 PM

## 2021-12-21 NOTE — Progress Notes (Signed)
[facility]  PROGRESS NOTE  Elizabeth Crosby UEA:[id number] DOB: Jun 14, 1945 DOA: 12/14/2021 PCP: [name], MD   Brief HPI:   76 year old female with past medical history of CKD stage IV, hypertension, diabetes mellitus type 2, poor dentition, recent discharge from the hospital on 10/09/2021 for staph  lugdunensis  bacteremia was brought to hospital for acute encephalopathy and not following commands.  In route to hospital she was noted to have seizure like activity and was intubated for airway protection, code stroke was called, CT of the head was negative and was felt to be postictal from seizure, she was loaded with Keppra and transferred to [facility] for long-term EEG.  MRI of the brain showed no acute intracranial normality and T2 hyperintensity. Significant Events: 9/15 admitted with working diagnosis of seizure.  Seized in route to the emergency room and was unable to protect airway on arrival.  Intubated.  Code stroke called.  CTH negative for stroke.   Loaded with IV Keppra, transferred to [facility] for MRI of brain and LTM-EEG monitoring. 9/17: MRI completed with areas of chronic siderosis felt possibly secondary to remote hypoxic ischemic event.   9/17 Echocardiogram with EF 70 to 75% no regional wall abnormality grade 1 diastolic dysfunction RV systolic function normal left atrial size mildly dilated agitated saline contrast bubble negative 9/18:  Extubated.  Very awake postextubation.  Concern for need for reintubation, had goals of care discussion.  Patient DO NOT RESUSCITATE, no reintubation. 9/19 remaining weak, transfer to progressive     Subjective   Patient seen and examined, continues to lethargic.  Responds to questions by nodding her head.   Assessment/Plan:    New onset seizures -No clear etiology, MRI brain showed no acute finding, but showed remote history of looks like prior hypoxic insult -Neurology was consulted, patient given Keppra; continue Keppra  twice a day -Patient will need skilled nursing facility -Neurology to follow-up as outpatient  Acute metabolic encephalopathy -Continues to be lethargic -Likely from [place]  Acute respiratory failure with hypoxemia -Patient was intubated to protect the airway; s/p extubation -Patient is DNR, she is also DNI -Patient is currently n.p.o. as per speech therapy recommendations -Might need swallow evaluation next week if failed may be a candidate for PEG tube -continue medications through NG tube -Has been weaned off to room air -Completed 10 days course of antibiotics  Pseudomonas pneumonia -Required intubation, now extubated -Sputum culture grew Pseudomonas -Continue IV cefepime for total 10 days, last day 12/24/2021  Nutrition -NG tube feedings -Speech therapy saw patient recommended to be n.p.o. -Need to reevaluate next week; may be a candidate for PEG tube if no improvement in mentation and swallowing -Was started on  free water per tube every 12 hours; changed to every 6 hours sodium is 147 today  Acute kidney injury on CKD stage IIIa -Baseline creatinine 1.1-1.2 -Creatinine is 1.11 today, back to baseline  Hypotension -Resolved, patient was started on midodrine which is currently on hold -Will monitor blood pressure closely  Hypertension -Antihypertensive medication on hold due to low blood pressure  Diabetes mellitus type 2 -Continue sliding scale insulin NovoLog, insulin detemir -CBG well controlled  Nutrition: Patient failed swallowing evaluation. Core track placed 12/18/2021. Nutrition has been consulted and she has been started on enteral feedings.   Leukocytosis -Resolved   GERD (gastroesophageal reflux disease) Continue PPI.   Normocytic anemia: -Hemoglobin is 8.1 today Follow CBC in a.m.     Medications     Chlorhexidine Gluconate Cloth  6 each Topical Daily   docusate  100 mg Per Tube BID   feeding supplement (PROSource TF20)  60 mL Per Tube  Daily   free water  200 mL Per Tube Q12H   heparin injection (subcutaneous)  5,000 Units Subcutaneous Q8H   insulin aspart  0-15 Units Subcutaneous Q4H   insulin detemir  5 Units Subcutaneous Daily   pantoprazole  40 mg Per Tube Daily   polyethylene glycol  17 g Per Tube Daily   sodium chloride flush  10-40 mL Intracatheter Q12H     Data Reviewed:   CBG:  Recent Labs  Lab 12/20/21 2022 12/20/21 2112 12/21/21 0041 12/21/21 0446 12/21/21 0757  GLUCAP 189* 183* 154* 157* 156*    SpO2: 99 % O2 Flow Rate (L/min): 4 L/min FiO2 (%): 30 %    Vitals:   12/21/21 0045 12/21/21 0410 12/21/21 0500 12/21/21 0758  BP: (!) 114/42 104/66    Pulse: 95 95    Resp: '19 14  16  '$ Temp: 98 F (36.7 C) 98.2 F (36.8 C)  98.9 F (37.2 C)  TempSrc: Oral Oral  Oral  SpO2: 99% 99%    Weight:   88.1 kg   Height:          Data Reviewed:  Basic Metabolic Panel: Recent Labs  Lab 12/14/21 1544 12/14/21 2256 12/15/21 0021 12/15/21 0126 12/15/21 0326 12/15/21 1356 12/16/21 0612 12/17/21 1020 12/18/21 0339 12/19/21 2110 12/20/21 0420 12/21/21 0439  NA  --  141   < > 141   < >  --  136 136 138 142 144 147*  K  --  3.5   < > 3.6   < >  --  3.8 4.2 4.2 4.5 4.5 4.7  CL  --  112*  --  111  --   --  106 110 112* 113* 115* 116*  CO2  --  20*  --  20*  --   --  18* 21* 21* '22 23 23  '$ GLUCOSE  --  36*  --  114*  --   --  196* 162* 100* 111* 140* 160*  BUN  --  72*  --  71*  --   --  69* 62* 59* 64* 63* 58*  CREATININE  --  1.77*  --  1.80*  --   --  1.87* 1.88* 1.74* 1.30* 1.20* 1.11*  CALCIUM  --  8.4*  --  8.6*  --   --  8.4* 8.0* 8.2* 8.7* 8.8* 9.1  MG 2.0 1.9  --  2.0  --  1.9 1.9  --   --   --   --   --   PHOS  --   --   --   --   --  3.4 3.8  --   --   --   --   --    < > = values in this interval not displayed.    CBC: Recent Labs  Lab 12/14/21 1400 12/14/21 1509 12/15/21 0326 12/15/21 0407 12/16/21 0612 12/19/21 2110 12/20/21 0420  WBC 10.1  --   --  15.3* 17.5* 10.9*  8.8  NEUTROABS 6.6  --   --  9.7*  --  6.5  --   HGB 11.1*   < > 8.2* 9.9* 9.6* 8.8* 8.1*  HCT 31.3*   < > 24.0* 27.6* 27.1* 25.3* 24.2*  MCV 91.8  --   --  90.5 92.5 93.7 95.3  PLT 226  --   --  223 202 192 171   < > = values in this interval not displayed.    LFT Recent Labs  Lab 12/14/21 1400  AST 40  ALT 29  ALKPHOS 121  BILITOT 1.0  PROT 6.9  ALBUMIN 3.1*     Antibiotics: Anti-infectives (From admission, onward)    Start     Dose/Rate Route Frequency Ordered Stop   12/17/21 1300  vancomycin (VANCOREADY) IVPB 500 mg/100 mL  Status:  Discontinued        500 mg 100 mL/hr over 60 Minutes Intravenous Every 24 hours 12/16/21 1059 12/17/21 0928   12/16/21 1145  vancomycin (VANCOCIN) IVPB 1000 mg/200 mL premix        1,000 mg 200 mL/hr over 60 Minutes Intravenous  Once 12/16/21 1059 12/16/21 1307   12/16/21 1145  ceFEPIme (MAXIPIME) 2 g in sodium chloride 0.9 % 100 mL IVPB        2 g 200 mL/hr over 30 Minutes Intravenous Every 24 hours 12/16/21 1059 12/24/21 0915        DVT prophylaxis: Heparin  Code Status: DNR  Family Communication: No family at bedside   CONSULTS    Objective    Physical Examination:   General: Appears lethargic Cardiovascular: S1-S2, regular, no murmur auscultated Respiratory: Clear to auscultation bilaterally Abdomen: Abdomen is soft, nontender, no organomegaly Extremities: No edema in the lower extremities Neurologic: Alert , Oriented x3, follows commands, moving all extremities   Status is: Inpatient:             Oswald Hillock   [facility] If 7PM-7AM, please contact night-coverage at [email or web address], Office  [phone]   12/21/2021, 11:25 AM  LOS: 7 days

## 2021-12-21 NOTE — Progress Notes (Signed)
Speech Language Pathology Treatment: Dysphagia  Patient Details Name: Elizabeth Crosby MRN: [id number] DOB: Sep 11, 1945 Today's Date: 12/21/2021 Time: 2633-3545 SLP Time Calculation (min) (ACUTE ONLY): 11 min  Assessment / Plan / Recommendation Clinical Impression  Pt had her eyes open upon SLP arrival but continues to struggle with adequate alertness to allow for PO intake. With Max cues she can be alert enough to try a single ice chip or a small sip of water from a straw. She still needs step-by-step cues through the process, being told to open her mouth, close her mouth, suck on the straw, etc. Mentation remains likely her biggest barrier to PO intake, although assessment of underlying swallow function still remains limited. Recommend that she remain NPO at this time.    HPI HPI: Pt is a 76 yo female admitted from SNF new onset seizure requiring intubation 9/15-9/18. MRI negative for acute findings. Pt seen by SLP in July 2023 for post-extubation dysphagia. MBS 10/05/21 showed aspiration (PAS 7) of thin liquids, recommending Dys 3 diet and nectar thick liquids. She was advanced to regular solids/thin liquids prior to d/c as she was able to consistently take small, single sips. PMH incluides: HTN, CKD, glaucoma, gout, anxiety, HLD      SLP Plan  Continue with current plan of care      Recommendations for follow up therapy are one component of a multi-disciplinary discharge planning process, led by the attending physician.  Recommendations may be updated based on patient status, additional functional criteria and insurance authorization.    Recommendations  Diet recommendations: NPO Medication Administration: Via alternative means                Oral Care Recommendations: Oral care QID Follow Up Recommendations: Skilled nursing-short term rehab (<3 hours/day) Assistance recommended at discharge: Frequent or constant Supervision/Assistance SLP Visit Diagnosis: Dysphagia, unspecified  (R13.10) Plan: Continue with current plan of care           Osie Bond., M.A. [place] Office [phone]  Secure chat preferred   12/21/2021, 10:43 AM

## 2021-12-21 NOTE — Plan of Care (Signed)
  Problem: Education: Goal: Ability to describe self-care measures that may prevent or decrease complications (Diabetes Survival Skills Education) will improve Outcome: Not Progressing Goal: Individualized Educational Video(s) Outcome: Not Progressing   Problem: Coping: Goal: Ability to adjust to condition or change in health will improve Outcome: Not Progressing   Problem: Fluid Volume: Goal: Ability to maintain a balanced intake and output will improve Outcome: Not Progressing   Problem: Health Behavior/Discharge Planning: Goal: Ability to identify and utilize available resources and services will improve Outcome: Not Progressing Goal: Ability to manage health-related needs will improve Outcome: Not Progressing   Problem: Metabolic: Goal: Ability to maintain appropriate glucose levels will improve Outcome: Not Progressing   Problem: Nutritional: Goal: Maintenance of adequate nutrition will improve Outcome: Not Progressing Goal: Progress toward achieving an optimal weight will improve Outcome: Not Progressing   Problem: Skin Integrity: Goal: Risk for impaired skin integrity will decrease Outcome: Not Progressing   Problem: Tissue Perfusion: Goal: Adequacy of tissue perfusion will improve Outcome: Not Progressing   Problem: Activity: Goal: Ability to tolerate increased activity will improve Outcome: Not Progressing   Problem: Respiratory: Goal: Ability to maintain a clear airway and adequate ventilation will improve Outcome: Not Progressing   Problem: Role Relationship: Goal: Method of communication will improve Outcome: Not Progressing   Problem: Safety: Goal: Non-violent Restraint(s) Outcome: Not Progressing   Problem: Education: Goal: Knowledge of General Education information will improve Description: Including pain rating scale, medication(s)/side effects and non-pharmacologic comfort measures Outcome: Not Progressing   Problem: Health  Behavior/Discharge Planning: Goal: Ability to manage health-related needs will improve Outcome: Not Progressing   Problem: Clinical Measurements: Goal: Ability to maintain clinical measurements within normal limits will improve Outcome: Not Progressing Goal: Will remain free from infection Outcome: Not Progressing Goal: Diagnostic test results will improve Outcome: Not Progressing Goal: Respiratory complications will improve Outcome: Not Progressing Goal: Cardiovascular complication will be avoided Outcome: Not Progressing   Problem: Activity: Goal: Risk for activity intolerance will decrease Outcome: Not Progressing   Problem: Nutrition: Goal: Adequate nutrition will be maintained Outcome: Not Progressing   Problem: Coping: Goal: Level of anxiety will decrease Outcome: Not Progressing   Problem: Elimination: Goal: Will not experience complications related to bowel motility Outcome: Not Progressing Goal: Will not experience complications related to urinary retention Outcome: Not Progressing   Problem: Pain Managment: Goal: General experience of comfort will improve Outcome: Not Progressing   Problem: Safety: Goal: Ability to remain free from injury will improve Outcome: Not Progressing   Problem: Skin Integrity: Goal: Risk for impaired skin integrity will decrease Outcome: Not Progressing

## 2021-12-21 NOTE — Consult Note (Signed)
   [facility] Inpatient Consult   12/21/2021  ALLISSA ALBRIGHT September 01, 1945 [id number]  [place] Organization [ACO] Patient: [employer] Medicare  Primary Care Provider:  [name], MD, [place], is an Embedded provider that is listed with [employer]  Patient was referred by [facility] Coordinator.  Review of patient's medical record reveals patient is***   Plan:  Continue to follow progress and disposition to assess for post hospital care management needs.    For questions contact:   Natividad Brood, RN BSN [facility] Liaison  [phone] business mobile phone Toll free office [phone]  Fax number: [phone] [email or web address]

## 2021-12-22 ENCOUNTER — Inpatient Hospital Stay (HOSPITAL_COMMUNITY): Payer: Medicare Other

## 2021-12-22 DIAGNOSIS — R569 Unspecified convulsions: Secondary | ICD-10-CM | POA: Diagnosis not present

## 2021-12-22 DIAGNOSIS — E669 Obesity, unspecified: Secondary | ICD-10-CM | POA: Diagnosis present

## 2021-12-22 DIAGNOSIS — N179 Acute kidney failure, unspecified: Secondary | ICD-10-CM | POA: Diagnosis not present

## 2021-12-22 DIAGNOSIS — J9600 Acute respiratory failure, unspecified whether with hypoxia or hypercapnia: Secondary | ICD-10-CM | POA: Diagnosis not present

## 2021-12-22 DIAGNOSIS — G9341 Metabolic encephalopathy: Secondary | ICD-10-CM | POA: Diagnosis not present

## 2021-12-22 LAB — COMPREHENSIVE METABOLIC PANEL
ALT: 15 U/L (ref 0–44)
AST: 22 U/L (ref 15–41)
Albumin: 1.8 g/dL — ABNORMAL LOW (ref 3.5–5.0)
Alkaline Phosphatase: 139 U/L — ABNORMAL HIGH (ref 38–126)
Anion gap: 5 (ref 5–15)
BUN: 56 mg/dL — ABNORMAL HIGH (ref 8–23)
CO2: 25 mmol/L (ref 22–32)
Calcium: 9.1 mg/dL (ref 8.9–10.3)
Chloride: 118 mmol/L — ABNORMAL HIGH (ref 98–111)
Creatinine, Ser: 1.03 mg/dL — ABNORMAL HIGH (ref 0.44–1.00)
GFR, Estimated: 57 mL/min — ABNORMAL LOW (ref >60)
Glucose, Bld: 163 mg/dL — ABNORMAL HIGH (ref 70–99)
Potassium: 5 mmol/L (ref 3.5–5.1)
Sodium: 148 mmol/L — ABNORMAL HIGH (ref 135–145)
Total Bilirubin: 0.5 mg/dL (ref 0.3–1.2)
Total Protein: 5.2 g/dL — ABNORMAL LOW (ref 6.5–8.1)

## 2021-12-22 LAB — GLUCOSE, CAPILLARY
Glucose-Capillary: 155 mg/dL — ABNORMAL HIGH (ref 70–99)
Glucose-Capillary: 161 mg/dL — ABNORMAL HIGH (ref 70–99)
Glucose-Capillary: 164 mg/dL — ABNORMAL HIGH (ref 70–99)
Glucose-Capillary: 170 mg/dL — ABNORMAL HIGH (ref 70–99)
Glucose-Capillary: 175 mg/dL — ABNORMAL HIGH (ref 70–99)
Glucose-Capillary: 194 mg/dL — ABNORMAL HIGH (ref 70–99)

## 2021-12-22 LAB — CBC
HCT: 24.7 % — ABNORMAL LOW (ref 36.0–46.0)
Hemoglobin: 8.5 g/dL — ABNORMAL LOW (ref 12.0–15.0)
MCH: 32.8 pg (ref 26.0–34.0)
MCHC: 34.4 g/dL (ref 30.0–36.0)
MCV: 95.4 fL (ref 80.0–100.0)
Platelets: 163 10*3/uL (ref 150–400)
RBC: 2.59 MIL/uL — ABNORMAL LOW (ref 3.87–5.11)
RDW: 13.9 % (ref 11.5–15.5)
WBC: 10 10*3/uL (ref 4.0–10.5)
nRBC: 0 % (ref 0.0–0.2)

## 2021-12-22 LAB — CBC WITH DIFFERENTIAL/PLATELET
Abs Immature Granulocytes: 0.06 10*3/uL (ref 0.00–0.07)
Basophils Absolute: 0.1 10*3/uL (ref 0.0–0.1)
Basophils Relative: 1 %
Eosinophils Absolute: 0.3 10*3/uL (ref 0.0–0.5)
Eosinophils Relative: 3 %
HCT: 25.1 % — ABNORMAL LOW (ref 36.0–46.0)
Hemoglobin: 8.6 g/dL — ABNORMAL LOW (ref 12.0–15.0)
Immature Granulocytes: 1 %
Lymphocytes Relative: 20 %
Lymphs Abs: 2 10*3/uL (ref 0.7–4.0)
MCH: 32.8 pg (ref 26.0–34.0)
MCHC: 34.3 g/dL (ref 30.0–36.0)
MCV: 95.8 fL (ref 80.0–100.0)
Monocytes Absolute: 1.3 10*3/uL — ABNORMAL HIGH (ref 0.1–1.0)
Monocytes Relative: 13 %
Neutro Abs: 6.3 10*3/uL (ref 1.7–7.7)
Neutrophils Relative %: 62 %
Platelets: 168 10*3/uL (ref 150–400)
RBC: 2.62 MIL/uL — ABNORMAL LOW (ref 3.87–5.11)
RDW: 14 % (ref 11.5–15.5)
WBC: 10 10*3/uL (ref 4.0–10.5)
nRBC: 0 % (ref 0.0–0.2)

## 2021-12-22 LAB — PHOSPHORUS: Phosphorus: 2.7 mg/dL (ref 2.5–4.6)

## 2021-12-22 LAB — MAGNESIUM: Magnesium: 1.9 mg/dL (ref 1.7–2.4)

## 2021-12-22 MED ORDER — ORAL CARE MOUTH RINSE
15.0000 mL | OROMUCOSAL | Status: DC
  Administered 2021-12-22 – 2021-12-28 (×23): 15 mL via OROMUCOSAL

## 2021-12-22 NOTE — Progress Notes (Signed)
PROGRESS NOTE    Elizabeth Crosby  ZOX:[id number] DOB: 1945-11-20 DOA: 12/14/2021 PCP: [name], MD     Brief Narrative:  76 year old WF PMHx CKD stage IV, essential HTN, DM type II, poor dentition, recent discharge from the hospital on 10/09/2021 for staph  lugdunensis  bacteremia   Brought to hospital for acute encephalopathy and not following commands.  In route to hospital she was noted to have seizure like activity and was intubated for airway protection, code stroke was called, CT of the head was negative and was felt to be postictal from seizure, she was loaded with Keppra and transferred to [facility] for long-term EEG.  MRI of the brain showed no acute intracranial normality and T2 hyperintensity.   Subjective: Alert, unable to speak but follows commands.  Nods her head yes and no to questions.   Assessment & Plan: Covid vaccination;   Principal Problem:   Seizures ([place]) Active Problems:   Leukocytosis   GERD (gastroesophageal reflux disease)   Morbid obesity (HCC)   DM2 (diabetes mellitus, type 2) (HCC)   Acute metabolic encephalopathy   Acute renal failure superimposed on stage 4 chronic kidney disease (HCC)   History of endometrial cancer   Anemia   Protein-calorie malnutrition, severe (HCC)   Severe muscle deconditioning   DNR (do not resuscitate)   Pseudomonas pneumonia ([place])  New onset seizures -No clear etiology, MRI brain showed no acute finding, but showed remote history of looks like prior hypoxic insult -Neurology was consulted,  -Keppra 500 mg BID -Discharge to SNF   Acute metabolic encephalopathy -Continues to be lethargic -Follows commands, but does not speak.   Acute respiratory failure with hypoxemia -Patient was intubated to protect the airway; s/p extubation -Patient is currently n.p.o. as per speech therapy recommendations -9/23 after evaluation patient very unlikely to pass swallow evaluation but would give her chance prior to  discharging to SNF.  When she fails within have PEG placed.  At extremely high risk for aspiration.   -Completed 10 days course of antibiotics   Pseudomonas pneumonia -Required intubation, now extubated -Sputum culture grew Pseudomonas -Continue IV cefepime for total 10 days, last day 12/24/2021   Nutrition -9/19 core track placed . -started on enteral feedings    Acute kidney injury on CKD stage IIIa (baseline Cr 1.1-1.2) Lab Results  Component Value Date   CREATININE 1.03 (H) 12/22/2021   CREATININE 1.11 (H) 12/21/2021   CREATININE 1.20 (H) 12/20/2021   CREATININE 1.30 (H) 12/19/2021   CREATININE 1.74 (H) 12/18/2021  -Baseline   Hypotension -Resolved,    Hypertension -Antihypertensive medication on hold due to low blood pressure   DM type II controlled with Hyperglycemia -7/5 hemoglobin A1c= 5.7 -Levemir 5 units daily - Moderate SSI   Leukocytosis -Resolved   Subcu heparin Continue PPI.   Normocytic anemia: Lab Results  Component Value Date   HGB 8.6 (L) 12/22/2021   HGB 8.5 (L) 12/22/2021   HGB 8.1 (L) 12/20/2021   HGB 8.8 (L) 12/19/2021   HGB 9.6 (L) 12/16/2021    Obesity (BMI33.32 kg/m.)       Mobility Assessment (last 72 hours)     Mobility Assessment     Row Name 12/22/21 0338 12/21/21 2344 12/21/21 1959 12/21/21 0410 12/21/21 0045   Does patient have an order for bedrest or is patient medically unstable No - Continue assessment No - Continue assessment No - Continue assessment No - Continue assessment No - Continue assessment   What is the  highest level of mobility based on the progressive mobility assessment? Level 1 (Bedfast) - Unable to balance while sitting on edge of bed Level 1 (Bedfast) - Unable to balance while sitting on edge of bed Level 1 (Bedfast) - Unable to balance while sitting on edge of bed Level 1 (Bedfast) - Unable to balance while sitting on edge of bed Level 1 (Bedfast) - Unable to balance while sitting on edge of bed   Is  the above level different from baseline mobility prior to current illness? Yes - Recommend PT order Yes - Recommend PT order Yes - Recommend PT order Yes - Recommend PT order Yes - Recommend PT order    [place] Name 12/20/21 2042 12/20/21 1959 12/19/21 2040 12/19/21 0800     Does patient have an order for bedrest or is patient medically unstable No - Continue assessment No - Continue assessment No - Continue assessment No - Continue assessment    What is the highest level of mobility based on the progressive mobility assessment? Level 1 (Bedfast) - Unable to balance while sitting on edge of bed Level 1 (Bedfast) - Unable to balance while sitting on edge of bed Level 1 (Bedfast) - Unable to balance while sitting on edge of bed Level 1 (Bedfast) - Unable to balance while sitting on edge of bed    Is the above level different from baseline mobility prior to current illness? Yes - Recommend PT order Yes - Recommend PT order Yes - Recommend PT order Yes - Recommend PT order                DVT prophylaxis: Subcu heparin Code Status: DNR Family Communication:  Status is: Inpatient    Dispo: The patient is from: Home              Anticipated d/c is to: SNF              Anticipated d/c date is: > 3 days              Patient currently is not medically stable to d/c.      Consultants:  Neurology    Procedures/Significant Events:  9/15 admitted with working diagnosis of seizure.  Seized in route to the emergency room and was unable to protect airway on arrival.  Intubated.  Code stroke called.  CTH negative for stroke.   Loaded with IV Keppra, transferred to [facility] for MRI of brain and LTM-EEG monitoring. 9/17: MRI completed with areas of chronic siderosis felt possibly secondary to remote hypoxic ischemic event.   9/17 Echocardiogram with EF 70 to 75% no regional wall abnormality grade 1 diastolic dysfunction RV systolic function normal left atrial size mildly dilated agitated saline  contrast bubble negative 9/18:  Extubated.  Very awake postextubation.  Concern for need for reintubation, had goals of care discussion.  Patient DO NOT RESUSCITATE, no reintubation. 9/19 remaining weak, transfer to progressive    I have personally reviewed and interpreted all radiology studies and my findings are as above.  VENTILATOR SETTINGS:    Cultures   Antimicrobials: Anti-infectives (From admission, onward)    Start     Ordered Stop   12/17/21 1300  vancomycin (VANCOREADY) IVPB 500 mg/100 mL  Status:  Discontinued        12/16/21 1059 12/17/21 0928   12/16/21 1145  vancomycin (VANCOCIN) IVPB 1000 mg/200 mL premix        12/16/21 1059 12/16/21 1307   12/16/21 1145  ceFEPIme (  MAXIPIME) 2 g in sodium chloride 0.9 % 100 mL IVPB        12/16/21 1059 12/24/21 0915         Devices    LINES / TUBES:      Continuous Infusions:  sodium chloride 250 mL (12/18/21 1817)   ceFEPime (MAXIPIME) IV Stopped (12/21/21 1233)   feeding supplement (JEVITY 1.2 CAL) 50 mL/hr at 12/22/21 0014   levETIRAcetam Stopped (12/21/21 2128)     Objective: Vitals:   12/21/21 1900 12/21/21 2323 12/22/21 0324 12/22/21 0500  BP: (!) 146/67 (!) 145/68 (!) 124/49   Pulse: 91 90 83   Resp: '15 17 16   '$ Temp: 98.4 F (36.9 C) 98.1 F (36.7 C) 98.7 F (37.1 C)   TempSrc: Axillary Axillary Axillary   SpO2: 91% 100% 98%   Weight:    80 kg  Height:        Intake/Output Summary (Last 24 hours) at 12/22/2021 0737 Last data filed at 12/22/2021 1884 Gross per 24 hour  Intake 2211.34 ml  Output 1450 ml  Net 761.34 ml   Filed Weights   12/20/21 0408 12/21/21 0500 12/22/21 0500  Weight: 87.7 kg 88.1 kg 80 kg    Examination:  General: Alert follows commands No acute respiratory distress Eyes: negative scleral hemorrhage, negative anisocoria, negative icterus ENT: Negative Runny nose, negative gingival bleeding, Neck:  Negative scars, masses, torticollis, lymphadenopathy, JVD Lungs:  rhonchorous bilaterally without wheezes or crackles Cardiovascular: Regular rate and rhythm without murmur gallop or rub normal S1 and S2 Abdomen: negative abdominal pain, nondistended, positive soft, bowel sounds, no rebound, no ascites, no appreciable mass Extremities: No significant cyanosis, clubbing, or edema bilateral lower extremities Skin: Negative rashes, lesions, ulcers Psychiatric: Unable to evaluate given that patient unable to speak Central nervous system:  Cranial nerves II through XII intact, tongue/uvula midline, all extremities muscle strength 2-3 /5, sensation intact throughout,   .     Data Reviewed: Care during the described time interval was provided by me .  I have reviewed this patient's available data, including medical history, events of note, physical examination, and all test results as part of my evaluation.  CBC: Recent Labs  Lab 12/16/21 0612 12/19/21 2110 12/20/21 0420 12/22/21 0640  WBC 17.5* 10.9* 8.8 10.0  NEUTROABS  --  6.5  --   --   HGB 9.6* 8.8* 8.1* 8.5*  HCT 27.1* 25.3* 24.2* 24.7*  MCV 92.5 93.7 95.3 95.4  PLT 202 192 171 166   Basic Metabolic Panel: Recent Labs  Lab 12/15/21 1356 12/16/21 0612 12/16/21 0612 12/17/21 1020 12/18/21 0339 12/19/21 2110 12/20/21 0420 12/21/21 0439  NA  --  136   < > 136 138 142 144 147*  K  --  3.8   < > 4.2 4.2 4.5 4.5 4.7  CL  --  106   < > 110 112* 113* 115* 116*  CO2  --  18*   < > 21* 21* '22 23 23  '$ GLUCOSE  --  196*   < > 162* 100* 111* 140* 160*  BUN  --  69*   < > 62* 59* 64* 63* 58*  CREATININE  --  1.87*   < > 1.88* 1.74* 1.30* 1.20* 1.11*  CALCIUM  --  8.4*   < > 8.0* 8.2* 8.7* 8.8* 9.1  MG 1.9 1.9  --   --   --   --   --   --   PHOS 3.4 3.8  --   --   --   --   --   --    < > =  values in this interval not displayed.   GFR: Estimated Creatinine Clearance: 42 mL/min (A) (by C-G formula based on SCr of 1.11 mg/dL (H)). Liver Function Tests: No results for input(s): "AST", "ALT",  "ALKPHOS", "BILITOT", "PROT", "ALBUMIN" in the last 168 hours. No results for input(s): "LIPASE", "AMYLASE" in the last 168 hours. No results for input(s): "AMMONIA" in the last 168 hours. Coagulation Profile: No results for input(s): "INR", "PROTIME" in the last 168 hours. Cardiac Enzymes: No results for input(s): "CKTOTAL", "CKMB", "CKMBINDEX", "TROPONINI" in the last 168 hours. BNP (last 3 results) No results for input(s): "PROBNP" in the last 8760 hours. HbA1C: No results for input(s): "HGBA1C" in the last 72 hours. CBG: Recent Labs  Lab 12/21/21 1149 12/21/21 1517 12/21/21 2001 12/21/21 2322 12/22/21 0324  GLUCAP 169* 162* 175* 176* 161*   Lipid Profile: No results for input(s): "CHOL", "HDL", "LDLCALC", "TRIG", "CHOLHDL", "LDLDIRECT" in the last 72 hours. Thyroid Function Tests: No results for input(s): "TSH", "T4TOTAL", "FREET4", "T3FREE", "THYROIDAB" in the last 72 hours. Anemia Panel: No results for input(s): "VITAMINB12", "FOLATE", "FERRITIN", "TIBC", "IRON", "RETICCTPCT" in the last 72 hours. Sepsis Labs: Recent Labs  Lab 12/17/21 1020 12/18/21 0339 12/19/21 2110  PROCALCITON 2.31 1.62 1.77    Recent Results (from the past 240 hour(s))  Resp Panel by RT-PCR (Flu A&B, Covid) Anterior Nasal Swab     Status: None   Collection Time: 12/14/21  2:08 PM   Specimen: Anterior Nasal Swab  Result Value Ref Range Status   SARS Coronavirus 2 by RT PCR NEGATIVE NEGATIVE Final    Comment: (NOTE) SARS-CoV-2 target nucleic acids are NOT DETECTED.  The SARS-CoV-2 RNA is generally detectable in upper respiratory specimens during the acute phase of infection. The lowest concentration of SARS-CoV-2 viral copies this assay can detect is 138 copies/mL. A negative result does not preclude SARS-Cov-2 infection and should not be used as the sole basis for treatment or other patient management decisions. A negative result may occur with  improper specimen collection/handling,  submission of specimen other than nasopharyngeal swab, presence of viral mutation(s) within the areas targeted by this assay, and inadequate number of viral copies(<138 copies/mL). A negative result must be combined with clinical observations, patient history, and epidemiological information. The expected result is Negative.  Fact Sheet for Patients:  EntrepreneurPulse.com.au  Fact Sheet for Healthcare Providers:  IncredibleEmployment.be  This test is no t yet approved or cleared by the [place] FDA and  has been authorized for detection and/or diagnosis of SARS-CoV-2 by FDA under an Emergency Use Authorization (EUA). This EUA will remain  in effect (meaning this test can be used) for the duration of the COVID-19 declaration under Section 564(b)(1) of the Act, 21 U.S.C.section 360bbb-3(b)(1), unless the authorization is terminated  or revoked sooner.       Influenza A by PCR NEGATIVE NEGATIVE Final   Influenza B by PCR NEGATIVE NEGATIVE Final    Comment: (NOTE) The Xpert Xpress SARS-CoV-2/FLU/RSV plus assay is intended as an aid in the diagnosis of influenza from Nasopharyngeal swab specimens and should not be used as a sole basis for treatment. Nasal washings and aspirates are unacceptable for Xpert Xpress SARS-CoV-2/FLU/RSV testing.  Fact Sheet for Patients: EntrepreneurPulse.com.au  Fact Sheet for Healthcare Providers: IncredibleEmployment.be  This test is not yet approved or cleared by the [place] FDA and has been authorized for detection and/or diagnosis of SARS-CoV-2 by FDA under an Emergency Use Authorization (EUA). This EUA will remain in effect (meaning this test  can be used) for the duration of the COVID-19 declaration under Section 564(b)(1) of the Act, 21 U.S.C. section 360bbb-3(b)(1), unless the authorization is terminated or revoked.  Performed at [facility], [address], [place], [place] [address]   MRSA Next Gen by PCR, Nasal     Status: None   Collection Time: 12/14/21  9:41 PM   Specimen: Nasal Mucosa; Nasal Swab  Result Value Ref Range Status   MRSA by PCR Next Gen NOT DETECTED NOT DETECTED Final    Comment: (NOTE) The GeneXpert MRSA Assay (FDA approved for NASAL specimens only), is one component of a comprehensive MRSA colonization surveillance program. It is not intended to diagnose MRSA infection nor to guide or monitor treatment for MRSA infections. Test performance is not FDA approved in patients less than 79 years old. Performed at [facility], [place] [address] [place], [place] [address]   Culture, blood (Routine X 2) w Reflex to ID Panel     Status: None   Collection Time: 12/15/21  1:26 AM   Specimen: BLOOD RIGHT HAND  Result Value Ref Range Status   Specimen Description BLOOD RIGHT HAND  Final   Special Requests   Final    BOTTLES DRAWN AEROBIC AND ANAEROBIC Blood Culture adequate volume   Culture   Final    NO GROWTH 5 DAYS Performed at [facility], [place] [address] [place], [place] [address]    Report Status 12/20/2021 FINAL  Final  Culture, blood (Routine X 2) w Reflex to ID Panel     Status: None   Collection Time: 12/15/21  1:40 AM   Specimen: BLOOD LEFT HAND  Result Value Ref Range Status   Specimen Description BLOOD LEFT HAND  Final   Special Requests   Final    BOTTLES DRAWN AEROBIC AND ANAEROBIC Blood Culture results may not be optimal due to an inadequate volume of blood received in culture bottles   Culture   Final    NO GROWTH 5 DAYS Performed at [facility], [place] [address], [place], [place] [address]    Report Status 12/20/2021 FINAL  Final  Culture, Respiratory w Gram Stain     Status: None   Collection Time: 12/16/21  8:46 AM   Specimen: Tracheal Aspirate; Respiratory  Result Value Ref Range Status   Specimen Description TRACHEAL ASPIRATE  Final   Special Requests NONE  Final   Gram Stain    Final    ABUNDANT WBC PRESENT,BOTH PMN AND MONONUCLEAR MODERATE GRAM NEGATIVE RODS Performed at [facility], [place] [address] [place], [place] [address]    Culture FEW PSEUDOMONAS AERUGINOSA  Final   Report Status 12/19/2021 FINAL  Final   Organism ID, Bacteria PSEUDOMONAS AERUGINOSA  Final      Susceptibility   Pseudomonas aeruginosa - MIC*    CEFTAZIDIME 4 SENSITIVE Sensitive     CIPROFLOXACIN <=0.25 SENSITIVE Sensitive     GENTAMICIN <=1 SENSITIVE Sensitive     IMIPENEM 1 SENSITIVE Sensitive     PIP/TAZO <=4 SENSITIVE Sensitive     CEFEPIME 2 SENSITIVE Sensitive     * FEW PSEUDOMONAS AERUGINOSA         Radiology Studies: No results found.      Scheduled Meds:  Chlorhexidine Gluconate Cloth  6 each Topical Daily   docusate  100 mg Per Tube BID   feeding supplement (PROSource TF20)  60 mL Per Tube Daily   free water  200  mL Per Tube Q6H   heparin injection (subcutaneous)  5,000 Units Subcutaneous Q8H   insulin aspart  0-15 Units Subcutaneous Q4H   insulin detemir  5 Units Subcutaneous Daily   pantoprazole  40 mg Per Tube Daily   polyethylene glycol  17 g Per Tube Daily   sodium chloride flush  10-40 mL Intracatheter Q12H   Continuous Infusions:  sodium chloride 250 mL (12/18/21 1817)   ceFEPime (MAXIPIME) IV Stopped (12/21/21 1233)   feeding supplement (JEVITY 1.2 CAL) 50 mL/hr at 12/22/21 0014   levETIRAcetam Stopped (12/21/21 2128)     LOS: 8 days    Time spent:40 min    Jilda Kress, Geraldo Docker, MD [facility]   If 7PM-7AM, please contact night-coverage 12/22/2021, 7:37 AM

## 2021-12-22 NOTE — Plan of Care (Signed)
  Problem: Education: Goal: Ability to describe self-care measures that may prevent or decrease complications (Diabetes Survival Skills Education) will improve Outcome: Not Progressing Goal: Individualized Educational Video(s) Outcome: Not Progressing   Problem: Coping: Goal: Ability to adjust to condition or change in health will improve Outcome: Not Progressing   Problem: Fluid Volume: Goal: Ability to maintain a balanced intake and output will improve Outcome: Not Progressing   Problem: Health Behavior/Discharge Planning: Goal: Ability to identify and utilize available resources and services will improve Outcome: Not Progressing Goal: Ability to manage health-related needs will improve Outcome: Not Progressing   Problem: Metabolic: Goal: Ability to maintain appropriate glucose levels will improve Outcome: Not Progressing   Problem: Nutritional: Goal: Maintenance of adequate nutrition will improve Outcome: Not Progressing Goal: Progress toward achieving an optimal weight will improve Outcome: Not Progressing   Problem: Skin Integrity: Goal: Risk for impaired skin integrity will decrease Outcome: Not Progressing   Problem: Tissue Perfusion: Goal: Adequacy of tissue perfusion will improve Outcome: Not Progressing   Problem: Activity: Goal: Ability to tolerate increased activity will improve Outcome: Not Progressing   Problem: Respiratory: Goal: Ability to maintain a clear airway and adequate ventilation will improve Outcome: Not Progressing   Problem: Role Relationship: Goal: Method of communication will improve Outcome: Not Progressing   Problem: Safety: Goal: Non-violent Restraint(s) Outcome: Not Progressing   Problem: Education: Goal: Knowledge of General Education information will improve Description: Including pain rating scale, medication(s)/side effects and non-pharmacologic comfort measures Outcome: Not Progressing   Problem: Health  Behavior/Discharge Planning: Goal: Ability to manage health-related needs will improve Outcome: Not Progressing   Problem: Clinical Measurements: Goal: Ability to maintain clinical measurements within normal limits will improve Outcome: Not Progressing Goal: Will remain free from infection Outcome: Not Progressing Goal: Diagnostic test results will improve Outcome: Not Progressing Goal: Respiratory complications will improve Outcome: Not Progressing Goal: Cardiovascular complication will be avoided Outcome: Not Progressing   Problem: Activity: Goal: Risk for activity intolerance will decrease Outcome: Not Progressing   Problem: Nutrition: Goal: Adequate nutrition will be maintained Outcome: Not Progressing   Problem: Coping: Goal: Level of anxiety will decrease Outcome: Not Progressing   Problem: Elimination: Goal: Will not experience complications related to bowel motility Outcome: Not Progressing Goal: Will not experience complications related to urinary retention Outcome: Not Progressing   Problem: Pain Managment: Goal: General experience of comfort will improve Outcome: Not Progressing   Problem: Safety: Goal: Ability to remain free from injury will improve Outcome: Not Progressing   Problem: Skin Integrity: Goal: Risk for impaired skin integrity will decrease Outcome: Not Progressing

## 2021-12-23 DIAGNOSIS — R569 Unspecified convulsions: Secondary | ICD-10-CM | POA: Diagnosis not present

## 2021-12-23 LAB — GLUCOSE, CAPILLARY
Glucose-Capillary: 156 mg/dL — ABNORMAL HIGH (ref 70–99)
Glucose-Capillary: 163 mg/dL — ABNORMAL HIGH (ref 70–99)
Glucose-Capillary: 162 mg/dL — ABNORMAL HIGH (ref 70–99)
Glucose-Capillary: 149 mg/dL — ABNORMAL HIGH (ref 70–99)
Glucose-Capillary: 145 mg/dL — ABNORMAL HIGH (ref 70–99)

## 2021-12-23 NOTE — Progress Notes (Addendum)
PROGRESS NOTE    Elizabeth Crosby  ZOX:[id number] DOB: 01/29/1946 DOA: 12/14/2021 PCP: [name], MD   Brief Narrative:  76 year old female with past medical history of CKD stage IV, hypertension, diabetes mellitus type 2, poor dentition, recent discharge from the hospital on 10/09/2021 for staph  lugdunensis  bacteremia was brought to hospital for acute encephalopathy and not following commands.  In route to hospital she was noted to have seizure like activity and was intubated for airway protection, code stroke was called, CT of the head was negative and was felt to be postictal from seizure, she was loaded with Keppra and transferred to [facility] for long-term EEG.  MRI of the brain showed no acute intracranial normality and T2 hyperintensity. Significant Events: 9/15 admitted with working diagnosis of seizure.  Seized in route to the emergency room and was unable to protect airway on arrival.  Intubated.  Code stroke called.  CTH negative for stroke.   Loaded with IV Keppra, transferred to [facility] for MRI of brain and LTM-EEG monitoring. 9/17: MRI completed with areas of chronic siderosis felt possibly secondary to remote hypoxic ischemic event.   9/17 Echocardiogram with EF 70 to 75% no regional wall abnormality grade 1 diastolic dysfunction RV systolic function normal left atrial size mildly dilated agitated saline contrast bubble negative 9/18:  Extubated.  Very awake postextubation.  Concern for need for reintubation, had goals of care discussion.  Patient DO NOT RESUSCITATE, no reintubation. 9/19 remaining weak, transfer to progressive    Assessment & Plan:   Principal Problem:   Seizures ([place]) Active Problems:   Leukocytosis   GERD (gastroesophageal reflux disease)   Morbid obesity (HCC)   DM2 (diabetes mellitus, type 2) (HCC)   Acute metabolic encephalopathy   Acute renal failure superimposed on stage 4 chronic kidney disease (HCC)   History of endometrial cancer    Anemia   Protein-calorie malnutrition, severe (HCC)   Severe muscle deconditioning   DNR (do not resuscitate)   Pseudomonas pneumonia ([place])   Obesity (BMI 30-39.9)  New onset seizures:  -No clear etiology, MRI brain showed no acute finding, but showed remote history of what looks like prior hypoxic insult -Neurology was consulted, patient given Keppra; continue Keppra twice a day -Patient will need skilled nursing facility -Neurology to follow-up as outpatient   Acute metabolic encephalopathy: -Continues to be lethargic but oriented x2-3 when she communicates.   Acute respiratory failure with hypoxemia/Pseudomonas pneumonia -Patient was intubated to protect the airway; s/p extubation -Patient is DNR/DNI -Patient is currently n.p.o. as per speech therapy recommendations -Might need swallow evaluation next week if failed may be a candidate for PEG tube -continue medications through NG tube -Has been weaned off to room air -On cefepime currently, complete 10 days course of antibiotics   Nutrition -NG tube feedings, Core track placed 12/18/2021. -Speech therapy saw patient recommended to be n.p.o. -Need to reevaluate next week; may be a candidate for PEG tube if no improvement in mentation and swallowing  Hypernatremia: -Was started on  free water per tube every 12 hours; changed to every 6 hours sodium 148 on 12/22/2021.   Acute kidney injury on CKD stage IIIa -Baseline creatinine 1.1-1.2.  Creatinine now back to baseline.   Hypotension -Resolved, patient was started on midodrine which is currently on hold -Will monitor blood pressure closely   Hypertension -Antihypertensive medication on hold due to low blood pressure   Diabetes mellitus type 2 -Continue sliding scale insulin NovoLog, insulin detemir -CBG  well controlled   GERD (gastroesophageal reflux disease) Continue PPI.   Normocytic anemia: -Hemoglobin is 8.6 today Follow CBC in a.m.  DVT prophylaxis: heparin  injection 5,000 Units Start: 12/15/21 1445 Place and maintain sequential compression device Start: 12/14/21 2210   Code Status: DNR  Family Communication:  None present at bedside.  D/w son over phone.  Status is: Inpatient Remains inpatient appropriate because: Waiting for further evaluation from SLP   Estimated body mass index is 33.32 kg/m as calculated from the following:   Height as of this encounter: '5\' 1"'$  (1.549 m).   Weight as of this encounter: 80 kg.    Nutritional Assessment: Body mass index is 33.32 kg/m.Marland Kitchen Seen by dietician.  I agree with the assessment and plan as outlined below: Nutrition Status: Nutrition Problem: Inadequate oral intake Etiology: acute illness Signs/Symptoms: NPO status Interventions: Tube feeding  . Skin Assessment: I have examined the patient's skin and I agree with the wound assessment as performed by the wound care RN as outlined below:    Consultants:  Neurology-signed off  Procedures:  None  Antimicrobials:  Anti-infectives (From admission, onward)    Start     Dose/Rate Route Frequency Ordered Stop   12/17/21 1300  vancomycin (VANCOREADY) IVPB 500 mg/100 mL  Status:  Discontinued        500 mg 100 mL/hr over 60 Minutes Intravenous Every 24 hours 12/16/21 1059 12/17/21 0928   12/16/21 1145  vancomycin (VANCOCIN) IVPB 1000 mg/200 mL premix        1,000 mg 200 mL/hr over 60 Minutes Intravenous  Once 12/16/21 1059 12/16/21 1307   12/16/21 1145  ceFEPIme (MAXIPIME) 2 g in sodium chloride 0.9 % 100 mL IVPB        2 g 200 mL/hr over 30 Minutes Intravenous Every 24 hours 12/16/21 1059 12/24/21 0915         Subjective: Patient seen and examined.  She was sleepy but arousable.  Slightly lethargic.  Able to communicate.  She was oriented x2.  No complaints.  Objective: Vitals:   12/22/21 2342 12/23/21 0347 12/23/21 0818 12/23/21 0819  BP: (!) 136/50 (!) 150/50 (!) 154/55 (!) 154/55  Pulse: 91 90 86   Resp: '19 20 11 15  '$ Temp:  97.9 F (36.6 C) 98 F (36.7 C) 99.2 F (37.3 C) 98.8 F (37.1 C)  TempSrc: Oral Axillary Axillary Axillary  SpO2: 99% 100% 98%   Weight:      Height:        Intake/Output Summary (Last 24 hours) at 12/23/2021 1044 Last data filed at 12/23/2021 0824 Gross per 24 hour  Intake 1404.17 ml  Output 1700 ml  Net -295.83 ml   Filed Weights   12/20/21 0408 12/21/21 0500 12/22/21 0500  Weight: 87.7 kg 88.1 kg 80 kg    Examination:  General exam: Appears lethargic.  Has coretrack in place Respiratory system: Clear to auscultation. Respiratory effort normal. Cardiovascular system: S1 & S2 heard, RRR. No JVD, murmurs, rubs, gallops or clicks. No pedal edema. Gastrointestinal system: Abdomen is nondistended, soft and nontender. No organomegaly or masses felt. Normal bowel sounds heard. Central nervous system: Alert and orientedx2. No focal neurological deficits.  Has global weakness in all 4 extremities.   Data Reviewed: I have personally reviewed following labs and imaging studies  CBC: Recent Labs  Lab 12/19/21 2110 12/20/21 0420 12/22/21 0640 12/22/21 0737  WBC 10.9* 8.8 10.0 10.0  NEUTROABS 6.5  --   --  6.3  HGB  8.8* 8.1* 8.5* 8.6*  HCT 25.3* 24.2* 24.7* 25.1*  MCV 93.7 95.3 95.4 95.8  PLT 192 171 163 174   Basic Metabolic Panel: Recent Labs  Lab 12/18/21 0339 12/19/21 2110 12/20/21 0420 12/21/21 0439 12/22/21 0640 12/22/21 0737  NA 138 142 144 147* 148*  --   K 4.2 4.5 4.5 4.7 5.0  --   CL 112* 113* 115* 116* 118*  --   CO2 21* '22 23 23 25  '$ --   GLUCOSE 100* 111* 140* 160* 163*  --   BUN 59* 64* 63* 58* 56*  --   CREATININE 1.74* 1.30* 1.20* 1.11* 1.03*  --   CALCIUM 8.2* 8.7* 8.8* 9.1 9.1  --   MG  --   --   --   --   --  1.9  PHOS  --   --   --   --   --  2.7   GFR: Estimated Creatinine Clearance: 45.2 mL/min (A) (by C-G formula based on SCr of 1.03 mg/dL (H)). Liver Function Tests: Recent Labs  Lab 12/22/21 0640  AST 22  ALT 15  ALKPHOS 139*   BILITOT 0.5  PROT 5.2*  ALBUMIN 1.8*   No results for input(s): "LIPASE", "AMYLASE" in the last 168 hours. No results for input(s): "AMMONIA" in the last 168 hours. Coagulation Profile: No results for input(s): "INR", "PROTIME" in the last 168 hours. Cardiac Enzymes: No results for input(s): "CKTOTAL", "CKMB", "CKMBINDEX", "TROPONINI" in the last 168 hours. BNP (last 3 results) No results for input(s): "PROBNP" in the last 8760 hours. HbA1C: No results for input(s): "HGBA1C" in the last 72 hours. CBG: Recent Labs  Lab 12/22/21 1657 12/22/21 2130 12/22/21 2344 12/23/21 0351 12/23/21 0857  GLUCAP 170* 175* 155* 156* 163*   Lipid Profile: No results for input(s): "CHOL", "HDL", "LDLCALC", "TRIG", "CHOLHDL", "LDLDIRECT" in the last 72 hours. Thyroid Function Tests: No results for input(s): "TSH", "T4TOTAL", "FREET4", "T3FREE", "THYROIDAB" in the last 72 hours. Anemia Panel: No results for input(s): "VITAMINB12", "FOLATE", "FERRITIN", "TIBC", "IRON", "RETICCTPCT" in the last 72 hours. Sepsis Labs: Recent Labs  Lab 12/17/21 1020 12/18/21 0339 12/19/21 2110  PROCALCITON 2.31 1.62 1.77    Recent Results (from the past 240 hour(s))  Resp Panel by RT-PCR (Flu A&B, Covid) Anterior Nasal Swab     Status: None   Collection Time: 12/14/21  2:08 PM   Specimen: Anterior Nasal Swab  Result Value Ref Range Status   SARS Coronavirus 2 by RT PCR NEGATIVE NEGATIVE Final    Comment: (NOTE) SARS-CoV-2 target nucleic acids are NOT DETECTED.  The SARS-CoV-2 RNA is generally detectable in upper respiratory specimens during the acute phase of infection. The lowest concentration of SARS-CoV-2 viral copies this assay can detect is 138 copies/mL. A negative result does not preclude SARS-Cov-2 infection and should not be used as the sole basis for treatment or other patient management decisions. A negative result may occur with  improper specimen collection/handling, submission of specimen  other than nasopharyngeal swab, presence of viral mutation(s) within the areas targeted by this assay, and inadequate number of viral copies(<138 copies/mL). A negative result must be combined with clinical observations, patient history, and epidemiological information. The expected result is Negative.  Fact Sheet for Patients:  EntrepreneurPulse.com.au  Fact Sheet for Healthcare Providers:  IncredibleEmployment.be  This test is no t yet approved or cleared by the [place] FDA and  has been authorized for detection and/or diagnosis of SARS-CoV-2 by FDA under an Emergency  Use Authorization (EUA). This EUA will remain  in effect (meaning this test can be used) for the duration of the COVID-19 declaration under Section 564(b)(1) of the Act, 21 U.S.C.section 360bbb-3(b)(1), unless the authorization is terminated  or revoked sooner.       Influenza A by PCR NEGATIVE NEGATIVE Final   Influenza B by PCR NEGATIVE NEGATIVE Final    Comment: (NOTE) The Xpert Xpress SARS-CoV-2/FLU/RSV plus assay is intended as an aid in the diagnosis of influenza from Nasopharyngeal swab specimens and should not be used as a sole basis for treatment. Nasal washings and aspirates are unacceptable for Xpert Xpress SARS-CoV-2/FLU/RSV testing.  Fact Sheet for Patients: EntrepreneurPulse.com.au  Fact Sheet for Healthcare Providers: IncredibleEmployment.be  This test is not yet approved or cleared by the [place] FDA and has been authorized for detection and/or diagnosis of SARS-CoV-2 by FDA under an Emergency Use Authorization (EUA). This EUA will remain in effect (meaning this test can be used) for the duration of the COVID-19 declaration under Section 564(b)(1) of the Act, 21 U.S.C. section 360bbb-3(b)(1), unless the authorization is terminated or revoked.  Performed at [facility], [address] [place], [place]  [address]   MRSA Next Gen by PCR, Nasal     Status: None   Collection Time: 12/14/21  9:41 PM   Specimen: Nasal Mucosa; Nasal Swab  Result Value Ref Range Status   MRSA by PCR Next Gen NOT DETECTED NOT DETECTED Final    Comment: (NOTE) The GeneXpert MRSA Assay (FDA approved for NASAL specimens only), is one component of a comprehensive MRSA colonization surveillance program. It is not intended to diagnose MRSA infection nor to guide or monitor treatment for MRSA infections. Test performance is not FDA approved in patients less than 62 years old. Performed at [facility], [place] [address] [place], [place] [address]   Culture, blood (Routine X 2) w Reflex to ID Panel     Status: None   Collection Time: 12/15/21  1:26 AM   Specimen: BLOOD RIGHT HAND  Result Value Ref Range Status   Specimen Description BLOOD RIGHT HAND  Final   Special Requests   Final    BOTTLES DRAWN AEROBIC AND ANAEROBIC Blood Culture adequate volume   Culture   Final    NO GROWTH 5 DAYS Performed at [facility], [place] [address] [place], [place] [address]    Report Status 12/20/2021 FINAL  Final  Culture, blood (Routine X 2) w Reflex to ID Panel     Status: None   Collection Time: 12/15/21  1:40 AM   Specimen: BLOOD LEFT HAND  Result Value Ref Range Status   Specimen Description BLOOD LEFT HAND  Final   Special Requests   Final    BOTTLES DRAWN AEROBIC AND ANAEROBIC Blood Culture results may not be optimal due to an inadequate volume of blood received in culture bottles   Culture   Final    NO GROWTH 5 DAYS Performed at [facility], [place] [address], [place],  [address]    Report Status 12/20/2021 FINAL  Final  Culture, Respiratory w Gram Stain     Status: None   Collection Time: 12/16/21  8:46 AM   Specimen: Tracheal Aspirate; Respiratory  Result Value Ref Range Status   Specimen Description TRACHEAL ASPIRATE  Final   Special Requests NONE  Final   Gram Stain   Final    ABUNDANT  WBC PRESENT,BOTH PMN AND MONONUCLEAR MODERATE GRAM  NEGATIVE RODS Performed at [facility], [place] [address] [place], [place] [address]    Culture FEW PSEUDOMONAS AERUGINOSA  Final   Report Status 12/19/2021 FINAL  Final   Organism ID, Bacteria PSEUDOMONAS AERUGINOSA  Final      Susceptibility   Pseudomonas aeruginosa - MIC*    CEFTAZIDIME 4 SENSITIVE Sensitive     CIPROFLOXACIN <=0.25 SENSITIVE Sensitive     GENTAMICIN <=1 SENSITIVE Sensitive     IMIPENEM 1 SENSITIVE Sensitive     PIP/TAZO <=4 SENSITIVE Sensitive     CEFEPIME 2 SENSITIVE Sensitive     * FEW PSEUDOMONAS AERUGINOSA     Radiology Studies: DG CHEST PORT 1 VIEW  Result Date: 12/22/2021 CLINICAL DATA:  Pneumonia. EXAM: PORTABLE CHEST 1 VIEW COMPARISON:  December 16, 2021 FINDINGS: Feeding catheter collimated off the image. Right PICC line tip at the expected location of the cavoatrial junction. Interval extubation. Normal cardiac silhouette. No evidence of lobar consolidation, pleural effusion or pneumothorax. IMPRESSION: 1. No evidence of acute cardiopulmonary disease. 2. Interval extubation. Electronically Signed   By: Fidela Salisbury M.D.   On: 12/22/2021 17:51    Scheduled Meds:  Chlorhexidine Gluconate Cloth  6 each Topical Daily   docusate  100 mg Per Tube BID   feeding supplement (PROSource TF20)  60 mL Per Tube Daily   free water  200 mL Per Tube Q6H   heparin injection (subcutaneous)  5,000 Units Subcutaneous Q8H   insulin aspart  0-15 Units Subcutaneous Q4H   insulin detemir  5 Units Subcutaneous Daily   mouth rinse  15 mL Mouth Rinse 4 times per day   pantoprazole  40 mg Per Tube Daily   polyethylene glycol  17 g Per Tube Daily   sodium chloride flush  10-40 mL Intracatheter Q12H   Continuous Infusions:  sodium chloride 250 mL (12/18/21 1817)   ceFEPime (MAXIPIME) IV Stopped (12/22/21 1203)   feeding supplement (JEVITY 1.2 CAL) 50 mL/hr at 12/23/21 0824   levETIRAcetam 500 mg (12/23/21 1015)      LOS: 9 days   Darliss Cheney, MD [facility]  12/23/2021, 10:44 AM   *Please note that this is a verbal dictation therefore any spelling or grammatical errors are due to the "[employer] [employer]" system interpretation.  Please page via [place] and do not message via secure chat for urgent patient care matters. Secure chat can be used for non urgent patient care matters.  How to contact the [facility] [place] Attending or Consulting provider [place] or covering provider during after hours [place], for this patient?  Check the care team in [facility] and look for a) attending/consulting [facility] provider listed and b) the [facility] team listed. Page or secure chat [phone]. Log into [email or web address] and use [place] universal password to access. If you do not have the password, please contact the hospital operator. Locate the [facility] provider you are looking for under [facility] and page to a number that you can be directly reached. If you still have difficulty reaching the provider, please page the [facility] (Director on Call) for the Hospitalists listed on amion for assistance.

## 2021-12-24 DIAGNOSIS — R569 Unspecified convulsions: Secondary | ICD-10-CM | POA: Diagnosis not present

## 2021-12-24 LAB — BASIC METABOLIC PANEL
Anion gap: 7 (ref 5–15)
Anion gap: 4 — ABNORMAL LOW (ref 5–15)
BUN: 53 mg/dL — ABNORMAL HIGH (ref 8–23)
BUN: 52 mg/dL — ABNORMAL HIGH (ref 8–23)
CO2: 22 mmol/L (ref 22–32)
CO2: 22 mmol/L (ref 22–32)
Calcium: 9.5 mg/dL (ref 8.9–10.3)
Calcium: 9.1 mg/dL (ref 8.9–10.3)
Chloride: 119 mmol/L — ABNORMAL HIGH (ref 98–111)
Chloride: 121 mmol/L — ABNORMAL HIGH (ref 98–111)
Creatinine, Ser: 1.21 mg/dL — ABNORMAL HIGH (ref 0.44–1.00)
Creatinine, Ser: 1.1 mg/dL — ABNORMAL HIGH (ref 0.44–1.00)
GFR, Estimated: 47 mL/min — ABNORMAL LOW (ref >60)
GFR, Estimated: 52 mL/min — ABNORMAL LOW (ref >60)
Glucose, Bld: 209 mg/dL — ABNORMAL HIGH (ref 70–99)
Glucose, Bld: 192 mg/dL — ABNORMAL HIGH (ref 70–99)
Potassium: 5.7 mmol/L — ABNORMAL HIGH (ref 3.5–5.1)
Potassium: 5.5 mmol/L — ABNORMAL HIGH (ref 3.5–5.1)
Sodium: 148 mmol/L — ABNORMAL HIGH (ref 135–145)
Sodium: 147 mmol/L — ABNORMAL HIGH (ref 135–145)

## 2021-12-24 LAB — CBC WITH DIFFERENTIAL/PLATELET
Abs Immature Granulocytes: 0.05 10*3/uL (ref 0.00–0.07)
Basophils Absolute: 0.1 10*3/uL (ref 0.0–0.1)
Basophils Relative: 0 %
Eosinophils Absolute: 0.6 10*3/uL — ABNORMAL HIGH (ref 0.0–0.5)
Eosinophils Relative: 5 %
HCT: 25 % — ABNORMAL LOW (ref 36.0–46.0)
Hemoglobin: 8.4 g/dL — ABNORMAL LOW (ref 12.0–15.0)
Immature Granulocytes: 0 %
Lymphocytes Relative: 19 %
Lymphs Abs: 2.2 10*3/uL (ref 0.7–4.0)
MCH: 32.6 pg (ref 26.0–34.0)
MCHC: 33.6 g/dL (ref 30.0–36.0)
MCV: 96.9 fL (ref 80.0–100.0)
Monocytes Absolute: 1 10*3/uL (ref 0.1–1.0)
Monocytes Relative: 9 %
Neutro Abs: 7.8 10*3/uL — ABNORMAL HIGH (ref 1.7–7.7)
Neutrophils Relative %: 67 %
Platelets: 193 10*3/uL (ref 150–400)
RBC: 2.58 MIL/uL — ABNORMAL LOW (ref 3.87–5.11)
RDW: 13.6 % (ref 11.5–15.5)
WBC: 11.7 10*3/uL — ABNORMAL HIGH (ref 4.0–10.5)
nRBC: 0 % (ref 0.0–0.2)

## 2021-12-24 LAB — GLUCOSE, CAPILLARY
Glucose-Capillary: 117 mg/dL — ABNORMAL HIGH (ref 70–99)
Glucose-Capillary: 131 mg/dL — ABNORMAL HIGH (ref 70–99)
Glucose-Capillary: 136 mg/dL — ABNORMAL HIGH (ref 70–99)
Glucose-Capillary: 137 mg/dL — ABNORMAL HIGH (ref 70–99)
Glucose-Capillary: 158 mg/dL — ABNORMAL HIGH (ref 70–99)
Glucose-Capillary: 161 mg/dL — ABNORMAL HIGH (ref 70–99)
Glucose-Capillary: 193 mg/dL — ABNORMAL HIGH (ref 70–99)

## 2021-12-24 LAB — POTASSIUM
Potassium: 5.7 mmol/L — ABNORMAL HIGH (ref 3.5–5.1)
Potassium: 6 mmol/L — ABNORMAL HIGH (ref 3.5–5.1)

## 2021-12-24 MED ORDER — DEXTROSE 50 % IV SOLN
1.0000 | Freq: Once | INTRAVENOUS | Status: AC
  Administered 2021-12-24: 50 mL via INTRAVENOUS
  Filled 2021-12-24: qty 50

## 2021-12-24 MED ORDER — INSULIN ASPART 100 UNIT/ML IV SOLN
10.0000 [IU] | Freq: Once | INTRAVENOUS | Status: AC
  Administered 2021-12-24: 10 [IU] via INTRAVENOUS

## 2021-12-24 MED ORDER — SODIUM ZIRCONIUM CYCLOSILICATE 10 G PO PACK
10.0000 g | PACK | Freq: Once | ORAL | Status: AC
  Administered 2021-12-24: 10 g

## 2021-12-24 MED ORDER — INSULIN DETEMIR 100 UNIT/ML ~~LOC~~ SOLN
10.0000 [IU] | Freq: Every day | SUBCUTANEOUS | Status: DC
  Administered 2021-12-24 – 2021-12-28 (×5): 10 [IU] via SUBCUTANEOUS
  Filled 2021-12-24 (×6): qty 0.1

## 2021-12-24 MED ORDER — SODIUM ZIRCONIUM CYCLOSILICATE 10 G PO PACK
10.0000 g | PACK | Freq: Once | ORAL | Status: DC
  Filled 2021-12-24: qty 1

## 2021-12-24 NOTE — Progress Notes (Addendum)
HOSPITAL MEDICINE OVERNIGHT EVENT NOTE    Notified by nursing that repeat potassium this evening is 6.0, up from 5.7 earlier in the day.  Patient received dextrose and insulin earlier in the day and despite this potassium levels continue to climb.  Repeat EKG reveals no significant widening of the QRS complexes compared to prior.  In the absence of significant EKG change, will treat by administering 10 g of Lokelma now.    Monitoring on telemetry overnight, repeating chemistry in several hours this morning.  Will follow up and address as appropriate.  Elizabeth Emerald  MD [facility]   ADDENDUM (9/26 7am)  Repeat potassium level this AM 5.4.   Continue to monitor on telemetry.  Sherryll Burger Lounell Schumacher

## 2021-12-24 NOTE — Progress Notes (Signed)
PROGRESS NOTE    Elizabeth Crosby  KWI:[id number] DOB: 1946-03-16 DOA: 12/14/2021 PCP: [name], MD   Brief Narrative:  76 year old female with past medical history of CKD stage IV, hypertension, diabetes mellitus type 2, poor dentition, recent discharge from the hospital on 10/09/2021 for staph  lugdunensis  bacteremia was brought to hospital for acute encephalopathy and not following commands.  In route to hospital she was noted to have seizure like activity and was intubated for airway protection, code stroke was called, CT of the head was negative and was felt to be postictal from seizure, she was loaded with Keppra and transferred to [facility] for long-term EEG.  MRI of the brain showed no acute intracranial normality and T2 hyperintensity. Significant Events: 9/15 admitted with working diagnosis of seizure.  Seized in route to the emergency room and was unable to protect airway on arrival.  Intubated.  Code stroke called.  CTH negative for stroke.   Loaded with IV Keppra, transferred to [facility] for MRI of brain and LTM-EEG monitoring. 9/17: MRI completed with areas of chronic siderosis felt possibly secondary to remote hypoxic ischemic event.   9/17 Echocardiogram with EF 70 to 75% no regional wall abnormality grade 1 diastolic dysfunction RV systolic function normal left atrial size mildly dilated agitated saline contrast bubble negative 9/18:  Extubated.  Very awake postextubation.  Concern for need for reintubation, had goals of care discussion.  Patient DO NOT RESUSCITATE, no reintubation. 9/19 remaining weak, transfer to progressive    Assessment & Plan:   Principal Problem:   Seizures ([place]) Active Problems:   Leukocytosis   GERD (gastroesophageal reflux disease)   Morbid obesity (HCC)   DM2 (diabetes mellitus, type 2) (HCC)   Acute metabolic encephalopathy   Acute renal failure superimposed on stage 4 chronic kidney disease (HCC)   History of endometrial cancer    Anemia   Protein-calorie malnutrition, severe (HCC)   Severe muscle deconditioning   DNR (do not resuscitate)   Pseudomonas pneumonia ([place])   Obesity (BMI 30-39.9)  New onset seizures:  -No clear etiology, MRI brain showed no acute finding, but showed remote history of what looks like prior hypoxic insult -Neurology was consulted, patient given Keppra; continue Keppra twice a day -Patient will need skilled nursing facility -Neurology to follow-up as outpatient   Acute metabolic encephalopathy: -Continues to be lethargic but oriented x2-3 when she communicates.  Hyperkalemia: 5.7.  We will give her NovoLog 10 units IV along with dextrose.  Recheck later.  Monitor on telemetry.   Acute respiratory failure with hypoxemia/Pseudomonas pneumonia -Patient was intubated to protect the airway; s/p extubation -Patient is DNR/DNI -Patient is currently n.p.o. as per speech therapy recommendations -Might need swallow evaluation next week if failed may be a candidate for PEG tube -continue medications through NG tube -Has been weaned off to room air -On cefepime currently, completed 10 days course of antibiotics/cefepime on 12/23/2021.   Nutrition -NG tube feedings, Core track placed 12/18/2021. -Speech therapy saw patient recommended to be n.p.o. SLP on board, she needs to be reevaluated again, may be a candidate for PEG tube if no improvement in mentation and swallowing.  Discussed with son over the phone on 12/23/2021, he was not sure if the patient would want PEG tube.  He said he is going to start discussing with her daughter about that decision.  Hypernatremia: -Was started on  free water per tube every 12 hours; changed to every 6 hours sodium 147 on 12/24/2021.  Acute kidney injury on CKD stage IIIa -Baseline creatinine 1.1-1.2.  Creatinine now back to baseline.   Hypotension -Resolved, patient was started on midodrine which is currently on hold -Will monitor blood pressure closely    Hypertension -Antihypertensive medication on hold due to low blood pressure   Diabetes mellitus type 2 -Continue sliding scale insulin NovoLog, insulin detemir -CBG well controlled   GERD (gastroesophageal reflux disease) Continue PPI.   Normocytic anemia: -Hemoglobin is 8.6 today Follow CBC in a.m.  DVT prophylaxis: heparin injection 5,000 Units Start: 12/15/21 1445 Place and maintain sequential compression device Start: 12/14/21 2210   Code Status: DNR  Family Communication:  None present at bedside.  D/w son over phone yesterday, no new update today.  Status is: Inpatient Remains inpatient appropriate because: Waiting for further evaluation from SLP   Estimated body mass index is 33.32 kg/m as calculated from the following:   Height as of this encounter: '5\' 1"'$  (1.549 m).   Weight as of this encounter: 80 kg.    Nutritional Assessment: Body mass index is 33.32 kg/m.Marland Kitchen Seen by dietician.  I agree with the assessment and plan as outlined below: Nutrition Status: Nutrition Problem: Inadequate oral intake Etiology: acute illness Signs/Symptoms: NPO status Interventions: Tube feeding  . Skin Assessment: I have examined the patient's skin and I agree with the wound assessment as performed by the wound care RN as outlined below:    Consultants:  Neurology-signed off  Procedures:  None  Antimicrobials:  Anti-infectives (From admission, onward)    Start     Dose/Rate Route Frequency Ordered Stop   12/17/21 1300  vancomycin (VANCOREADY) IVPB 500 mg/100 mL  Status:  Discontinued        500 mg 100 mL/hr over 60 Minutes Intravenous Every 24 hours 12/16/21 1059 12/17/21 0928   12/16/21 1145  vancomycin (VANCOCIN) IVPB 1000 mg/200 mL premix        1,000 mg 200 mL/hr over 60 Minutes Intravenous  Once 12/16/21 1059 12/16/21 1307   12/16/21 1145  ceFEPIme (MAXIPIME) 2 g in sodium chloride 0.9 % 100 mL IVPB        2 g 200 mL/hr over 30 Minutes Intravenous Every 24 hours  12/16/21 1059 12/23/21 1310         Subjective:  Patient seen and examined.  Remains lethargic but easily arousable and communicates well when awake and she is oriented with no complaints.  Objective: Vitals:   12/24/21 0345 12/24/21 0544 12/24/21 0744 12/24/21 0844  BP: (!) 130/48 (!) 165/61 129/61 (!) 137/52  Pulse:    73  Resp: '18 15 19 16  '$ Temp: 98.6 F (37 C)     TempSrc: Oral     SpO2:    99%  Weight:      Height:        Intake/Output Summary (Last 24 hours) at 12/24/2021 1151 Last data filed at 12/24/2021 1112 Gross per 24 hour  Intake 3015.44 ml  Output 1050 ml  Net 1965.44 ml    Filed Weights   12/20/21 0408 12/21/21 0500 12/22/21 0500  Weight: 87.7 kg 88.1 kg 80 kg    Examination:  General exam: Appears calm and comfortable  Respiratory system: Clear to auscultation. Respiratory effort normal. Cardiovascular system: S1 & S2 heard, RRR. No JVD, murmurs, rubs, gallops or clicks. No pedal edema. Gastrointestinal system: Abdomen is nondistended, soft and nontender. No organomegaly or masses felt. Normal bowel sounds heard. Central nervous system: Alert and oriented. No focal neurological deficits.  Has global equal weakness in all 4 extremities. Extremities: Symmetric 5 x 5 power. Skin: No rashes, lesions or ulcers.   Data Reviewed: I have personally reviewed following labs and imaging studies  CBC: Recent Labs  Lab 12/19/21 2110 12/20/21 0420 12/22/21 0640 12/22/21 0737 12/24/21 0606  WBC 10.9* 8.8 10.0 10.0 11.7*  NEUTROABS 6.5  --   --  6.3 7.8*  HGB 8.8* 8.1* 8.5* 8.6* 8.4*  HCT 25.3* 24.2* 24.7* 25.1* 25.0*  MCV 93.7 95.3 95.4 95.8 96.9  PLT 192 171 163 168 638    Basic Metabolic Panel: Recent Labs  Lab 12/20/21 0420 12/21/21 0439 12/22/21 0640 12/22/21 0737 12/24/21 0606 12/24/21 1036  NA 144 147* 148*  --  148* 147*  K 4.5 4.7 5.0  --  5.7* 5.5*  CL 115* 116* 118*  --  119* 121*  CO2 '23 23 25  '$ --  22 22  GLUCOSE 140* 160* 163*   --  209* 192*  BUN 63* 58* 56*  --  53* 52*  CREATININE 1.20* 1.11* 1.03*  --  1.21* 1.10*  CALCIUM 8.8* 9.1 9.1  --  9.5 9.1  MG  --   --   --  1.9  --   --   PHOS  --   --   --  2.7  --   --     GFR: Estimated Creatinine Clearance: 42.3 mL/min (A) (by C-G formula based on SCr of 1.1 mg/dL (H)). Liver Function Tests: Recent Labs  Lab 12/22/21 0640  AST 22  ALT 15  ALKPHOS 139*  BILITOT 0.5  PROT 5.2*  ALBUMIN 1.8*    No results for input(s): "LIPASE", "AMYLASE" in the last 168 hours. No results for input(s): "AMMONIA" in the last 168 hours. Coagulation Profile: No results for input(s): "INR", "PROTIME" in the last 168 hours. Cardiac Enzymes: No results for input(s): "CKTOTAL", "CKMB", "CKMBINDEX", "TROPONINI" in the last 168 hours. BNP (last 3 results) No results for input(s): "PROBNP" in the last 8760 hours. HbA1C: No results for input(s): "HGBA1C" in the last 72 hours. CBG: Recent Labs  Lab 12/23/21 1646 12/23/21 1946 12/24/21 0042 12/24/21 0318 12/24/21 0830  GLUCAP 149* 145* 158* 193* 161*    Lipid Profile: No results for input(s): "CHOL", "HDL", "LDLCALC", "TRIG", "CHOLHDL", "LDLDIRECT" in the last 72 hours. Thyroid Function Tests: No results for input(s): "TSH", "T4TOTAL", "FREET4", "T3FREE", "THYROIDAB" in the last 72 hours. Anemia Panel: No results for input(s): "VITAMINB12", "FOLATE", "FERRITIN", "TIBC", "IRON", "RETICCTPCT" in the last 72 hours. Sepsis Labs: Recent Labs  Lab 12/18/21 0339 12/19/21 2110  PROCALCITON 1.62 1.77     Recent Results (from the past 240 hour(s))  Resp Panel by RT-PCR (Flu A&B, Covid) Anterior Nasal Swab     Status: None   Collection Time: 12/14/21  2:08 PM   Specimen: Anterior Nasal Swab  Result Value Ref Range Status   SARS Coronavirus 2 by RT PCR NEGATIVE NEGATIVE Final    Comment: (NOTE) SARS-CoV-2 target nucleic acids are NOT DETECTED.  The SARS-CoV-2 RNA is generally detectable in upper respiratory specimens  during the acute phase of infection. The lowest concentration of SARS-CoV-2 viral copies this assay can detect is 138 copies/mL. A negative result does not preclude SARS-Cov-2 infection and should not be used as the sole basis for treatment or other patient management decisions. A negative result may occur with  improper specimen collection/handling, submission of specimen other than nasopharyngeal swab, presence of viral mutation(s) within the areas targeted  by this assay, and inadequate number of viral copies(<138 copies/mL). A negative result must be combined with clinical observations, patient history, and epidemiological information. The expected result is Negative.  Fact Sheet for Patients:  EntrepreneurPulse.com.au  Fact Sheet for Healthcare Providers:  IncredibleEmployment.be  This test is no t yet approved or cleared by the [place] FDA and  has been authorized for detection and/or diagnosis of SARS-CoV-2 by FDA under an Emergency Use Authorization (EUA). This EUA will remain  in effect (meaning this test can be used) for the duration of the COVID-19 declaration under Section 564(b)(1) of the Act, 21 U.S.C.section 360bbb-3(b)(1), unless the authorization is terminated  or revoked sooner.       Influenza A by PCR NEGATIVE NEGATIVE Final   Influenza B by PCR NEGATIVE NEGATIVE Final    Comment: (NOTE) The Xpert Xpress SARS-CoV-2/FLU/RSV plus assay is intended as an aid in the diagnosis of influenza from Nasopharyngeal swab specimens and should not be used as a sole basis for treatment. Nasal washings and aspirates are unacceptable for Xpert Xpress SARS-CoV-2/FLU/RSV testing.  Fact Sheet for Patients: EntrepreneurPulse.com.au  Fact Sheet for Healthcare Providers: IncredibleEmployment.be  This test is not yet approved or cleared by the [place] FDA and has been authorized for detection  and/or diagnosis of SARS-CoV-2 by FDA under an Emergency Use Authorization (EUA). This EUA will remain in effect (meaning this test can be used) for the duration of the COVID-19 declaration under Section 564(b)(1) of the Act, 21 U.S.C. section 360bbb-3(b)(1), unless the authorization is terminated or revoked.  Performed at [facility], [address] [place], [place] [address]   MRSA Next Gen by PCR, Nasal     Status: None   Collection Time: 12/14/21  9:41 PM   Specimen: Nasal Mucosa; Nasal Swab  Result Value Ref Range Status   MRSA by PCR Next Gen NOT DETECTED NOT DETECTED Final    Comment: (NOTE) The GeneXpert MRSA Assay (FDA approved for NASAL specimens only), is one component of a comprehensive MRSA colonization surveillance program. It is not intended to diagnose MRSA infection nor to guide or monitor treatment for MRSA infections. Test performance is not FDA approved in patients less than 5 years old. Performed at [facility], [place] [address] [place], [place] [address]   Culture, blood (Routine X 2) w Reflex to ID Panel     Status: None   Collection Time: 12/15/21  1:26 AM   Specimen: BLOOD RIGHT HAND  Result Value Ref Range Status   Specimen Description BLOOD RIGHT HAND  Final   Special Requests   Final    BOTTLES DRAWN AEROBIC AND ANAEROBIC Blood Culture adequate volume   Culture   Final    NO GROWTH 5 DAYS Performed at [facility], [place] [address], [place], [place] [address]    Report Status 12/20/2021 FINAL  Final  Culture, blood (Routine X 2) w Reflex to ID Panel     Status: None   Collection Time: 12/15/21  1:40 AM   Specimen: BLOOD LEFT HAND  Result Value Ref Range Status   Specimen Description BLOOD LEFT HAND  Final   Special Requests   Final    BOTTLES DRAWN AEROBIC AND ANAEROBIC Blood Culture results may not be optimal due to an inadequate volume of blood received in culture bottles   Culture   Final    NO GROWTH 5 DAYS Performed at [facility], [place] [address] [place], [place]  [address]    Report Status 12/20/2021 FINAL  Final  Culture, Respiratory w Gram Stain     Status: None   Collection Time: 12/16/21  8:46 AM   Specimen: Tracheal Aspirate; Respiratory  Result Value Ref Range Status   Specimen Description TRACHEAL ASPIRATE  Final   Special Requests NONE  Final   Gram Stain   Final    ABUNDANT WBC PRESENT,BOTH PMN AND MONONUCLEAR MODERATE GRAM NEGATIVE RODS Performed at [facility], [place] [address] [place], [place] [address]    Culture FEW PSEUDOMONAS AERUGINOSA  Final   Report Status 12/19/2021 FINAL  Final   Organism ID, Bacteria PSEUDOMONAS AERUGINOSA  Final      Susceptibility   Pseudomonas aeruginosa - MIC*    CEFTAZIDIME 4 SENSITIVE Sensitive     CIPROFLOXACIN <=0.25 SENSITIVE Sensitive     GENTAMICIN <=1 SENSITIVE Sensitive     IMIPENEM 1 SENSITIVE Sensitive     PIP/TAZO <=4 SENSITIVE Sensitive     CEFEPIME 2 SENSITIVE Sensitive     * FEW PSEUDOMONAS AERUGINOSA     Radiology Studies: DG CHEST PORT 1 VIEW  Result Date: 12/22/2021 CLINICAL DATA:  Pneumonia. EXAM: PORTABLE CHEST 1 VIEW COMPARISON:  December 16, 2021 FINDINGS: Feeding catheter collimated off the image. Right PICC line tip at the expected location of the cavoatrial junction. Interval extubation. Normal cardiac silhouette. No evidence of lobar consolidation, pleural effusion or pneumothorax. IMPRESSION: 1. No evidence of acute cardiopulmonary disease. 2. Interval extubation. Electronically Signed   By: Fidela Salisbury M.D.   On: 12/22/2021 17:51    Scheduled Meds:  Chlorhexidine Gluconate Cloth  6 each Topical Daily   docusate  100 mg Per Tube BID   feeding supplement (PROSource TF20)  60 mL Per Tube Daily   free water  200 mL Per Tube Q6H   heparin injection (subcutaneous)  5,000 Units Subcutaneous Q8H   insulin aspart  0-15 Units Subcutaneous Q4H   insulin detemir  10 Units Subcutaneous Daily   mouth rinse  15 mL  Mouth Rinse 4 times per day   pantoprazole  40 mg Per Tube Daily   polyethylene glycol  17 g Per Tube Daily   sodium chloride flush  10-40 mL Intracatheter Q12H   Continuous Infusions:  sodium chloride 250 mL (12/18/21 1817)   feeding supplement (JEVITY 1.2 CAL) 50 mL/hr at 12/24/21 1112   levETIRAcetam 400 mL/hr at 12/24/21 1112     LOS: 10 days   Darliss Cheney, MD [facility]  12/24/2021, 11:51 AM   *Please note that this is a verbal dictation therefore any spelling or grammatical errors are due to the "Landa One" system interpretation.  Please page via [place] and do not message via secure chat for urgent patient care matters. Secure chat can be used for non urgent patient care matters.  How to contact the [facility] Attending or Consulting provider [place] or covering provider during after hours [employer], for this patient?  Check the care team in [facility] and look for a) attending/consulting [facility] provider listed and b) the [facility] team listed. Page or secure chat [phone]. Log into [email or web address] and use [place]'s universal password to access. If you do not have the password, please contact the hospital operator. Locate the [facility] provider you are looking for under [facility] and page to a number that you can be directly reached. If you still have difficulty reaching the provider, please page the [facility] (Director on Call) for the  Hospitalists listed on amion for assistance.

## 2021-12-25 DIAGNOSIS — R569 Unspecified convulsions: Secondary | ICD-10-CM | POA: Diagnosis not present

## 2021-12-25 LAB — GLUCOSE, CAPILLARY
Glucose-Capillary: 129 mg/dL — ABNORMAL HIGH (ref 70–99)
Glucose-Capillary: 138 mg/dL — ABNORMAL HIGH (ref 70–99)
Glucose-Capillary: 201 mg/dL — ABNORMAL HIGH (ref 70–99)
Glucose-Capillary: 114 mg/dL — ABNORMAL HIGH (ref 70–99)
Glucose-Capillary: 133 mg/dL — ABNORMAL HIGH (ref 70–99)

## 2021-12-25 LAB — POTASSIUM: Potassium: 5.1 mmol/L (ref 3.5–5.1)

## 2021-12-25 LAB — BASIC METABOLIC PANEL
Anion gap: 7 (ref 5–15)
BUN: 52 mg/dL — ABNORMAL HIGH (ref 8–23)
CO2: 18 mmol/L — ABNORMAL LOW (ref 22–32)
Calcium: 8.8 mg/dL — ABNORMAL LOW (ref 8.9–10.3)
Chloride: 122 mmol/L — ABNORMAL HIGH (ref 98–111)
Creatinine, Ser: 1.04 mg/dL — ABNORMAL HIGH (ref 0.44–1.00)
GFR, Estimated: 56 mL/min — ABNORMAL LOW (ref >60)
Glucose, Bld: 137 mg/dL — ABNORMAL HIGH (ref 70–99)
Potassium: 5.4 mmol/L — ABNORMAL HIGH (ref 3.5–5.1)
Sodium: 147 mmol/L — ABNORMAL HIGH (ref 135–145)

## 2021-12-25 MED ORDER — SODIUM ZIRCONIUM CYCLOSILICATE 10 G PO PACK
10.0000 g | PACK | Freq: Once | ORAL | Status: AC
  Administered 2021-12-25: 10 g via ORAL
  Filled 2021-12-25: qty 1

## 2021-12-25 MED ORDER — ACETAMINOPHEN 160 MG/5ML PO SOLN
650.0000 mg | Freq: Four times a day (QID) | ORAL | Status: DC | PRN
  Administered 2021-12-25 – 2021-12-27 (×3): 650 mg
  Filled 2021-12-25 (×3): qty 20.3

## 2021-12-25 NOTE — Progress Notes (Signed)
PROGRESS NOTE    Elizabeth Crosby  XNA:[id number] DOB: Jun 28, 1945 DOA: 12/14/2021 PCP: [name], MD   Brief Narrative:  76 year old female with past medical history of CKD stage IV, hypertension, diabetes mellitus type 2, poor dentition, recent discharge from the hospital on 10/09/2021 for staph  lugdunensis  bacteremia was brought to hospital for acute encephalopathy and not following commands.  In route to hospital she was noted to have seizure like activity and was intubated for airway protection, code stroke was called, CT of the head was negative and was felt to be postictal from seizure, she was loaded with Keppra and transferred to [facility] for long-term EEG.  MRI of the brain showed no acute intracranial normality and T2 hyperintensity. Significant Events: 9/15 admitted with working diagnosis of seizure.  Seized in route to the emergency room and was unable to protect airway on arrival.  Intubated.  Code stroke called.  CTH negative for stroke.   Loaded with IV Keppra, transferred to [facility] for MRI of brain and LTM-EEG monitoring. 9/17: MRI completed with areas of chronic siderosis felt possibly secondary to remote hypoxic ischemic event.   9/17 Echocardiogram with EF 70 to 75% no regional wall abnormality grade 1 diastolic dysfunction RV systolic function normal left atrial size mildly dilated agitated saline contrast bubble negative 9/18:  Extubated.  Very awake postextubation.  Concern for need for reintubation, had goals of care discussion.  Patient DO NOT RESUSCITATE, no reintubation. 9/19 remaining weak, transfer to progressive    Assessment & Plan:   Principal Problem:   Seizures ([place]) Active Problems:   Leukocytosis   GERD (gastroesophageal reflux disease)   Morbid obesity (HCC)   DM2 (diabetes mellitus, type 2) (HCC)   Acute metabolic encephalopathy   Acute renal failure superimposed on stage 4 chronic kidney disease (HCC)   History of endometrial cancer    Anemia   Protein-calorie malnutrition, severe (HCC)   Severe muscle deconditioning   DNR (do not resuscitate)   Pseudomonas pneumonia ([place])   Obesity (BMI 30-39.9)  New onset seizures:  -No clear etiology, MRI brain showed no acute finding, but showed remote history of what looks like prior hypoxic insult -Neurology was consulted, patient given Keppra; continue Keppra twice a day -Patient will need skilled nursing facility -Neurology to follow-up as outpatient   Acute metabolic encephalopathy: -Continues to be lethargic but oriented x2-3 when she communicates.  Hyperkalemia: Still hyperkalemic with potassium of 5.4 even after receiving Lokelma as well as dextrose and insulin yesterday.  We will give her another dose of Lokelma and repeat potassium later.  Monitor on telemetry.   Acute respiratory failure with hypoxemia/Pseudomonas pneumonia -Patient was intubated to protect the airway; s/p extubation -Patient is DNR/DNI -Patient is currently n.p.o. as per speech therapy recommendations -Might need swallow evaluation next week if failed may be a candidate for PEG tube -continue medications through NG tube -Has been weaned off to room air -On cefepime currently, completed 10 days course of antibiotics/cefepime on 12/23/2021.   Nutrition -NG tube feedings, Core track placed 12/18/2021. -Speech therapy saw patient recommended to be n.p.o. SLP on board, reassessed again, may be a candidate for PEG tube if no improvement in mentation and swallowing.  SLP recommends NPO except for small bites of purees from the floor stock and wants to continue tube feeds in the meantime.  Discussed with son over the phone on 12/23/2021, he was not sure if the patient would want PEG tube.  He said he  is going to start discussing with her daughter about that decision.  Hypernatremia: -Was started on  free water per tube every 12 hours; changed to every 6 hours sodium 147 on 12/24/2021.   Acute kidney injury on  CKD stage IIIa -Baseline creatinine 1.1-1.2.  Creatinine now back to baseline.   Hypotension -Resolved, patient was started on midodrine which is currently on hold -Will monitor blood pressure closely   Hypertension -Antihypertensive medication on hold due to low blood pressure   Diabetes mellitus type 2: Due to hyperglycemia started on Semglee 10 units on 12/24/2021 and continues to be on SSI.  Blood sugar better.  Will monitor closely.   GERD (gastroesophageal reflux disease) Continue PPI.   Normocytic anemia: -Hemoglobin is 8.6 today Follow CBC in a.m.  GOC: Patient carries poor prognosis.  I have consulted palliative care for help.  DVT prophylaxis: heparin injection 5,000 Units Start: 12/15/21 1445 Place and maintain sequential compression device Start: 12/14/21 2210   Code Status: DNR  Family Communication:  None present at bedside.  D/w son over phone yesterday, no new update today.  Status is: Inpatient Remains inpatient appropriate because: Pending placement and further assessment by SLP.   Estimated body mass index is 34.78 kg/m as calculated from the following:   Height as of this encounter: '5\' 1"'$  (1.549 m).   Weight as of this encounter: 83.5 kg.    Nutritional Assessment: Body mass index is 34.78 kg/m.Marland Kitchen Seen by dietician.  I agree with the assessment and plan as outlined below: Nutrition Status: Nutrition Problem: Inadequate oral intake Etiology: acute illness Signs/Symptoms: NPO status Interventions: Tube feeding  . Skin Assessment: I have examined the patient's skin and I agree with the wound assessment as performed by the wound care RN as outlined below:    Consultants:  Neurology-signed off  Procedures:  None  Antimicrobials:  Anti-infectives (From admission, onward)    Start     Dose/Rate Route Frequency Ordered Stop   12/17/21 1300  vancomycin (VANCOREADY) IVPB 500 mg/100 mL  Status:  Discontinued        500 mg 100 mL/hr over 60 Minutes  Intravenous Every 24 hours 12/16/21 1059 12/17/21 0928   12/16/21 1145  vancomycin (VANCOCIN) IVPB 1000 mg/200 mL premix        1,000 mg 200 mL/hr over 60 Minutes Intravenous  Once 12/16/21 1059 12/16/21 1307   12/16/21 1145  ceFEPIme (MAXIPIME) 2 g in sodium chloride 0.9 % 100 mL IVPB        2 g 200 mL/hr over 30 Minutes Intravenous Every 24 hours 12/16/21 1059 12/23/21 1310         Subjective:  Patient's and examined.  Remains lethargic as usual but easily arousable and when awake, she is oriented.  She has no complaints.  Objective: Vitals:   12/25/21 0400 12/25/21 0434 12/25/21 0726 12/25/21 1124  BP: (!) 126/51  (!) 133/51 (!) 133/54  Pulse: 70   79  Resp: '15  18 19  '$ Temp: 98 F (36.7 C)  97.8 F (36.6 C) 98.1 F (36.7 C)  TempSrc: Axillary  Oral Axillary  SpO2: 100%   100%  Weight:  83.5 kg    Height:        Intake/Output Summary (Last 24 hours) at 12/25/2021 1435 Last data filed at 12/25/2021 0400 Gross per 24 hour  Intake 1350 ml  Output 100 ml  Net 1250 ml    Filed Weights   12/21/21 0500 12/22/21 0500 12/25/21 0434  Weight: 88.1 kg 80 kg 83.5 kg    Examination:  General exam: Appears calm and comfortable but lethargic Respiratory system: Clear to auscultation. Respiratory effort normal. Cardiovascular system: S1 & S2 heard, RRR. No JVD, murmurs, rubs, gallops or clicks. No pedal edema. Gastrointestinal system: Abdomen is nondistended, soft and nontender. No organomegaly or masses felt. Normal bowel sounds heard. Central nervous system: lethargic but oriented.  No focal neurological deficits. Extremities: Symmetric 5 x 5 power.   Data Reviewed: I have personally reviewed following labs and imaging studies  CBC: Recent Labs  Lab 12/19/21 2110 12/20/21 0420 12/22/21 0640 12/22/21 0737 12/24/21 0606  WBC 10.9* 8.8 10.0 10.0 11.7*  NEUTROABS 6.5  --   --  6.3 7.8*  HGB 8.8* 8.1* 8.5* 8.6* 8.4*  HCT 25.3* 24.2* 24.7* 25.1* 25.0*  MCV 93.7 95.3  95.4 95.8 96.9  PLT 192 171 163 168 093    Basic Metabolic Panel: Recent Labs  Lab 12/21/21 0439 12/22/21 0640 12/22/21 0737 12/24/21 0606 12/24/21 1036 12/24/21 1528 12/24/21 1837 12/25/21 0444  NA 147* 148*  --  148* 147*  --   --  147*  K 4.7 5.0  --  5.7* 5.5* 5.7* 6.0* 5.4*  CL 116* 118*  --  119* 121*  --   --  122*  CO2 23 25  --  22 22  --   --  18*  GLUCOSE 160* 163*  --  209* 192*  --   --  137*  BUN 58* 56*  --  53* 52*  --   --  52*  CREATININE 1.11* 1.03*  --  1.21* 1.10*  --   --  1.04*  CALCIUM 9.1 9.1  --  9.5 9.1  --   --  8.8*  MG  --   --  1.9  --   --   --   --   --   PHOS  --   --  2.7  --   --   --   --   --     GFR: Estimated Creatinine Clearance: 45.8 mL/min (A) (by C-G formula based on SCr of 1.04 mg/dL (H)). Liver Function Tests: Recent Labs  Lab 12/22/21 0640  AST 22  ALT 15  ALKPHOS 139*  BILITOT 0.5  PROT 5.2*  ALBUMIN 1.8*    No results for input(s): "LIPASE", "AMYLASE" in the last 168 hours. No results for input(s): "AMMONIA" in the last 168 hours. Coagulation Profile: No results for input(s): "INR", "PROTIME" in the last 168 hours. Cardiac Enzymes: No results for input(s): "CKTOTAL", "CKMB", "CKMBINDEX", "TROPONINI" in the last 168 hours. BNP (last 3 results) No results for input(s): "PROBNP" in the last 8760 hours. HbA1C: No results for input(s): "HGBA1C" in the last 72 hours. CBG: Recent Labs  Lab 12/24/21 2022 12/24/21 2354 12/25/21 0421 12/25/21 0816 12/25/21 1215  GLUCAP 137* 136* 129* 138* 201*    Lipid Profile: No results for input(s): "CHOL", "HDL", "LDLCALC", "TRIG", "CHOLHDL", "LDLDIRECT" in the last 72 hours. Thyroid Function Tests: No results for input(s): "TSH", "T4TOTAL", "FREET4", "T3FREE", "THYROIDAB" in the last 72 hours. Anemia Panel: No results for input(s): "VITAMINB12", "FOLATE", "FERRITIN", "TIBC", "IRON", "RETICCTPCT" in the last 72 hours. Sepsis Labs: Recent Labs  Lab 12/19/21 2110   PROCALCITON 1.77     Recent Results (from the past 240 hour(s))  Culture, Respiratory w Gram Stain     Status: None   Collection Time: 12/16/21  8:46 AM   Specimen: Tracheal  Aspirate; Respiratory  Result Value Ref Range Status   Specimen Description TRACHEAL ASPIRATE  Final   Special Requests NONE  Final   Gram Stain   Final    ABUNDANT WBC PRESENT,BOTH PMN AND MONONUCLEAR MODERATE GRAM NEGATIVE RODS Performed at [facility], [place] [address] [place], [place] [address]    Culture FEW PSEUDOMONAS AERUGINOSA  Final   Report Status 12/19/2021 FINAL  Final   Organism ID, Bacteria PSEUDOMONAS AERUGINOSA  Final      Susceptibility   Pseudomonas aeruginosa - MIC*    CEFTAZIDIME 4 SENSITIVE Sensitive     CIPROFLOXACIN <=0.25 SENSITIVE Sensitive     GENTAMICIN <=1 SENSITIVE Sensitive     IMIPENEM 1 SENSITIVE Sensitive     PIP/TAZO <=4 SENSITIVE Sensitive     CEFEPIME 2 SENSITIVE Sensitive     * FEW PSEUDOMONAS AERUGINOSA     Radiology Studies: No results found.  Scheduled Meds:  Chlorhexidine Gluconate Cloth  6 each Topical Daily   docusate  100 mg Per Tube BID   feeding supplement (PROSource TF20)  60 mL Per Tube Daily   free water  200 mL Per Tube Q6H   heparin injection (subcutaneous)  5,000 Units Subcutaneous Q8H   insulin aspart  0-15 Units Subcutaneous Q4H   insulin detemir  10 Units Subcutaneous Daily   mouth rinse  15 mL Mouth Rinse 4 times per day   pantoprazole  40 mg Per Tube Daily   polyethylene glycol  17 g Per Tube Daily   sodium chloride flush  10-40 mL Intracatheter Q12H   Continuous Infusions:  sodium chloride 250 mL (12/18/21 1817)   feeding supplement (JEVITY 1.2 CAL) 1,000 mL (12/25/21 1319)   levETIRAcetam 500 mg (12/25/21 0912)     LOS: 11 days   Darliss Cheney, MD [facility]  12/25/2021, 2:35 PM   *Please note that this is a verbal dictation therefore any spelling or grammatical errors are due to the "[place] [facility]" system  interpretation.  Please page via [place] and do not message via secure chat for urgent patient care matters. Secure chat can be used for non urgent patient care matters.  How to contact the [facility] Attending or Consulting provider [place] or covering provider during after hours [place], for this patient?  Check the care team in [facility] and look for a) attending/consulting [facility] provider listed and b) the [facility] team listed. Page or secure chat [phone]. Log into [email or web address] and use [place]'s universal password to access. If you do not have the password, please contact the hospital operator. Locate the [facility] provider you are looking for under [facility] and page to a number that you can be directly reached. If you still have difficulty reaching the provider, please page the [facility] (Director on Call) for the Hospitalists listed on amion for assistance.

## 2021-12-25 NOTE — Progress Notes (Signed)
Speech Language Pathology Treatment: Dysphagia  Patient Details Name: Elizabeth Crosby MRN: [id number] DOB: 04-Apr-1945 Today's Date: 12/25/2021 Time: 7943-2761 SLP Time Calculation (min) (ACUTE ONLY): 14 min  Assessment / Plan / Recommendation Clinical Impression  Pt was sleeping soundly and snoring upon SLP arrival but awakened easily and maintained her alertness at least throughout therapy session, although becoming a little sleepy again by the end. She is verbally requesting water and appeared very eager for applesauce. She presents with signs of dysphagia more than aspiration, although delayed coughing was noted x1. She has oral residue, mild anterior loss, and multiple swallows with thin liquids and purees. She may be more appropriate for PO diet if she can start to maintain her level of alertness. For today, would try to offer her a few small sips of water/ice chips or a few bites of puree from the floor stock whenever mentation allows. Will f/u to see how this goes to see if she is appropriate for MBS or PO diet.    HPI HPI: Pt is a 76 yo female admitted from SNF new onset seizure requiring intubation 9/15-9/18. MRI negative for acute findings. Pt seen by SLP in July 2023 for post-extubation dysphagia. MBS 10/05/21 showed aspiration (PAS 7) of thin liquids, recommending Dys 3 diet and nectar thick liquids. She was advanced to regular solids/thin liquids prior to d/c as she was able to consistently take small, single sips. PMH incluides: HTN, CKD, glaucoma, gout, anxiety, HLD      SLP Plan  Continue with current plan of care      Recommendations for follow up therapy are one component of a multi-disciplinary discharge planning process, led by the attending physician.  Recommendations may be updated based on patient status, additional functional criteria and insurance authorization.    Recommendations  Diet recommendations: NPO;Other(comment) (bites of puree from floor stock and sips of water  when alert) Liquids provided via: Straw;Teaspoon Medication Administration: Via alternative means (could consider crushed in puree if necesary and if alert)                Oral Care Recommendations: Oral care QID Follow Up Recommendations: Skilled nursing-short term rehab (<3 hours/day) Assistance recommended at discharge: Frequent or constant Supervision/Assistance SLP Visit Diagnosis: Dysphagia, unspecified (R13.10) Plan: Continue with current plan of care           Osie Bond., M.A. [place] Office [phone]  Secure chat preferred   12/25/2021, 9:17 AM

## 2021-12-25 NOTE — Progress Notes (Signed)
Nutrition Follow-up  DOCUMENTATION CODES:   Obesity unspecified  INTERVENTION:   - Continue TF via Cortrak tube:  Jevity 1.2 at 50 ml/h  Prosource TF20 60 ml daily  Water flush @ 200 mL Q6H   Provides 1520 kcal, 87 gm protein, 972 ml free water daily (1772 mL free water with current water flush).   NUTRITION DIAGNOSIS:   Inadequate oral intake related to acute illness as evidenced by NPO status.  GOAL:   Patient will meet greater than or equal to 90% of their needs  -Progressing: tolerating TF at goal rate.   MONITOR:   Labs, TF tolerance, Weight trends  REASON FOR ASSESSMENT:   Consult Enteral/tube feeding initiation and management  ASSESSMENT:   Pt admitted from nursing home with AMS. Experienced seizure-like activity and was subsequently intubated for airway protection. PMH significant for HTN, CKD stage IV, glaucoma, gout, anxiety, T2DM, anemia, HLD. Recently admitted 7/5-7/11 or staph lugdunesis bacteremia  Pt continues receiving TF at goal rate. Pt is receiving TF order as stated above. Spoke with RN who reports that the pt is tolerating well with no issues. Pt is urinating and having bowel movements. Pt with hypernatremia but MD is aware and water flushes have been increased to 200 mL Q6H for now. RD will continue to monitor TF tolerance.   SLP saw pt this am. Unable to advance diet as this time due to her level of alertness.   NUTRITION - FOCUSED PHYSICAL EXAM:  Diet Order:   Diet Order             Diet NPO time specified  Diet effective now                   EDUCATION NEEDS:   No education needs have been identified at this time  Skin:  Skin Assessment: Reviewed RN Assessment  Last BM:  12/24/21  Height:   Ht Readings from Last 1 Encounters:  12/14/21 '5\' 1"'$  (1.549 m)    Weight:   Wt Readings from Last 1 Encounters:  12/25/21 83.5 kg    Ideal Body Weight:  47.7 kg  BMI:  Body mass index is 34.78 kg/m.  Estimated Nutritional  Needs:   Kcal:  1500-1700  Protein:  80-95 gm  Fluid:  >/=1.5L  Darby Fleeman Graciela Husbands, RD, LDN, CNSC

## 2021-12-26 DIAGNOSIS — R569 Unspecified convulsions: Secondary | ICD-10-CM | POA: Diagnosis not present

## 2021-12-26 LAB — GLUCOSE, CAPILLARY
Glucose-Capillary: 104 mg/dL — ABNORMAL HIGH (ref 70–99)
Glucose-Capillary: 127 mg/dL — ABNORMAL HIGH (ref 70–99)
Glucose-Capillary: 139 mg/dL — ABNORMAL HIGH (ref 70–99)
Glucose-Capillary: 140 mg/dL — ABNORMAL HIGH (ref 70–99)
Glucose-Capillary: 143 mg/dL — ABNORMAL HIGH (ref 70–99)
Glucose-Capillary: 151 mg/dL — ABNORMAL HIGH (ref 70–99)
Glucose-Capillary: 159 mg/dL — ABNORMAL HIGH (ref 70–99)

## 2021-12-26 MED ORDER — HYDRALAZINE HCL 20 MG/ML IJ SOLN: 10.0000 mg | Freq: Four times a day (QID) | INTRAMUSCULAR | Status: DC | PRN

## 2021-12-26 NOTE — Progress Notes (Signed)
Mobility Specialist: Progress Note   12/26/21 1712  Mobility  Activity Stood at bedside (STSx2)  Level of Assistance +2 (takes two people)  Assistive Device Other (Comment) (HHA)  Activity Response Tolerated fair  $Mobility charge 1 Mobility   Pt received in the bed and agreeable to mobility. Required modA with bed mobility and heavy modA to stand. C/o mild RLE pain during session, otherwise asymptomatic. Pt back to bed after session with call bell at her side.   [facility] Claudie Brickhouse Mobility Specialist Mobility Specialist [address]: [phone]

## 2021-12-26 NOTE — Progress Notes (Signed)
PROGRESS NOTE    Elizabeth Crosby  HYW:[id number] DOB: 11/17/1945 DOA: 12/14/2021 PCP: Elizabeth Neer, MD   Brief Narrative:  76 year old female with past medical history of CKD stage IV, hypertension, diabetes mellitus type 2, poor dentition, recent discharge from the hospital on 10/09/2021 for staph  lugdunensis  bacteremia was brought to hospital for acute encephalopathy and not following commands.  In route to hospital she was noted to have seizure like activity and was intubated for airway protection, code stroke was called, CT of the head was negative and was felt to be postictal from seizure, she was loaded with Keppra and transferred to [facility] for long-term EEG.  MRI of the brain showed no acute intracranial normality and T2 hyperintensity. Significant Events: 9/15 admitted with working diagnosis of seizure.  Seized in route to the emergency room and was unable to protect airway on arrival.  Intubated.  Code stroke called.  CTH negative for stroke.   Loaded with IV Keppra, transferred to [facility] for MRI of brain and LTM-EEG monitoring. 9/17: MRI completed with areas of chronic siderosis felt possibly secondary to remote hypoxic ischemic event.   9/17 Echocardiogram with EF 70 to 75% no regional wall abnormality grade 1 diastolic dysfunction RV systolic function normal left atrial size mildly dilated agitated saline contrast bubble negative 9/18:  Extubated.  Very awake postextubation.  Concern for need for reintubation, had goals of care discussion.  Patient DO NOT RESUSCITATE, no reintubation. 9/19 remaining weak, transfer to progressive    Assessment & Plan:   Principal Problem:   Seizures ([place]) Active Problems:   Leukocytosis   GERD (gastroesophageal reflux disease)   Morbid obesity (HCC)   DM2 (diabetes mellitus, type 2) (HCC)   Acute metabolic encephalopathy   Acute renal failure superimposed on stage 4 chronic kidney disease (HCC)   History of endometrial cancer    Anemia   Protein-calorie malnutrition, severe (HCC)   Severe muscle deconditioning   DNR (do not resuscitate)   Pseudomonas pneumonia ([place])   Obesity (BMI 30-39.9)  New onset seizures:  -No clear etiology, MRI brain showed no acute finding, but showed remote history of what looks like prior hypoxic insult -Neurology was consulted, patient given Keppra; continue Keppra twice a day -Patient will need skilled nursing facility -Neurology to follow-up as outpatient   Acute metabolic encephalopathy: -Continues to be lethargic but oriented x2-3 when she communicates.  Hyperkalemia: Resolved.  Will recheck tomorrow.  Acute respiratory failure with hypoxemia/Pseudomonas pneumonia -Patient was intubated to protect the airway; s/p extubation -Patient is DNR/DNI -Patient is currently n.p.o. as per speech therapy recommendations -Might need swallow evaluation next week if failed may be a candidate for PEG tube -continue medications through NG tube -Has been weaned off to room air - completed 10 days course of antibiotics/cefepime on 12/23/2021.   Nutrition:  -NG tube feedings, Core track placed 12/18/2021. -Speech therapy saw patient recommended to be n.p.o. SLP on board, reassessed again, may be a candidate for PEG tube if no improvement in mentation and swallowing.  SLP recommends NPO except for small bites of purees from the floor stock and wants to continue tube feeds in the meantime.  Discussed with son over the phone on 12/23/2021, he was not sure if the patient would want PEG tube.  He said he is going to start discussing with her daughter about that decision.  Hypernatremia: -Was started on  free water per tube every 12 hours; changed to every 6 hours sodium 147  on 12/24/2021.   Acute kidney injury on CKD stage IIIa -Baseline creatinine 1.1-1.2.  Creatinine now back to baseline.   Hypotension -Resolved, patient was started on midodrine which is currently on hold -Will monitor blood  pressure closely   Hypertension -Antihypertensive medication on hold due to low blood pressure   Diabetes mellitus type 2: Due to hyperglycemia started on Semglee 10 units on 12/24/2021 and continues to be on SSI.  Blood sugar better.  Will monitor closely.   GERD (gastroesophageal reflux disease) Continue PPI.   Normocytic anemia: -Hemoglobin is 8.6 today Follow CBC in a.m.  GOC: Patient carries poor prognosis.  I have consulted palliative care for help.  DVT prophylaxis: heparin injection 5,000 Units Start: 12/15/21 1445 Place and maintain sequential compression device Start: 12/14/21 2210   Code Status: DNR  Family Communication:  None present at bedside.    Status is: Inpatient Remains inpatient appropriate because: Pending placement and further assessment by SLP.   Estimated body mass index is 34.78 kg/m as calculated from the following:   Height as of this encounter: '5\' 1"'$  (1.549 m).   Weight as of this encounter: 83.5 kg.    Nutritional Assessment: Body mass index is 34.78 kg/m.Marland Kitchen Seen by dietician.  I agree with the assessment and plan as outlined below: Nutrition Status: Nutrition Problem: Inadequate oral intake Etiology: acute illness Signs/Symptoms: NPO status Interventions: Tube feeding  . Skin Assessment: I have examined the patient's skin and I agree with the wound assessment as performed by the wound care RN as outlined below:    Consultants:  Neurology-signed off  Procedures:  None  Antimicrobials:  Anti-infectives (From admission, onward)    Start     Dose/Rate Route Frequency Ordered Stop   12/17/21 1300  vancomycin (VANCOREADY) IVPB 500 mg/100 mL  Status:  Discontinued        500 mg 100 mL/hr over 60 Minutes Intravenous Every 24 hours 12/16/21 1059 12/17/21 0928   12/16/21 1145  vancomycin (VANCOCIN) IVPB 1000 mg/200 mL premix        1,000 mg 200 mL/hr over 60 Minutes Intravenous  Once 12/16/21 1059 12/16/21 1307   12/16/21 1145  ceFEPIme  (MAXIPIME) 2 g in sodium chloride 0.9 % 100 mL IVPB        2 g 200 mL/hr over 30 Minutes Intravenous Every 24 hours 12/16/21 1059 12/23/21 1310         Subjective:  Patient seen and examined.  Lethargic as usual but oriented when able to talk.  No complaints.  She is missing her daughter.  Objective: Vitals:   12/26/21 0448 12/26/21 0700 12/26/21 0730 12/26/21 0800  BP:   (!) 145/51   Pulse:   79   Resp:  '14 19 17  '$ Temp: 98.3 F (36.8 C)  97.7 F (36.5 C)   TempSrc: Oral  Oral   SpO2: 100%  99%   Weight:      Height:        Intake/Output Summary (Last 24 hours) at 12/26/2021 1104 Last data filed at 12/26/2021 0743 Gross per 24 hour  Intake 30 ml  Output 200 ml  Net -170 ml    Filed Weights   12/21/21 0500 12/22/21 0500 12/25/21 0434  Weight: 88.1 kg 80 kg 83.5 kg    Examination:  General exam: Appears like positive but comfortably. Respiratory system: Clear to auscultation. Respiratory effort normal. Cardiovascular system: S1 & S2 heard, RRR. No JVD, murmurs, rubs, gallops or clicks. No pedal edema.  Gastrointestinal system: Abdomen is nondistended, soft and nontender. No organomegaly or masses felt. Normal bowel sounds heard. Central nervous system: Left oriented. Skin: No rashes, lesions or ulcers.   Data Reviewed: I have personally reviewed following labs and imaging studies  CBC: Recent Labs  Lab 12/19/21 2110 12/20/21 0420 12/22/21 0640 12/22/21 0737 12/24/21 0606  WBC 10.9* 8.8 10.0 10.0 11.7*  NEUTROABS 6.5  --   --  6.3 7.8*  HGB 8.8* 8.1* 8.5* 8.6* 8.4*  HCT 25.3* 24.2* 24.7* 25.1* 25.0*  MCV 93.7 95.3 95.4 95.8 96.9  PLT 192 171 163 168 706    Basic Metabolic Panel: Recent Labs  Lab 12/21/21 0439 12/22/21 0640 12/22/21 0737 12/24/21 0606 12/24/21 1036 12/24/21 1528 12/24/21 1837 12/25/21 0444 12/25/21 1403  NA 147* 148*  --  148* 147*  --   --  147*  --   K 4.7 5.0  --  5.7* 5.5* 5.7* 6.0* 5.4* 5.1  CL 116* 118*  --  119* 121*   --   --  122*  --   CO2 23 25  --  22 22  --   --  18*  --   GLUCOSE 160* 163*  --  209* 192*  --   --  137*  --   BUN 58* 56*  --  53* 52*  --   --  52*  --   CREATININE 1.11* 1.03*  --  1.21* 1.10*  --   --  1.04*  --   CALCIUM 9.1 9.1  --  9.5 9.1  --   --  8.8*  --   MG  --   --  1.9  --   --   --   --   --   --   PHOS  --   --  2.7  --   --   --   --   --   --     GFR: Estimated Creatinine Clearance: 45.8 mL/min (A) (by C-G formula based on SCr of 1.04 mg/dL (H)). Liver Function Tests: Recent Labs  Lab 12/22/21 0640  AST 22  ALT 15  ALKPHOS 139*  BILITOT 0.5  PROT 5.2*  ALBUMIN 1.8*    No results for input(s): "LIPASE", "AMYLASE" in the last 168 hours. No results for input(s): "AMMONIA" in the last 168 hours. Coagulation Profile: No results for input(s): "INR", "PROTIME" in the last 168 hours. Cardiac Enzymes: No results for input(s): "CKTOTAL", "CKMB", "CKMBINDEX", "TROPONINI" in the last 168 hours. BNP (last 3 results) No results for input(s): "PROBNP" in the last 8760 hours. HbA1C: No results for input(s): "HGBA1C" in the last 72 hours. CBG: Recent Labs  Lab 12/25/21 1728 12/25/21 2015 12/26/21 0057 12/26/21 0435 12/26/21 0751  GLUCAP 114* 133* 143* 151* 140*    Lipid Profile: No results for input(s): "CHOL", "HDL", "LDLCALC", "TRIG", "CHOLHDL", "LDLDIRECT" in the last 72 hours. Thyroid Function Tests: No results for input(s): "TSH", "T4TOTAL", "FREET4", "T3FREE", "THYROIDAB" in the last 72 hours. Anemia Panel: No results for input(s): "VITAMINB12", "FOLATE", "FERRITIN", "TIBC", "IRON", "RETICCTPCT" in the last 72 hours. Sepsis Labs: Recent Labs  Lab 12/19/21 2110  PROCALCITON 1.77     No results found for this or any previous visit (from the past 240 hour(s)).    Radiology Studies: No results found.  Scheduled Meds:  Chlorhexidine Gluconate Cloth  6 each Topical Daily   docusate  100 mg Per Tube BID   feeding supplement (PROSource TF20)  60  mL  Per Tube Daily   free water  200 mL Per Tube Q6H   heparin injection (subcutaneous)  5,000 Units Subcutaneous Q8H   insulin aspart  0-15 Units Subcutaneous Q4H   insulin detemir  10 Units Subcutaneous Daily   mouth rinse  15 mL Mouth Rinse 4 times per day   pantoprazole  40 mg Per Tube Daily   polyethylene glycol  17 g Per Tube Daily   sodium chloride flush  10-40 mL Intracatheter Q12H   Continuous Infusions:  sodium chloride 250 mL (12/18/21 1817)   feeding supplement (JEVITY 1.2 CAL) 1,000 mL (12/26/21 0757)   levETIRAcetam Stopped (12/26/21 0728)     LOS: 12 days   Darliss Cheney, MD [facility]  12/26/2021, 11:04 AM   *Please note that this is a verbal dictation therefore any spelling or grammatical errors are due to the "[place] [employer]" system interpretation.  Please page via [place] and do not message via secure chat for urgent patient care matters. Secure chat can be used for non urgent patient care matters.  How to contact the [facility] Attending or Consulting provider Elizabeth or covering provider during after hours [place], for this patient?  Check the care team in [facility] and look for a) attending/consulting [facility] provider listed and b) the [facility] team listed. Page or secure chat [phone]. Log into [email or web address] and use [place]'s universal password to access. If you do not have the password, please contact the hospital operator. Locate the [facility] provider you are looking for under [facility] and page to a number that you can be directly reached. If you still have difficulty reaching the provider, please page the [facility] (Director on Call) for the Hospitalists listed on amion for assistance.

## 2021-12-26 NOTE — Evaluation (Signed)
Occupational Therapy Evaluation Patient Details Name: Elizabeth Crosby MRN: [id number] DOB: 1945-07-06 Today's Date: 12/26/2021   History of Present Illness 76 yo female admitted from SNF with new onset seizure requiring intubation 9/15-9/18. MRI negative for acute findings.  PMH: CKD III, DM, GERD, anxiety, gout, HLD, HTN, and obesity.   Clinical Impression   Pt admitted with the above diagnosis and has the deficits outlined below. Pt would benefit from cont OT to increase independence with basic adls to a min assist level in hopes of eventually d/cing home. Pt states husband currently hs a broken leg and cannot assist with her care.  Feel pt will need ST SNF care prior to going home. Pt came from SNF after being d/c there after a July 2023 admission.  Feel this will be best option for therapy prior to returning home.       Recommendations for follow up therapy are one component of a multi-disciplinary discharge planning process, led by the attending physician.  Recommendations may be updated based on patient status, additional functional criteria and insurance authorization.   Follow Up Recommendations  Skilled nursing-short term rehab (<3 hours/day)    Assistance Recommended at Discharge Frequent or constant Supervision/Assistance  Patient can return home with the following A lot of help with bathing/dressing/bathroom;Two people to help with walking and/or transfers;Assistance with cooking/housework;Direct supervision/assist for medications management;Assistance with feeding;Assist for transportation;Help with stairs or ramp for entrance;Direct supervision/assist for financial management    Functional Status Assessment  Patient has had a recent decline in their functional status and demonstrates the ability to make significant improvements in function in a reasonable and predictable amount of time.  Equipment Recommendations  Other (comment) (tbd)    Recommendations for Other Services        Precautions / Restrictions Precautions Precautions: Fall Precaution Comments: coretrak, seizure Restrictions Weight Bearing Restrictions: No      Mobility Bed Mobility Overal bed mobility: Needs Assistance Bed Mobility: Supine to Sit, Sit to Supine     Supine to sit: HOB elevated, Mod assist Sit to supine: Max assist   General bed mobility comments: Pt able to assist up to EOB but very tired after sitting on EOB and could not assist back into bed.    Transfers Overall transfer level: Needs assistance Equipment used: 2 person hand held assist Transfers: Bed to chair/wheelchair/BSC     Squat pivot transfers: Max assist, +2 physical assistance       General transfer comment: Pt unable to assist much with UEs. Pt did bear weight through legs but unable to assist much with transfer      Balance Overall balance assessment: Needs assistance Sitting-balance support: Feet supported Sitting balance-Leahy Scale: Fair Sitting balance - Comments: Pt required cues to maintain midline but did not physically have to correct pt to midline. Pt with R lean Postural control: Posterior lean, Right lateral lean   Standing balance-Leahy Scale: Zero Standing balance comment: unable                           ADL either performed or assessed with clinical judgement   ADL Overall ADL's : Needs assistance/impaired Eating/Feeding: NPO   Grooming: Wash/dry hands;Wash/dry face;Oral care;Applying deodorant;Moderate assistance;Bed level   Upper Body Bathing: Moderate assistance;Sitting   Lower Body Bathing: Total assistance;+2 for physical assistance;Sit to/from stand   Upper Body Dressing : Maximal assistance;Sitting   Lower Body Dressing: Total assistance;+2 for physical assistance;Sit to/from stand  Toilet Transfer: Maximal assistance;+2 for physical assistance;Squat-pivot;BSC/3in1   Toileting- Clothing Manipulation and Hygiene: +2 for physical assistance;Total  assistance;Sit to/from stand       Functional mobility during ADLs: Maximal assistance;+2 for physical assistance;+2 for safety/equipment General ADL Comments: Pt very limited with all adls due to profound UE and LE weakness. Pt sat on EOB but unable to stand at this time.  Pt did bear weight on BLE to scoot up in bed with max assist.     Vision Baseline Vision/History: 1 Wears glasses Ability to See in Adequate Light: 0 Adequate Patient Visual Report: No change from baseline Vision Assessment?: No apparent visual deficits Additional Comments: Pt located all adl items in room when asked, read date and time on clock and saw items in all visual fields.     Perception     Praxis      Pertinent Vitals/Pain Pain Assessment Pain Assessment: Faces Faces Pain Scale: Hurts little more Pain Location: Pt c/o spasms in R foot causing her pain. Pt also with pain when touching R big toe Pain Descriptors / Indicators: Grimacing, Guarding, Moaning Pain Intervention(s): Limited activity within patient's tolerance, Monitored during session, Repositioned     Hand Dominance Right   Extremity/Trunk Assessment Upper Extremity Assessment Upper Extremity Assessment: Generalized weakness RUE Deficits / Details: PROM WFL Strength:  shoulder 2/5, biceps/trieps 3/5, grip 3+/5 LUE Deficits / Details: PROM WFL Strength:  shoulder 2/5, biceps/triceps 3/5, grip 3+/5   Lower Extremity Assessment Lower Extremity Assessment: Defer to PT evaluation   Cervical / Trunk Assessment Cervical / Trunk Assessment: Normal   Communication Communication Communication: No difficulties;Other (comment) (speech slow and labored)   Cognition Arousal/Alertness: Awake/alert Behavior During Therapy: Flat affect Overall Cognitive Status: Impaired/Different from baseline Area of Impairment: Attention, Safety/judgement, Awareness, Problem solving                 Orientation Level: Situation Current Attention Level:  Sustained Memory: Decreased short-term memory Following Commands: Follows one step commands consistently Safety/Judgement: Decreased awareness of deficits, Decreased awareness of safety Awareness: Emergent Problem Solving: Slow processing, Decreased initiation, Difficulty sequencing, Requires verbal cues, Requires tactile cues General Comments: Pt knew date and which hosptial she was in. Was clearer with home set up details than previous dates     General Comments  Pt limited by weakness, slow processing, body habitus and cognitive deficits.    Exercises     Shoulder Instructions      Home Living Family/patient expects to be discharged to:: Skilled nursing facility                                 Additional Comments: Pt has been at [facility] since d/c from hospital July 2023. She lived at home I/mod I prior to that time.      Prior Functioning/Environment Prior Level of Function : Needs assist       Physical Assist : Mobility (physical);ADLs (physical)   ADLs (physical): IADLs;Toileting;Dressing;Bathing;Grooming Mobility Comments: Mod I with cane vs RW prior to 09/2021 hospitalization. Pt required mod assist during that hospital stay and d/c'd to SNF. Pt has been at SNF until current hospitalization. Unsure if she was still active with therapy at SNF. ADLs Comments: takes showers or bird baths, reports independent but increased time before July 2023. Since then has been at [facility] requiring mod to max assist with all adls.        OT Problem List:  Decreased strength;Decreased activity tolerance;Impaired balance (sitting and/or standing);Decreased coordination;Decreased cognition;Decreased safety awareness;Decreased knowledge of use of DME or AE;Decreased knowledge of precautions;Obesity;Impaired UE functional use;Pain;Increased edema      OT Treatment/Interventions: Self-care/ADL training;DME and/or AE instruction;Therapeutic activities;Balance training    OT Goals(Current  goals can be found in the care plan section) Acute Rehab OT Goals Patient Stated Goal: none stated OT Goal Formulation: With patient Time For Goal Achievement: 01/09/22 Potential to Achieve Goals: Fair ADL Goals Pt Will Perform Grooming: with min assist;sitting Pt Will Perform Upper Body Bathing: with min assist;sitting Pt Will Transfer to Toilet: with mod assist;squat pivot transfer;bedside commode Additional ADL Goal #1: Pt will transfer sit to stand with mod assist and maintain standing with min assist for 30 sec in prep for adls in standing.  OT Frequency: Min 2X/week    Co-evaluation              AM-PAC OT "6 Clicks" Daily Activity     Outcome Measure Help from another person eating meals?: Total Help from another person taking care of personal grooming?: A Lot Help from another person toileting, which includes using toliet, bedpan, or urinal?: Total Help from another person bathing (including washing, rinsing, drying)?: A Lot Help from another person to put on and taking off regular upper body clothing?: A Lot Help from another person to put on and taking off regular lower body clothing?: Total 6 Click Score: 9   End of Session Nurse Communication: Mobility status  Activity Tolerance: Patient tolerated treatment well Patient left: in bed;with call bell/phone within reach;with bed alarm set  OT Visit Diagnosis: Unsteadiness on feet (R26.81)                Time: 5885-0277 OT Time Calculation (min): 29 min Charges:  OT General Charges $OT Visit: 1 Visit OT Evaluation $OT Eval Moderate Complexity: 1 Mod OT Treatments $Self Care/Home Management : 8-22 mins  Glenford Peers 12/26/2021, 12:52 PM

## 2021-12-27 ENCOUNTER — Inpatient Hospital Stay (HOSPITAL_COMMUNITY): Payer: Medicare Other

## 2021-12-27 DIAGNOSIS — Z515 Encounter for palliative care: Secondary | ICD-10-CM

## 2021-12-27 DIAGNOSIS — G9341 Metabolic encephalopathy: Secondary | ICD-10-CM | POA: Diagnosis not present

## 2021-12-27 DIAGNOSIS — R569 Unspecified convulsions: Secondary | ICD-10-CM | POA: Diagnosis not present

## 2021-12-27 DIAGNOSIS — Z66 Do not resuscitate: Secondary | ICD-10-CM | POA: Diagnosis not present

## 2021-12-27 DIAGNOSIS — R29898 Other symptoms and signs involving the musculoskeletal system: Secondary | ICD-10-CM

## 2021-12-27 LAB — BASIC METABOLIC PANEL
Anion gap: 10 (ref 5–15)
BUN: 51 mg/dL — ABNORMAL HIGH (ref 8–23)
CO2: 22 mmol/L (ref 22–32)
Calcium: 9.4 mg/dL (ref 8.9–10.3)
Chloride: 113 mmol/L — ABNORMAL HIGH (ref 98–111)
Creatinine, Ser: 0.96 mg/dL (ref 0.44–1.00)
GFR, Estimated: >60 mL/min (ref >60)
Glucose, Bld: 157 mg/dL — ABNORMAL HIGH (ref 70–99)
Potassium: 5.4 mmol/L — ABNORMAL HIGH (ref 3.5–5.1)
Sodium: 145 mmol/L (ref 135–145)

## 2021-12-27 LAB — GLUCOSE, CAPILLARY
Glucose-Capillary: 134 mg/dL — ABNORMAL HIGH (ref 70–99)
Glucose-Capillary: 117 mg/dL — ABNORMAL HIGH (ref 70–99)
Glucose-Capillary: 188 mg/dL — ABNORMAL HIGH (ref 70–99)
Glucose-Capillary: 132 mg/dL — ABNORMAL HIGH (ref 70–99)
Glucose-Capillary: 68 mg/dL — ABNORMAL LOW (ref 70–99)
Glucose-Capillary: 77 mg/dL (ref 70–99)

## 2021-12-27 LAB — POTASSIUM: Potassium: 5.2 mmol/L — ABNORMAL HIGH (ref 3.5–5.1)

## 2021-12-27 MED ORDER — DOCUSATE SODIUM 50 MG/5ML PO LIQD
100.0000 mg | Freq: Two times a day (BID) | ORAL | Status: DC
  Filled 2021-12-27 (×3): qty 10

## 2021-12-27 MED ORDER — LEVETIRACETAM 500 MG PO TABS
500.0000 mg | ORAL_TABLET | Freq: Two times a day (BID) | ORAL | Status: DC
  Administered 2021-12-27 – 2021-12-28 (×3): 500 mg via ORAL
  Filled 2021-12-27 (×3): qty 1

## 2021-12-27 MED ORDER — ACETAMINOPHEN 160 MG/5ML PO SOLN: 650.0000 mg | Freq: Four times a day (QID) | ORAL | Status: DC | PRN

## 2021-12-27 MED ORDER — SODIUM ZIRCONIUM CYCLOSILICATE 10 G PO PACK
10.0000 g | PACK | Freq: Once | ORAL | Status: AC
  Administered 2021-12-27: 10 g via ORAL
  Filled 2021-12-27: qty 1

## 2021-12-27 MED ORDER — PANTOPRAZOLE SODIUM 40 MG PO TBEC
40.0000 mg | DELAYED_RELEASE_TABLET | Freq: Every day | ORAL | Status: DC
  Administered 2021-12-28: 40 mg via ORAL
  Filled 2021-12-27 (×2): qty 1

## 2021-12-27 NOTE — Progress Notes (Signed)
Modified Barium Swallow Progress Note  Patient Details  Name: JOVONDA SELNER MRN: [id number] Date of Birth: 1945/04/24  Today's Date: 12/27/2021  Modified Barium Swallow completed.  Full report located under Chart Review in the Imaging Section.  Brief recommendations include the following:  Clinical Impression  Pt presents with a very mild oropharyngeal dysphagia. She has mildly reduced lingual propulsion for posterior transit as well as reduced base of tongue retraction and epiglottic inversion. This results in mild lingual and vallecular residue across liquid and solid consistencies, but she typically uses a second swallow spontaneously to reduce residue in her oral cavity as well as her pharynx. She maintained good airway protection across the duration of this study. Recommend that pt initiate Dys 3 diet and thin liquids.   Swallow Evaluation Recommendations       SLP Diet Recommendations: Dysphagia 3 (Mech soft) solids;Thin liquid   Liquid Administration via: Cup;Straw   Medication Administration: Whole meds with liquid   Supervision: Staff to assist with self feeding   Compensations: Minimize environmental distractions;Slow rate;Small sips/bites   Postural Changes: Seated upright at 90 degrees;Remain semi-upright after after feeds/meals (Comment)   Oral Care Recommendations: Oral care BID        Osie Bond., M.A. [place] Office [phone]  Secure chat preferred  12/27/2021,2:58 PM

## 2021-12-27 NOTE — Progress Notes (Signed)
Speech Language Pathology Treatment: Dysphagia  Patient Details Name: Elizabeth Crosby MRN: [id number] DOB: 05-21-1945 Today's Date: 12/27/2021 Time: 1779-3903 SLP Time Calculation (min) (ACUTE ONLY): 12 min  Assessment / Plan / Recommendation Clinical Impression  Despite some confusion, pt remains very alert and is attentive to PO trials. She is eager for POs and interested in some cake for her birthday. She continues to show subtle signs of possible dysphagia, including multiple subswallows. She coughed only once, with a bite of graham cracker, but also couldn't be challenged to take more than a small, single sip at a time with thin liquids. Given the above as well as h/o dysphagia, recommend proceeding with MBS to assess oropharyngeal swallow and hopefully return to PO diet.    HPI HPI: Pt is a 76 yo female admitted from SNF new onset seizure requiring intubation 9/15-9/18. MRI negative for acute findings. Pt seen by SLP in July 2023 for post-extubation dysphagia. MBS 10/05/21 showed aspiration (PAS 7) of thin liquids, recommending Dys 3 diet and nectar thick liquids. She was advanced to regular solids/thin liquids prior to d/c as she was able to consistently take small, single sips. PMH incluides: HTN, CKD, glaucoma, gout, anxiety, HLD      SLP Plan  MBS      Recommendations for follow up therapy are one component of a multi-disciplinary discharge planning process, led by the attending physician.  Recommendations may be updated based on patient status, additional functional criteria and insurance authorization.    Recommendations  Diet recommendations: NPO;Other(comment) (bites of puree, sips of water) Liquids provided via: Straw;Teaspoon Medication Administration: Via alternative means (can crush in puree if needed) Supervision: Full supervision/cueing for compensatory strategies                Oral Care Recommendations: Oral care QID Follow Up Recommendations: Skilled nursing-short  term rehab (<3 hours/day) Assistance recommended at discharge: Frequent or constant Supervision/Assistance SLP Visit Diagnosis: Dysphagia, unspecified (R13.10) Plan: MBS           Osie Bond., M.A. [place] Office [phone]  Secure chat preferred   12/27/2021, 10:13 AM

## 2021-12-27 NOTE — Consult Note (Signed)
Consultation Note Date: 12/27/2021   Patient Name: Elizabeth Crosby  DOB: 07/10/1945  MRN: [id number]  Age / Sex: 76 y.o., female  PCP: [name], MD Referring Physician: Darliss Cheney, MD  Reason for Consultation: Establishing goals of care and Psychosocial/spiritual support  HPI/Patient Profile: 76 y.o. female  admitted on 12/14/2021 with  past medical history of CKD stage IV, hypertension, diabetes mellitus type 2, poor dentition, recent discharge from the hospital on 10/09/2021 for staph  bacteremia was brought to hospital for acute encephalopathy.    In route to hospital she was noted to have seizure like activity and was intubated for airway protection, code stroke was called, CT of the head was negative and was felt to be postictal from seizure, she was loaded with Keppra and transferred to [facility] for long-term EEG.    MRI of the brain showed no acute intracranial normality and T2 hyperintensity.  Significant Events: 9/15 admitted with working diagnosis of seizure.  Seized in route to the emergency room and was unable to protect airway on arrival.  Intubated.  Code stroke called.  CTH negative for stroke.   Loaded with IV Keppra, transferred to [facility] for MRI of brain and LTM-EEG monitoring. 9/17: MRI completed with areas of chronic siderosis felt possibly secondary to remote hypoxic ischemic event.   9/17 Echocardiogram with EF 70 to 75% no regional wall abnormality grade 1 diastolic dysfunction RV systolic function normal left atrial size mildly dilated agitated saline contrast bubble negative 9/18:  Extubated.  Very awake postextubation.  Concern for need for reintubation, had goals of care discussion.  Patient DO NOT RESUSCITATE, no reintubation. 9/19 remaining weak, transfer to progressive 9/26 continues with core track for nutritional support 9/28 passed modified barium swallow,  recommendation  for diet by speech  Today is day 13 of this hospitalization  Patient and family face treatment option decisions, advanced directive decisions and anticipatory care needs.     Clinical Assessment and Goals of Care:  This NP Wadie Lessen reviewed medical records, received report from team, assessed the patient and then meet at the patient's bedside along with her daughter/Janet Harrelson and son/Ronald by telephone to discuss diagnosis, prognosis, GOC, EOL wishes disposition and options.   Concept of Palliative Care was introduced as specialized medical care for people and their families living with serious illness.  If focuses on providing relief from the symptoms and stress of a serious illness.  The goal is to improve quality of life for both the patient and the family.   Values and goals of care important to patient and family were attempted to be elicited.  Created space and opportunity for patient  and family to explore thoughts and feelings regarding current medical situation.    This has been a hard few months for this family.  Patient's husband died 11/21/21.    Patient has been residing at [facility] for physical therapy over the past several months.  Family has noted significant short-term memory deficit, she continues to  forget that her husband has died.    Patient's children are very supportive.  Today family share loving memories together.  Today is the patient's birthday.  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  She loves her family and grandchildren. Patient is able to share with her family her value of comfort and dignity at this time in her life     A  discussion was had today regarding advanced directives.  Concepts specific to code status, artifical feeding and hydration, continued IV antibiotics and rehospitalization was had.    The difference between a aggressive medical intervention path  and a palliative  comfort care path for this patient at this time was had.     MOST form completed to reflect full comfort, copy scanned to medical record   Natural trajectory and expectations at EOL were discussed.   Education offered regarding hospice benefit; philosophy and eligibility.   Questions and concerns addressed.  Family encouraged to call with questions or concerns.     Plan of care  -DNR/DNI -No artificial feeding or hydration now or in the future -DC core track (patient passed modified barium swallow today)-diet as recommended by speech -Transition back to [facility], avoid rehospitalization - hospice services when eligible, [facility] team investigating   Patient has  healthcare power of attorney and  living will documented.  Her 2 children are decision makers.  A copy scanned and placed in hard chart      SUMMARY OF RECOMMENDATIONS    Code Status/Advance Care Planning: DNR    Palliative Prophylaxis:  Aspiration, Bowel Regimen, Frequent Pain Assessment, and Oral Care  Additional Recommendations (Limitations, Scope, Preferences): Avoid Hospitalization, No Artificial Feeding, No IV Antibiotics, and No IV Fluids  Psycho-social/Spiritual:  Desire for further Chaplaincy support:no Additional Recommendations: Education on Hospice  Prognosis:  < 6 months  Discharge Planning: transition back to [facility] ,hopefully in the morning     Primary Diagnoses: Present on Admission:  Acute metabolic encephalopathy  GERD (gastroesophageal reflux disease)  Morbid obesity ([place])  (Resolved) Acute respiratory failure (HCC)  Anemia  Acute renal failure superimposed on stage 4 chronic kidney disease (HCC)  Leukocytosis  (Resolved) Sepsis (HCC)  (Resolved) Hypotension due to drugs  (Resolved) Normal anion gap metabolic acidosis  Protein-calorie malnutrition, severe (HCC)  Severe muscle deconditioning  DNR (do not resuscitate)  Pseudomonas pneumonia ([place])  Obesity (BMI  30-39.9)   I have reviewed the medical record, interviewed the patient and family, and examined the patient. The following aspects are pertinent.  Past Medical History:  Diagnosis Date   Anxiety    Arthritis    "knees are bone on bone and i see dr. Tonita Cong for the cortisone injections "    Cellulitis    hx  of in the legs    Chronic kidney disease (CKD), stage III (moderate) (HCC)    Complication of anesthesia    doesnt know the name of the anesthesia , but i was vomiting while i was still under    Diabetes mellitus without complication (HCC)    type 2    GERD (gastroesophageal reflux disease)    Glaucoma    Gout    History of blood transfusion    as child after accident, after childbirth/hemorroid surgery   History of kidney stones    History of radiation therapy    vaginal brachytherapy  11/27/2017-12/25/2017   Dr Gery Pray   Hyperlipidemia    Hypertension  Leukocytes in urine    Lumbar back pain    Sleep apnea    non adherant with cpap device    Social History   Socioeconomic History   Marital status: Married    Spouse name: Not on file   Number of children: Not on file   Years of education: Not on file   Highest education level: Not on file  Occupational History   Not on file  Tobacco Use   Smoking status: Never   Smokeless tobacco: Never  Vaping Use   Vaping Use: Never used  Substance and Sexual Activity   Alcohol use: No   Drug use: No   Sexual activity: Yes  Other Topics Concern   Not on file  Social History Narrative   Not on file   Social Determinants of Health   Financial Resource Strain: Not on file  Food Insecurity: Not on file  Transportation Needs: Not on file  Physical Activity: Not on file  Stress: Not on file  Social Connections: Not on file   Family History  Problem Relation Age of Onset   Stroke Mother        Cardiac condition   Asthma Father    Colon cancer Father    Scheduled Meds:  Chlorhexidine Gluconate Cloth  6 each  Topical Daily   docusate  100 mg Per Tube BID   feeding supplement (PROSource TF20)  60 mL Per Tube Daily   free water  200 mL Per Tube Q6H   heparin injection (subcutaneous)  5,000 Units Subcutaneous Q8H   insulin aspart  0-15 Units Subcutaneous Q4H   insulin detemir  10 Units Subcutaneous Daily   mouth rinse  15 mL Mouth Rinse 4 times per day   pantoprazole  40 mg Per Tube Daily   polyethylene glycol  17 g Per Tube Daily   sodium chloride flush  10-40 mL Intracatheter Q12H   Continuous Infusions:  sodium chloride 250 mL (12/18/21 1817)   feeding supplement (JEVITY 1.2 CAL) 1,000 mL (12/27/21 0541)   levETIRAcetam Stopped (12/26/21 2154)   PRN Meds:.acetaminophen (TYLENOL) oral liquid 160 mg/5 mL, atropine, hydrALAZINE, LORazepam, mouth rinse, sodium chloride flush Medications Prior to Admission:  Prior to Admission medications   Medication Sig Start Date End Date Taking? Authorizing Provider  acetaminophen (TYLENOL) 500 MG tablet Take 1 tablet (500 mg total) by mouth every 4 (four) hours as needed for moderate pain, headache or fever. 11/25/20  Yes Pokhrel, Laxman, MD  Amino Acids-Protein Hydrolys (FEEDING SUPPLEMENT, PRO-STAT SUGAR FREE 64,) LIQD Take 30 mLs by mouth in the morning and at bedtime.   Yes [provider]  amLODipine (NORVASC) 5 MG tablet Take 5 mg by mouth daily.   Yes [provider]  ascorbic acid (VITAMIN C) 500 MG tablet Take 500 mg by mouth daily.   Yes [provider]  aspirin EC 81 MG tablet Take 81 mg by mouth daily.   Yes [provider]  Cholecalciferol (VITAMIN D) 50 MCG (2000 UT) CAPS Take 2,000 Units by mouth daily.   Yes [provider]  Cholecalciferol 1.25 MG (50000 UT) capsule Take 50,000 Units by mouth daily. Every Monday   Yes [provider]  diclofenac Sodium (VOLTAREN) 1 % GEL Apply 2 g topically every 12 (twelve) hours as needed (pain). Apply to bilateral knee   Yes [provider]   famotidine (PEPCID) 20 MG tablet Take 20 mg by mouth 2 (two) times daily. 08/23/21  Yes [provider]  ferrous sulfate 325 (65 FE) MG tablet Take 325 mg by mouth daily with breakfast.   Yes [provider]  furosemide (LASIX) 20 MG tablet Take 20 mg by mouth 2 (two) times daily. 09/28/21  Yes [provider]  Insulin Aspart (NOVOLOG FLEXPEN [place]) Inject 10 Units into the skin in the morning, at noon, and at bedtime.   Yes [provider]  insulin degludec (TRESIBA FLEXTOUCH) 100 UNIT/ML FlexTouch Pen Inject 40 Units into the skin daily.   Yes [provider]  ipratropium (ATROVENT) 0.06 % nasal spray Place 2 sprays into both nostrils every 6 (six) hours as needed for allergies. 08/10/21  Yes [provider]  lipase/protease/amylase (CREON) 36000 UNITS CPEP capsule Take 72,000 Units by mouth 3 (three) times daily with meals.   Yes [provider]  metoprolol succinate (TOPROL-XL) 25 MG 24 hr tablet Take 25 mg by mouth daily. 09/10/21  Yes [provider]  ondansetron (ZOFRAN-ODT) 4 MG disintegrating tablet Take 4 mg by mouth every 4 (four) hours as needed for nausea or vomiting.   Yes [provider]  promethazine (PHENERGAN) 25 MG tablet Take 25 mg by mouth as needed for nausea or vomiting. If not relived by two doses of Zofran   Yes [provider]  simvastatin (ZOCOR) 20 MG tablet Take 20 mg by mouth every evening.   Yes [provider]  tiZANidine (ZANAFLEX) 2 MG tablet Take 0.5-1 tablets (1-2 mg total) by mouth every 8 (eight) hours as needed for muscle spasms. Patient taking differently: Take 2 mg by mouth every 8 (eight) hours as needed for muscle spasms. 10/09/21  Yes Pahwani, Einar Grad, MD  traMADol (ULTRAM) 50 MG tablet Take 1 tablet (50 mg total) by mouth every 12 (twelve) hours as needed for severe pain or moderate pain. Patient taking differently: Take 50 mg by mouth every 12 (twelve) hours as needed  (pain). 10/09/21 10/09/22 Yes Pahwani, Einar Grad, MD  vitamin B-12 (CYANOCOBALAMIN) 250 MCG tablet Take 250 mcg by mouth daily.   Yes [provider]   Allergies  Allergen Reactions   Ozempic (0.25 Or 0.5 Mg-Dose) [Semaglutide(0.25 Or 0.'5mg'$ -Dos)] Nausea And Vomiting   Amoxil [Amoxicillin] Other (See Comments)    Cellulitis    Glucophage [Metformin] Diarrhea   Invokana [Canagliflozin] Other (See Comments)    UTI   Lipitor [Atorvastatin] Other (See Comments)    Myalgias   Lyrica Cr [Pregabalin Er] Other (See Comments)    Dizziness    Metformin And Related    Other Nausea And Vomiting    Pt states does not know drug but anesthesia drugs have made her have nausea and vomiting.    Sulfa Antibiotics Diarrhea   Trulicity [Dulaglutide] Other (See Comments)    GI Upset   Potassium-Containing Compounds Rash   Review of Systems  Neurological:  Positive for weakness.    Physical Exam Constitutional:      Appearance: She is underweight. She is ill-appearing.     Interventions: Nasal cannula in place.  Cardiovascular:     Rate and Rhythm: Normal rate.  Pulmonary:     Effort: Pulmonary effort is normal.  Musculoskeletal:     Comments: Generalized weakness and muscle atrophy  Skin:    General: Skin is warm and dry.  Neurological:     Mental Status: She is alert.  Psychiatric:        Cognition and Memory: Cognition is impaired.     Vital Signs: BP (!) 133/51 (  BP Location: Left Arm)   Pulse 69   Temp (!) 97.4 F (36.3 C) (Oral)   Resp 19   Ht '5\' 1"'$  (1.549 m)   Wt 83.5 kg   SpO2 100%   BMI 34.78 kg/m  Pain Scale: 0-10 POSS *See Group Information*: S-Acceptable,Sleep, easy to arouse Pain Score: 0-No pain   SpO2: SpO2: 100 % O2 Device:SpO2: 100 % O2 Flow Rate: .O2 Flow Rate (L/min): 4 L/min  IO: Intake/output summary:  Intake/Output Summary (Last 24 hours) at 12/27/2021 0941 Last data filed at 12/27/2021 5697 Gross per 24 hour  Intake 3931.55 ml  Output 450 ml  Net  3481.55 ml    LBM: Last BM Date : 12/26/21 Baseline Weight: Weight: 85.7 kg Most recent weight: Weight: 83.5 kg     Palliative Assessment/Data: 40 %     Discussed with Dr Doristine Bosworth and [facility] team   Time In: 1400 Time Out: 1530 Time Total: 90 minutes Greater than 50%  of this time was spent counseling and coordinating care related to the above assessment and plan.  Signed by: Wadie Lessen, NP   Please contact Palliative Medicine Team phone at [phone] for questions and concerns.  For individual provider: See Shea Evans

## 2021-12-27 NOTE — Progress Notes (Signed)
Pt blood sugar of 68, encourage PO intake of soda, rechecked pt blood sugar up to 77, encourage po intake as a whole to maintain stable blood sugar levels. [name] 12/27/21 8:30 PM

## 2021-12-27 NOTE — Progress Notes (Signed)
PROGRESS NOTE    Elizabeth Crosby  XAJ:[id number] DOB: [birth year] DOA: 12/14/2021 PCP: [name], MD   Brief Narrative:  76 year old female with past medical history of CKD stage IV, hypertension, diabetes mellitus type 2, poor dentition, recent discharge from the hospital on 10/09/2021 for staph  lugdunensis  bacteremia was brought to hospital for acute encephalopathy and not following commands.  In route to hospital she was noted to have seizure like activity and was intubated for airway protection, code stroke was called, CT of the head was negative and was felt to be postictal from seizure, she was loaded with Keppra and transferred to [facility] for long-term EEG.  MRI of the brain showed no acute intracranial normality and T2 hyperintensity. Significant Events: 9/15 admitted with working diagnosis of seizure.  Seized in route to the emergency room and was unable to protect airway on arrival.  Intubated.  Code stroke called.  CTH negative for stroke.   Loaded with IV Keppra, transferred to [facility] for MRI of brain and LTM-EEG monitoring. 9/17: MRI completed with areas of chronic siderosis felt possibly secondary to remote hypoxic ischemic event.   9/17 Echocardiogram with EF 70 to 75% no regional wall abnormality grade 1 diastolic dysfunction RV systolic function normal left atrial size mildly dilated agitated saline contrast bubble negative 9/18:  Extubated.  Very awake postextubation.  Concern for need for reintubation, had goals of care discussion.  Patient DO NOT RESUSCITATE, no reintubation. 9/19 remaining weak, transfer to progressive    Assessment & Plan:   Principal Problem:   Seizures ([place]) Active Problems:   Leukocytosis   GERD (gastroesophageal reflux disease)   Morbid obesity (HCC)   DM2 (diabetes mellitus, type 2) (HCC)   Acute metabolic encephalopathy   Acute renal failure superimposed on stage 4 chronic kidney disease (HCC)   History of endometrial cancer    Anemia   Protein-calorie malnutrition, severe (HCC)   Severe muscle deconditioning   DNR (do not resuscitate)   Pseudomonas pneumonia ([place])   Obesity (BMI 30-39.9)  New onset seizures:  -No clear etiology, MRI brain showed no acute finding, but showed remote history of what looks like prior hypoxic insult -Neurology was consulted, patient given Keppra; continue Keppra twice a day -Patient will need skilled nursing facility -Neurology to follow-up as outpatient   Acute metabolic encephalopathy: -Continues to be lethargic but oriented x2-3 when she communicates.  Hyperkalemia: 5.4 again.  We will give her another dose of Lokelma and recheck in the afternoon.  Acute respiratory failure with hypoxemia/Pseudomonas pneumonia -Patient was intubated to protect the airway; s/p extubation -Patient is DNR/DNI -Patient is currently n.p.o. as per speech therapy recommendations -Might need swallow evaluation next week if failed may be a candidate for PEG tube -continue medications through NG tube -Has been weaned off to room air - completed 10 days course of antibiotics/cefepime on 12/23/2021.   Nutrition:  -NG tube feedings, Core track placed 12/18/2021. -Speech therapy saw patient recommended to be n.p.o. SLP on board, reassessed again, may be a candidate for PEG tube if no improvement in mentation and swallowing.  SLP recommends NPO except for small bites of purees from the floor stock and wants to continue tube feeds in the meantime.  Discussed with son over the phone on 12/23/2021, he was not sure if the patient would want PEG tube.  He said he is going to start discussing with her daughter about that decision.  Hypernatremia: -Was started on  free water per  tube every 12 hours; changed to every 6 hours sodium 147 on 12/24/2021.   Acute kidney injury on CKD stage IIIa -Baseline creatinine 1.1-1.2.  Creatinine now back to baseline.   Hypotension -Resolved, patient was started on midodrine  which is currently on hold -Will monitor blood pressure closely   Hypertension -Antihypertensive medication on hold due to low blood pressure   Diabetes mellitus type 2: Due to hyperglycemia started on Semglee 10 units on 12/24/2021 and continues to be on SSI.  Blood sugar better.  Will monitor closely.   GERD (gastroesophageal reflux disease) Continue PPI.   Normocytic anemia: -Hemoglobin is 8.6 today Follow CBC in a.m.  GOC: Patient carries poor prognosis.  I have not seen any improvement in last several days.  I have consulted palliative care on 12/25/2021.  Still waiting for them to see patient and discussed with the family.  DVT prophylaxis: heparin injection 5,000 Units Start: 12/15/21 1445 Place and maintain sequential compression device Start: 12/14/21 2210   Code Status: DNR  Family Communication:  None present at bedside.    Status is: Inpatient Remains inpatient appropriate because: Pending placement and further assessment by SLP as well as palliative care.   Estimated body mass index is 34.78 kg/m as calculated from the following:   Height as of this encounter: '5\' 1"'$  (1.549 m).   Weight as of this encounter: 83.5 kg.    Nutritional Assessment: Body mass index is 34.78 kg/m.Marland Kitchen Seen by dietician.  I agree with the assessment and plan as outlined below: Nutrition Status: Nutrition Problem: Inadequate oral intake Etiology: acute illness Signs/Symptoms: NPO status Interventions: Tube feeding  . Skin Assessment: I have examined the patient's skin and I agree with the wound assessment as performed by the wound care RN as outlined below:    Consultants:  Neurology-signed off  Procedures:  None  Antimicrobials:  Anti-infectives (From admission, onward)    Start     Dose/Rate Route Frequency Ordered Stop   12/17/21 1300  vancomycin (VANCOREADY) IVPB 500 mg/100 mL  Status:  Discontinued        500 mg 100 mL/hr over 60 Minutes Intravenous Every 24 hours  12/16/21 1059 12/17/21 0928   12/16/21 1145  vancomycin (VANCOCIN) IVPB 1000 mg/200 mL premix        1,000 mg 200 mL/hr over 60 Minutes Intravenous  Once 12/16/21 1059 12/16/21 1307   12/16/21 1145  ceFEPIme (MAXIPIME) 2 g in sodium chloride 0.9 % 100 mL IVPB        2 g 200 mL/hr over 30 Minutes Intravenous Every 24 hours 12/16/21 1059 12/23/21 1310         Subjective:  Patient seen and examined.  Lethargic but arousable and oriented x2 with no complaints.  She has been like that since last several days.  Objective: Vitals:   12/27/21 1000 12/27/21 1100 12/27/21 1115 12/27/21 1155  BP:   134/74 (!) 130/52  Pulse:    72  Resp: 18 (!) '21 13 17  '$ Temp:    (!) 97.4 F (36.3 C)  TempSrc:    Oral  SpO2:    100%  Weight:      Height:        Intake/Output Summary (Last 24 hours) at 12/27/2021 1402 Last data filed at 12/27/2021 1150 Gross per 24 hour  Intake 4284.05 ml  Output 950 ml  Net 3334.05 ml    Filed Weights   12/21/21 0500 12/22/21 0500 12/25/21 0434  Weight: 88.1 kg 80  kg 83.5 kg    Examination:  General exam: Appears lethargic Respiratory system: Clear to auscultation. Respiratory effort normal. Cardiovascular system: S1 & S2 heard, RRR. No JVD, murmurs, rubs, gallops or clicks. No pedal edema. Gastrointestinal system: Abdomen is nondistended, soft and nontender. No organomegaly or masses felt. Normal bowel sounds heard. Central nervous system: Lethargic but oriented.  Global weakness all 4 extremities. Extremities: Symmetric 5 x 5 power. Skin: No rashes, lesions or ulcers.   Data Reviewed: I have personally reviewed following labs and imaging studies  CBC: Recent Labs  Lab 12/22/21 0640 12/22/21 0737 12/24/21 0606  WBC 10.0 10.0 11.7*  NEUTROABS  --  6.3 7.8*  HGB 8.5* 8.6* 8.4*  HCT 24.7* 25.1* 25.0*  MCV 95.4 95.8 96.9  PLT 163 168 035    Basic Metabolic Panel: Recent Labs  Lab 12/22/21 0640 12/22/21 0737 12/24/21 0606 12/24/21 1036  12/24/21 1528 12/24/21 1837 12/25/21 0444 12/25/21 1403 12/27/21 0455  NA 148*  --  148* 147*  --   --  147*  --  145  K 5.0  --  5.7* 5.5* 5.7* 6.0* 5.4* 5.1 5.4*  CL 118*  --  119* 121*  --   --  122*  --  113*  CO2 25  --  22 22  --   --  18*  --  22  GLUCOSE 163*  --  209* 192*  --   --  137*  --  157*  BUN 56*  --  53* 52*  --   --  52*  --  51*  CREATININE 1.03*  --  1.21* 1.10*  --   --  1.04*  --  0.96  CALCIUM 9.1  --  9.5 9.1  --   --  8.8*  --  9.4  MG  --  1.9  --   --   --   --   --   --   --   PHOS  --  2.7  --   --   --   --   --   --   --     GFR: Estimated Creatinine Clearance: 48.9 mL/min (by C-G formula based on SCr of 0.96 mg/dL). Liver Function Tests: Recent Labs  Lab 12/22/21 0640  AST 22  ALT 15  ALKPHOS 139*  BILITOT 0.5  PROT 5.2*  ALBUMIN 1.8*    No results for input(s): "LIPASE", "AMYLASE" in the last 168 hours. No results for input(s): "AMMONIA" in the last 168 hours. Coagulation Profile: No results for input(s): "INR", "PROTIME" in the last 168 hours. Cardiac Enzymes: No results for input(s): "CKTOTAL", "CKMB", "CKMBINDEX", "TROPONINI" in the last 168 hours. BNP (last 3 results) No results for input(s): "PROBNP" in the last 8760 hours. HbA1C: No results for input(s): "HGBA1C" in the last 72 hours. CBG: Recent Labs  Lab 12/26/21 1940 12/26/21 2357 12/27/21 0352 12/27/21 0717 12/27/21 1143  GLUCAP 127* 104* 134* 117* 188*    Lipid Profile: No results for input(s): "CHOL", "HDL", "LDLCALC", "TRIG", "CHOLHDL", "LDLDIRECT" in the last 72 hours. Thyroid Function Tests: No results for input(s): "TSH", "T4TOTAL", "FREET4", "T3FREE", "THYROIDAB" in the last 72 hours. Anemia Panel: No results for input(s): "VITAMINB12", "FOLATE", "FERRITIN", "TIBC", "IRON", "RETICCTPCT" in the last 72 hours. Sepsis Labs: No results for input(s): "PROCALCITON", "LATICACIDVEN" in the last 168 hours.   No results found for this or any previous visit (from  the past 240 hour(s)).    Radiology Studies: No results found.  Scheduled Meds:  Chlorhexidine Gluconate Cloth  6 each Topical Daily   docusate  100 mg Per Tube BID   feeding supplement (PROSource TF20)  60 mL Per Tube Daily   free water  200 mL Per Tube Q6H   heparin injection (subcutaneous)  5,000 Units Subcutaneous Q8H   insulin aspart  0-15 Units Subcutaneous Q4H   insulin detemir  10 Units Subcutaneous Daily   mouth rinse  15 mL Mouth Rinse 4 times per day   pantoprazole  40 mg Per Tube Daily   polyethylene glycol  17 g Per Tube Daily   sodium chloride flush  10-40 mL Intracatheter Q12H   Continuous Infusions:  sodium chloride 250 mL (12/18/21 1817)   feeding supplement (JEVITY 1.2 CAL) 50 mL/hr at 12/27/21 1150   levETIRAcetam Stopped (12/27/21 1015)     LOS: 13 days   Darliss Cheney, MD [facility]  12/27/2021, 2:02 PM   *Please note that this is a verbal dictation therefore any spelling or grammatical errors are due to the "[place] [employer]" system interpretation.  Please page via [place] and do not message via secure chat for urgent patient care matters. Secure chat can be used for non urgent patient care matters.  How to contact the [facility] Attending or Consulting provider [place] or covering provider during after hours [place], for this patient?  Check the care team in [facility] and look for a) attending/consulting TRH provider listed and b) the [facility] team listed. Page or secure chat [phone]. Log into [email or web address] and use [place]'s universal password to access. If you do not have the password, please contact the hospital operator. Locate the [facility] provider you are looking for under [facility] and page to a number that you can be directly reached. If you still have difficulty reaching the provider, please page the [facility] (Director on Call) for the Hospitalists listed on amion for assistance.

## 2021-12-28 DIAGNOSIS — Z7401 Bed confinement status: Secondary | ICD-10-CM | POA: Diagnosis not present

## 2021-12-28 DIAGNOSIS — Z79899 Other long term (current) drug therapy: Secondary | ICD-10-CM | POA: Diagnosis not present

## 2021-12-28 DIAGNOSIS — I10 Essential (primary) hypertension: Secondary | ICD-10-CM | POA: Diagnosis not present

## 2021-12-28 DIAGNOSIS — I509 Heart failure, unspecified: Secondary | ICD-10-CM | POA: Diagnosis not present

## 2021-12-28 DIAGNOSIS — R6889 Other general symptoms and signs: Secondary | ICD-10-CM | POA: Diagnosis not present

## 2021-12-28 DIAGNOSIS — E43 Unspecified severe protein-calorie malnutrition: Secondary | ICD-10-CM | POA: Diagnosis not present

## 2021-12-28 DIAGNOSIS — E119 Type 2 diabetes mellitus without complications: Secondary | ICD-10-CM | POA: Diagnosis not present

## 2021-12-28 DIAGNOSIS — E785 Hyperlipidemia, unspecified: Secondary | ICD-10-CM | POA: Diagnosis not present

## 2021-12-28 DIAGNOSIS — R569 Unspecified convulsions: Secondary | ICD-10-CM | POA: Diagnosis not present

## 2021-12-28 DIAGNOSIS — R1310 Dysphagia, unspecified: Secondary | ICD-10-CM | POA: Diagnosis not present

## 2021-12-28 DIAGNOSIS — N184 Chronic kidney disease, stage 4 (severe): Secondary | ICD-10-CM | POA: Diagnosis not present

## 2021-12-28 DIAGNOSIS — E1151 Type 2 diabetes mellitus with diabetic peripheral angiopathy without gangrene: Secondary | ICD-10-CM | POA: Diagnosis not present

## 2021-12-28 DIAGNOSIS — G9341 Metabolic encephalopathy: Secondary | ICD-10-CM | POA: Diagnosis not present

## 2021-12-28 DIAGNOSIS — D631 Anemia in chronic kidney disease: Secondary | ICD-10-CM | POA: Diagnosis not present

## 2021-12-28 DIAGNOSIS — R5381 Other malaise: Secondary | ICD-10-CM | POA: Diagnosis not present

## 2021-12-28 LAB — BASIC METABOLIC PANEL
Anion gap: 4 — ABNORMAL LOW (ref 5–15)
BUN: 55 mg/dL — ABNORMAL HIGH (ref 8–23)
CO2: 22 mmol/L (ref 22–32)
Calcium: 9 mg/dL (ref 8.9–10.3)
Chloride: 117 mmol/L — ABNORMAL HIGH (ref 98–111)
Creatinine, Ser: 1.14 mg/dL — ABNORMAL HIGH (ref 0.44–1.00)
GFR, Estimated: 50 mL/min — ABNORMAL LOW (ref >60)
Glucose, Bld: 111 mg/dL — ABNORMAL HIGH (ref 70–99)
Potassium: 5.2 mmol/L — ABNORMAL HIGH (ref 3.5–5.1)
Sodium: 143 mmol/L (ref 135–145)

## 2021-12-28 LAB — GLUCOSE, CAPILLARY
Glucose-Capillary: 124 mg/dL — ABNORMAL HIGH (ref 70–99)
Glucose-Capillary: 139 mg/dL — ABNORMAL HIGH (ref 70–99)
Glucose-Capillary: 71 mg/dL (ref 70–99)
Glucose-Capillary: 76 mg/dL (ref 70–99)
Glucose-Capillary: 86 mg/dL (ref 70–99)
Glucose-Capillary: 88 mg/dL (ref 70–99)

## 2021-12-28 MED ORDER — PANTOPRAZOLE SODIUM 40 MG PO TBEC: 40.0000 mg | DELAYED_RELEASE_TABLET | Freq: Every day | ORAL | 0 refills | Status: AC

## 2021-12-28 MED ORDER — TRESIBA FLEXTOUCH 100 UNIT/ML ~~LOC~~ SOPN: 10.0000 [IU] | PEN_INJECTOR | Freq: Every day | SUBCUTANEOUS | 0 refills | Status: AC

## 2021-12-28 MED ORDER — LEVETIRACETAM 500 MG PO TABS: 500.0000 mg | ORAL_TABLET | Freq: Two times a day (BID) | ORAL | 0 refills | Status: AC

## 2021-12-28 NOTE — TOC Initial Note (Signed)
Transition of Care [facility]) - Initial/Assessment Note    Patient Details  Name: Elizabeth Crosby MRN: [id number] Date of Birth: 22-May-1945  Transition of Care [facility]) CM/SW Contact:    Geralynn Ochs, LCSW Phone Number: 12/28/2021, 1:21 PM  Clinical Narrative:          CSW notified by palliative NP that patient's family interested in returning to [facility], regardless of goals of care. CSW confirmed with [facility] that patient could return. CSW notified that patient and family would like to transition to hospice with return to SNF, but concerned as Medicaid is pending. CSW discussed concernw with [facility], who asked about a short rehab stint to work with speech therapy since she was just cleared for a diet to make sure she is eating OK, and then transition to hospice once that is cleared. [facility] [facility] is ok with admitting patient under Medicaid pending. CSW discussed with [name], who agreed with decision. CSW initiated insurance authorization and it was received. [facility] can admit tomorrow. CSW to follow.         Expected Discharge Plan: Skilled Nursing Facility Barriers to Discharge: Continued Medical Work up, Ship broker   Patient Goals and CMS Choice Patient states their goals for this hospitalization and ongoing recovery are:: transition back to [employer].gov Compare Post Acute Care list provided to:: Patient Represenative (must comment) Choice offered to / list presented to : Adult Children  Expected Discharge Plan and Services Expected Discharge Plan: [place] Acute Care Choice: [place] Living arrangements for the past 2 months: [place] Expected Discharge Date: 12/28/21                                    Prior Living Arrangements/Services Living arrangements for the past 2 months: [place] Lives with:: Facility Resident Patient language and need for  interpreter reviewed:: No Do you feel safe going back to the place where you live?: Yes      Need for Family Participation in Patient Care: Yes (Comment) Care giver support system in place?: Yes (comment)   Criminal Activity/Legal Involvement Pertinent to Current Situation/Hospitalization: No - Comment as needed  Activities of Daily Living      Permission Sought/Granted Permission sought to share information with : Facility Sport and exercise psychologist, Family Supports Permission granted to share information with : Yes, Verbal Permission Granted  Share Information with NAME: [name]  Permission granted to share info w AGENCY: [employer] granted to share info w Relationship: Daughter     Emotional Assessment   Attitude/Demeanor/Rapport: Unable to Assess Affect (typically observed): Unable to Assess Orientation: : Oriented to Self, Oriented to Place, Oriented to  Time, Oriented to Situation Alcohol / Substance Use: Not Applicable Psych Involvement: No (comment)  Admission diagnosis:  Seizure ([place]) [R56.9] Seizures ([place]) [R56.9] Acute respiratory failure, unspecified whether with hypoxia or hypercapnia (HCC) [J96.00] Patient Active Problem List   Diagnosis Date Noted   Obesity (BMI 30-39.9) 12/22/2021   Protein-calorie malnutrition, severe ([place]) 12/18/2021   Severe muscle deconditioning 12/18/2021   DNR (do not resuscitate) 12/18/2021   Pseudomonas pneumonia ([place]) 12/18/2021   Leukocytosis 12/17/2021   Seizures ([place]) 12/14/2021   History of endometrial cancer 12/14/2021   Anemia 12/14/2021   Acute renal failure superimposed on stage 4 chronic kidney disease ([place]) 10/06/2021   Accident due to  mechanical fall without injury 10/06/2021   Delirium    Respiratory distress    Gram-positive bacteremia    Acute metabolic encephalopathy 05/39/7673   Acute encephalopathy 11/18/2020   DM2 (diabetes mellitus, type 2) ([place]) 11/18/2020   HTN (hypertension) 11/18/2020   Left  lower lobe pneumonia 11/18/2020   History of heart valve abnormality 10/24/2017   Postmenopausal bleeding 09/30/2017   Pelvic mass 09/30/2017   Morbid obesity ([place]) 09/30/2017   Endometrial cancer ([place]) 09/30/2017   Pelvic mass in female 09/30/2017   Acute rhinitis 07/04/2013   Bronchitis, subacute 05/29/2013   GERD (gastroesophageal reflux disease) 05/29/2013   PCP:  [name], MD Pharmacy:   [id number] [id number]: [phone]: [phone]    Social Determinants of Health (SDOH) Interventions    Readmission Risk Interventions     No data to display

## 2021-12-28 NOTE — TOC Transition Note (Signed)
Transition of Care [facility]) - CM/SW Discharge Note   Patient Details  Name: Elizabeth Crosby MRN: [id number] Date of Birth: 1945/07/04  [facility] CM/SW Contact:  Geralynn Ochs, LCSW Phone Number: 12/28/2021, 1:23 PM   Clinical Narrative:   CSW sent discharge information to [facility] and confirmed they were ready to admit. CSW confirmed discharge with daughter, [name], she is in agreement. Transport arranged with [facility] for next available.  Nurse to call report to [phone], Room 305.    Final next level of care: Skilled Nursing Facility Barriers to Discharge: Barriers Resolved   Patient Goals and CMS Choice Patient states their goals for this hospitalization and ongoing recovery are:: transition back to [facility].gov Compare Post Acute Care list provided to:: Patient Represenative (must comment) Choice offered to / list presented to : Adult Children  Discharge Placement              Patient chooses bed at: [facility] Patient to be transferred to facility by: [place] Name of family member notified: [name] Patient and family notified of of transfer: 12/28/21  Discharge Plan and Services     Post Acute Care Choice: [place]                               Social Determinants of Health (SDOH) Interventions     Readmission Risk Interventions     No data to display

## 2021-12-28 NOTE — Progress Notes (Signed)
Physical Therapy Treatment Patient Details Name: Elizabeth Crosby MRN: [id number] DOB: 1945/08/31 Today's Date: 12/28/2021   History of Present Illness 76 yo female admitted from SNF with new onset seizure requiring intubation 9/15-9/18. MRI negative for acute findings.  PMH: CKD III, DM, GERD, anxiety, gout, HLD, HTN, and obesity.    PT Comments    Pt more alert, interactive, and in pleasant mood/good spirits however did have periods of emotional lability. Pt with improved ability to transfer to EOB and complete sit to stand. Pt was also able to begin taking steps with max directional verbal cues to sequence. Attempted to use RW however pt states "I fall with those things. I use a w/c because I can maneuver." Acute PT to cont to follow.    Recommendations for follow up therapy are one component of a multi-disciplinary discharge planning process, led by the attending physician.  Recommendations may be updated based on patient status, additional functional criteria and insurance authorization.  Follow Up Recommendations  Skilled nursing-short term rehab (<3 hours/day) (returning to [facility]) Can patient physically be transported by private vehicle: No   Assistance Recommended at Discharge Frequent or constant Supervision/Assistance  Patient can return home with the following     Equipment Recommendations  None recommended by PT    Recommendations for Other Services       Precautions / Restrictions Precautions Precautions: Fall Precaution Comments: seizure, emotionally labile Restrictions Weight Bearing Restrictions: No     Mobility  Bed Mobility Overal bed mobility: Needs Assistance Bed Mobility: Supine to Sit     Supine to sit: Mod assist, +2 for physical assistance     General bed mobility comments: with max directional verbal cues pt able to bring LEs off EOB and bring L UE across body to reach for R hand rail, HOB elevated, modA for trunk elevation and then to scoot  to EOB    Transfers Overall transfer level: Needs assistance Equipment used: 2 person hand held assist Transfers: Sit to/from Stand, Bed to chair/wheelchair/BSC Sit to Stand: Min assist, +2 physical assistance   Step pivot transfers: Mod assist, +2 physical assistance       General transfer comment: with verbal cues pt with good initiation and power up, verbal cues to stand up straight/keep head up. completed 2 sit to stands, increased time, when asked if she used a walker pt stated "I fall with those" therefore deferred to bilat HHA. with step by step cuing pt able to side step to HOB x 4 steps, 2nd time pt completed step pvt to chair however required assist to advance R LE and assist for weight shifting    Ambulation/Gait               General Gait Details: limited to step pvt to chair   Stairs             Wheelchair Mobility    Modified Rankin (Stroke Patients Only)       Balance Overall balance assessment: Needs assistance Sitting-balance support: Feet supported, No upper extremity supported Sitting balance-Leahy Scale: Fair     Standing balance support: Bilateral upper extremity supported Standing balance-Leahy Scale: Poor Standing balance comment: requires external assist                            Cognition Arousal/Alertness: Awake/alert Behavior During Therapy:  (emotionally labile) Overall Cognitive Status: Within Functional Limits for tasks assessed  General Comments: pt emotionally labile today but was able to follow commands, pt laughed and was able to carry on coversation but would then become teary        Exercises      General Comments General comments (skin integrity, edema, etc.): VSS      Pertinent Vitals/Pain Pain Assessment Pain Assessment: No/denies pain    Home Living                          Prior Function            PT Goals (current goals can now  be found in the care plan section) Progress towards PT goals: Progressing toward goals    Frequency    Min 2X/week      PT Plan Current plan remains appropriate    Co-evaluation              AM-PAC PT "6 Clicks" Mobility   Outcome Measure  Help needed turning from your back to your side while in a flat bed without using bedrails?: A Lot Help needed moving from lying on your back to sitting on the side of a flat bed without using bedrails?: A Lot Help needed moving to and from a bed to a chair (including a wheelchair)?: A Lot Help needed standing up from a chair using your arms (e.g., wheelchair or bedside chair)?: A Lot Help needed to walk in hospital room?: A Lot Help needed climbing 3-5 steps with a railing? : Total 6 Click Score: 11    End of Session Equipment Utilized During Treatment: Gait belt Activity Tolerance: Patient tolerated treatment well Patient left: in chair;with call bell/phone within reach;with chair alarm set Nurse Communication: Mobility status PT Visit Diagnosis: Other abnormalities of gait and mobility (R26.89);Muscle weakness (generalized) (M62.81)     Time: 0932-3557 PT Time Calculation (min) (ACUTE ONLY): 19 min  Charges:  $Therapeutic Activity: 8-22 mins                     Kittie Plater, PT, DPT Acute Rehabilitation Services Secure chat preferred Office #: [phone]    Elizabeth Crosby 12/28/2021, 11:57 AM

## 2021-12-28 NOTE — Discharge Summary (Signed)
Physician Discharge Summary  Elizabeth Crosby UMP:[id number] DOB: 11/11/1945 DOA: 12/14/2021  PCP: [name], MD  Admit date: 12/14/2021 Discharge date: 12/28/2021 30 Day Unplanned Readmission Risk Score    Flowsheet Row ED to Hosp-Admission (Current) from 12/14/2021 in [facility]  30 Day Unplanned Readmission Risk Score (%) 24.67 Filed at 12/28/2021 0801       This score is the patient's risk of an unplanned readmission within 30 days of being discharged (0 -100%). The score is based on dignosis, age, lab data, medications, orders, and past utilization.   Low:  0-14.9   Medium: 15-21.9   High: 22-29.9   Extreme: 30 and above          Admitted From: Home Disposition: SNF  Recommendations for Outpatient Follow-up:  Follow up with PCP in 1-2 weeks Please obtain BMP/CBC in one week Follow-up with neurology in 2 to 4 weeks Please follow up with your PCP on the following pending results: Unresulted Labs (From admission, onward)    None         Home Health: None Equipment/Devices: None  Discharge Condition: Stable but has poor prognosis CODE STATUS: DNR Diet recommendation: Dysphagia 3 diet  Subjective: Seen and examined.  Lethargic as usual but easily arousable and when awake, fully oriented.  No complaints.  Brief/Interim Summary: 76 year old female with past medical history of CKD stage IV, hypertension, diabetes mellitus type 2, poor dentition, recent discharge from the hospital on 10/09/2021 for staph  lugdunensis  bacteremia was brought to hospital for acute encephalopathy and not following commands.  In route to hospital she was noted to have seizure like activity and was intubated for airway protection, code stroke was called, CT of the head was negative and was felt to be postictal from seizure, she was loaded with Keppra and transferred to [facility] for long-term EEG.  MRI of the brain showed no acute intracranial normality and T2  hyperintensity. Significant Events: 9/15 admitted with working diagnosis of seizure.  Seized in route to the emergency room and was unable to protect airway on arrival.  Intubated.  Code stroke called.  CTH negative for stroke.   Loaded with IV Keppra, transferred to [facility] for MRI of brain and LTM-EEG monitoring. 9/17: MRI completed with areas of chronic siderosis felt possibly secondary to remote hypoxic ischemic event.   9/17 Echocardiogram with EF 70 to 75% no regional wall abnormality grade 1 diastolic dysfunction RV systolic function normal left atrial size mildly dilated agitated saline contrast bubble negative 9/18:  Extubated.  Very awake postextubation.  Concern for need for reintubation, had goals of care discussion.  Patient DO NOT RESUSCITATE, no reintubation. 9/19 remaining weak, transfer to progressive     New onset seizures:  -No clear etiology, MRI brain showed no acute finding, but showed remote history of what looks like prior hypoxic insult -Neurology was consulted, patient given Keppra; continue Keppra twice a day, has been seizure-free for several days now. Neurology to follow-up as outpatient   Acute metabolic encephalopathy: -Continues to be lethargic but oriented x2-3 when she communicates.   Hyperkalemia: Resolved.   Acute respiratory failure with hypoxemia/Pseudomonas pneumonia -Patient was intubated to protect the airway; s/p extubation -Patient is DNR/DNI -Patient is currently n.p.o. as per speech therapy recommendations -Might need swallow evaluation next week if failed may be a candidate for PEG tube -continue medications through NG tube -Has been weaned off to room air - completed 10 days course of antibiotics/cefepime on  12/23/2021.   Nutrition:  -NG tube feedings, Core track placed 12/18/2021, she was followed by SLP, subsequently she was started on dysphagia 3 diet on 12/27/2021 and tube was taken out after having a discussion with the family and  palliative care..   Hypernatremia: -Was started on  free water per tube every 12 hours;sodium 147 on 12/24/2021.   Acute kidney injury on CKD stage IIIa -Baseline creatinine 1.1-1.2.  Creatinine now back to baseline.   Hypotension -Resolved, patient was started on midodrine which is currently on hold, blood pressure fairly controlled.   Hypertension -Antihypertensive medication on hold due to low blood pressure, we are discontinuing all antihypertensives at discharge.   Diabetes mellitus type 2: Due to hyperglycemia started on Semglee 10 units on 12/24/2021 and continues to be on SSI.  Blood sugar better.  Discharging on such.   GERD (gastroesophageal reflux disease) Continue PPI.   Normocytic anemia: -Hemoglobin is 8.6 today Follow CBC in a.m.   GOC: Seen by palliative care.  Patient made DNR.  No escalation of care and no rehospitalization has been decided between the family and the palliative care.  Discharge plan was discussed with patient and/or family member and they verbalized understanding and agreed with it.  Discharge Diagnoses:  Principal Problem:   Seizures ([place]) Active Problems:   Leukocytosis   GERD (gastroesophageal reflux disease)   Morbid obesity (HCC)   DM2 (diabetes mellitus, type 2) (HCC)   Acute metabolic encephalopathy   Acute renal failure superimposed on stage 4 chronic kidney disease (HCC)   History of endometrial cancer   Anemia   Protein-calorie malnutrition, severe (HCC)   Severe muscle deconditioning   DNR (do not resuscitate)   Pseudomonas pneumonia ([place])   Obesity (BMI 30-39.9)    Discharge Instructions   Allergies as of 12/28/2021       Reactions   Ozempic (0.25 Or 0.5 Mg-dose) [semaglutide(0.25 Or 0.'5mg'$ -dos)] Nausea And Vomiting   Amoxil [amoxicillin] Other (See Comments)   Cellulitis    Glucophage [metformin] Diarrhea   Invokana [canagliflozin] Other (See Comments)   UTI   Lipitor [atorvastatin] Other (See Comments)   Myalgias    Lyrica Cr [pregabalin Er] Other (See Comments)   Dizziness    Metformin And Related    Other Nausea And Vomiting   Pt states does not know drug but anesthesia drugs have made her have nausea and vomiting.    Sulfa Antibiotics Diarrhea   Trulicity [dulaglutide] Other (See Comments)   GI Upset   Potassium-containing Compounds Rash        Medication List     STOP taking these medications    amLODipine 5 MG tablet Commonly known as: NORVASC   diclofenac Sodium 1 % Gel Commonly known as: VOLTAREN   furosemide 20 MG tablet Commonly known as: LASIX   metoprolol succinate 25 MG 24 hr tablet Commonly known as: TOPROL-XL   ondansetron 4 MG disintegrating tablet Commonly known as: ZOFRAN-ODT   promethazine 25 MG tablet Commonly known as: PHENERGAN   tiZANidine 2 MG tablet Commonly known as: ZANAFLEX   traMADol 50 MG tablet Commonly known as: ULTRAM       TAKE these medications    acetaminophen 500 MG tablet Commonly known as: TYLENOL Take 1 tablet (500 mg total) by mouth every 4 (four) hours as needed for moderate pain, headache or fever.   ascorbic acid 500 MG tablet Commonly known as: VITAMIN C Take 500 mg by mouth daily.   aspirin EC 81 MG  tablet Take 81 mg by mouth daily.   Cholecalciferol 1.25 MG (50000 UT) capsule Take 50,000 Units by mouth daily. Every Monday   Vitamin D 50 MCG (2000 UT) Caps Take 2,000 Units by mouth daily.   Creon 36000 UNITS Cpep capsule Generic drug: lipase/protease/amylase Take 72,000 Units by mouth 3 (three) times daily with meals.   famotidine 20 MG tablet Commonly known as: PEPCID Take 20 mg by mouth 2 (two) times daily.   feeding supplement (PRO-STAT SUGAR FREE 64) Liqd Take 30 mLs by mouth in the morning and at bedtime.   ferrous sulfate 325 (65 FE) MG tablet Take 325 mg by mouth daily with breakfast.   ipratropium 0.06 % nasal spray Commonly known as: ATROVENT Place 2 sprays into both nostrils every 6 (six)  hours as needed for allergies.   levETIRAcetam 500 MG tablet Commonly known as: KEPPRA Take 1 tablet (500 mg total) by mouth 2 (two) times daily.   NOVOLOG FLEXPEN [place] Inject 10 Units into the skin in the morning, at noon, and at bedtime.   pantoprazole 40 MG tablet Commonly known as: PROTONIX Take 1 tablet (40 mg total) by mouth daily.   simvastatin 20 MG tablet Commonly known as: ZOCOR Take 20 mg by mouth every evening.   Tyler Aas FlexTouch 100 UNIT/ML FlexTouch Pen Generic drug: insulin degludec Inject 10 Units into the skin daily. What changed: how much to take   vitamin B-12 250 MCG tablet Commonly known as: CYANOCOBALAMIN Take 250 mcg by mouth daily.        Follow-up Information     [name], MD Follow up in 1 week(s).   Specialty: Family Medicine Contact information: [address] [place] [address] [phone]                Allergies  Allergen Reactions   Ozempic (0.25 Or 0.5 Mg-Dose) [Semaglutide(0.25 Or 0.'5mg'$ -Dos)] Nausea And Vomiting   Amoxil [Amoxicillin] Other (See Comments)    Cellulitis    Glucophage [Metformin] Diarrhea   Invokana [Canagliflozin] Other (See Comments)    UTI   Lipitor [Atorvastatin] Other (See Comments)    Myalgias   Lyrica Cr [Pregabalin Er] Other (See Comments)    Dizziness    Metformin And Related    Other Nausea And Vomiting    Pt states does not know drug but anesthesia drugs have made her have nausea and vomiting.    Sulfa Antibiotics Diarrhea   Trulicity [Dulaglutide] Other (See Comments)    GI Upset   Potassium-Containing Compounds Rash    Consultations: Neurology and palliative care   Procedures/Studies: DG Swallowing Func-Speech Pathology  Result Date: 12/27/2021 Table formatting from the original result was not included. Objective Swallowing Evaluation: Type of Study: MBS-Modified Barium Swallow Study  Patient Details Name: Elizabeth Crosby MRN: [id number] Date of Birth:  28-Jan-1946 Today's Date: 12/27/2021 Time: SLP Start Time (ACUTE ONLY): 1240 -SLP Stop Time (ACUTE ONLY): 1259 SLP Time Calculation (min) (ACUTE ONLY): 19 min Past Medical History: Past Medical History: Diagnosis Date  Anxiety   Arthritis   "knees are bone on bone and i see dr. Tonita Cong for the cortisone injections "   Cellulitis   hx  of in the legs   Chronic kidney disease (CKD), stage III (moderate) (HCC)   Complication of anesthesia   doesnt know the name of the anesthesia , but i was vomiting while i was still under   Diabetes mellitus without complication ([place])   type 2  GERD (gastroesophageal reflux disease)   Glaucoma   Gout   History of blood transfusion   as child after accident, after childbirth/hemorroid surgery  History of kidney stones   History of radiation therapy   vaginal brachytherapy  11/27/2017-12/25/2017   Dr Gery Pray  Hyperlipidemia   Hypertension   Leukocytes in urine   Lumbar back pain   Sleep apnea   non adherant with cpap device  Past Surgical History: Past Surgical History: Procedure Laterality Date  APPENDECTOMY  1972  CATARACT EXTRACTION W/PHACO Right 01/10/2014  Procedure: CATARACT EXTRACTION PHACO AND INTRAOCULAR LENS PLACEMENT ([place]);  Surgeon: Tonny Branch, MD;  Location: [facility];  Service: Ophthalmology;  Laterality: Right;  CDE:5.78  CATARACT EXTRACTION W/PHACO Left 01/27/2014  Procedure: CATARACT EXTRACTION PHACO AND INTRAOCULAR LENS PLACEMENT LEFT EYE CDE=5.75;  Surgeon: Tonny Branch, MD;  Location: [facility];  Service: Ophthalmology;  Laterality: Left;  CHOLECYSTECTOMY  1972  COLONOSCOPY WITH PROPOFOL N/A 09/01/2012  Procedure: COLONOSCOPY WITH PROPOFOL;  Surgeon: Garlan Fair, MD;  Location: [facility];  Service: Endoscopy;  Laterality: N/A;  KIDNEY STONE SURGERY    OMENTECTOMY  09/30/2017  Procedure: OMENTECTOMY;  Surgeon: [name], MD;  Location: [facility];  Service: Gynecology;;  ROBOTIC ASSISTED TOTAL HYSTERECTOMY WITH BILATERAL SALPINGO OOPHERECTOMY Bilateral 09/30/2017  Procedure:  ROBOTIC ASSISTED TOTAL HYSTERECTOMY WITH BILATERAL SALPINGO OOPHORECTOMY;  Surgeon: [name], MD;  Location: [facility];  Service: Gynecology;  Laterality: Bilateral; HPI: Pt is a 76 yo female admitted from SNF new onset seizure requiring intubation 9/15-9/18. MRI negative for acute findings. Pt seen by SLP in July 2023 for post-extubation dysphagia. MBS 10/05/21 showed aspiration (PAS 7) of thin liquids, recommending Dys 3 diet and nectar thick liquids. She was advanced to regular solids/thin liquids prior to d/c as she was able to consistently take small, single sips. PMH incluides: HTN, CKD, glaucoma, gout, anxiety, HLD  Subjective: alert, cooperative  Recommendations for follow up therapy are one component of a multi-disciplinary discharge planning process, led by the attending physician.  Recommendations may be updated based on patient status, additional functional criteria and insurance authorization. Assessment / Plan / Recommendation   12/27/2021   2:00 PM Clinical Impressions Clinical Impression Pt presents with a very mild oropharyngeal dysphagia. She has mildly reduced lingual propulsion for posterior transit as well as reduced base of tongue retraction and epiglottic inversion. This results in mild lingual and vallecular residue across liquid and solid consistencies, but she typically uses a second swallow spontaneously to reduce residue in her oral cavity as well as her pharynx. She maintained good airway protection across the duration of this study. Recommend that pt initiate Dys 3 diet and thin liquids. SLP Visit Diagnosis Dysphagia, oropharyngeal phase (R13.12) Impact on safety and function Mild aspiration risk     12/27/2021   2:00 PM Treatment Recommendations Treatment Recommendations Therapy as outlined in treatment plan below     12/27/2021   2:00 PM Prognosis Prognosis for Safe Diet Advancement Good   12/27/2021   2:00 PM Diet Recommendations SLP Diet Recommendations Dysphagia 3 (Mech soft) solids;Thin  liquid Liquid Administration via Cup;Straw Medication Administration Whole meds with liquid Compensations Minimize environmental distractions;Slow rate;Small sips/bites Postural Changes Seated upright at 90 degrees;Remain semi-upright after after feeds/meals (Comment)     12/27/2021   2:00 PM Other Recommendations Oral Care Recommendations Oral care BID Follow Up Recommendations Skilled nursing-short term rehab (<3 hours/day) Assistance recommended at discharge Frequent or constant Supervision/Assistance Functional Status Assessment Patient has had a recent decline  in their functional status and demonstrates the ability to make significant improvements in function in a reasonable and predictable amount of time.   12/27/2021   2:00 PM Frequency and Duration  Speech Therapy Frequency (ACUTE ONLY) min 2x/week Treatment Duration 2 weeks     12/27/2021   2:00 PM Oral Phase Oral Phase Impaired Oral - Thin Cup Reduced posterior propulsion;Lingual/palatal residue Oral - Thin Straw Reduced posterior propulsion;Lingual/palatal residue Oral - Puree Reduced posterior propulsion;Lingual/palatal residue Oral - Regular Reduced posterior propulsion;Lingual/palatal residue Oral - Pill Reduced posterior propulsion    12/27/2021   2:00 PM Pharyngeal Phase Pharyngeal Phase Impaired Pharyngeal- Thin Cup Reduced epiglottic inversion;Reduced tongue base retraction;Pharyngeal residue - valleculae Pharyngeal- Thin Straw Reduced epiglottic inversion;Reduced tongue base retraction;Pharyngeal residue - valleculae Pharyngeal- Puree Reduced epiglottic inversion;Reduced tongue base retraction;Pharyngeal residue - valleculae Pharyngeal- Regular Reduced epiglottic inversion;Reduced tongue base retraction;Pharyngeal residue - valleculae Pharyngeal- Pill Reduced epiglottic inversion;Reduced tongue base retraction    12/27/2021   2:00 PM Cervical Esophageal Phase  Cervical Esophageal Phase [facility] Osie Bond., M.A. CCC-SLP Acute Rehabilitation Services Office  [phone] Secure chat preferred 12/27/2021, 2:59 PM                     DG CHEST PORT 1 VIEW  Result Date: 12/22/2021 CLINICAL DATA:  Pneumonia. EXAM: PORTABLE CHEST 1 VIEW COMPARISON:  December 16, 2021 FINDINGS: Feeding catheter collimated off the image. Right PICC line tip at the expected location of the cavoatrial junction. Interval extubation. Normal cardiac silhouette. No evidence of lobar consolidation, pleural effusion or pneumothorax. IMPRESSION: 1. No evidence of acute cardiopulmonary disease. 2. Interval extubation. Electronically Signed   By: Fidela Salisbury M.D.   On: 12/22/2021 17:51   [place] EKG SITE RITE  Result Date: 12/19/2021 If Site Rite image not attached, placement could not be confirmed due to current cardiac rhythm.  DG Abd Portable 1V  Result Date: 12/18/2021 CLINICAL DATA:  Feeding tube placement EXAM: PORTABLE ABDOMEN - 1 VIEW COMPARISON:  12/14/2021 FINDINGS: Feeding tube tip in the right upper quadrant, likely in the distal stomach or proximal duodenum. Nonobstructive bowel gas pattern. Prior cholecystectomy. IMPRESSION: Feeding tube tip in the distal stomach or proximal duodenum. Electronically Signed   By: Rolm Baptise M.D.   On: 12/18/2021 10:47   ECHOCARDIOGRAM COMPLETE BUBBLE STUDY  Result Date: 12/16/2021    ECHOCARDIOGRAM REPORT   Patient Name:   Elizabeth Crosby Date of Exam: 12/16/2021 Medical Rec #:  [id number]      Height:       61.0 in Accession #:    [id number]     Weight:       188.9 lb Date of Birth:  08-Nov-1945      BSA:          1.844 m Patient Age:    28 years       BP:           138/38 mmHg Patient Gender: F              HR:           65 bpm. Exam Location:  Inpatient Procedure: 2D Echo, Cardiac Doppler, Color Doppler and Saline Contrast Bubble            Study Indications:    Bradycardia                 Bundle branch block  History:        Patient has prior history of  Echocardiogram examinations, most                 recent 10/03/2021. Risk  Factors:Diabetes and Hypertension. GERD.  Sonographer:    Clayton Lefort RDCS (AE) Referring Phys: [id number] Virgina Evener SOMMER  Sonographer Comments: Echo performed with patient supine and on artificial respirator. IMPRESSIONS  1. Left ventricular ejection fraction, by estimation, is 70 to 75%. The left ventricle has hyperdynamic function. The left ventricle has no regional wall motion abnormalities. There is mild concentric left ventricular hypertrophy. Left ventricular diastolic parameters are consistent with Grade I diastolic dysfunction (impaired relaxation).  2. Right ventricular systolic function is normal. The right ventricular size is normal. Tricuspid regurgitation signal is inadequate for assessing PA pressure.  3. Left atrial size was mildly dilated.  4. The mitral valve is normal in structure. Mild mitral valve regurgitation. Moderate mitral annular calcification.  5. The aortic valve is tricuspid. There is mild calcification of the aortic valve. Aortic valve regurgitation is not visualized. Aortic valve sclerosis is present, with no evidence of aortic valve stenosis.  6. Agitated saline contrast bubble study was negative, with no evidence of any right-to-left interatrial shunt, but there is an appearance of "negative contrast" that may represent left to right shunt across a small secundum atrial septal defect. . Comparison(s): Increase flow velocities are seen across all cardiac valves consistent with high cardiac output/hyperdynamic state (e.g. anemia, thyrotoxicosis, AV fistula, etc.). Conclusion(s)/Recommendation(s): If there is clinical suspicion for atrial septal defect, consider TEE. If present, the ASD is likely to be small, based on absence of right heart chamber enlargement and normal Qp:Qs. FINDINGS  Left Ventricle: Left ventricular ejection fraction, by estimation, is 70 to 75%. The left ventricle has hyperdynamic function. The left ventricle has no regional wall motion abnormalities. The left  ventricular internal cavity size was normal in size. There is mild concentric left ventricular hypertrophy. Left ventricular diastolic parameters are consistent with Grade I diastolic dysfunction (impaired relaxation). Right Ventricle: The right ventricular size is normal. No increase in right ventricular wall thickness. Right ventricular systolic function is normal. Tricuspid regurgitation signal is inadequate for assessing PA pressure. Left Atrium: Left atrial size was mildly dilated. Right Atrium: Right atrial size was normal in size. Pericardium: There is no evidence of pericardial effusion. Mitral Valve: The mitral valve is normal in structure. Moderate mitral annular calcification. Mild mitral valve regurgitation. MV peak gradient, 6.9 mmHg. The mean mitral valve gradient is 2.0 mmHg. Tricuspid Valve: The tricuspid valve is normal in structure. Tricuspid valve regurgitation is not demonstrated. Aortic Valve: The aortic valve is tricuspid. There is mild calcification of the aortic valve. Aortic valve regurgitation is not visualized. Aortic valve sclerosis is present, with no evidence of aortic valve stenosis. Aortic valve mean gradient measures 13.0 mmHg. Aortic valve peak gradient measures 24.2 mmHg. Aortic valve area, by VTI measures 2.23 cm. Pulmonic Valve: The pulmonic valve was normal in structure. Pulmonic valve regurgitation is not visualized. Aorta: The aortic root and ascending aorta are structurally normal, with no evidence of dilitation. IAS/Shunts: No atrial level shunt detected by color flow Doppler. Agitated saline contrast was given intravenously to evaluate for intracardiac shunting. Agitated saline contrast bubble study was negative, with no evidence of any right to left interatrial shunt.  LEFT VENTRICLE PLAX 2D LVIDd:         5.24 cm   Diastology LVIDs:         2.60 cm   LV e' medial:    5.98 cm/s  LV PW:         1.30 cm   LV E/e' medial:  14.7 LV IVS:        1.30 cm   LV e' lateral:   6.20  cm/s LVOT diam:     1.80 cm   LV E/e' lateral: 14.1 LV SV:         121 LV SV Index:   65 LVOT Area:     2.54 cm  RIGHT VENTRICLE          IVC RV Basal diam:  2.50 cm  IVC diam: 1.60 cm RVOT diam:      2.08 cm TAPSE (M-mode): 2.0 cm LEFT ATRIUM         Index LA diam:    3.90 cm 2.12 cm/m  AORTIC VALVE                     PULMONIC VALVE AV Area (Vmax):    2.11 cm      RVOT Peak grad: 10 mmHg AV Area (Vmean):   2.22 cm AV Area (VTI):     2.23 cm AV Vmax:           246.00 cm/s AV Vmean:          168.000 cm/s AV VTI:            0.541 m AV Peak Grad:      24.2 mmHg AV Mean Grad:      13.0 mmHg LVOT Vmax:         203.70 cm/s LVOT Vmean:        146.432 cm/s LVOT VTI:          0.474 m LVOT/AV VTI ratio: 0.88  AORTA Ao Root diam: 2.90 cm Ao Asc diam:  3.20 cm MITRAL VALVE MV Area (PHT): 1.67 cm     SHUNTS MV Peak grad:  6.9 mmHg     Systemic VTI:  0.47 m MV Mean grad:  2.0 mmHg     Systemic Diam: 1.80 cm MV Vmax:       1.31 m/s     Pulmonic VTI:  0.374 m MV Vmean:      73.5 cm/s    Pulmonic Diam: 2.08 cm MV Decel Time: 454 msec     Qp/Qs:         1.06 MV E velocity: 87.70 cm/s MV A velocity: 129.00 cm/s MV E/A ratio:  0.68 Mihai Croitoru MD Electronically signed by Sanda Klein MD Signature Date/Time: 12/16/2021/3:00:22 PM    Final    DG CHEST PORT 1 VIEW  Result Date: 12/16/2021 CLINICAL DATA:  Status post intubation. EXAM: PORTABLE CHEST 1 VIEW COMPARISON:  12/14/2021 FINDINGS: ETT tip is stable above the carina. There is a enteric tube with tip and side port below the GE junction. Right arm PICC line tip is at the cavoatrial junction. Heart size and mediastinal contours are stable. No pleural effusion, interstitial edema or airspace opacities. IMPRESSION: 1. Stable support apparatus. 2. No acute cardiopulmonary abnormalities. Electronically Signed   By: Kerby Moors M.D.   On: 12/16/2021 10:12   MR BRAIN WO CONTRAST  Result Date: 12/16/2021 CLINICAL DATA:  Altered mental status EXAM: MRI HEAD WITHOUT  CONTRAST TECHNIQUE: Multiplanar, multiecho pulse sequences of the brain and surrounding structures were obtained without intravenous contrast. COMPARISON:  11/19/2020 FINDINGS: Brain: No acute infarct, mass effect or extra-axial collection. No acute or chronic hemorrhage. Unchanged hyperintense T2-weighted signal within the bilateral perirolandic white matter.  There is also chronic siderosis over both hemispheres in this region. The midline structures are normal. Vascular: Major flow voids are preserved. Skull and upper cervical spine: Normal calvarium and skull base. Visualized upper cervical spine and soft tissues are normal. Sinuses/Orbits:No paranasal sinus fluid levels or advanced mucosal thickening. No mastoid or middle ear effusion. Normal orbits. IMPRESSION: 1. No acute intracranial abnormality. 2. Unchanged bilateral perirolandic white matter hyperintense T2-weighted signal and chronic siderosis over both hemispheres. This may be a sequela of a remote hypoxic ischemic event. Electronically Signed   By: Ulyses Jarred M.D.   On: 12/16/2021 02:53   [place] EKG SITE RITE  Result Date: 12/15/2021 If Site Rite image not attached, placement could not be confirmed due to current cardiac rhythm.  Overnight EEG with video  Result Date: 12/15/2021 Lora Havens, MD     12/16/2021  7:58 AM Patient Name: Elizabeth Crosby MRN: [id number] Epilepsy Attending: Lora Havens Referring Physician/Provider: Donnetta Simpers, MD Duration: 12/14/2021 2325 to 12/15/2021 2335 Patient history: 77 y.o. female were with seizures and AMS. EEG to evaluate for seizure Level of alertness: Awake, asleep AEDs during EEG study: LEV Technical aspects: This EEG study was done with scalp electrodes positioned according to the 10-20 International system of electrode placement. Electrical activity was reviewed with band pass filter of 1-'70Hz'$ , sensitivity of 7 uV/mm, display speed of 68m/sec with a '60Hz'$  notched filter applied as  appropriate. EEG data were recorded continuously and digitally stored.  Video monitoring was available and reviewed as appropriate. Description: No posterior dominant rhythm was seen. Sleep was characterized by vertex waves, sleep spindles (12 to 14 Hz), maximal frontocentral region. EEG showed continuous generalized 3 to 6 Hz theta-delta slowing. Hyperventilation and photic stimulation were not performed.   ABNORMALITY - Continuous slow, generalized IMPRESSION: This study is suggestive of moderate diffuse encephalopathy, nonspecific etiology. No seizures or epileptiform discharges were seen throughout the recording. PLora Havens  Portable Chest x-ray  Result Date: 12/14/2021 CLINICAL DATA:  Intubated EXAM: PORTABLE CHEST 1 VIEW COMPARISON:  12/14/2021 FINDINGS: Single frontal view of the chest demonstrates endotracheal tube overlying tracheal air column tip just below thoracic inlet. Enteric catheter passes below diaphragm, tip excluded by collimation. Cardiac silhouette is unremarkable. No airspace disease, effusion, or pneumothorax. No acute bony abnormalities. IMPRESSION: 1. Support devices as above. 2. No acute airspace disease. Electronically Signed   By: MRanda NgoM.D.   On: 12/14/2021 22:15   CT ANGIO HEAD NECK W WO CM W PERF (CODE STROKE)  Result Date: 12/14/2021 CLINICAL DATA:  Acute neuro deficit. EXAM: CT ANGIOGRAPHY HEAD AND NECK CT PERFUSION BRAIN TECHNIQUE: Multidetector CT imaging of the head and neck was performed using the standard protocol during bolus administration of intravenous contrast. Multiplanar CT image reconstructions and MIPs were obtained to evaluate the vascular anatomy. Carotid stenosis measurements (when applicable) are obtained utilizing NASCET criteria, using the distal internal carotid diameter as the denominator. Multiphase CT imaging of the brain was performed following IV bolus contrast injection. Subsequent parametric perfusion maps were calculated using  RAPID software. RADIATION DOSE REDUCTION: This exam was performed according to the departmental dose-optimization program which includes automated exposure control, adjustment of the mA and/or kV according to patient size and/or use of iterative reconstruction technique. CONTRAST:  1025mOMNIPAQUE IOHEXOL 350 MG/ML SOLN COMPARISON:  CT head 12/14/2021 FINDINGS: CT HEAD FINDINGS Brain: No evidence of acute infarction, hemorrhage, hydrocephalus, extra-axial collection or mass lesion/mass effect. Vascular: Negative for hyperdense vessel Skull:  Negative Sinuses/Orbits: Retention cyst left maxillary sinus. Bilateral cataract extraction. Other: Patient is intubated.  NG tube in place. ASPECTS ([place] Stroke Program Early CT Score) - Ganglionic level infarction (caudate, lentiform nuclei, internal capsule, insula, M1-M3 cortex): 7 - Supraganglionic infarction (M4-M6 cortex): 3 Total score (0-10 with 10 being normal): 10 Review of the MIP images confirms the above findings CTA NECK FINDINGS Aortic arch: Negative aortic arch. Proximal great vessels patent. Left vertebral artery branch arising from the aortic arch and the left subclavian artery. Right carotid system: Right carotid widely patent. Negative for atherosclerotic disease or dissection. Mild fusiform dilatation right internal carotid artery below the skull base measuring approximately 6.3 mm in diameter. Left carotid system: Large calcification proximal left internal carotid artery. This is narrowing of the lumen by approximately 15% diameter stenosis. Fusiform dilatation distal left internal carotid artery measuring 6.5 mm in diameter. Vertebral arteries: Mild atherosclerotic calcification origin of right vertebral artery with mild stenosis. Remainder of the right internal carotid artery is patent to the skull base. Dual origin left vertebral artery from the arch and left subclavian artery. No significant left vertebral stenosis. Skeleton: No acute skeletal  abnormality Other neck: Heterogeneous enhancement of the thyroid. 3 mm calcification left thyroid. Mild thyroid enlargement without focal nodule. Recommend thyroid ultrasound (Ref: J Am Coll Radiol. 2015 Feb;12(2): 143-50). Upper chest: Lung apices clear bilaterally Review of the MIP images confirms the above findings CTA HEAD FINDINGS Anterior circulation: Mild atherosclerotic calcification in the cavernous carotid bilaterally without stenosis. Anterior and middle cerebral arteries patent without stenosis or large vessel occlusion Posterior circulation: Left vertebral artery dominant and patent to the basilar. Left PICA patent. Right PICA patent. Hypoplastic distal right vertebral artery with mild contribution to the basilar. Basilar patent. Superior cerebellar and posterior cerebral arteries patent bilaterally patent posterior communicating arteries bilaterally. Venous sinuses: Normal venous enhancement Anatomic variants: None Review of the MIP images confirms the above findings CT Brain Perfusion Findings: ASPECTS: 10 CBF (<30%) Volume: 19m Perfusion (Tmax>6.0s) volume: 07mMismatch Volume: 77m68mnfarction Location:None IMPRESSION: 1. CT perfusion negative for acute infarct or ischemia. 2. Repeat head CT negative.  Aspects 10 3. No significant carotid or vertebral artery stenosis in the neck. Atherosclerotic calcification left carotid bulb without significant stenosis 4. Mild fusiform dilatation of the internal carotid artery below the skull base bilaterally. Negative for dissection. Electronically Signed   By: ChaFranchot GalloD.   On: 12/14/2021 15:44   DG Abd 1 View  Result Date: 12/14/2021 CLINICAL DATA:  Orogastric tube placement. EXAM: ABDOMEN - 1 VIEW COMPARISON:  None Available. FINDINGS: Distal tip of nasogastric tube is seen in expected position of distal stomach. IMPRESSION: Distal tip of nasogastric tube seen in expected position of distal stomach. Electronically Signed   By: JamMarijo ConceptionD.    On: 12/14/2021 15:13   DG Chest Portable 1 View  Result Date: 12/14/2021 CLINICAL DATA:  Endotracheal tube. EXAM: PORTABLE CHEST 1 VIEW COMPARISON:  October 04, 2021. FINDINGS: Stable cardiomediastinal silhouette. Endotracheal tube is in grossly good position. Nasogastric tube is seen entering stomach. Lungs are clear. Bony thorax is unremarkable. IMPRESSION: Endotracheal and nasogastric tubes are in grossly good position. No acute cardiopulmonary abnormality is noted. Electronically Signed   By: JamMarijo ConceptionD.   On: 12/14/2021 15:13   CT HEAD CODE STROKE WO CONTRAST  Result Date: 12/14/2021 CLINICAL DATA:  Code stroke.  Acute neuro deficit. EXAM: CT HEAD WITHOUT CONTRAST TECHNIQUE: Contiguous axial images were obtained from  the base of the skull through the vertex without intravenous contrast. RADIATION DOSE REDUCTION: This exam was performed according to the departmental dose-optimization program which includes automated exposure control, adjustment of the mA and/or kV according to patient size and/or use of iterative reconstruction technique. COMPARISON:  CT head 10/03/2021 FINDINGS: Brain: No evidence of acute infarction, hemorrhage, hydrocephalus, extra-axial collection or mass lesion/mass effect. Vascular: Negative for hyperdense vessel Skull: Negative Sinuses/Orbits: Retention cyst left maxillary sinus. Remaining sinuses clear. Orbit obscured by motion. Other: None ASPECTS ([place] Stroke Program Early CT Score) - Ganglionic level infarction (caudate, lentiform nuclei, internal capsule, insula, M1-M3 cortex): 7 - Supraganglionic infarction (M4-M6 cortex): 3 Total score (0-10 with 10 being normal): 10 IMPRESSION: 1. No acute intracranial abnormality 2. Motion degraded study 3. ASPECTS is 10 Electronically Signed   By: Franchot Gallo M.D.   On: 12/14/2021 14:45     Discharge Exam: Vitals:   12/28/21 0419 12/28/21 0735  BP: (!) 113/41 (!) 101/51  Pulse: 65 70  Resp: 16 16  Temp: 98.2 F (36.8  C) 99.2 F (37.3 C)  SpO2: 99% 99%   Vitals:   12/27/21 1800 12/27/21 2030 12/28/21 0419 12/28/21 0735  BP:  (!) 114/41 (!) 113/41 (!) 101/51  Pulse:  64 65 70  Resp: '17 16 16 16  '$ Temp:  98.1 F (36.7 C) 98.2 F (36.8 C) 99.2 F (37.3 C)  TempSrc:  Axillary Oral Oral  SpO2:  99% 99% 99%  Weight:      Height:        General: Pt is slightly lethargic but easily arousable, not in acute distress Cardiovascular: RRR, S1/S2 +, no rubs, no gallops Respiratory: CTA bilaterally, no wheezing, no rhonchi Abdominal: Soft, NT, ND, bowel sounds + Extremities: no edema, no cyanosis    The results of significant diagnostics from this hospitalization (including imaging, microbiology, ancillary and laboratory) are listed below for reference.     Microbiology: No results found for this or any previous visit (from the past 240 hour(s)).   Labs: BNP (last 3 results) Recent Labs    10/03/21 1018  BNP 401.0*   Basic Metabolic Panel: Recent Labs  Lab 12/22/21 0640 12/22/21 0737 12/24/21 0606 12/24/21 1036 12/24/21 1528 12/24/21 1837 12/25/21 0444 12/25/21 1403 12/27/21 0455 12/27/21 1504  NA 148*  --  148* 147*  --   --  147*  --  145  --   K 5.0  --  5.7* 5.5*   < > 6.0* 5.4* 5.1 5.4* 5.2*  CL 118*  --  119* 121*  --   --  122*  --  113*  --   CO2 25  --  22 22  --   --  18*  --  22  --   GLUCOSE 163*  --  209* 192*  --   --  137*  --  157*  --   BUN 56*  --  53* 52*  --   --  52*  --  51*  --   CREATININE 1.03*  --  1.21* 1.10*  --   --  1.04*  --  0.96  --   CALCIUM 9.1  --  9.5 9.1  --   --  8.8*  --  9.4  --   MG  --  1.9  --   --   --   --   --   --   --   --   PHOS  --  2.7  --   --   --   --   --   --   --   --    < > =  values in this interval not displayed.   Liver Function Tests: Recent Labs  Lab 12/22/21 0640  AST 22  ALT 15  ALKPHOS 139*  BILITOT 0.5  PROT 5.2*  ALBUMIN 1.8*   No results for input(s): "LIPASE", "AMYLASE" in the last 168 hours. No  results for input(s): "AMMONIA" in the last 168 hours. CBC: Recent Labs  Lab 12/22/21 0640 12/22/21 0737 12/24/21 0606  WBC 10.0 10.0 11.7*  NEUTROABS  --  6.3 7.8*  HGB 8.5* 8.6* 8.4*  HCT 24.7* 25.1* 25.0*  MCV 95.4 95.8 96.9  PLT 163 168 193   Cardiac Enzymes: No results for input(s): "CKTOTAL", "CKMB", "CKMBINDEX", "TROPONINI" in the last 168 hours. BNP: Invalid input(s): "POCBNP" CBG: Recent Labs  Lab 12/27/21 2000 12/27/21 2028 12/28/21 0003 12/28/21 0420 12/28/21 0817  GLUCAP 68* 77 76 86 88   D-Dimer No results for input(s): "DDIMER" in the last 72 hours. Hgb A1c No results for input(s): "HGBA1C" in the last 72 hours. Lipid Profile No results for input(s): "CHOL", "HDL", "LDLCALC", "TRIG", "CHOLHDL", "LDLDIRECT" in the last 72 hours. Thyroid function studies No results for input(s): "TSH", "T4TOTAL", "T3FREE", "THYROIDAB" in the last 72 hours.  Invalid input(s): "FREET3" Anemia work up No results for input(s): "VITAMINB12", "FOLATE", "FERRITIN", "TIBC", "IRON", "RETICCTPCT" in the last 72 hours. Urinalysis    Component Value Date/Time   COLORURINE YELLOW 12/14/2021 2139   APPEARANCEUR HAZY (A) 12/14/2021 2139   LABSPEC 1.020 12/14/2021 2139   PHURINE 6.0 12/14/2021 2139   GLUCOSEU NEGATIVE 12/14/2021 2139   HGBUR NEGATIVE 12/14/2021 2139   BILIRUBINUR NEGATIVE 12/14/2021 2139   KETONESUR NEGATIVE 12/14/2021 2139   PROTEINUR >=300 (A) 12/14/2021 2139   NITRITE NEGATIVE 12/14/2021 2139   LEUKOCYTESUR TRACE (A) 12/14/2021 2139   Sepsis Labs Recent Labs  Lab 12/22/21 0640 12/22/21 0737 12/24/21 0606  WBC 10.0 10.0 11.7*   Microbiology No results found for this or any previous visit (from the past 240 hour(s)).   Time coordinating discharge: Over 30 minutes  SIGNED:   Darliss Cheney, MD  [facility] 12/28/2021, 10:09 AM *Please note that this is a verbal dictation therefore any spelling or grammatical errors are due to the "[place]" system interpretation. If 7PM-7AM, please contact night-coverage [email or web address]

## 2021-12-28 NOTE — NC FL2 (Signed)
[place] LEVEL OF CARE SCREENING TOOL     IDENTIFICATION  Patient Name: Elizabeth Crosby Birthdate: 11-05-1945 Sex: female Admission Date (Current Location): 12/14/2021  [facility] and [place] Number:  [employer] and Address:  [facility] [place]. [facility], [place] [address] [place], [place] [address]      Provider Number: [id number]  Attending Physician Name and Address:  Darliss Cheney, MD  Relative Name and Phone Number:       Current Level of Care: Hospital Recommended Level of Care: [place] Prior Approval Number:    Date Approved/Denied:   PASRR Number:    Discharge Plan: SNF    Current Diagnoses: Patient Active Problem List   Diagnosis Date Noted   Obesity (BMI 30-39.9) 12/22/2021   Protein-calorie malnutrition, severe ([place]) 12/18/2021   Severe muscle deconditioning 12/18/2021   DNR (do not resuscitate) 12/18/2021   Pseudomonas pneumonia ([place]) 12/18/2021   Leukocytosis 12/17/2021   Seizures ([place]) 12/14/2021   History of endometrial cancer 12/14/2021   Anemia 12/14/2021   Acute renal failure superimposed on stage 4 chronic kidney disease ([place]) 10/06/2021   Accident due to mechanical fall without injury 10/06/2021   Delirium    Respiratory distress    Gram-positive bacteremia    Acute metabolic encephalopathy 20/94/7096   Acute encephalopathy 11/18/2020   DM2 (diabetes mellitus, type 2) ([place]) 11/18/2020   HTN (hypertension) 11/18/2020   Left lower lobe pneumonia 11/18/2020   History of heart valve abnormality 10/24/2017   Postmenopausal bleeding 09/30/2017   Pelvic mass 09/30/2017   Morbid obesity ([place]) 09/30/2017   Endometrial cancer ([place]) 09/30/2017   Pelvic mass in female 09/30/2017   Acute rhinitis 07/04/2013   Bronchitis, subacute 05/29/2013   GERD (gastroesophageal reflux disease) 05/29/2013    Orientation RESPIRATION BLADDER Height & Weight     Self, Time, Situation, Place  Normal Incontinent Weight:  184 lb 1.4 oz (83.5 kg) Height:  '5\' 1"'$  (154.9 cm)  BEHAVIORAL SYMPTOMS/MOOD NEUROLOGICAL BOWEL NUTRITION STATUS    Convulsions/Seizures Incontinent Diet (see DC summary)  AMBULATORY STATUS COMMUNICATION OF NEEDS Skin   Extensive Assist Verbally Normal                       Personal Care Assistance Level of Assistance  Bathing, Feeding, Dressing Bathing Assistance: Maximum assistance Feeding assistance: Maximum assistance Dressing Assistance: Maximum assistance     Functional Limitations Info  Sight Sight Info: Impaired        SPECIAL CARE FACTORS FREQUENCY  PT (By licensed PT), OT (By licensed OT), Speech therapy     PT Frequency: 5x/wk OT Frequency: 5x/wk     Speech Therapy Frequency: 5x/wk      Contractures Contractures Info: Not present    Additional Factors Info  Code Status, Allergies, Insulin Sliding Scale Code Status Info: DNR Allergies Info: Ozempic (0.25 Or 0.5 Mg-dose) (Semaglutide(0.25 Or 0.'5mg'$ -dos)), Amoxil (Amoxicillin), Glucophage (Metformin), Invokana (Canagliflozin), Lipitor (Atorvastatin), Lyrica Cr (Pregabalin Er), Metformin And Related, Other, Sulfa Antibiotics, Trulicity (Dulaglutide), Potassium-containing Compounds   Insulin Sliding Scale Info: see DC summary       Current Medications (12/28/2021):  This is the current hospital active medication list Current Facility-Administered Medications  Medication Dose Route Frequency Provider Last Rate Last Admin   0.9 %  sodium chloride infusion  250 mL Intravenous Continuous Erick Colace, NP 10 mL/hr at 12/18/21 1817 250 mL at 12/18/21 1817   acetaminophen (TYLENOL) 160 MG/5ML solution 650 mg  650 mg Oral Q6H PRN Darliss Cheney, MD       atropine 1 MG/10ML injection 1 mg  1 mg Intravenous PRN Erick Colace, NP   0.5 mg at 12/15/21 0240   Chlorhexidine Gluconate Cloth 2 % PADS 6 each  6 each Topical Daily Charlynne Cousins, MD   6 each at 12/28/21 1013   docusate (COLACE) 50 MG/5ML liquid 100  mg  100 mg Oral BID Darliss Cheney, MD       heparin injection 5,000 Units  5,000 Units Subcutaneous Q8H Erick Colace, NP   5,000 Units at 12/28/21 0516   hydrALAZINE (APRESOLINE) injection 10 mg  10 mg Intravenous Q6H PRN Pahwani, Einar Grad, MD       insulin aspart (novoLOG) injection 0-15 Units  0-15 Units Subcutaneous Q4H Erick Colace, NP   2 Units at 12/27/21 1517   insulin detemir (LEVEMIR) injection 10 Units  10 Units Subcutaneous Daily Darliss Cheney, MD   10 Units at 12/28/21 1013   levETIRAcetam (KEPPRA) tablet 500 mg  500 mg Oral BID Darliss Cheney, MD   500 mg at 12/28/21 1013   LORazepam (ATIVAN) injection 2 mg  2 mg Intravenous PRN Erick Colace, NP       Oral care mouth rinse  15 mL Mouth Rinse PRN Erick Colace, NP       Oral care mouth rinse  15 mL Mouth Rinse 4 times per day Allie Bossier, MD   15 mL at 12/27/21 2034   pantoprazole (PROTONIX) EC tablet 40 mg  40 mg Oral Daily Darliss Cheney, MD   40 mg at 12/28/21 1013   polyethylene glycol (MIRALAX / GLYCOLAX) packet 17 g  17 g Per Tube Daily Erick Colace, NP   17 g at 12/25/21 0906   sodium chloride flush (NS) 0.9 % injection 10-40 mL  10-40 mL Intracatheter Q12H Erick Colace, NP   10 mL at 12/28/21 1014   sodium chloride flush (NS) 0.9 % injection 10-40 mL  10-40 mL Intracatheter PRN Erick Colace, NP         Discharge Medications: Please see discharge summary for a list of discharge medications.  Relevant Imaging Results:  Relevant Lab Results:   Additional Information SS#: [id number]  Geralynn Ochs, LCSW

## 2021-12-28 NOTE — Progress Notes (Signed)
Pt discharged to [facility]. Pt transported by [facility] @ 9:29 PM BP (!) 115/43 (BP Location: Left Arm)   Pulse 69   Temp 98.2 F (36.8 C) (Axillary)   Resp 13   Ht '5\' 1"'$  (1.549 m)   Wt 83.5 kg   SpO2 100%   BMI 34.78 kg/m  No c/o pain or discomfort at this time. Pt belongings sent with her.  No other needs at this time. Daytime called report. [name] 12/28/21 9:29 PM

## 2021-12-28 NOTE — Plan of Care (Signed)
  Problem: Education: Goal: Ability to describe self-care measures that may prevent or decrease complications (Diabetes Survival Skills Education) will improve Outcome: Adequate for Discharge Goal: Individualized Educational Video(s) Outcome: Adequate for Discharge   Problem: Coping: Goal: Ability to adjust to condition or change in health will improve Outcome: Adequate for Discharge   Problem: Fluid Volume: Goal: Ability to maintain a balanced intake and output will improve Outcome: Adequate for Discharge   Problem: Health Behavior/Discharge Planning: Goal: Ability to identify and utilize available resources and services will improve Outcome: Adequate for Discharge Goal: Ability to manage health-related needs will improve Outcome: Adequate for Discharge   Problem: Metabolic: Goal: Ability to maintain appropriate glucose levels will improve Outcome: Adequate for Discharge   Problem: Nutritional: Goal: Maintenance of adequate nutrition will improve Outcome: Adequate for Discharge Goal: Progress toward achieving an optimal weight will improve Outcome: Adequate for Discharge   Problem: Skin Integrity: Goal: Risk for impaired skin integrity will decrease Outcome: Adequate for Discharge   Problem: Tissue Perfusion: Goal: Adequacy of tissue perfusion will improve Outcome: Adequate for Discharge   Problem: Activity: Goal: Ability to tolerate increased activity will improve Outcome: Adequate for Discharge   Problem: Respiratory: Goal: Ability to maintain a clear airway and adequate ventilation will improve Outcome: Adequate for Discharge   Problem: Role Relationship: Goal: Method of communication will improve Outcome: Adequate for Discharge   Problem: Safety: Goal: Non-violent Restraint(s) Outcome: Adequate for Discharge   Problem: Education: Goal: Knowledge of General Education information will improve Description: Including pain rating scale, medication(s)/side  effects and non-pharmacologic comfort measures Outcome: Adequate for Discharge   Problem: Health Behavior/Discharge Planning: Goal: Ability to manage health-related needs will improve Outcome: Adequate for Discharge   Problem: Clinical Measurements: Goal: Ability to maintain clinical measurements within normal limits will improve Outcome: Adequate for Discharge Goal: Will remain free from infection Outcome: Adequate for Discharge Goal: Diagnostic test results will improve Outcome: Adequate for Discharge Goal: Respiratory complications will improve Outcome: Adequate for Discharge Goal: Cardiovascular complication will be avoided Outcome: Adequate for Discharge   Problem: Activity: Goal: Risk for activity intolerance will decrease Outcome: Adequate for Discharge   Problem: Nutrition: Goal: Adequate nutrition will be maintained Outcome: Adequate for Discharge   Problem: Coping: Goal: Level of anxiety will decrease Outcome: Adequate for Discharge   Problem: Elimination: Goal: Will not experience complications related to bowel motility Outcome: Adequate for Discharge Goal: Will not experience complications related to urinary retention Outcome: Adequate for Discharge   Problem: Pain Managment: Goal: General experience of comfort will improve Outcome: Adequate for Discharge   Problem: Safety: Goal: Ability to remain free from injury will improve Outcome: Adequate for Discharge   Problem: Skin Integrity: Goal: Risk for impaired skin integrity will decrease Outcome: Adequate for Discharge

## 2021-12-31 DIAGNOSIS — R569 Unspecified convulsions: Secondary | ICD-10-CM | POA: Diagnosis not present

## 2021-12-31 DIAGNOSIS — R1310 Dysphagia, unspecified: Secondary | ICD-10-CM | POA: Diagnosis not present

## 2021-12-31 DIAGNOSIS — G9341 Metabolic encephalopathy: Secondary | ICD-10-CM | POA: Diagnosis not present

## 2021-12-31 DIAGNOSIS — Z79899 Other long term (current) drug therapy: Secondary | ICD-10-CM | POA: Diagnosis not present

## 2022-01-02 DIAGNOSIS — E785 Hyperlipidemia, unspecified: Secondary | ICD-10-CM | POA: Diagnosis not present

## 2022-01-02 DIAGNOSIS — R569 Unspecified convulsions: Secondary | ICD-10-CM | POA: Diagnosis not present

## 2022-01-02 DIAGNOSIS — R5381 Other malaise: Secondary | ICD-10-CM | POA: Diagnosis not present

## 2022-01-02 DIAGNOSIS — I10 Essential (primary) hypertension: Secondary | ICD-10-CM | POA: Diagnosis not present

## 2022-01-02 DIAGNOSIS — E119 Type 2 diabetes mellitus without complications: Secondary | ICD-10-CM | POA: Diagnosis not present

## 2022-01-14 DIAGNOSIS — R569 Unspecified convulsions: Secondary | ICD-10-CM | POA: Diagnosis not present

## 2022-01-14 DIAGNOSIS — R1319 Other dysphagia: Secondary | ICD-10-CM | POA: Diagnosis not present

## 2022-01-16 DIAGNOSIS — R1319 Other dysphagia: Secondary | ICD-10-CM | POA: Diagnosis not present

## 2022-01-16 DIAGNOSIS — R569 Unspecified convulsions: Secondary | ICD-10-CM | POA: Diagnosis not present

## 2022-01-17 DIAGNOSIS — Z79899 Other long term (current) drug therapy: Secondary | ICD-10-CM | POA: Diagnosis not present

## 2022-01-17 DIAGNOSIS — I10 Essential (primary) hypertension: Secondary | ICD-10-CM | POA: Diagnosis not present

## 2022-01-17 DIAGNOSIS — E559 Vitamin D deficiency, unspecified: Secondary | ICD-10-CM | POA: Diagnosis not present

## 2022-01-17 DIAGNOSIS — R569 Unspecified convulsions: Secondary | ICD-10-CM | POA: Diagnosis not present

## 2022-01-17 DIAGNOSIS — K8681 Exocrine pancreatic insufficiency: Secondary | ICD-10-CM | POA: Diagnosis not present

## 2022-01-17 DIAGNOSIS — R1319 Other dysphagia: Secondary | ICD-10-CM | POA: Diagnosis not present

## 2022-01-21 DIAGNOSIS — R569 Unspecified convulsions: Secondary | ICD-10-CM | POA: Diagnosis not present

## 2022-01-21 DIAGNOSIS — R1319 Other dysphagia: Secondary | ICD-10-CM | POA: Diagnosis not present

## 2022-01-22 DIAGNOSIS — R1319 Other dysphagia: Secondary | ICD-10-CM | POA: Diagnosis not present

## 2022-01-22 DIAGNOSIS — R569 Unspecified convulsions: Secondary | ICD-10-CM | POA: Diagnosis not present

## 2022-01-24 DIAGNOSIS — R1319 Other dysphagia: Secondary | ICD-10-CM | POA: Diagnosis not present

## 2022-01-24 DIAGNOSIS — R569 Unspecified convulsions: Secondary | ICD-10-CM | POA: Diagnosis not present

## 2022-01-25 DIAGNOSIS — Z043 Encounter for examination and observation following other accident: Secondary | ICD-10-CM | POA: Diagnosis not present

## 2022-01-25 DIAGNOSIS — I10 Essential (primary) hypertension: Secondary | ICD-10-CM | POA: Diagnosis not present

## 2022-01-25 DIAGNOSIS — Z79899 Other long term (current) drug therapy: Secondary | ICD-10-CM | POA: Diagnosis not present

## 2022-01-27 NOTE — Progress Notes (Signed)
Radiation Oncology         [phone]) [phone] ________________________________  Name: Elizabeth Crosby MRN: [id number]  Date: 01/28/2022  DOB: 05/10/1945  Follow-Up Visit Note  CC: Default, Provider, MD  [name], MD  No diagnosis found.  Diagnosis:   Stage IB grade 1 Endometrioid Endometrial Adenocarcinoma (MSI stable, MMR normal)   Interval Since Last Radiation: 4 years, 1 month, and 4 days   Radiation treatment dates:  11/27/2017, 12/04/2017, 12/11/2017, 12/18/2017, 12/25/2017   Site/dose:   Vaginal Cuff / 30 Gy in 5 fractions  Narrative:  The patient returns today for routine annual follow-up, she was last seen here for follow-up on 01/29/2021. Since her last visit, the patient followed up with Dr. Delsa Sale on 08/08/21. During which time, the patient denied any symptoms concerning for disease recurrence and she was noted as NED on examination.          The patient unfortunately had several hospitalizations in the interval, detailed as follows:      --  10/03/21 through 10/09/21 admission: The patient was taken to ED via EMS after suffering a fall. Her husband also reported that she was acting somewhat delirious at the time of her fall. The patient's daughter who was present in the ED stated that the patient has mixed up her medications in the past which have caused her to slur her words.  Upon arrival to the ED, the patient was found to be in respiratory distress and was intubated for airway protection which took several attempts. She was also found to have staph lugdunensis bacteremia present on admission and aspiration pneumonia. She was treated with abx accordingly. CT of the head and cervical spine performed was negative for acute process. She was extubated on 10/05/2021 and transferred to [facility] on 10/06/2021. Her discharge diagnosis was acute metabolic encephalopathy in the setting of a suspected accidental medication mix up vs bacteremia. She was discharged to a SNF on 10/09/21 in  stable condition with a PICC line in place for IV abx. (Chest x-ray on 07/11 also showed worsening aeration of both lung bases favored to be enlarging bilateral effusions).  The patient met with infections disease following discharge for further management of bacteremia. Maxillofacial CT ordered to rule our dental infection showed dental carries but no evidence of associated bony or soft tissue infection.  -- 12/14/21 - 12/28/21 admission:   ***                   Allergies:  is allergic to ozempic (0.25 or 0.5 mg-dose) [semaglutide(0.25 or 0.1m-dos)], amoxil [amoxicillin], glucophage [metformin], invokana [canagliflozin], lipitor [atorvastatin], lyrica cr [pregabalin er], metformin and related, other, sulfa antibiotics, trulicity [dulaglutide], and potassium-containing compounds.  Meds: Current Outpatient Medications  Medication Sig Dispense Refill   acetaminophen (TYLENOL) 500 MG tablet Take 1 tablet (500 mg total) by mouth every 4 (four) hours as needed for moderate pain, headache or fever.     Amino Acids-Protein Hydrolys (FEEDING SUPPLEMENT, PRO-STAT SUGAR FREE 64,) LIQD Take 30 mLs by mouth in the morning and at bedtime.     ascorbic acid (VITAMIN C) 500 MG tablet Take 500 mg by mouth daily.     aspirin EC 81 MG tablet Take 81 mg by mouth daily.     Cholecalciferol (VITAMIN D) 50 MCG (2000 UT) CAPS Take 2,000 Units by mouth daily.     Cholecalciferol 1.25 MG (50000 UT) capsule Take 50,000 Units by mouth daily. Every Monday     famotidine (PEPCID) 20  MG tablet Take 20 mg by mouth 2 (two) times daily.     ferrous sulfate 325 (65 FE) MG tablet Take 325 mg by mouth daily with breakfast.     Insulin Aspart (NOVOLOG FLEXPEN [place]) Inject 10 Units into the skin in the morning, at noon, and at bedtime.     insulin degludec (TRESIBA FLEXTOUCH) 100 UNIT/ML FlexTouch Pen Inject 10 Units into the skin daily. 3 mL 0   ipratropium (ATROVENT) 0.06 % nasal spray Place 2 sprays into both nostrils every 6  (six) hours as needed for allergies.     levETIRAcetam (KEPPRA) 500 MG tablet Take 1 tablet (500 mg total) by mouth 2 (two) times daily. 60 tablet 0   lipase/protease/amylase (CREON) 36000 UNITS CPEP capsule Take 72,000 Units by mouth 3 (three) times daily with meals.     pantoprazole (PROTONIX) 40 MG tablet Take 1 tablet (40 mg total) by mouth daily. 30 tablet 0   simvastatin (ZOCOR) 20 MG tablet Take 20 mg by mouth every evening.     vitamin B-12 (CYANOCOBALAMIN) 250 MCG tablet Take 250 mcg by mouth daily.     No current facility-administered medications for this encounter.    Physical Findings: The patient is in no acute distress. Patient is alert and oriented.  vitals were not taken for this visit. .  No significant changes. Lungs are clear to auscultation bilaterally. Heart has regular rate and rhythm. No palpable cervical, supraclavicular, or axillary adenopathy. Abdomen soft, non-tender, normal bowel sounds.  On pelvic examination the external genitalia were unremarkable. A speculum exam was performed. There are no mucosal lesions noted in the vaginal vault. A Pap smear was obtained of the proximal vagina. On bimanual and rectovaginal examination there were no pelvic masses appreciated. ***  Lab Findings: Lab Results  Component Value Date   WBC 11.7 (H) 12/24/2021   HGB 8.4 (L) 12/24/2021   HCT 25.0 (L) 12/24/2021   MCV 96.9 12/24/2021   PLT 193 12/24/2021    Radiographic Findings: No results found.  Impression: Stage IB grade 1 Endometrioid Endometrial Adenocarcinoma (MSI stable, MMR normal)   The patient is recovering from the effects of radiation.  ***  Plan:  ***   *** minutes of total time was spent for this patient encounter, including preparation, face-to-face counseling with the patient and coordination of care, physical exam, and documentation of the encounter. ____________________________________  Blair Promise, PhD, MD  This document serves as a record of  services personally performed by Gery Pray, MD. It was created on his behalf by Roney Mans, a trained medical scribe. The creation of this record is based on the scribe's personal observations and the provider's statements to them. This document has been checked and approved by the attending provider.

## 2022-01-28 ENCOUNTER — Ambulatory Visit
Admission: RE | Admit: 2022-01-28 | Discharge: 2022-01-28 | Disposition: A | Discharge status: Home / Self Care | Payer: Medicare Other | Source: Ambulatory Visit | Attending: Radiation Oncology | Admitting: Radiation Oncology

## 2022-01-28 ENCOUNTER — Telehealth: Payer: Self-pay | Admitting: *Deleted

## 2022-01-28 VITALS — BP 143/65 | HR 84 | Temp 97.5°F | Resp 18 | Ht 61.0 in | Wt 164.0 lb

## 2022-01-28 DIAGNOSIS — Z7982 Long term (current) use of aspirin: Secondary | ICD-10-CM | POA: Insufficient documentation

## 2022-01-28 DIAGNOSIS — G9341 Metabolic encephalopathy: Secondary | ICD-10-CM | POA: Insufficient documentation

## 2022-01-28 DIAGNOSIS — C541 Malignant neoplasm of endometrium: Secondary | ICD-10-CM

## 2022-01-28 DIAGNOSIS — Z923 Personal history of irradiation: Secondary | ICD-10-CM | POA: Insufficient documentation

## 2022-01-28 DIAGNOSIS — Z79899 Other long term (current) drug therapy: Secondary | ICD-10-CM | POA: Diagnosis not present

## 2022-01-28 DIAGNOSIS — R1319 Other dysphagia: Secondary | ICD-10-CM | POA: Diagnosis not present

## 2022-01-28 DIAGNOSIS — Z8542 Personal history of malignant neoplasm of other parts of uterus: Secondary | ICD-10-CM | POA: Diagnosis present

## 2022-01-28 DIAGNOSIS — R569 Unspecified convulsions: Secondary | ICD-10-CM | POA: Diagnosis not present

## 2022-01-28 NOTE — Telephone Encounter (Signed)
CALLED PATIENT PER NURSE REQUEST TO SEE IF SHE IS COMING TODAY OR WOULD LIKE TO RESCHEDULE, LVM FOR A RETURN CALL

## 2022-01-28 NOTE — Progress Notes (Signed)
Elizabeth Crosby is here today for follow up post radiation to the pelvic.  They completed their radiation on: 12/25/17  Does the patient complain of any of the following:  Pain:has lower back pain occasionally Abdominal bloating: no Diarrhea/Constipation: she reports having diarrhea when she eats and also avoids vegetables due to gas Nausea/Vomiting: no Vaginal Discharge: no Blood in Urine or Stool: no Urinary Issues (dysuria/incomplete emptying/ incontinence/ increased frequency/urgency): no Does patient report using vaginal dilator 2-3 times a week and/or sexually active 2-3 weeks: no Post radiation skin changes: no   Additional comments if applicable: Patient is currently a resident [place].  She is here with her daughter.  She reports having a fall last Thursday. She reports she hit the back of her head. Her daughter reports [name] is confused at times.  BP (!) 143/65 (BP Location: Left Arm, Patient Position: Sitting)   Pulse 84   Temp (!) 97.5 F (36.4 C) (Oral)   Resp 18   Ht '5\' 1"'$  (1.549 m)   Wt 164 lb (74.4 kg)   SpO2 100%   BMI 30.99 kg/m

## 2022-01-29 DIAGNOSIS — R1319 Other dysphagia: Secondary | ICD-10-CM | POA: Diagnosis not present

## 2022-01-29 DIAGNOSIS — R569 Unspecified convulsions: Secondary | ICD-10-CM | POA: Diagnosis not present

## 2022-02-01 DIAGNOSIS — D511 Vitamin B12 deficiency anemia due to selective vitamin B12 malabsorption with proteinuria: Secondary | ICD-10-CM | POA: Diagnosis not present

## 2022-02-01 DIAGNOSIS — R569 Unspecified convulsions: Secondary | ICD-10-CM | POA: Diagnosis not present

## 2022-02-01 DIAGNOSIS — K8681 Exocrine pancreatic insufficiency: Secondary | ICD-10-CM | POA: Diagnosis not present

## 2022-02-01 DIAGNOSIS — N184 Chronic kidney disease, stage 4 (severe): Secondary | ICD-10-CM | POA: Diagnosis not present

## 2022-02-01 DIAGNOSIS — R1319 Other dysphagia: Secondary | ICD-10-CM | POA: Diagnosis not present

## 2022-02-12 DIAGNOSIS — U071 COVID-19: Secondary | ICD-10-CM | POA: Diagnosis not present

## 2022-02-18 DIAGNOSIS — Z7189 Other specified counseling: Secondary | ICD-10-CM | POA: Diagnosis not present

## 2022-02-18 DIAGNOSIS — Z79899 Other long term (current) drug therapy: Secondary | ICD-10-CM | POA: Diagnosis not present

## 2022-02-18 DIAGNOSIS — I10 Essential (primary) hypertension: Secondary | ICD-10-CM | POA: Diagnosis not present

## 2022-02-18 DIAGNOSIS — R569 Unspecified convulsions: Secondary | ICD-10-CM | POA: Diagnosis not present

## 2022-02-18 DIAGNOSIS — K8681 Exocrine pancreatic insufficiency: Secondary | ICD-10-CM | POA: Diagnosis not present

## 2022-02-26 DIAGNOSIS — Z79899 Other long term (current) drug therapy: Secondary | ICD-10-CM | POA: Diagnosis not present

## 2022-02-26 DIAGNOSIS — D65 Disseminated intravascular coagulation [defibrination syndrome]: Secondary | ICD-10-CM | POA: Diagnosis not present

## 2022-02-28 DIAGNOSIS — R410 Disorientation, unspecified: Secondary | ICD-10-CM | POA: Diagnosis not present

## 2022-02-28 DIAGNOSIS — R300 Dysuria: Secondary | ICD-10-CM | POA: Diagnosis not present

## 2022-03-01 DIAGNOSIS — R3915 Urgency of urination: Secondary | ICD-10-CM | POA: Diagnosis not present

## 2022-03-05 DIAGNOSIS — Z79899 Other long term (current) drug therapy: Secondary | ICD-10-CM | POA: Diagnosis not present

## 2022-03-05 DIAGNOSIS — N390 Urinary tract infection, site not specified: Secondary | ICD-10-CM | POA: Diagnosis not present

## 2022-03-12 DIAGNOSIS — K5900 Constipation, unspecified: Secondary | ICD-10-CM | POA: Diagnosis not present

## 2022-03-12 DIAGNOSIS — K8681 Exocrine pancreatic insufficiency: Secondary | ICD-10-CM | POA: Diagnosis not present

## 2022-03-12 DIAGNOSIS — Z79899 Other long term (current) drug therapy: Secondary | ICD-10-CM | POA: Diagnosis not present

## 2022-03-18 DIAGNOSIS — Z79899 Other long term (current) drug therapy: Secondary | ICD-10-CM | POA: Diagnosis not present

## 2022-03-24 ENCOUNTER — Other Ambulatory Visit: Payer: Self-pay

## 2022-03-24 ENCOUNTER — Emergency Department (HOSPITAL_COMMUNITY)
Admission: EM | Admit: 2022-03-24 | Discharge: 2022-03-25 | Disposition: A | Discharge status: Skilled Nursing Facility | Payer: Medicare Other | Attending: Emergency Medicine | Admitting: Emergency Medicine

## 2022-03-24 ENCOUNTER — Emergency Department (HOSPITAL_COMMUNITY): Payer: Medicare Other

## 2022-03-24 DIAGNOSIS — M25561 Pain in right knee: Secondary | ICD-10-CM | POA: Insufficient documentation

## 2022-03-24 DIAGNOSIS — R739 Hyperglycemia, unspecified: Secondary | ICD-10-CM | POA: Diagnosis not present

## 2022-03-24 DIAGNOSIS — Z7982 Long term (current) use of aspirin: Secondary | ICD-10-CM | POA: Diagnosis not present

## 2022-03-24 DIAGNOSIS — R6889 Other general symptoms and signs: Secondary | ICD-10-CM | POA: Diagnosis not present

## 2022-03-24 DIAGNOSIS — R531 Weakness: Secondary | ICD-10-CM | POA: Diagnosis not present

## 2022-03-24 DIAGNOSIS — R410 Disorientation, unspecified: Secondary | ICD-10-CM | POA: Diagnosis not present

## 2022-03-24 DIAGNOSIS — E860 Dehydration: Secondary | ICD-10-CM | POA: Insufficient documentation

## 2022-03-24 DIAGNOSIS — R4182 Altered mental status, unspecified: Secondary | ICD-10-CM | POA: Diagnosis not present

## 2022-03-24 DIAGNOSIS — R42 Dizziness and giddiness: Secondary | ICD-10-CM | POA: Diagnosis present

## 2022-03-24 DIAGNOSIS — Z794 Long term (current) use of insulin: Secondary | ICD-10-CM | POA: Insufficient documentation

## 2022-03-24 DIAGNOSIS — U071 COVID-19: Secondary | ICD-10-CM | POA: Insufficient documentation

## 2022-03-24 DIAGNOSIS — Z743 Need for continuous supervision: Secondary | ICD-10-CM | POA: Diagnosis not present

## 2022-03-24 LAB — URINALYSIS, ROUTINE W REFLEX MICROSCOPIC
Bilirubin Urine: NEGATIVE
Glucose, UA: NEGATIVE mg/dL
Hgb urine dipstick: NEGATIVE
Ketones, ur: NEGATIVE mg/dL
Leukocytes,Ua: NEGATIVE
Nitrite: NEGATIVE
Protein, ur: 100 mg/dL — AB
Specific Gravity, Urine: 1.006 (ref 1.005–1.030)
pH: 7 (ref 5.0–8.0)

## 2022-03-24 LAB — COMPREHENSIVE METABOLIC PANEL
ALT: 12 U/L (ref 0–44)
AST: 28 U/L (ref 15–41)
Albumin: 2.7 g/dL — ABNORMAL LOW (ref 3.5–5.0)
Alkaline Phosphatase: 99 U/L (ref 38–126)
Anion gap: 9 (ref 5–15)
BUN: 62 mg/dL — ABNORMAL HIGH (ref 8–23)
CO2: 24 mmol/L (ref 22–32)
Calcium: 8.9 mg/dL (ref 8.9–10.3)
Chloride: 108 mmol/L (ref 98–111)
Creatinine, Ser: 1.4 mg/dL — ABNORMAL HIGH (ref 0.44–1.00)
GFR, Estimated: 39 mL/min — ABNORMAL LOW (ref >60)
Glucose, Bld: 203 mg/dL — ABNORMAL HIGH (ref 70–99)
Potassium: 3.9 mmol/L (ref 3.5–5.1)
Sodium: 141 mmol/L (ref 135–145)
Total Bilirubin: 0.5 mg/dL (ref 0.3–1.2)
Total Protein: 6.1 g/dL — ABNORMAL LOW (ref 6.5–8.1)

## 2022-03-24 LAB — CBC WITH DIFFERENTIAL/PLATELET
Abs Immature Granulocytes: 0.01 10*3/uL (ref 0.00–0.07)
Basophils Absolute: 0.1 10*3/uL (ref 0.0–0.1)
Basophils Relative: 1 %
Eosinophils Absolute: 0.2 10*3/uL (ref 0.0–0.5)
Eosinophils Relative: 4 %
HCT: 32.6 % — ABNORMAL LOW (ref 36.0–46.0)
Hemoglobin: 11.4 g/dL — ABNORMAL LOW (ref 12.0–15.0)
Immature Granulocytes: 0 %
Lymphocytes Relative: 41 %
Lymphs Abs: 2.4 10*3/uL (ref 0.7–4.0)
MCH: 31.2 pg (ref 26.0–34.0)
MCHC: 35 g/dL (ref 30.0–36.0)
MCV: 89.3 fL (ref 80.0–100.0)
Monocytes Absolute: 0.5 10*3/uL (ref 0.1–1.0)
Monocytes Relative: 9 %
Neutro Abs: 2.7 10*3/uL (ref 1.7–7.7)
Neutrophils Relative %: 45 %
Platelets: 176 10*3/uL (ref 150–400)
RBC: 3.65 MIL/uL — ABNORMAL LOW (ref 3.87–5.11)
RDW: 13.7 % (ref 11.5–15.5)
WBC: 5.9 10*3/uL (ref 4.0–10.5)
nRBC: 0 % (ref 0.0–0.2)

## 2022-03-24 LAB — RESP PANEL BY RT-PCR (RSV, FLU A&B, COVID)  RVPGX2
Influenza A by PCR: NEGATIVE
Influenza B by PCR: NEGATIVE
Resp Syncytial Virus by PCR: NEGATIVE
SARS Coronavirus 2 by RT PCR: POSITIVE — AB

## 2022-03-24 LAB — BLOOD GAS, VENOUS
Acid-Base Excess: 3 mmol/L — ABNORMAL HIGH (ref 0.0–2.0)
Bicarbonate: 25.5 mmol/L (ref 20.0–28.0)
Drawn by: 7049
FIO2: 21 %
O2 Saturation: 59.2 %
Patient temperature: 36.4
pCO2, Ven: 31 mmHg — ABNORMAL LOW (ref 44–60)
pH, Ven: 7.52 — ABNORMAL HIGH (ref 7.25–7.43)
pO2, Ven: <31 mmHg — CL (ref 32–45)

## 2022-03-24 LAB — CBG MONITORING, ED: Glucose-Capillary: 203 mg/dL — ABNORMAL HIGH (ref 70–99)

## 2022-03-24 LAB — AMMONIA: Ammonia: 41 umol/L — ABNORMAL HIGH (ref 9–35)

## 2022-03-24 MED ORDER — ACETAMINOPHEN 325 MG PO TABS
650.0000 mg | ORAL_TABLET | Freq: Once | ORAL | Status: AC
  Administered 2022-03-24: 650 mg via ORAL
  Filled 2022-03-24: qty 2

## 2022-03-24 MED ORDER — SODIUM CHLORIDE 0.9 % IV BOLUS
1000.0000 mL | Freq: Once | INTRAVENOUS | Status: AC
  Administered 2022-03-24: 1000 mL via INTRAVENOUS

## 2022-03-24 MED ORDER — NIRMATRELVIR/RITONAVIR (PAXLOVID) TABLET (RENAL DOSING): 2.0000 | ORAL_TABLET | Freq: Two times a day (BID) | ORAL | 0 refills | Status: AC

## 2022-03-24 NOTE — ED Triage Notes (Signed)
Pt BIB [facility] for weakness and AMS from [facility], pt possibly has UTI?

## 2022-03-24 NOTE — Discharge Instructions (Addendum)
Your COVID test today is positive.  You are being prescribed Paxlovid to treat this.  Do not take your Zocor while you are taking the Paxlovid.  Your blood work also indicates that you are dehydrated today.  Make sure you are drinking plenty of fluids.  Take Tylenol for headache or fever.  If you develop trouble breathing, vomiting, blood in the stool, abdominal pain, or any other new/concerning symptoms then return to the ER for evaluation.

## 2022-03-24 NOTE — ED Provider Notes (Signed)
[facility] EMERGENCY DEPARTMENT Provider Note   CSN: [id number] Arrival date & time: 03/24/22  1757     History  Chief Complaint  Patient presents with   Altered Mental Status    Elizabeth Crosby is a 76 y.o. female.  HPI 76 year old female presents with altered mental status.  Patient is here alone and EMS is no longer present so the history is primarily from the nurse.  [place] [facility] said she was altered today but there were no focal neurodeficits.  Patient has no complaints.  When asked about headache she states that her head feels off but is not really pain.  Otherwise, history is pretty unremarkable.   Later in the visit the nurse was able to talk to [facility] and they noted that she had a stutter, seem to be more emotional.  She also developed diarrhea today, right knee pain, and noted to have left ear bleeding.  She seemed dizzy and weak today.  The nurse seem to indicate that the mental status change happened sometime after being given Imodium.  No falls.    Home Medications Prior to Admission medications   Medication Sig Start Date End Date Taking? Authorizing Provider  acetaminophen (TYLENOL) 500 MG tablet Take 500 mg by mouth every 4 (four) hours as needed (pain).   Yes [provider]  ALPRAZolam (XANAX) 0.25 MG tablet Take 0.25 mg by mouth daily as needed for anxiety.   Yes [provider]  Amino Acids-Protein Hydrolys (PRO-STAT) LIQD Take 30 mLs by mouth in the morning. (0900)   Yes [provider]  ascorbic acid (VITAMIN C) 500 MG tablet Take 500 mg by mouth in the morning. (0900)   Yes [provider]  aspirin EC 81 MG tablet Take 81 mg by mouth in the morning. (0900)   Yes [provider]  Cholecalciferol (VITAMIN D3) 50 MCG (2000 UT) TABS Take 2,000 Units by mouth in the morning. (0900)   Yes [provider]  famotidine (PEPCID) 20 MG tablet Take 20 mg by mouth 2 (two) times  daily. (0600, 1800)   Yes [provider]  ferrous sulfate 325 (65 FE) MG tablet Take 325 mg by mouth in the morning. (0900)   Yes [provider]  insulin degludec (TRESIBA FLEXTOUCH) 100 UNIT/ML FlexTouch Pen Inject 10 Units into the skin in the morning. (0600)   Yes [provider]  insulin lispro (HUMALOG) 100 UNIT/ML injection Inject 0-12 Units into the skin See admin instructions. Inject 0-12 units before meals (0830, 1230, 1730) per sliding scale: BS 000-150 : 0 units BS 151-200 : 2 units BS 201-250 : 4 units BS 251-300 : 6 units BS 301-350 : 8 units BS 351-400 : 10 units BS 401+ : 12 units (call MD)   Yes [provider]  ipratropium (ATROVENT) 0.06 % nasal spray Place 2 sprays into both nostrils every 6 (six) hours as needed (sinus congestion).   Yes [provider]  levETIRAcetam (KEPPRA) 500 MG tablet Take 1 tablet (500 mg total) by mouth 2 (two) times daily. Patient taking differently: Take 500 mg by mouth 2 (two) times daily. (0900, 2100) 12/28/21 05/11/22 Yes Pahwani, Einar Grad, MD  lipase/protease/amylase (CREON) 36000 UNITS CPEP capsule Take 72,000 Units by mouth with breakfast, with lunch, and with evening meal. (0900, 1300, 1800)   Yes [provider]  Menthol-Zinc Oxide (CALMOSEPTINE) 0.44-20.6 % OINT Apply 1 application  topically every 8 (eight) hours as needed (protection,  provention). To buttocks   Yes [provider]  pantoprazole (PROTONIX) 40 MG tablet Take 1 tablet (40 mg total) by mouth daily. Patient taking differently: Take 40 mg by mouth in the morning. (0600) 12/28/21 05/11/22 Yes Pahwani, Einar Grad, MD  sennosides-docusate sodium (SENOKOT-S) 8.6-50 MG tablet Take 2 tablets by mouth at bedtime.   Yes [provider]  simvastatin (ZOCOR) 20 MG tablet Take 20 mg by mouth daily at 6 PM.   Yes [provider]  vitamin B-12 (CYANOCOBALAMIN) 500 MCG tablet Take 500 mcg by mouth in the morning. (0900)    Yes [provider]      Allergies    Ozempic (0.25 or 0.5 mg-dose) [semaglutide(0.25 or 0.'5mg'$ -dos)], Amoxil [amoxicillin], Glucophage [metformin], Invokana [canagliflozin], Lipitor [atorvastatin], Lyrica [pregabalin], Other, Sulfa antibiotics, Trulicity [dulaglutide], and Klor-con [potassium chloride]    Review of Systems   Review of Systems  Unable to perform ROS: Mental status change    Physical Exam Updated Vital Signs BP (!) 130/49 (BP Location: Left Arm)   Pulse 61   Temp 97.6 F (36.4 C) (Oral)   Resp 14   Ht '5\' 1"'$  (1.549 m)   Wt 74.4 kg   SpO2 100%   BMI 30.99 kg/m  Physical Exam Vitals and nursing note reviewed.  Constitutional:      Appearance: She is well-developed.  HENT:     Head: Normocephalic and atraumatic.     Right Ear: Tympanic membrane normal.     Left Ear: Tympanic membrane normal.     Ears:     Comments: Dried blood in left ear canal Cardiovascular:     Rate and Rhythm: Normal rate and regular rhythm.     Heart sounds: Normal heart sounds.  Pulmonary:     Effort: Pulmonary effort is normal.     Breath sounds: Normal breath sounds.  Abdominal:     Palpations: Abdomen is soft.     Tenderness: There is no abdominal tenderness.  Musculoskeletal:     Comments: Right knee with darkened discoloration (bruising) to the medial aspect. Some tenderness/pain with ROM. No appreciable joint effusion. No increased warmth  Skin:    General: Skin is warm and dry.  Neurological:     Mental Status: She is alert.     ED Results / Procedures / Treatments   Labs (all labs ordered are listed, but only abnormal results are displayed) Labs Reviewed  RESP PANEL BY RT-PCR (RSV, FLU A&B, COVID)  RVPGX2 - Abnormal; Notable for the following components:      Result Value   SARS Coronavirus 2 by RT PCR POSITIVE (*)    All other components within normal limits  COMPREHENSIVE METABOLIC PANEL - Abnormal; Notable for the following components:   Glucose, Bld 203  (*)    BUN 62 (*)    Creatinine, Ser 1.40 (*)    Total Protein 6.1 (*)    Albumin 2.7 (*)    GFR, Estimated 39 (*)    All other components within normal limits  CBC WITH DIFFERENTIAL/PLATELET - Abnormal; Notable for the following components:   RBC 3.65 (*)    Hemoglobin 11.4 (*)    HCT 32.6 (*)    All other components within normal limits  BLOOD GAS, VENOUS - Abnormal; Notable for the following components:   pH, Ven 7.52 (*)    pCO2, Ven 31 (*)    pO2, Ven <31 (*)    Acid-Base Excess 3.0 (*)    All other components within  normal limits  AMMONIA - Abnormal; Notable for the following components:   Ammonia 41 (*)    All other components within normal limits  CBG MONITORING, ED - Abnormal; Notable for the following components:   Glucose-Capillary 203 (*)    All other components within normal limits  URINALYSIS, ROUTINE W REFLEX MICROSCOPIC  CBG MONITORING, ED    EKG EKG Interpretation  Date/Time:  Sunday March 24 2022 19:04:25 EST Ventricular Rate:  62 PR Interval:    QRS Duration: 145 QT Interval:  466 QTC Calculation: 474 R Axis:   -42 Text Interpretation: sinus rhythm with artifact RBBB and LAFB Probable left ventricular hypertrophy Artifact in lead(s) I II aVR aVL Interpretation limited secondary to artifact Confirmed by Sherwood Gambler [phone]) on 03/24/2022 8:37:22 PM  Radiology DG Chest 2 View  Result Date: 03/24/2022 CLINICAL DATA:  Altered mental status EXAM: CHEST - 2 VIEW COMPARISON:  Chest x-ray dated December 22, 2021 FINDINGS: The heart size and mediastinal contours are within normal limits. Both lungs are clear. The visualized skeletal structures are unremarkable. IMPRESSION: No active cardiopulmonary disease. Electronically Signed   By: Yetta Glassman M.D.   On: 03/24/2022 19:38   CT Head Wo Contrast  Result Date: 03/24/2022 CLINICAL DATA:  Delirium EXAM: CT HEAD WITHOUT CONTRAST TECHNIQUE: Contiguous axial images were obtained from the base of the  skull through the vertex without intravenous contrast. RADIATION DOSE REDUCTION: This exam was performed according to the departmental dose-optimization program which includes automated exposure control, adjustment of the mA and/or kV according to patient size and/or use of iterative reconstruction technique. COMPARISON:  12/14/2021 FINDINGS: Brain: No evidence of acute infarction, hemorrhage, hydrocephalus, extra-axial collection or mass lesion/mass effect. Vascular: No hyperdense vessel or unexpected calcification. Skull: Normal. Negative for fracture or focal lesion. Sinuses/Orbits: Mucosal thickening of the right frontal sinus and involving anterior right ethmoid air cells. Partially imaged left maxillary retention cyst. Other: None. IMPRESSION: 1. No acute intracranial abnormality. 2. Right frontal and ethmoid sinus disease. Electronically Signed   By: Davina Poke D.O.   On: 03/24/2022 19:32    Procedures Procedures    Medications Ordered in ED Medications  sodium chloride 0.9 % bolus 1,000 mL (has no administration in time range)  acetaminophen (TYLENOL) tablet 650 mg (has no administration in time range)    ED Course/ Medical Decision Making/ A&P Clinical Course as of 03/24/22 2033  Nancy Fetter Mar 24, 2022  2031 Nurse spoke to [facility].  Seems like patient has had multiple issues today.  They noticed some bleeding coming out of her left ear.  Has had diarrhea today and then seem to be a little more confused and stuttering after being giving Imodium.  Is also complaining of a little bit of right knee pain.  Seems dizzy and weak today.  No falls.  Exam does show some blood in her left ear canal but the TM is okay.  No active bleeding.  She has some minimal/benign lower abdominal discomfort but not really tenderness on exam.  Her COVID test is positive so I think that overall this is coming from.  Will give some fluids and x-ray her right knee. [SG]    Clinical Course User Index [SG]  Sherwood Gambler, MD                           Medical Decision Making Amount and/or Complexity of Data Reviewed Labs: ordered.    Details: Covid test is  positive. Mild kidney injury compared to baseline (but less than 0.5). Normal WBC Radiology: ordered and independent interpretation performed.    Details: No head bleed, pneumonia, knee fracture. ECG/medicine tests: independent interpretation performed.    Details: No acute ischemia  Risk OTC drugs.   Patient presents with COVID-19.  I think this would explain all of her symptoms including her headache, diarrhea, etc.  I discussed over the phone with her daughter and she states anytime she gets an infection she will get confused from her baseline.  However her baseline is not quite the same since her hospitalization in September.  Right now she is awake, alert, and seems to be at what the baseline is compared to what the daughter describes.  I doubt acute CNS infection.  Given her comorbidities I think it is reasonable to treat with Paxlovid.  Her urinalysis has bacteria but no signs of UTI and she has had asymptomatic bacteriuria in the past.  At this point, I think she is stable for discharge back to her facility.        Final Clinical Impression(s) / ED Diagnoses Final diagnoses:  COVID-19  Dehydration    Rx / DC Orders ED Discharge Orders     None         Sherwood Gambler, MD 03/24/22 2259

## 2022-03-24 NOTE — ED Notes (Signed)
This nurse called [facility] to verify pts symptoms. Pt was noted to have blood in left ear, nurse was concerned that it was coming from her inner ear. Last known well was at 1430 today per nurse at facility. Pt c/o right knee pain, dizziness, new confusion, head ache, and new stutter. Clarification has been reported to MD

## 2022-03-26 DIAGNOSIS — Z7189 Other specified counseling: Secondary | ICD-10-CM | POA: Diagnosis not present

## 2022-03-26 DIAGNOSIS — Z79899 Other long term (current) drug therapy: Secondary | ICD-10-CM | POA: Diagnosis not present

## 2022-03-26 DIAGNOSIS — R1310 Dysphagia, unspecified: Secondary | ICD-10-CM | POA: Diagnosis not present

## 2022-03-26 DIAGNOSIS — E860 Dehydration: Secondary | ICD-10-CM | POA: Diagnosis not present

## 2022-03-26 DIAGNOSIS — U071 COVID-19: Secondary | ICD-10-CM | POA: Diagnosis not present

## 2022-03-26 DIAGNOSIS — R1319 Other dysphagia: Secondary | ICD-10-CM | POA: Diagnosis not present

## 2022-03-27 DIAGNOSIS — E785 Hyperlipidemia, unspecified: Secondary | ICD-10-CM | POA: Diagnosis not present

## 2022-03-27 DIAGNOSIS — E119 Type 2 diabetes mellitus without complications: Secondary | ICD-10-CM | POA: Diagnosis not present

## 2022-03-27 DIAGNOSIS — K219 Gastro-esophageal reflux disease without esophagitis: Secondary | ICD-10-CM | POA: Diagnosis not present

## 2022-03-27 DIAGNOSIS — E8809 Other disorders of plasma-protein metabolism, not elsewhere classified: Secondary | ICD-10-CM | POA: Diagnosis not present

## 2022-03-28 DIAGNOSIS — Z794 Long term (current) use of insulin: Secondary | ICD-10-CM | POA: Diagnosis not present

## 2022-03-28 DIAGNOSIS — Z79899 Other long term (current) drug therapy: Secondary | ICD-10-CM | POA: Diagnosis not present

## 2022-03-28 DIAGNOSIS — E119 Type 2 diabetes mellitus without complications: Secondary | ICD-10-CM | POA: Diagnosis not present

## 2022-04-02 DIAGNOSIS — Z7189 Other specified counseling: Secondary | ICD-10-CM | POA: Diagnosis not present

## 2022-04-02 DIAGNOSIS — Z79899 Other long term (current) drug therapy: Secondary | ICD-10-CM | POA: Diagnosis not present

## 2022-04-02 DIAGNOSIS — U071 COVID-19: Secondary | ICD-10-CM | POA: Diagnosis not present

## 2022-04-18 DIAGNOSIS — R569 Unspecified convulsions: Secondary | ICD-10-CM | POA: Diagnosis not present

## 2022-04-18 DIAGNOSIS — E119 Type 2 diabetes mellitus without complications: Secondary | ICD-10-CM | POA: Diagnosis not present

## 2022-04-18 DIAGNOSIS — Z794 Long term (current) use of insulin: Secondary | ICD-10-CM | POA: Diagnosis not present

## 2022-04-18 DIAGNOSIS — I10 Essential (primary) hypertension: Secondary | ICD-10-CM | POA: Diagnosis not present

## 2022-04-18 DIAGNOSIS — K219 Gastro-esophageal reflux disease without esophagitis: Secondary | ICD-10-CM | POA: Diagnosis not present

## 2022-04-18 DIAGNOSIS — Z79899 Other long term (current) drug therapy: Secondary | ICD-10-CM | POA: Diagnosis not present

## 2022-04-18 DIAGNOSIS — K8681 Exocrine pancreatic insufficiency: Secondary | ICD-10-CM | POA: Diagnosis not present

## 2022-04-22 ENCOUNTER — Ambulatory Visit (INDEPENDENT_AMBULATORY_CARE_PROVIDER_SITE_OTHER): Payer: Medicare Other | Admitting: Advanced Practice Midwife

## 2022-04-22 ENCOUNTER — Encounter: Payer: Self-pay | Admitting: Advanced Practice Midwife

## 2022-04-22 VITALS — BP 133/82 | HR 86

## 2022-04-22 DIAGNOSIS — Z0000 Encounter for general adult medical examination without abnormal findings: Secondary | ICD-10-CM

## 2022-04-22 DIAGNOSIS — Z8542 Personal history of malignant neoplasm of other parts of uterus: Secondary | ICD-10-CM | POA: Diagnosis not present

## 2022-04-22 NOTE — Progress Notes (Signed)
NGYN pt present with support staff to follow up Stage IB grade 1 Endometrioid Endometrial Adenocarcinoma?  Oncology notes recommends pt to follow up with Dr. [name].  Pt showing signs of confusion.

## 2022-04-22 NOTE — Progress Notes (Signed)
   GYNECOLOGY PROGRESS NOTE  History:  77 y.o.  presents to [place] office today for new gyn visit. She was treated for endometrial cancer with hysterectomy/oophorectomy in 2019. She sees Dr Gery Pray with radiation oncology, last visit 01/28/22.  Per Dr Sondra Come, pt to follow up with Dr [name] in 6 months. Pt has PCP [name] at [facility].  The patient is unsure if she has had mammograms or colonoscopy screenings in the past.    Health Maintenance Due  Topic Date Due   FOOT EXAM  Never done   OPHTHALMOLOGY EXAM  Never done   Diabetic kidney evaluation - Urine ACR  Never done   Hepatitis C Screening  Never done   Zoster Vaccines- Shingrix (1 of 2) Never done   DEXA SCAN  Never done   Pneumonia Vaccine 2+ Years old (4 - PPSV23 or PCV20) 06/24/2014   INFLUENZA VACCINE  10/30/2021   COVID-19 Vaccine (4 - 2023-24 season) 11/30/2021   HEMOGLOBIN A1C  04/05/2022     Review of Systems:  Pertinent items are noted in HPI.   Objective:  Physical Exam Blood pressure 133/82, pulse 86. VS reviewed, nursing note reviewed,  Constitutional: well developed, well nourished, no distress HEENT: normocephalic CV: normal rate Pulm/chest wall: normal effort Breast Exam: deferred Abdomen: soft Neuro: alert and oriented x 3 Skin: warm, dry Psych: affect normal Pelvic exam: Deferred  Assessment & Plan:  1. Well woman exam (no gynecological exam) --Pap is not needed, basic physical exam and history are within normal for patient's limited mobility and memory/communication difficulties. -- Pt unsure if she has ever had mammogram or colonoscopy --I will follow up with PCP, Dr [name] at [place] regarding screening tests  --Pt needs follow up with oncology, has seen Dr Delsa Sale in the past.  Our office to call [facility] and help arrange appropriate follow up as recommended by Dr Sondra Come.  No follow-ups on file.   Fatima Blank,  CNM 12:26 PM

## 2022-05-01 DIAGNOSIS — R569 Unspecified convulsions: Secondary | ICD-10-CM | POA: Diagnosis not present

## 2022-05-02 DIAGNOSIS — R569 Unspecified convulsions: Secondary | ICD-10-CM | POA: Diagnosis not present

## 2022-05-03 DIAGNOSIS — R569 Unspecified convulsions: Secondary | ICD-10-CM | POA: Diagnosis not present

## 2022-05-05 DIAGNOSIS — R569 Unspecified convulsions: Secondary | ICD-10-CM | POA: Diagnosis not present

## 2022-05-06 DIAGNOSIS — R569 Unspecified convulsions: Secondary | ICD-10-CM | POA: Diagnosis not present

## 2022-05-07 DIAGNOSIS — R569 Unspecified convulsions: Secondary | ICD-10-CM | POA: Diagnosis not present

## 2022-05-08 DIAGNOSIS — R569 Unspecified convulsions: Secondary | ICD-10-CM | POA: Diagnosis not present

## 2022-05-09 DIAGNOSIS — R569 Unspecified convulsions: Secondary | ICD-10-CM | POA: Diagnosis not present

## 2022-05-13 DIAGNOSIS — R569 Unspecified convulsions: Secondary | ICD-10-CM | POA: Diagnosis not present

## 2022-05-14 DIAGNOSIS — R569 Unspecified convulsions: Secondary | ICD-10-CM | POA: Diagnosis not present

## 2022-05-15 DIAGNOSIS — R569 Unspecified convulsions: Secondary | ICD-10-CM | POA: Diagnosis not present

## 2022-05-17 DIAGNOSIS — R569 Unspecified convulsions: Secondary | ICD-10-CM | POA: Diagnosis not present

## 2022-05-18 DIAGNOSIS — R569 Unspecified convulsions: Secondary | ICD-10-CM | POA: Diagnosis not present

## 2022-05-19 DIAGNOSIS — R569 Unspecified convulsions: Secondary | ICD-10-CM | POA: Diagnosis not present

## 2022-05-20 DIAGNOSIS — R569 Unspecified convulsions: Secondary | ICD-10-CM | POA: Diagnosis not present

## 2022-05-21 DIAGNOSIS — R569 Unspecified convulsions: Secondary | ICD-10-CM | POA: Diagnosis not present

## 2022-05-22 DIAGNOSIS — R569 Unspecified convulsions: Secondary | ICD-10-CM | POA: Diagnosis not present

## 2022-05-23 DIAGNOSIS — R569 Unspecified convulsions: Secondary | ICD-10-CM | POA: Diagnosis not present

## 2022-05-24 DIAGNOSIS — R569 Unspecified convulsions: Secondary | ICD-10-CM | POA: Diagnosis not present

## 2022-05-27 DIAGNOSIS — R569 Unspecified convulsions: Secondary | ICD-10-CM | POA: Diagnosis not present

## 2022-06-13 DIAGNOSIS — Z79899 Other long term (current) drug therapy: Secondary | ICD-10-CM | POA: Diagnosis not present

## 2022-06-13 DIAGNOSIS — R569 Unspecified convulsions: Secondary | ICD-10-CM | POA: Diagnosis not present

## 2022-06-13 DIAGNOSIS — K219 Gastro-esophageal reflux disease without esophagitis: Secondary | ICD-10-CM | POA: Diagnosis not present

## 2022-06-13 DIAGNOSIS — K8681 Exocrine pancreatic insufficiency: Secondary | ICD-10-CM | POA: Diagnosis not present

## 2022-06-13 DIAGNOSIS — N189 Chronic kidney disease, unspecified: Secondary | ICD-10-CM | POA: Diagnosis not present

## 2022-06-19 DIAGNOSIS — N184 Chronic kidney disease, stage 4 (severe): Secondary | ICD-10-CM | POA: Diagnosis not present

## 2022-06-19 DIAGNOSIS — I10 Essential (primary) hypertension: Secondary | ICD-10-CM | POA: Diagnosis not present

## 2022-06-19 DIAGNOSIS — E785 Hyperlipidemia, unspecified: Secondary | ICD-10-CM | POA: Diagnosis not present

## 2022-06-19 DIAGNOSIS — D509 Iron deficiency anemia, unspecified: Secondary | ICD-10-CM | POA: Diagnosis not present

## 2022-06-19 DIAGNOSIS — E8809 Other disorders of plasma-protein metabolism, not elsewhere classified: Secondary | ICD-10-CM | POA: Diagnosis not present

## 2022-06-19 DIAGNOSIS — E559 Vitamin D deficiency, unspecified: Secondary | ICD-10-CM | POA: Diagnosis not present

## 2022-06-19 DIAGNOSIS — E119 Type 2 diabetes mellitus without complications: Secondary | ICD-10-CM | POA: Diagnosis not present

## 2022-06-19 DIAGNOSIS — R569 Unspecified convulsions: Secondary | ICD-10-CM | POA: Diagnosis not present

## 2022-06-19 DIAGNOSIS — K219 Gastro-esophageal reflux disease without esophagitis: Secondary | ICD-10-CM | POA: Diagnosis not present

## 2022-06-19 DIAGNOSIS — M170 Bilateral primary osteoarthritis of knee: Secondary | ICD-10-CM | POA: Diagnosis not present

## 2022-06-19 DIAGNOSIS — K8681 Exocrine pancreatic insufficiency: Secondary | ICD-10-CM | POA: Diagnosis not present

## 2022-06-21 DIAGNOSIS — Z9189 Other specified personal risk factors, not elsewhere classified: Secondary | ICD-10-CM | POA: Diagnosis not present

## 2022-06-21 DIAGNOSIS — E46 Unspecified protein-calorie malnutrition: Secondary | ICD-10-CM | POA: Diagnosis not present

## 2022-06-24 DIAGNOSIS — Z79899 Other long term (current) drug therapy: Secondary | ICD-10-CM | POA: Diagnosis not present

## 2022-06-24 DIAGNOSIS — Z9189 Other specified personal risk factors, not elsewhere classified: Secondary | ICD-10-CM | POA: Diagnosis not present

## 2022-06-24 DIAGNOSIS — E46 Unspecified protein-calorie malnutrition: Secondary | ICD-10-CM | POA: Diagnosis not present

## 2022-06-25 DIAGNOSIS — E46 Unspecified protein-calorie malnutrition: Secondary | ICD-10-CM | POA: Diagnosis not present

## 2022-06-25 DIAGNOSIS — Z9189 Other specified personal risk factors, not elsewhere classified: Secondary | ICD-10-CM | POA: Diagnosis not present

## 2022-07-09 DIAGNOSIS — R0989 Other specified symptoms and signs involving the circulatory and respiratory systems: Secondary | ICD-10-CM | POA: Diagnosis not present

## 2022-07-09 DIAGNOSIS — Z79899 Other long term (current) drug therapy: Secondary | ICD-10-CM | POA: Diagnosis not present

## 2022-07-09 DIAGNOSIS — R222 Localized swelling, mass and lump, trunk: Secondary | ICD-10-CM | POA: Diagnosis not present

## 2022-07-16 DIAGNOSIS — E119 Type 2 diabetes mellitus without complications: Secondary | ICD-10-CM | POA: Diagnosis not present

## 2022-08-01 DIAGNOSIS — D649 Anemia, unspecified: Secondary | ICD-10-CM | POA: Diagnosis not present

## 2022-08-01 DIAGNOSIS — Z79899 Other long term (current) drug therapy: Secondary | ICD-10-CM | POA: Diagnosis not present

## 2022-08-15 DIAGNOSIS — E119 Type 2 diabetes mellitus without complications: Secondary | ICD-10-CM | POA: Diagnosis not present

## 2022-08-15 DIAGNOSIS — Z794 Long term (current) use of insulin: Secondary | ICD-10-CM | POA: Diagnosis not present

## 2022-08-15 DIAGNOSIS — E785 Hyperlipidemia, unspecified: Secondary | ICD-10-CM | POA: Diagnosis not present

## 2022-08-15 DIAGNOSIS — Z79899 Other long term (current) drug therapy: Secondary | ICD-10-CM | POA: Diagnosis not present

## 2022-08-15 DIAGNOSIS — N1831 Chronic kidney disease, stage 3a: Secondary | ICD-10-CM | POA: Diagnosis not present

## 2022-08-27 DIAGNOSIS — F32A Depression, unspecified: Secondary | ICD-10-CM | POA: Diagnosis not present

## 2022-08-27 DIAGNOSIS — Z79899 Other long term (current) drug therapy: Secondary | ICD-10-CM | POA: Diagnosis not present

## 2022-09-25 DIAGNOSIS — K219 Gastro-esophageal reflux disease without esophagitis: Secondary | ICD-10-CM | POA: Diagnosis not present

## 2022-09-25 DIAGNOSIS — E119 Type 2 diabetes mellitus without complications: Secondary | ICD-10-CM | POA: Diagnosis not present

## 2022-09-25 DIAGNOSIS — E785 Hyperlipidemia, unspecified: Secondary | ICD-10-CM | POA: Diagnosis not present

## 2022-09-25 DIAGNOSIS — E559 Vitamin D deficiency, unspecified: Secondary | ICD-10-CM | POA: Diagnosis not present

## 2022-09-25 DIAGNOSIS — D509 Iron deficiency anemia, unspecified: Secondary | ICD-10-CM | POA: Diagnosis not present

## 2022-09-25 DIAGNOSIS — I10 Essential (primary) hypertension: Secondary | ICD-10-CM | POA: Diagnosis not present

## 2022-09-25 DIAGNOSIS — M170 Bilateral primary osteoarthritis of knee: Secondary | ICD-10-CM | POA: Diagnosis not present

## 2022-09-25 DIAGNOSIS — R569 Unspecified convulsions: Secondary | ICD-10-CM | POA: Diagnosis not present

## 2022-09-25 DIAGNOSIS — K8681 Exocrine pancreatic insufficiency: Secondary | ICD-10-CM | POA: Diagnosis not present

## 2022-09-25 DIAGNOSIS — N1831 Chronic kidney disease, stage 3a: Secondary | ICD-10-CM | POA: Diagnosis not present

## 2022-09-25 DIAGNOSIS — F32A Depression, unspecified: Secondary | ICD-10-CM | POA: Diagnosis not present

## 2022-09-26 DIAGNOSIS — E119 Type 2 diabetes mellitus without complications: Secondary | ICD-10-CM | POA: Diagnosis not present

## 2022-09-26 DIAGNOSIS — F32A Depression, unspecified: Secondary | ICD-10-CM | POA: Diagnosis not present

## 2022-09-26 DIAGNOSIS — Z79899 Other long term (current) drug therapy: Secondary | ICD-10-CM | POA: Diagnosis not present

## 2022-09-26 DIAGNOSIS — K8681 Exocrine pancreatic insufficiency: Secondary | ICD-10-CM | POA: Diagnosis not present

## 2022-09-26 DIAGNOSIS — E785 Hyperlipidemia, unspecified: Secondary | ICD-10-CM | POA: Diagnosis not present

## 2022-10-07 DIAGNOSIS — R1319 Other dysphagia: Secondary | ICD-10-CM | POA: Diagnosis not present

## 2022-10-07 DIAGNOSIS — R569 Unspecified convulsions: Secondary | ICD-10-CM | POA: Diagnosis not present

## 2022-10-08 DIAGNOSIS — R569 Unspecified convulsions: Secondary | ICD-10-CM | POA: Diagnosis not present

## 2022-10-08 DIAGNOSIS — R1319 Other dysphagia: Secondary | ICD-10-CM | POA: Diagnosis not present

## 2022-10-09 DIAGNOSIS — R1319 Other dysphagia: Secondary | ICD-10-CM | POA: Diagnosis not present

## 2022-10-09 DIAGNOSIS — R569 Unspecified convulsions: Secondary | ICD-10-CM | POA: Diagnosis not present

## 2022-10-10 DIAGNOSIS — R1319 Other dysphagia: Secondary | ICD-10-CM | POA: Diagnosis not present

## 2022-10-10 DIAGNOSIS — R569 Unspecified convulsions: Secondary | ICD-10-CM | POA: Diagnosis not present

## 2022-10-11 DIAGNOSIS — R1319 Other dysphagia: Secondary | ICD-10-CM | POA: Diagnosis not present

## 2022-10-11 DIAGNOSIS — R569 Unspecified convulsions: Secondary | ICD-10-CM | POA: Diagnosis not present

## 2022-10-14 DIAGNOSIS — R1319 Other dysphagia: Secondary | ICD-10-CM | POA: Diagnosis not present

## 2022-10-14 DIAGNOSIS — R569 Unspecified convulsions: Secondary | ICD-10-CM | POA: Diagnosis not present

## 2022-10-15 DIAGNOSIS — R1319 Other dysphagia: Secondary | ICD-10-CM | POA: Diagnosis not present

## 2022-10-15 DIAGNOSIS — R569 Unspecified convulsions: Secondary | ICD-10-CM | POA: Diagnosis not present

## 2022-10-16 DIAGNOSIS — R1319 Other dysphagia: Secondary | ICD-10-CM | POA: Diagnosis not present

## 2022-10-16 DIAGNOSIS — R569 Unspecified convulsions: Secondary | ICD-10-CM | POA: Diagnosis not present

## 2022-10-17 DIAGNOSIS — R1319 Other dysphagia: Secondary | ICD-10-CM | POA: Diagnosis not present

## 2022-10-17 DIAGNOSIS — R569 Unspecified convulsions: Secondary | ICD-10-CM | POA: Diagnosis not present

## 2022-10-18 DIAGNOSIS — R1319 Other dysphagia: Secondary | ICD-10-CM | POA: Diagnosis not present

## 2022-10-18 DIAGNOSIS — R569 Unspecified convulsions: Secondary | ICD-10-CM | POA: Diagnosis not present

## 2022-10-21 DIAGNOSIS — R569 Unspecified convulsions: Secondary | ICD-10-CM | POA: Diagnosis not present

## 2022-10-21 DIAGNOSIS — R1319 Other dysphagia: Secondary | ICD-10-CM | POA: Diagnosis not present

## 2022-10-22 DIAGNOSIS — R569 Unspecified convulsions: Secondary | ICD-10-CM | POA: Diagnosis not present

## 2022-10-22 DIAGNOSIS — R1319 Other dysphagia: Secondary | ICD-10-CM | POA: Diagnosis not present

## 2022-10-24 DIAGNOSIS — Z79899 Other long term (current) drug therapy: Secondary | ICD-10-CM | POA: Diagnosis not present

## 2022-10-24 DIAGNOSIS — K219 Gastro-esophageal reflux disease without esophagitis: Secondary | ICD-10-CM | POA: Diagnosis not present

## 2022-10-24 DIAGNOSIS — R569 Unspecified convulsions: Secondary | ICD-10-CM | POA: Diagnosis not present

## 2022-10-24 DIAGNOSIS — I10 Essential (primary) hypertension: Secondary | ICD-10-CM | POA: Diagnosis not present

## 2022-10-24 DIAGNOSIS — E785 Hyperlipidemia, unspecified: Secondary | ICD-10-CM | POA: Diagnosis not present

## 2022-10-24 DIAGNOSIS — E119 Type 2 diabetes mellitus without complications: Secondary | ICD-10-CM | POA: Diagnosis not present

## 2022-10-24 DIAGNOSIS — R1319 Other dysphagia: Secondary | ICD-10-CM | POA: Diagnosis not present

## 2022-10-25 DIAGNOSIS — R569 Unspecified convulsions: Secondary | ICD-10-CM | POA: Diagnosis not present

## 2022-10-25 DIAGNOSIS — R1319 Other dysphagia: Secondary | ICD-10-CM | POA: Diagnosis not present

## 2022-10-26 DIAGNOSIS — R1319 Other dysphagia: Secondary | ICD-10-CM | POA: Diagnosis not present

## 2022-10-26 DIAGNOSIS — R569 Unspecified convulsions: Secondary | ICD-10-CM | POA: Diagnosis not present

## 2022-10-28 DIAGNOSIS — R569 Unspecified convulsions: Secondary | ICD-10-CM | POA: Diagnosis not present

## 2022-10-28 DIAGNOSIS — R1319 Other dysphagia: Secondary | ICD-10-CM | POA: Diagnosis not present

## 2022-10-29 DIAGNOSIS — R569 Unspecified convulsions: Secondary | ICD-10-CM | POA: Diagnosis not present

## 2022-10-29 DIAGNOSIS — R1319 Other dysphagia: Secondary | ICD-10-CM | POA: Diagnosis not present

## 2022-10-30 DIAGNOSIS — R569 Unspecified convulsions: Secondary | ICD-10-CM | POA: Diagnosis not present

## 2022-10-30 DIAGNOSIS — R1319 Other dysphagia: Secondary | ICD-10-CM | POA: Diagnosis not present

## 2022-10-31 DIAGNOSIS — R569 Unspecified convulsions: Secondary | ICD-10-CM | POA: Diagnosis not present

## 2022-10-31 DIAGNOSIS — R1319 Other dysphagia: Secondary | ICD-10-CM | POA: Diagnosis not present

## 2022-11-14 DIAGNOSIS — Z79899 Other long term (current) drug therapy: Secondary | ICD-10-CM | POA: Diagnosis not present

## 2022-11-14 DIAGNOSIS — H1033 Unspecified acute conjunctivitis, bilateral: Secondary | ICD-10-CM | POA: Diagnosis not present

## 2022-11-21 DIAGNOSIS — I509 Heart failure, unspecified: Secondary | ICD-10-CM | POA: Diagnosis not present

## 2022-11-21 DIAGNOSIS — F32A Depression, unspecified: Secondary | ICD-10-CM | POA: Diagnosis not present

## 2022-11-21 DIAGNOSIS — Z79899 Other long term (current) drug therapy: Secondary | ICD-10-CM | POA: Diagnosis not present

## 2022-11-21 DIAGNOSIS — K219 Gastro-esophageal reflux disease without esophagitis: Secondary | ICD-10-CM | POA: Diagnosis not present

## 2022-11-21 DIAGNOSIS — K8681 Exocrine pancreatic insufficiency: Secondary | ICD-10-CM | POA: Diagnosis not present

## 2022-11-24 DIAGNOSIS — M25512 Pain in left shoulder: Secondary | ICD-10-CM | POA: Diagnosis not present

## 2022-11-25 DIAGNOSIS — Z79899 Other long term (current) drug therapy: Secondary | ICD-10-CM | POA: Diagnosis not present

## 2022-11-25 DIAGNOSIS — M89512 Osteolysis, left shoulder: Secondary | ICD-10-CM | POA: Diagnosis not present

## 2022-11-25 DIAGNOSIS — R52 Pain, unspecified: Secondary | ICD-10-CM | POA: Diagnosis not present

## 2022-12-03 DIAGNOSIS — R5381 Other malaise: Secondary | ICD-10-CM | POA: Diagnosis not present

## 2022-12-03 DIAGNOSIS — E441 Mild protein-calorie malnutrition: Secondary | ICD-10-CM | POA: Diagnosis not present

## 2022-12-03 DIAGNOSIS — R5383 Other fatigue: Secondary | ICD-10-CM | POA: Diagnosis not present

## 2022-12-03 DIAGNOSIS — Z9189 Other specified personal risk factors, not elsewhere classified: Secondary | ICD-10-CM | POA: Diagnosis not present

## 2022-12-03 DIAGNOSIS — Z79899 Other long term (current) drug therapy: Secondary | ICD-10-CM | POA: Diagnosis not present

## 2022-12-11 DIAGNOSIS — E441 Mild protein-calorie malnutrition: Secondary | ICD-10-CM | POA: Diagnosis not present

## 2022-12-11 DIAGNOSIS — I10 Essential (primary) hypertension: Secondary | ICD-10-CM | POA: Diagnosis not present

## 2022-12-11 DIAGNOSIS — E559 Vitamin D deficiency, unspecified: Secondary | ICD-10-CM | POA: Diagnosis not present

## 2022-12-11 DIAGNOSIS — M170 Bilateral primary osteoarthritis of knee: Secondary | ICD-10-CM | POA: Diagnosis not present

## 2022-12-11 DIAGNOSIS — D509 Iron deficiency anemia, unspecified: Secondary | ICD-10-CM | POA: Diagnosis not present

## 2022-12-11 DIAGNOSIS — N1831 Chronic kidney disease, stage 3a: Secondary | ICD-10-CM | POA: Diagnosis not present

## 2022-12-11 DIAGNOSIS — R569 Unspecified convulsions: Secondary | ICD-10-CM | POA: Diagnosis not present

## 2022-12-11 DIAGNOSIS — E785 Hyperlipidemia, unspecified: Secondary | ICD-10-CM | POA: Diagnosis not present

## 2022-12-11 DIAGNOSIS — I509 Heart failure, unspecified: Secondary | ICD-10-CM | POA: Diagnosis not present

## 2022-12-11 DIAGNOSIS — Z9189 Other specified personal risk factors, not elsewhere classified: Secondary | ICD-10-CM | POA: Diagnosis not present

## 2022-12-11 DIAGNOSIS — E119 Type 2 diabetes mellitus without complications: Secondary | ICD-10-CM | POA: Diagnosis not present

## 2022-12-17 DIAGNOSIS — Z9189 Other specified personal risk factors, not elsewhere classified: Secondary | ICD-10-CM | POA: Diagnosis not present

## 2022-12-17 DIAGNOSIS — I959 Hypotension, unspecified: Secondary | ICD-10-CM | POA: Diagnosis not present

## 2022-12-17 DIAGNOSIS — R0989 Other specified symptoms and signs involving the circulatory and respiratory systems: Secondary | ICD-10-CM | POA: Diagnosis not present

## 2022-12-17 DIAGNOSIS — E440 Moderate protein-calorie malnutrition: Secondary | ICD-10-CM | POA: Diagnosis not present

## 2022-12-17 DIAGNOSIS — R109 Unspecified abdominal pain: Secondary | ICD-10-CM | POA: Diagnosis not present

## 2022-12-17 DIAGNOSIS — Z79899 Other long term (current) drug therapy: Secondary | ICD-10-CM | POA: Diagnosis not present

## 2022-12-31 DEATH — deceased

## 2023-01-27 ENCOUNTER — Ambulatory Visit: Payer: Medicaid Other | Admitting: Radiation Oncology

## 2023-01-27 ENCOUNTER — Ambulatory Visit
Admission: RE | Admit: 2023-01-27 | Discharge: 2023-01-27 | Disposition: A | Discharge status: Home / Self Care | Payer: Medicaid Other | Source: Ambulatory Visit | Attending: Radiation Oncology | Admitting: Radiation Oncology

## 2023-01-27 DIAGNOSIS — C541 Malignant neoplasm of endometrium: Secondary | ICD-10-CM

## 2023-05-09 IMAGING — DX DG CHEST 2V
2 series · 2 of 2 positions shown · non-contrast
Comparison: 11/18/2020

CLINICAL DATA: 74-year-old female with pneumonia

EXAM:
CHEST - 2 VIEW

[dg chest 2 view (1 of 2)]
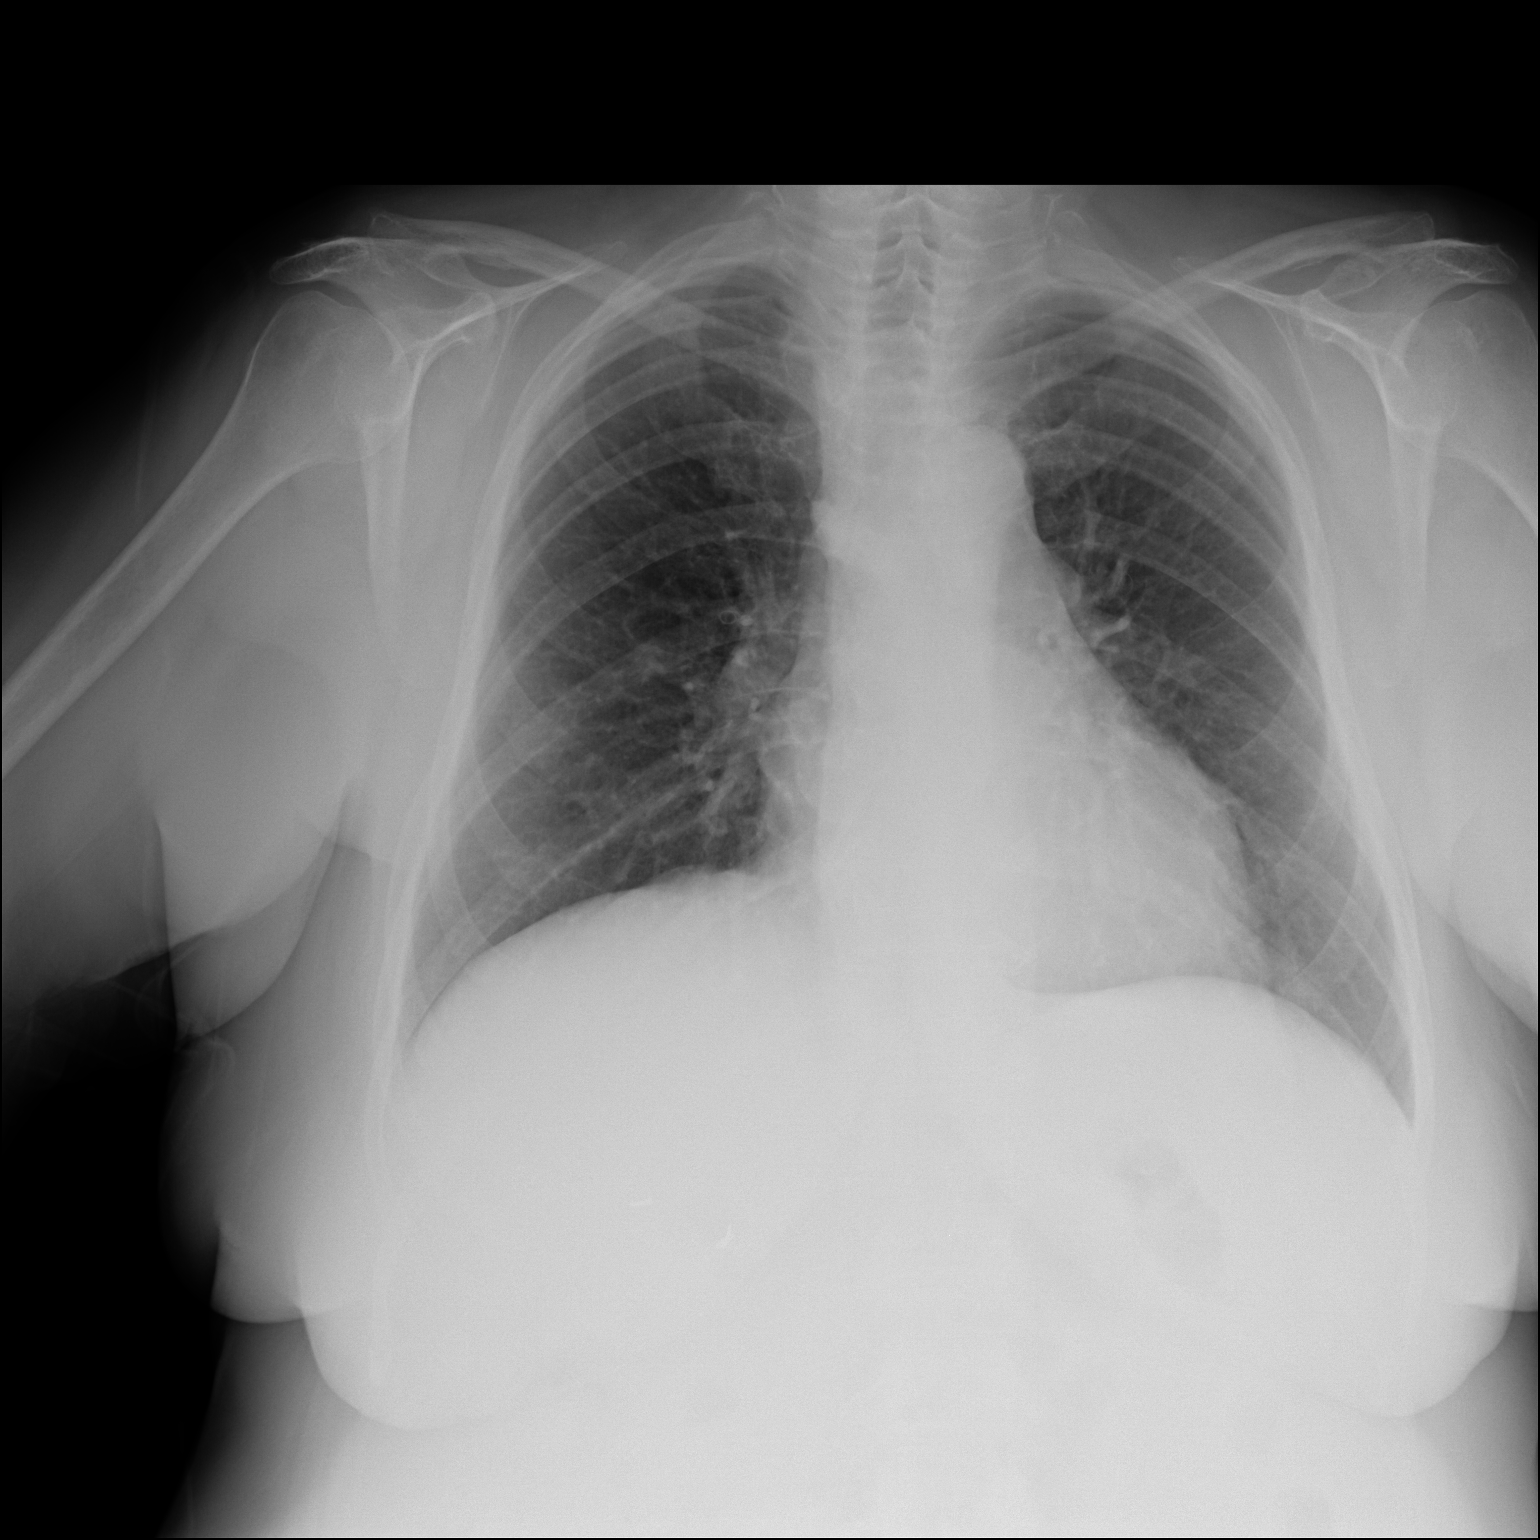

[dg chest 2 view (2 of 2)]
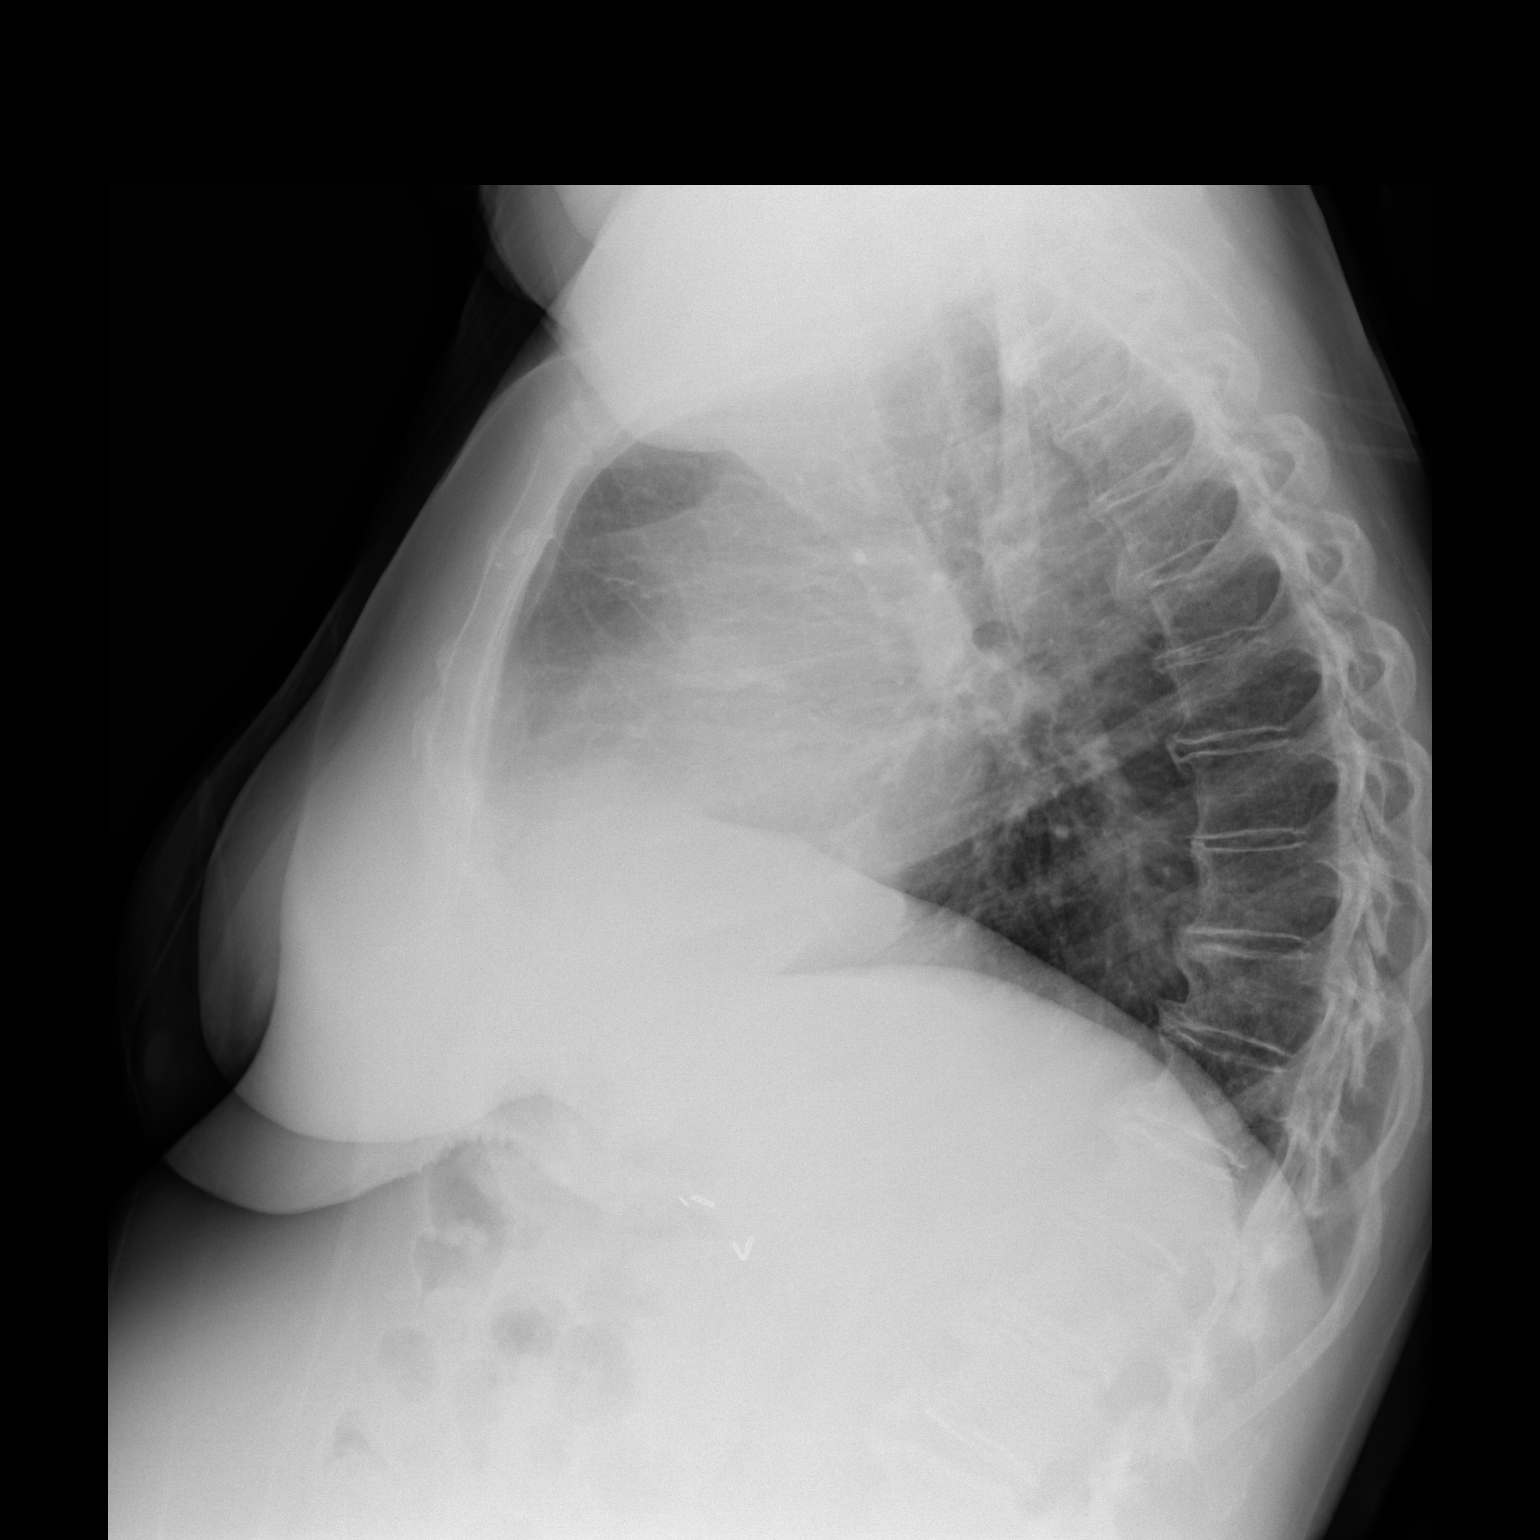

[2 of 2 positions shown; findings below may reference images not displayed]

FINDINGS: Cardiomediastinal silhouette unchanged in size and contour. No
evidence of central vascular congestion. No interlobular septal
thickening.

Improved aeration compared to the prior plain film, with resolution
of prior interstitial/airspace disease

No pneumothorax or pleural effusion. Coarsened interstitial
markings, with no new confluent airspace disease.

No acute displaced fracture. Degenerative changes of the spine.
IMPRESSION: Improved aeration of the lungs, with resolution of previous
left-sided interstitial/airspace disease
